# Patient Record
Sex: Female | Born: 1945 | Race: White | Hispanic: No | Marital: Single | Smoking: Former smoker
Health system: Southern US, Community
[De-identification: ages and names within clinical notes are randomized; demographics above are authoritative.]

## PROBLEM LIST (undated history)

## (undated) DIAGNOSIS — M51369 Other intervertebral disc degeneration, lumbar region without mention of lumbar back pain or lower extremity pain: Secondary | ICD-10-CM

## (undated) DIAGNOSIS — Z8719 Personal history of other diseases of the digestive system: Secondary | ICD-10-CM

## (undated) DIAGNOSIS — J309 Allergic rhinitis, unspecified: Secondary | ICD-10-CM

## (undated) DIAGNOSIS — M5136 Other intervertebral disc degeneration, lumbar region: Secondary | ICD-10-CM

## (undated) DIAGNOSIS — H35039 Hypertensive retinopathy, unspecified eye: Secondary | ICD-10-CM

## (undated) DIAGNOSIS — I499 Cardiac arrhythmia, unspecified: Secondary | ICD-10-CM

## (undated) DIAGNOSIS — E7800 Pure hypercholesterolemia, unspecified: Secondary | ICD-10-CM

## (undated) DIAGNOSIS — I4891 Unspecified atrial fibrillation: Secondary | ICD-10-CM

## (undated) DIAGNOSIS — M0520 Rheumatoid vasculitis with rheumatoid arthritis of unspecified site: Secondary | ICD-10-CM

## (undated) DIAGNOSIS — F419 Anxiety disorder, unspecified: Secondary | ICD-10-CM

## (undated) DIAGNOSIS — Z8489 Family history of other specified conditions: Secondary | ICD-10-CM

## (undated) DIAGNOSIS — R011 Cardiac murmur, unspecified: Secondary | ICD-10-CM

## (undated) DIAGNOSIS — I00 Rheumatic fever without heart involvement: Secondary | ICD-10-CM

## (undated) DIAGNOSIS — M549 Dorsalgia, unspecified: Secondary | ICD-10-CM

## (undated) DIAGNOSIS — H269 Unspecified cataract: Secondary | ICD-10-CM

## (undated) DIAGNOSIS — Z8711 Personal history of peptic ulcer disease: Secondary | ICD-10-CM

## (undated) DIAGNOSIS — I779 Disorder of arteries and arterioles, unspecified: Secondary | ICD-10-CM

## (undated) DIAGNOSIS — D649 Anemia, unspecified: Secondary | ICD-10-CM

## (undated) DIAGNOSIS — G4730 Sleep apnea, unspecified: Secondary | ICD-10-CM

## (undated) DIAGNOSIS — I739 Peripheral vascular disease, unspecified: Secondary | ICD-10-CM

## (undated) DIAGNOSIS — I2510 Atherosclerotic heart disease of native coronary artery without angina pectoris: Secondary | ICD-10-CM

## (undated) DIAGNOSIS — I10 Essential (primary) hypertension: Secondary | ICD-10-CM

## (undated) HISTORY — DX: Other intervertebral disc degeneration, lumbar region: M51.36

## (undated) HISTORY — DX: Hypertensive retinopathy, unspecified eye: H35.039

## (undated) HISTORY — DX: Anemia, unspecified: D64.9

## (undated) HISTORY — DX: Rheumatic fever without heart involvement: I00

## (undated) HISTORY — DX: Personal history of other diseases of the digestive system: Z87.19

## (undated) HISTORY — DX: Atherosclerotic heart disease of native coronary artery without angina pectoris: I25.10

## (undated) HISTORY — DX: Personal history of peptic ulcer disease: Z87.11

## (undated) HISTORY — DX: Rheumatoid vasculitis with rheumatoid arthritis of unspecified site: M05.20

## (undated) HISTORY — DX: Allergic rhinitis, unspecified: J30.9

## (undated) HISTORY — DX: Essential (primary) hypertension: I10

## (undated) HISTORY — DX: Dorsalgia, unspecified: M54.9

## (undated) HISTORY — DX: Unspecified cataract: H26.9

## (undated) HISTORY — DX: Other intervertebral disc degeneration, lumbar region without mention of lumbar back pain or lower extremity pain: M51.369

## (undated) HISTORY — DX: Pure hypercholesterolemia, unspecified: E78.00

## (undated) HISTORY — PX: TONSILLECTOMY: SUR1361

---
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
## 2004-10-05 HISTORY — PX: CHOLECYSTECTOMY: SHX55

## 2005-10-05 HISTORY — PX: HERNIA REPAIR: SHX51

## 2013-10-05 HISTORY — PX: HERNIA REPAIR: SHX51

## 2014-10-05 HISTORY — PX: REPLACEMENT TOTAL KNEE: SUR1224

## 2015-12-02 HISTORY — PX: REPLACEMENT TOTAL KNEE: SUR1224

## 2016-11-12 DIAGNOSIS — Z87891 Personal history of nicotine dependence: Secondary | ICD-10-CM | POA: Diagnosis not present

## 2016-11-12 DIAGNOSIS — M79601 Pain in right arm: Secondary | ICD-10-CM | POA: Diagnosis not present

## 2016-11-12 DIAGNOSIS — I10 Essential (primary) hypertension: Secondary | ICD-10-CM | POA: Diagnosis not present

## 2016-11-12 DIAGNOSIS — M79621 Pain in right upper arm: Secondary | ICD-10-CM | POA: Diagnosis not present

## 2016-11-16 DIAGNOSIS — M7981 Nontraumatic hematoma of soft tissue: Secondary | ICD-10-CM | POA: Diagnosis not present

## 2016-11-16 DIAGNOSIS — M79601 Pain in right arm: Secondary | ICD-10-CM | POA: Diagnosis not present

## 2016-11-18 DIAGNOSIS — M75121 Complete rotator cuff tear or rupture of right shoulder, not specified as traumatic: Secondary | ICD-10-CM | POA: Diagnosis not present

## 2016-11-18 DIAGNOSIS — M25511 Pain in right shoulder: Secondary | ICD-10-CM | POA: Diagnosis not present

## 2016-11-23 DIAGNOSIS — M25511 Pain in right shoulder: Secondary | ICD-10-CM | POA: Diagnosis not present

## 2016-11-23 DIAGNOSIS — M67411 Ganglion, right shoulder: Secondary | ICD-10-CM | POA: Diagnosis not present

## 2016-11-23 DIAGNOSIS — M75121 Complete rotator cuff tear or rupture of right shoulder, not specified as traumatic: Secondary | ICD-10-CM | POA: Diagnosis not present

## 2016-11-23 DIAGNOSIS — M19011 Primary osteoarthritis, right shoulder: Secondary | ICD-10-CM | POA: Diagnosis not present

## 2016-11-23 DIAGNOSIS — S46811A Strain of other muscles, fascia and tendons at shoulder and upper arm level, right arm, initial encounter: Secondary | ICD-10-CM | POA: Diagnosis not present

## 2016-11-23 DIAGNOSIS — M25411 Effusion, right shoulder: Secondary | ICD-10-CM | POA: Diagnosis not present

## 2016-11-24 DIAGNOSIS — S46011D Strain of muscle(s) and tendon(s) of the rotator cuff of right shoulder, subsequent encounter: Secondary | ICD-10-CM | POA: Diagnosis not present

## 2016-11-24 DIAGNOSIS — S46111D Strain of muscle, fascia and tendon of long head of biceps, right arm, subsequent encounter: Secondary | ICD-10-CM | POA: Diagnosis not present

## 2016-11-24 DIAGNOSIS — M25511 Pain in right shoulder: Secondary | ICD-10-CM | POA: Diagnosis not present

## 2016-12-03 DIAGNOSIS — S46011D Strain of muscle(s) and tendon(s) of the rotator cuff of right shoulder, subsequent encounter: Secondary | ICD-10-CM | POA: Diagnosis not present

## 2016-12-03 DIAGNOSIS — M25511 Pain in right shoulder: Secondary | ICD-10-CM | POA: Diagnosis not present

## 2016-12-03 DIAGNOSIS — S46111D Strain of muscle, fascia and tendon of long head of biceps, right arm, subsequent encounter: Secondary | ICD-10-CM | POA: Diagnosis not present

## 2016-12-09 DIAGNOSIS — M25511 Pain in right shoulder: Secondary | ICD-10-CM | POA: Diagnosis not present

## 2016-12-09 DIAGNOSIS — S46111D Strain of muscle, fascia and tendon of long head of biceps, right arm, subsequent encounter: Secondary | ICD-10-CM | POA: Diagnosis not present

## 2016-12-09 DIAGNOSIS — S46011D Strain of muscle(s) and tendon(s) of the rotator cuff of right shoulder, subsequent encounter: Secondary | ICD-10-CM | POA: Diagnosis not present

## 2016-12-10 DIAGNOSIS — S46111D Strain of muscle, fascia and tendon of long head of biceps, right arm, subsequent encounter: Secondary | ICD-10-CM | POA: Diagnosis not present

## 2016-12-10 DIAGNOSIS — M25511 Pain in right shoulder: Secondary | ICD-10-CM | POA: Diagnosis not present

## 2016-12-10 DIAGNOSIS — S46011D Strain of muscle(s) and tendon(s) of the rotator cuff of right shoulder, subsequent encounter: Secondary | ICD-10-CM | POA: Diagnosis not present

## 2016-12-15 DIAGNOSIS — M25511 Pain in right shoulder: Secondary | ICD-10-CM | POA: Diagnosis not present

## 2016-12-15 DIAGNOSIS — S46011D Strain of muscle(s) and tendon(s) of the rotator cuff of right shoulder, subsequent encounter: Secondary | ICD-10-CM | POA: Diagnosis not present

## 2016-12-15 DIAGNOSIS — S46111D Strain of muscle, fascia and tendon of long head of biceps, right arm, subsequent encounter: Secondary | ICD-10-CM | POA: Diagnosis not present

## 2016-12-16 DIAGNOSIS — M75121 Complete rotator cuff tear or rupture of right shoulder, not specified as traumatic: Secondary | ICD-10-CM | POA: Diagnosis not present

## 2016-12-16 DIAGNOSIS — M25511 Pain in right shoulder: Secondary | ICD-10-CM | POA: Diagnosis not present

## 2016-12-17 DIAGNOSIS — M25511 Pain in right shoulder: Secondary | ICD-10-CM | POA: Diagnosis not present

## 2016-12-17 DIAGNOSIS — S46111D Strain of muscle, fascia and tendon of long head of biceps, right arm, subsequent encounter: Secondary | ICD-10-CM | POA: Diagnosis not present

## 2016-12-17 DIAGNOSIS — S46011D Strain of muscle(s) and tendon(s) of the rotator cuff of right shoulder, subsequent encounter: Secondary | ICD-10-CM | POA: Diagnosis not present

## 2016-12-25 DIAGNOSIS — M25511 Pain in right shoulder: Secondary | ICD-10-CM | POA: Diagnosis not present

## 2016-12-25 DIAGNOSIS — S46011D Strain of muscle(s) and tendon(s) of the rotator cuff of right shoulder, subsequent encounter: Secondary | ICD-10-CM | POA: Diagnosis not present

## 2016-12-25 DIAGNOSIS — S46111D Strain of muscle, fascia and tendon of long head of biceps, right arm, subsequent encounter: Secondary | ICD-10-CM | POA: Diagnosis not present

## 2016-12-29 DIAGNOSIS — M25511 Pain in right shoulder: Secondary | ICD-10-CM | POA: Diagnosis not present

## 2016-12-29 DIAGNOSIS — S46011D Strain of muscle(s) and tendon(s) of the rotator cuff of right shoulder, subsequent encounter: Secondary | ICD-10-CM | POA: Diagnosis not present

## 2016-12-29 DIAGNOSIS — S46111D Strain of muscle, fascia and tendon of long head of biceps, right arm, subsequent encounter: Secondary | ICD-10-CM | POA: Diagnosis not present

## 2016-12-31 DIAGNOSIS — M25511 Pain in right shoulder: Secondary | ICD-10-CM | POA: Diagnosis not present

## 2016-12-31 DIAGNOSIS — S46011D Strain of muscle(s) and tendon(s) of the rotator cuff of right shoulder, subsequent encounter: Secondary | ICD-10-CM | POA: Diagnosis not present

## 2016-12-31 DIAGNOSIS — S46111D Strain of muscle, fascia and tendon of long head of biceps, right arm, subsequent encounter: Secondary | ICD-10-CM | POA: Diagnosis not present

## 2017-01-06 DIAGNOSIS — M25511 Pain in right shoulder: Secondary | ICD-10-CM | POA: Diagnosis not present

## 2017-01-06 DIAGNOSIS — S46111D Strain of muscle, fascia and tendon of long head of biceps, right arm, subsequent encounter: Secondary | ICD-10-CM | POA: Diagnosis not present

## 2017-01-06 DIAGNOSIS — S46011D Strain of muscle(s) and tendon(s) of the rotator cuff of right shoulder, subsequent encounter: Secondary | ICD-10-CM | POA: Diagnosis not present

## 2017-01-08 DIAGNOSIS — S46011D Strain of muscle(s) and tendon(s) of the rotator cuff of right shoulder, subsequent encounter: Secondary | ICD-10-CM | POA: Diagnosis not present

## 2017-01-08 DIAGNOSIS — M25511 Pain in right shoulder: Secondary | ICD-10-CM | POA: Diagnosis not present

## 2017-01-08 DIAGNOSIS — S46111D Strain of muscle, fascia and tendon of long head of biceps, right arm, subsequent encounter: Secondary | ICD-10-CM | POA: Diagnosis not present

## 2017-01-12 DIAGNOSIS — S46111D Strain of muscle, fascia and tendon of long head of biceps, right arm, subsequent encounter: Secondary | ICD-10-CM | POA: Diagnosis not present

## 2017-01-12 DIAGNOSIS — S46011D Strain of muscle(s) and tendon(s) of the rotator cuff of right shoulder, subsequent encounter: Secondary | ICD-10-CM | POA: Diagnosis not present

## 2017-01-12 DIAGNOSIS — M25511 Pain in right shoulder: Secondary | ICD-10-CM | POA: Diagnosis not present

## 2017-01-14 DIAGNOSIS — M25511 Pain in right shoulder: Secondary | ICD-10-CM | POA: Diagnosis not present

## 2017-01-14 DIAGNOSIS — S46011D Strain of muscle(s) and tendon(s) of the rotator cuff of right shoulder, subsequent encounter: Secondary | ICD-10-CM | POA: Diagnosis not present

## 2017-01-14 DIAGNOSIS — S46111D Strain of muscle, fascia and tendon of long head of biceps, right arm, subsequent encounter: Secondary | ICD-10-CM | POA: Diagnosis not present

## 2017-01-19 DIAGNOSIS — S46011D Strain of muscle(s) and tendon(s) of the rotator cuff of right shoulder, subsequent encounter: Secondary | ICD-10-CM | POA: Diagnosis not present

## 2017-01-19 DIAGNOSIS — M25511 Pain in right shoulder: Secondary | ICD-10-CM | POA: Diagnosis not present

## 2017-01-19 DIAGNOSIS — S46111D Strain of muscle, fascia and tendon of long head of biceps, right arm, subsequent encounter: Secondary | ICD-10-CM | POA: Diagnosis not present

## 2017-01-21 DIAGNOSIS — S46111D Strain of muscle, fascia and tendon of long head of biceps, right arm, subsequent encounter: Secondary | ICD-10-CM | POA: Diagnosis not present

## 2017-01-21 DIAGNOSIS — S46011D Strain of muscle(s) and tendon(s) of the rotator cuff of right shoulder, subsequent encounter: Secondary | ICD-10-CM | POA: Diagnosis not present

## 2017-01-21 DIAGNOSIS — M25511 Pain in right shoulder: Secondary | ICD-10-CM | POA: Diagnosis not present

## 2017-01-26 DIAGNOSIS — S46011D Strain of muscle(s) and tendon(s) of the rotator cuff of right shoulder, subsequent encounter: Secondary | ICD-10-CM | POA: Diagnosis not present

## 2017-01-26 DIAGNOSIS — S46111D Strain of muscle, fascia and tendon of long head of biceps, right arm, subsequent encounter: Secondary | ICD-10-CM | POA: Diagnosis not present

## 2017-01-26 DIAGNOSIS — M25511 Pain in right shoulder: Secondary | ICD-10-CM | POA: Diagnosis not present

## 2017-02-08 DIAGNOSIS — M25511 Pain in right shoulder: Secondary | ICD-10-CM | POA: Diagnosis not present

## 2017-02-08 DIAGNOSIS — S46111D Strain of muscle, fascia and tendon of long head of biceps, right arm, subsequent encounter: Secondary | ICD-10-CM | POA: Diagnosis not present

## 2017-02-08 DIAGNOSIS — S46011D Strain of muscle(s) and tendon(s) of the rotator cuff of right shoulder, subsequent encounter: Secondary | ICD-10-CM | POA: Diagnosis not present

## 2017-02-15 DIAGNOSIS — S46111D Strain of muscle, fascia and tendon of long head of biceps, right arm, subsequent encounter: Secondary | ICD-10-CM | POA: Diagnosis not present

## 2017-02-15 DIAGNOSIS — S46011D Strain of muscle(s) and tendon(s) of the rotator cuff of right shoulder, subsequent encounter: Secondary | ICD-10-CM | POA: Diagnosis not present

## 2017-02-15 DIAGNOSIS — M25511 Pain in right shoulder: Secondary | ICD-10-CM | POA: Diagnosis not present

## 2017-02-19 DIAGNOSIS — J019 Acute sinusitis, unspecified: Secondary | ICD-10-CM | POA: Diagnosis not present

## 2017-02-24 DIAGNOSIS — I10 Essential (primary) hypertension: Secondary | ICD-10-CM | POA: Diagnosis not present

## 2017-02-24 DIAGNOSIS — M75101 Unspecified rotator cuff tear or rupture of right shoulder, not specified as traumatic: Secondary | ICD-10-CM | POA: Diagnosis not present

## 2017-02-24 DIAGNOSIS — R635 Abnormal weight gain: Secondary | ICD-10-CM | POA: Diagnosis not present

## 2017-02-24 DIAGNOSIS — E785 Hyperlipidemia, unspecified: Secondary | ICD-10-CM | POA: Diagnosis not present

## 2017-02-24 DIAGNOSIS — R011 Cardiac murmur, unspecified: Secondary | ICD-10-CM | POA: Diagnosis not present

## 2017-02-24 DIAGNOSIS — Z1231 Encounter for screening mammogram for malignant neoplasm of breast: Secondary | ICD-10-CM | POA: Diagnosis not present

## 2017-03-03 DIAGNOSIS — I10 Essential (primary) hypertension: Secondary | ICD-10-CM | POA: Diagnosis not present

## 2017-03-03 DIAGNOSIS — R635 Abnormal weight gain: Secondary | ICD-10-CM | POA: Diagnosis not present

## 2017-03-03 DIAGNOSIS — E785 Hyperlipidemia, unspecified: Secondary | ICD-10-CM | POA: Diagnosis not present

## 2017-03-04 DIAGNOSIS — R062 Wheezing: Secondary | ICD-10-CM | POA: Diagnosis not present

## 2017-03-04 DIAGNOSIS — J019 Acute sinusitis, unspecified: Secondary | ICD-10-CM | POA: Diagnosis not present

## 2017-03-04 DIAGNOSIS — J209 Acute bronchitis, unspecified: Secondary | ICD-10-CM | POA: Diagnosis not present

## 2017-03-17 DIAGNOSIS — S01451A Open bite of right cheek and temporomandibular area, initial encounter: Secondary | ICD-10-CM | POA: Diagnosis not present

## 2017-03-17 DIAGNOSIS — W540XXA Bitten by dog, initial encounter: Secondary | ICD-10-CM | POA: Diagnosis not present

## 2017-03-17 DIAGNOSIS — S0083XA Contusion of other part of head, initial encounter: Secondary | ICD-10-CM | POA: Diagnosis not present

## 2017-03-20 DIAGNOSIS — S01451D Open bite of right cheek and temporomandibular area, subsequent encounter: Secondary | ICD-10-CM | POA: Diagnosis not present

## 2017-03-20 DIAGNOSIS — S0083XD Contusion of other part of head, subsequent encounter: Secondary | ICD-10-CM | POA: Diagnosis not present

## 2017-03-20 DIAGNOSIS — W540XXD Bitten by dog, subsequent encounter: Secondary | ICD-10-CM | POA: Diagnosis not present

## 2017-05-19 DIAGNOSIS — J028 Acute pharyngitis due to other specified organisms: Secondary | ICD-10-CM | POA: Diagnosis not present

## 2017-05-19 DIAGNOSIS — M545 Low back pain: Secondary | ICD-10-CM | POA: Diagnosis not present

## 2017-05-19 DIAGNOSIS — J069 Acute upper respiratory infection, unspecified: Secondary | ICD-10-CM | POA: Diagnosis not present

## 2017-06-16 DIAGNOSIS — Z23 Encounter for immunization: Secondary | ICD-10-CM | POA: Diagnosis not present

## 2017-09-20 DIAGNOSIS — R030 Elevated blood-pressure reading, without diagnosis of hypertension: Secondary | ICD-10-CM | POA: Diagnosis not present

## 2017-09-20 DIAGNOSIS — D508 Other iron deficiency anemias: Secondary | ICD-10-CM | POA: Diagnosis not present

## 2017-09-20 DIAGNOSIS — I10 Essential (primary) hypertension: Secondary | ICD-10-CM | POA: Diagnosis not present

## 2017-09-20 DIAGNOSIS — E7800 Pure hypercholesterolemia, unspecified: Secondary | ICD-10-CM | POA: Diagnosis not present

## 2017-09-20 DIAGNOSIS — E669 Obesity, unspecified: Secondary | ICD-10-CM | POA: Diagnosis not present

## 2017-09-20 DIAGNOSIS — M158 Other polyosteoarthritis: Secondary | ICD-10-CM | POA: Diagnosis not present

## 2017-09-20 DIAGNOSIS — D649 Anemia, unspecified: Secondary | ICD-10-CM | POA: Diagnosis not present

## 2017-10-06 ENCOUNTER — Other Ambulatory Visit: Payer: Self-pay | Admitting: Family Medicine

## 2017-10-06 DIAGNOSIS — Z1231 Encounter for screening mammogram for malignant neoplasm of breast: Secondary | ICD-10-CM

## 2017-10-26 ENCOUNTER — Encounter (INDEPENDENT_AMBULATORY_CARE_PROVIDER_SITE_OTHER): Payer: Self-pay

## 2017-10-26 ENCOUNTER — Ambulatory Visit
Admission: RE | Admit: 2017-10-26 | Discharge: 2017-10-26 | Disposition: A | Discharge status: Home / Self Care | Payer: Medicare Other | Source: Ambulatory Visit | Attending: Family Medicine | Admitting: Family Medicine

## 2017-10-26 DIAGNOSIS — Z1231 Encounter for screening mammogram for malignant neoplasm of breast: Secondary | ICD-10-CM

## 2017-11-24 DIAGNOSIS — E7800 Pure hypercholesterolemia, unspecified: Secondary | ICD-10-CM | POA: Diagnosis not present

## 2017-11-24 DIAGNOSIS — I10 Essential (primary) hypertension: Secondary | ICD-10-CM | POA: Diagnosis not present

## 2017-11-24 DIAGNOSIS — D563 Thalassemia minor: Secondary | ICD-10-CM | POA: Diagnosis not present

## 2017-11-24 DIAGNOSIS — M158 Other polyosteoarthritis: Secondary | ICD-10-CM | POA: Diagnosis not present

## 2018-01-31 ENCOUNTER — Ambulatory Visit
Admission: RE | Admit: 2018-01-31 | Discharge: 2018-01-31 | Disposition: A | Discharge status: Home / Self Care | Payer: Medicare Other | Source: Ambulatory Visit | Attending: Otolaryngology | Admitting: Otolaryngology

## 2018-01-31 ENCOUNTER — Other Ambulatory Visit: Payer: Self-pay | Admitting: Otolaryngology

## 2018-01-31 DIAGNOSIS — J320 Chronic maxillary sinusitis: Secondary | ICD-10-CM

## 2018-03-30 ENCOUNTER — Encounter: Payer: Self-pay | Admitting: Allergy and Immunology

## 2018-03-30 ENCOUNTER — Ambulatory Visit: Payer: Medicare Other | Admitting: Allergy and Immunology

## 2018-03-30 VITALS — BP 150/80 | HR 64 | Temp 97.9°F | Resp 16 | Ht 62.0 in | Wt 225.8 lb

## 2018-03-30 DIAGNOSIS — J3089 Other allergic rhinitis: Secondary | ICD-10-CM

## 2018-03-30 DIAGNOSIS — B999 Unspecified infectious disease: Secondary | ICD-10-CM | POA: Diagnosis not present

## 2018-03-30 NOTE — Patient Instructions (Addendum)
  1.  Allergen avoidance measures  2.  Treat and prevent inflammation:   A.  OTC Nasacort - 1 spray each nostril 1 time per day.  B.  Montelukast 10 mg - 1tablet 1 time per day  3.  If needed:   A.  Nasal saline  B.  OTC antihistamine -Allerest  D. OTC Mucinex DM - 1-2 tablets 2 times per day  4.  Blood - CBC w/diff, IgA/G/M  5. Further evaluation? Yes, if we recurrent problems  6. Return to clinic in 4 weeks or earlier if problem  7. Obtain fall flu vaccine

## 2018-03-30 NOTE — Progress Notes (Signed)
Dear Dr. Ernesto Rutherford,  Thank you for referring Kristina [name] to the [facility] on 03/30/2018.   Below is a summation of this patient's evaluation and recommendations.  Thank you for your referral. I will keep you informed about this patient's response to treatment.   If you have any questions please do not hesitate to contact me.   Sincerely,  Jiles Prows, MD Allergy / Immunology [facility]   ______________________________________________________________________    NEW PATIENT NOTE  Referring Provider: Thornell Sartorius, MD Primary Provider: Kathyrn Lass, MD Date of office visit: 03/30/2018    Subjective:   Chief Complaint:  Kristina [name] (DOB: 1946-04-27) is a 72 y.o. female who presents to the clinic on 03/30/2018 with a chief complaint of Sinus Problem (constant nasal congestion with recurrent sinus inf.) .     HPI: [name] presents to this clinic in evaluation of recurrent respiratory tract infections.  She informs me that she has had problems with "sinus" defined as extreme stuffiness and nasal congestion and ugly nasal discharge and a decreased ability to smell and coughing and chest tightness that can last several weeks requiring the administration of an antibiotic.  She has about 4 of these episodes per year.  In between these episodes she is chronically stuffy and has sneezing.  She does not appear to have a chronic cough or a significant amount of postnasal drip or throat clearing or raspy voice or chronic anosmia.  There is no obvious provoking factor giving rise to her respiratory tract symptoms.  She did smoke from the age of 41 to the age of 23 at 101 pack/day.  Chronic therapy has included the administration of over-the-counter Allerest and in the past she has use Claritin and Zyrtec.  She did have a history of heartburn in the past but that has resolved and she no  longer requires any therapy for this issue.  Past Medical History:  Diagnosis Date  . High blood pressure   . High cholesterol     Past Surgical History:  Procedure Laterality Date  . APPENDECTOMY  1955  . CHOLECYSTECTOMY  1963  . HERNIA REPAIR  2007  . HERNIA REPAIR  2015  . REPLACEMENT TOTAL KNEE  2016  . TONSILLECTOMY      Allergies as of 03/30/2018      Reactions   Penicillins Anaphylaxis, Itching      Medication List      ALLEREST MAXIMUM STRENGTH PO Take 1 tablet by mouth daily as needed.   hydrochlorothiazide 25 MG tablet Commonly known as:  HYDRODIURIL Take 25 mg by mouth daily.   losartan 100 MG tablet Commonly known as:  COZAAR Take 100 mg by mouth daily.   pravastatin 10 MG tablet Commonly known as:  PRAVACHOL Take 10 mg by mouth daily.       Review of systems negative except as noted in HPI / PMHx or noted below:  Review of Systems  Constitutional: Negative.   HENT: Negative.   Eyes: Negative.   Respiratory: Negative.   Cardiovascular: Negative.   Gastrointestinal: Negative.   Genitourinary: Negative.   Musculoskeletal: Negative.   Skin: Negative.   Neurological: Negative.   Endo/Heme/Allergies: Negative.   Psychiatric/Behavioral: Negative.     Family History  Problem Relation Age of Onset  . Kidney disease Mother   . Emphysema Father   . Asthma Father   . COPD Brother  Social History   Socioeconomic History  . Marital status: Unknown    Spouse name: Not on file  . Number of children: Not on file  . Years of education: Not on file  . Highest education level: Not on file  Occupational History  . Not on file  Social Needs  . Financial resource strain: Not on file  . Food insecurity:    Worry: Not on file    Inability: Not on file  . Transportation needs:    Medical: Not on file    Non-medical: Not on file  Tobacco Use  . Smoking status: Former Smoker    Packs/day: 1.50    Years: 30.00    Pack years: 45.00     Types: Cigarettes    Last attempt to quit: 2009    Years since quitting: 10.4  . Smokeless tobacco: Never Used  Substance and Sexual Activity  . Alcohol use: Yes    Comment: social  . Drug use: Never  . Sexual activity: Not on file  Lifestyle  . Physical activity:    Days per week: Not on file    Minutes per session: Not on file  . Stress: Not on file  Relationships  . Social connections:    Talks on phone: Not on file    Gets together: Not on file    Attends religious service: Not on file    Active member of club or organization: Not on file    Attends meetings of clubs or organizations: Not on file    Relationship status: Not on file  . Intimate partner violence:    Fear of current or ex partner: Not on file    Emotionally abused: Not on file    Physically abused: Not on file    Forced sexual activity: Not on file  Other Topics Concern  . Not on file  Social History Narrative  . Not on file    Environmental and Social history  Lives in a apartment with a dry environment, no animals located inside the household, dog exposure at her daughter's house and friends house, carpet in the bedroom, plastic on the bed, plastic on the pillow, and no smoking ongoing with inside the household.  She is a retired Engineer, materials.  Objective:   Vitals:   03/30/18 0931  BP: (!) 150/80  Pulse: 64  Resp: 16  Temp: 97.9 F (36.6 C)   Height: 5\' 2"  (157.5 cm) Weight: 225 lb 12.8 oz (102.4 kg)  Physical Exam  HENT:  Head: Normocephalic.  Right Ear: Tympanic membrane, external ear and ear canal normal.  Left Ear: Tympanic membrane, external ear and ear canal normal.  Nose: Mucosal edema present. No rhinorrhea.  Mouth/Throat: Uvula is midline, oropharynx is clear and moist and mucous membranes are normal. No oropharyngeal exudate.  Eyes: Conjunctivae are normal.  Neck: Trachea normal. No tracheal tenderness present. No tracheal deviation present. No thyromegaly present.   Cardiovascular: Normal rate, regular rhythm, S1 normal and S2 normal.  Murmur (Systolic murmur) heard. Pulmonary/Chest: Breath sounds normal. No stridor. No respiratory distress. She has no wheezes. She has no rales.  Musculoskeletal: She exhibits no edema.  Lymphadenopathy:       Head (right side): No tonsillar adenopathy present.       Head (left side): No tonsillar adenopathy present.    She has no cervical adenopathy.  Neurological: She is alert.  Skin: No rash noted. She is not diaphoretic. No erythema. Nails show no  clubbing.    Diagnostics: Allergy skin tests were performed.  She demonstrated hypersensitivity to house dust mite and cockroach  Spirometry was performed and demonstrated an FEV1 of 2.07 @ 103 % of predicted. FEV1/FVC = 0.76    Results of a sinus x-ray obtained 31 January 2018 identified the following:  There is increased density in the left maxillary sinus consistent with mucoperiosteal thickening. The additional visualized paranasal sinuses appear clear. No definite air-fluid levels or bone destruction. Multilevel cervical spondylosis noted.  Assessment and Plan:    1. Other allergic rhinitis   2. Recurrent infections     1.  Allergen avoidance measures  2.  Treat and prevent inflammation:   A.  OTC Nasacort - 1 spray each nostril 1 time per day.  B.  Montelukast 10 mg - 1tablet 1 time per day  3.  If needed:   A.  Nasal saline  B.  OTC antihistamine -Allerest  D. OTC Mucinex DM - 1-2 tablets 2 times per day  4.  Blood - CBC w/diff, IgA/G/M  5. Further evaluation? Yes, if we recurrent problems  6. Return to clinic in 4 weeks or earlier if problem  7. Obtain fall flu vaccine  I will have [name] perform allergen avoidance measures against dust mite and have her use anti-inflammatory agents for her airway in a preventative manner and screen her blood to make sure that she can produce adequate classes of immunoglobulin and I will see her back in this  clinic in 4 weeks to assess her response to this approach.  She will contact me during the interval should there be a problem.  Jiles Prows, MD Allergy / Immunology [facility]

## 2018-03-31 ENCOUNTER — Encounter: Payer: Self-pay | Admitting: Allergy and Immunology

## 2018-03-31 MED ORDER — MONTELUKAST SODIUM 10 MG PO TABS: 10.0000 mg | ORAL_TABLET | Freq: Every day | ORAL | 5 refills | Status: DC

## 2018-04-01 NOTE — Addendum Note (Signed)
Addended by: Isabel Caprice on: 04/01/2018 09:27 AM   Modules accepted: Orders

## 2018-04-02 LAB — CBC WITH DIFFERENTIAL/PLATELET
BASOS ABS: 0 10*3/uL (ref 0.0–0.2)
Basos: 0 %
EOS (ABSOLUTE): 0.3 10*3/uL (ref 0.0–0.4)
EOS: 5 %
HEMOGLOBIN: 11.5 g/dL (ref 11.1–15.9)
Hematocrit: 37.5 % (ref 34.0–46.6)
Immature Grans (Abs): 0 10*3/uL (ref 0.0–0.1)
Immature Granulocytes: 0 %
LYMPHS ABS: 2.2 10*3/uL (ref 0.7–3.1)
Lymphs: 36 %
MCH: 20.1 pg — ABNORMAL LOW (ref 26.6–33.0)
MCHC: 30.7 g/dL — AB (ref 31.5–35.7)
MCV: 66 fL — ABNORMAL LOW (ref 79–97)
MONOCYTES: 12 %
MONOS ABS: 0.8 10*3/uL (ref 0.1–0.9)
Neutrophils Absolute: 2.9 10*3/uL (ref 1.4–7.0)
Neutrophils: 47 %
Platelets: 292 10*3/uL (ref 150–450)
RBC: 5.71 x10E6/uL — ABNORMAL HIGH (ref 3.77–5.28)
RDW: 18.6 % — AB (ref 12.3–15.4)
WBC: 6.2 10*3/uL (ref 3.4–10.8)

## 2018-04-02 LAB — IGG, IGA, IGM
IgA/Immunoglobulin A, Serum: 380 mg/dL (ref 64–422)
IgG (Immunoglobin G), Serum: 906 mg/dL (ref 700–1600)
IgM (Immunoglobulin M), Srm: 46 mg/dL (ref 26–217)

## 2018-05-03 ENCOUNTER — Ambulatory Visit: Payer: Medicare Other | Admitting: Allergy and Immunology

## 2018-05-03 ENCOUNTER — Encounter: Payer: Self-pay | Admitting: Allergy and Immunology

## 2018-05-03 VITALS — BP 148/72 | HR 60 | Resp 16

## 2018-05-03 DIAGNOSIS — J3089 Other allergic rhinitis: Secondary | ICD-10-CM | POA: Diagnosis not present

## 2018-05-03 DIAGNOSIS — B999 Unspecified infectious disease: Secondary | ICD-10-CM | POA: Diagnosis not present

## 2018-05-03 NOTE — Patient Instructions (Addendum)
  1.  Continue to perform Allergen avoidance measures  2.  Continue to Treat and prevent inflammation:   A.  OTC Nasacort - 1 spray each nostril 3-7 times per week  B.  Montelukast 10 mg - 1 tablet 1 time per day  3.  If needed:   A.  Nasal saline  B.  OTC antihistamine -Allerest  D. OTC Mucinex DM - 1-2 tablets 2 times per day  4. Return to clinic in 1 year or earlier if problem  5. Obtain fall flu vaccine

## 2018-05-03 NOTE — Progress Notes (Signed)
Follow-up Note  Referring Provider: Kathyrn Lass, MD Primary Provider: Kathyrn Lass, MD Date of Office Visit: 05/03/2018  Subjective:   Kristina [name] (DOB: 30-Jul-1946) is a 72 y.o. female who returns to the [facility] on 05/03/2018 in re-evaluation of the following:  HPI: Kristina [name] returns to this clinic in reevaluation of her allergic rhinitis and recurrent infections addressed during her initial evaluation of 30 March 2018.  She has performed house dust avoidance measures and has been consistently using Nasacort and montelukast and feels great.  All of her upper airway symptoms have completely abated.  Allergies as of 05/03/2018      Reactions   Penicillins Anaphylaxis, Itching      Medication List      ALLEREST MAXIMUM STRENGTH PO Take 1 tablet by mouth daily as needed.   hydrochlorothiazide 25 MG tablet Commonly known as:  HYDRODIURIL Take 25 mg by mouth daily.   losartan 100 MG tablet Commonly known as:  COZAAR Take 100 mg by mouth daily.   montelukast 10 MG tablet Commonly known as:  SINGULAIR Take 1 tablet (10 mg total) by mouth at bedtime.   NASACORT ALLERGY 24HR 55 MCG/ACT Aero nasal inhaler Generic drug:  triamcinolone Place 1 spray into the nose daily.   pravastatin 10 MG tablet Commonly known as:  PRAVACHOL Take 10 mg by mouth daily.       Past Medical History:  Diagnosis Date  . Allergic rhinitis   . High blood pressure   . High cholesterol     Past Surgical History:  Procedure Laterality Date  . APPENDECTOMY  1955  . CHOLECYSTECTOMY  1963  . HERNIA REPAIR  2007  . HERNIA REPAIR  2015  . REPLACEMENT TOTAL KNEE  2016  . TONSILLECTOMY      Review of systems negative except as noted in HPI / PMHx or noted below:  Review of Systems  Constitutional: Negative.   HENT: Negative.   Eyes: Negative.   Respiratory: Negative.   Cardiovascular: Negative.   Gastrointestinal: Negative.   Genitourinary: Negative.     Musculoskeletal: Negative.   Skin: Negative.   Neurological: Negative.   Endo/Heme/Allergies: Negative.   Psychiatric/Behavioral: Negative.      Objective:   Vitals:   05/03/18 1028  BP: (!) 148/72  Pulse: 60  Resp: 16          Physical Exam  HENT:  Head: Normocephalic.  Right Ear: Tympanic membrane, external ear and ear canal normal.  Left Ear: Tympanic membrane, external ear and ear canal normal.  Nose: Nose normal. No mucosal edema or rhinorrhea.  Mouth/Throat: Uvula is midline, oropharynx is clear and moist and mucous membranes are normal. No oropharyngeal exudate.  Eyes: Conjunctivae are normal.  Neck: Trachea normal. No tracheal tenderness present. No tracheal deviation present. No thyromegaly present.  Cardiovascular: Normal rate, regular rhythm, S1 normal, S2 normal and normal heart sounds.  No murmur heard. Pulmonary/Chest: Breath sounds normal. No stridor. No respiratory distress. She has no wheezes. She has no rales.  Musculoskeletal: She exhibits no edema.  Lymphadenopathy:       Head (right side): No tonsillar adenopathy present.       Head (left side): No tonsillar adenopathy present.    She has no cervical adenopathy.  Neurological: She is alert.  Skin: No rash noted. She is not diaphoretic. No erythema. Nails show no clubbing.    Diagnostics:   Results of blood tests obtained 01 April 2018 identify IgG 906  mg/DL, IgM 46 mg/DL, IgA 380 mg/DL, WBC 6.2, absolute eosinophil 300, absolute lymphocyte 2200, hemoglobin 11.5, platelet 292.  Assessment and Plan:   1. Other allergic rhinitis   2. Recurrent infections      1.  Continue to perform Allergen avoidance measures  2.  Continue to Treat and prevent inflammation:   A.  OTC Nasacort - 1 spray each nostril 3-7 times per week  B.  Montelukast 10 mg - 1 tablet 1 time per day  3.  If needed:   A.  Nasal saline  B.  OTC antihistamine -Allerest  D. OTC Mucinex DM - 1-2 tablets 2 times per day  4.  Return to clinic in 1 year or earlier if problem  5. Obtain fall flu vaccine  Overall Kristina [name] is doing very well with a combination of allergen avoidance measures and the use of anti-inflammatory agents for her respiratory tract.  I will now see her back in this clinic in a year or earlier if there is a problem.  [name], MD Allergy / Immunology [place]

## 2018-05-04 ENCOUNTER — Encounter: Payer: Self-pay | Admitting: Allergy and Immunology

## 2018-10-12 ENCOUNTER — Other Ambulatory Visit: Payer: Self-pay | Admitting: Allergy and Immunology

## 2018-10-12 MED ORDER — MONTELUKAST SODIUM 10 MG PO TABS: 10.0000 mg | ORAL_TABLET | Freq: Every day | ORAL | 6 refills | Status: DC

## 2018-10-12 NOTE — Telephone Encounter (Signed)
Patient called pharmacy for a refill for Montelukast, but it was refused. She was seen 05-03-2018. She stated Dr. Neldon Mc said she only had to come once a year, so she doesn't know why it was denied. She is requesting a refill. [place] [address]

## 2018-10-12 NOTE — Telephone Encounter (Signed)
Refilled Montelukast x6 to [facility].

## 2018-10-13 ENCOUNTER — Other Ambulatory Visit: Payer: Self-pay | Admitting: Family Medicine

## 2018-10-13 DIAGNOSIS — Z1231 Encounter for screening mammogram for malignant neoplasm of breast: Secondary | ICD-10-CM

## 2018-11-09 ENCOUNTER — Ambulatory Visit
Admission: RE | Admit: 2018-11-09 | Discharge: 2018-11-09 | Disposition: A | Discharge status: Home / Self Care | Payer: Medicare Other | Source: Ambulatory Visit | Attending: Family Medicine | Admitting: Family Medicine

## 2018-11-09 DIAGNOSIS — Z1231 Encounter for screening mammogram for malignant neoplasm of breast: Secondary | ICD-10-CM

## 2018-11-21 DIAGNOSIS — M549 Dorsalgia, unspecified: Secondary | ICD-10-CM

## 2018-11-21 DIAGNOSIS — G8929 Other chronic pain: Secondary | ICD-10-CM | POA: Insufficient documentation

## 2018-12-01 ENCOUNTER — Encounter (INDEPENDENT_AMBULATORY_CARE_PROVIDER_SITE_OTHER): Payer: Medicare Other

## 2018-12-14 ENCOUNTER — Encounter (INDEPENDENT_AMBULATORY_CARE_PROVIDER_SITE_OTHER): Payer: Self-pay | Admitting: Bariatrics

## 2018-12-15 ENCOUNTER — Encounter (INDEPENDENT_AMBULATORY_CARE_PROVIDER_SITE_OTHER): Payer: Self-pay | Admitting: Bariatrics

## 2018-12-15 ENCOUNTER — Ambulatory Visit (INDEPENDENT_AMBULATORY_CARE_PROVIDER_SITE_OTHER): Payer: Medicare Other | Admitting: Bariatrics

## 2018-12-15 ENCOUNTER — Other Ambulatory Visit: Payer: Self-pay

## 2018-12-15 VITALS — BP 152/80 | HR 67 | Temp 97.9°F | Ht 62.0 in | Wt 222.0 lb

## 2018-12-15 DIAGNOSIS — R0602 Shortness of breath: Secondary | ICD-10-CM | POA: Diagnosis not present

## 2018-12-15 DIAGNOSIS — Z6841 Body Mass Index (BMI) 40.0 and over, adult: Secondary | ICD-10-CM

## 2018-12-15 DIAGNOSIS — Z9189 Other specified personal risk factors, not elsewhere classified: Secondary | ICD-10-CM

## 2018-12-15 DIAGNOSIS — M549 Dorsalgia, unspecified: Secondary | ICD-10-CM

## 2018-12-15 DIAGNOSIS — R5383 Other fatigue: Secondary | ICD-10-CM | POA: Diagnosis not present

## 2018-12-15 DIAGNOSIS — E559 Vitamin D deficiency, unspecified: Secondary | ICD-10-CM

## 2018-12-15 DIAGNOSIS — E7849 Other hyperlipidemia: Secondary | ICD-10-CM

## 2018-12-15 DIAGNOSIS — R7309 Other abnormal glucose: Secondary | ICD-10-CM

## 2018-12-15 DIAGNOSIS — Z1331 Encounter for screening for depression: Secondary | ICD-10-CM | POA: Diagnosis not present

## 2018-12-15 DIAGNOSIS — I10 Essential (primary) hypertension: Secondary | ICD-10-CM | POA: Diagnosis not present

## 2018-12-15 DIAGNOSIS — G8929 Other chronic pain: Secondary | ICD-10-CM

## 2018-12-15 DIAGNOSIS — Z0289 Encounter for other administrative examinations: Secondary | ICD-10-CM

## 2018-12-15 NOTE — Progress Notes (Signed)
.  Office: [phone]  /  Fax: [phone]   HPI:   Chief Complaint: OBESITY  Kristina [name] (MR# [id number]) is a 73 y.o. female who presents on 12/15/2018 for obesity evaluation and treatment. Current BMI is Body mass index is 40.6 kg/m.[name] has struggled with obesity for years and has been unsuccessful in either losing weight or maintaining long term weight loss. Armandina attended our information session and states she is currently in the action stage of change and ready to dedicate time achieving and maintaining a healthier weight.  [name] states her family eats meals together her desired weight loss is 62 lbs. she has been heavy most of her life she started gaining weight in 2017 her heaviest weight ever was 341 lbs. she does like to cook she craves sweets she does not like "[place]" she describes her worst habit as over eating  she snacks frequently in the evenings she is frequently drinking liquids with calories she sometimes makes poor food choices she sometimes eats larger portions than normal  she struggles with emotional eating    [name] feels her energy is lower than it should be. This has worsened with weight gain and has not worsened recently. [name] admits to daytime somnolence and she admits to waking up still tired. Patient is at risk for obstructive sleep apnea. Patent has a history of symptoms of daytime fatigue, morning fatigue and hypertension. Patient generally gets 5 or 6 hours of sleep per night, and states they generally have restless sleep. Snoring is present. Apneic episodes are not present. Epworth Sleepiness Score is 7  Dyspnea on exertion [name] notes increasing shortness of breath with certain activities (climbing stairs) and seems to be worsening over time with weight gain. She notes getting out of breath sooner with activity than she used to. This has not gotten worse recently. [name] denies orthopnea.   Hypertension [name] is a 73 y.o. female with hypertension. She is currently taking Losartan and HCTZ. She did not take her blood pressure medications. [name] denies chest pain. She is working weight loss to help control her blood pressure with the goal of decreasing her risk of heart attack and stroke. [name] blood pressure is not well controlled.  Hyperlipidemia [name] has hyperlipidemia and she is currently taking pravastatin and pravachol. She is attempting to improve her cholesterol levels with intensive lifestyle modification including a low saturated fat diet, exercise and weight loss. She denies myalgias.  At risk for cardiovascular disease [name] is at a higher than average risk for cardiovascular disease due to obesity, hypertension and hyperlipidemia. She currently denies any chest pain.  Chronic Back Pain [name] has chronic back pain. Her chronic back pain is "not severe per patient". She plans to get epidural injection next week.  Vitamin D deficiency [name] has a diagnosis of vitamin D deficiency. She is currently taking multi-vitamin, B12 supplement, vitamin D supplement and vitamin C supplement. She denies nausea, vomiting or muscle weakness.  Elevated Glucose  [name] has a history of some elevated blood glucose readings without a diagnosis of diabetes. She admits to polyphagia.  Depression Screen [name]'s Food and Mood (modified PHQ-9) score was  Depression screen PHQ 2/9 12/15/2018  Decreased Interest 2  Down, Depressed, Hopeless 1  PHQ - 2 Score 3  Tired, decreased energy 2  Change in appetite 1  Feeling bad or failure about yourself  1  Trouble concentrating 1  Moving slowly or fidgety/restless 0  Suicidal thoughts 0  Difficult doing  work/chores Not difficult at all    ASSESSMENT AND PLAN:  Other fatigue  SOB (shortness of breath) on exertion  Essential hypertension - Plan: Comprehensive metabolic panel  Other  hyperlipidemia - Plan: Hemoglobin A1c, Insulin, random, Lipid Panel With LDL/HDL Ratio  Chronic back pain, unspecified back location, unspecified back pain laterality  Vitamin D deficiency - Plan: VITAMIN D 25 Hydroxy (Vit-D Deficiency, Fractures)  Elevated glucose - Plan: Hemoglobin A1c, Insulin, random  Depression screening  At risk for heart disease  Class 3 severe obesity with serious comorbidity and body mass index (BMI) of 40.0 to 44.9 in adult, unspecified obesity type (HCC)  PLAN:  Fatigue [name] was informed that her fatigue may be related to obesity, depression or many other causes. Labs will be ordered, and in the meanwhile [name] has agreed to work on diet, exercise and weight loss to help with fatigue. Proper sleep hygiene was discussed including the need for 7-8 hours of quality sleep each night. A sleep study was not ordered based on symptoms and Epworth score.  Dyspnea on exertion [name]'s shortness of breath appears to be obesity related and exercise induced. She has agreed to work on weight loss and gradually increase exercise to treat her exercise induced shortness of breath. [name] will do no pounding exercises. If [name] follows our instructions and loses weight without improvement of her shortness of breath, we will plan to refer to pulmonology. We will monitor this condition regularly. [name] agrees to this plan.  Hypertension We discussed sodium restriction, working on healthy weight loss, and a regular exercise program as the means to achieve improved blood pressure control. [name] agreed with this plan and agreed to follow up as directed. We will continue to monitor her blood pressure as well as her progress with the above lifestyle modifications. She will continue her medications as prescribed and will watch for signs of hypotension as she continues her lifestyle modifications.  Hyperlipidemia Dannia was informed of the American Heart  Association Guidelines emphasizing intensive lifestyle modifications as the first line treatment for hyperlipidemia. We discussed many lifestyle modifications today in depth, and [name] will start to work on decreasing saturated fats such as fatty red meat, butter and many fried foods. She will also increase vegetables and lean protein in her diet and start to work on exercise and weight loss efforts. Vivica will continue her medications and follow up with our clinic at the agreed upon time.  Cardiovascular risk counseling Annaliah was given extended (15 minutes) coronary artery disease prevention counseling today. She is 73 y.o. female and has risk factors for heart disease including obesity, hypertension and hyperlipidemia. We discussed intensive lifestyle modifications today with an emphasis on specific weight loss instructions and strategies. Pt was also informed of the importance of increasing exercise and decreasing saturated fats to help prevent heart disease.  Chronic Back Pain Kyannah will gradually increase exercise to help with her chronic back pain. She agrees to follow up with our clinic in 2 weeks.  Vitamin D Deficiency Kihanna was informed that low vitamin D levels contributes to fatigue and are associated with obesity, breast, and colon cancer. She will continue her multi-vitamin and vitamin B12, vit D and vitamin C supplements and will follow up for routine testing of vitamin D, at least 2-3 times per year. She was informed of the risk of over-replacement of vitamin D and agrees to not increase her dose unless she discusses this with [name] first. We will check vitamin D level today.  Elevated Glucose Fasting labs will be obtained (Hgb A1c, insulin level) and results with be discussed with [name] in 2 weeks at her follow up visit. In the meanwhile Kadeshia was started on a lower simple carbohydrate diet and will work on weight loss efforts.  Depression Screen Paticia had  a mildly positive depression screening. Depression is commonly associated with obesity and often results in emotional eating behaviors. We will monitor this closely and work on CBT to help improve the non-hunger eating patterns. Referral to Psychology may be required if no improvement is seen as she continues in our clinic.  Obesity Emmilyn is currently in the action stage of change and her goal is to continue with weight loss efforts She has agreed to follow the Category 3 plan Lilyona will go to the gym at her apartment complex to exercise for weight loss and overall health benefits. We discussed the following Behavioral Modification Strategies today: increase H2O intake, no skipping meals, keeping healthy foods in the home, increasing lean protein intake, decreasing simple carbohydrates, increasing vegetables, decrease eating out and work on meal planning and easy cooking plans  Matteson has agreed to follow up with our clinic in 2 weeks. She was informed of the importance of frequent follow up visits to maximize her success with intensive lifestyle modifications for her multiple health conditions. She was informed we would discuss her lab results at her next visit unless there is a critical issue that needs to be addressed sooner. Manjot agreed to keep her next visit at the agreed upon time to discuss these results.  ALLERGIES: Allergies  Allergen Reactions  . Penicillins Anaphylaxis and Itching    MEDICATIONS: Current Outpatient Medications on File Prior to Visit  Medication Sig Dispense Refill  . cyclobenzaprine (FLEXERIL) 10 MG tablet Take 10 mg by mouth 3 (three) times daily as needed for muscle spasms.    . hydrochlorothiazide (HYDRODIURIL) 25 MG tablet Take 25 mg by mouth daily.    [name] losartan (COZAAR) 100 MG tablet Take 100 mg by mouth daily.    . montelukast (SINGULAIR) 10 MG tablet Take 1 tablet (10 mg total) by mouth at bedtime. 30 tablet 6  . Multiple Vitamin  (MULTIVITAMIN) capsule Take 1 capsule by mouth daily.    . Multiple Vitamins-Minerals (AIRBORNE GUMMIES PO) Take by mouth.    . pravastatin (PRAVACHOL) 10 MG tablet Take 10 mg by mouth daily.    . Chlorpheniramine-Pseudoeph (ALLEREST MAXIMUM STRENGTH PO) Take 1 tablet by mouth daily as needed.    . triamcinolone (NASACORT ALLERGY 24HR) 55 MCG/ACT AERO nasal inhaler Place 1 spray into the nose daily.     No current facility-administered medications on file prior to visit.     PAST MEDICAL HISTORY: Past Medical History:  Diagnosis Date  . Allergic rhinitis   . Anemia glaucoma  . Back pain   . DDD (degenerative disc disease), lumbar   . High blood pressure   . High cholesterol   . History of stomach ulcers   . Rheumatic fever   . Rheumatoid arteritis ([place])     PAST SURGICAL HISTORY: Past Surgical History:  Procedure Laterality Date  . APPENDECTOMY  1955  . CHOLECYSTECTOMY  1963  . HERNIA REPAIR  2007  . HERNIA REPAIR  2015  . REPLACEMENT TOTAL KNEE  2016  . TONSILLECTOMY      SOCIAL HISTORY: Social History   Tobacco Use  . Smoking status: Former Smoker    Packs/day: 1.50    Years: 30.00  Pack years: 45.00    Types: Cigarettes    Last attempt to quit: 2009    Years since quitting: 11.2  . Smokeless tobacco: Never Used  Substance Use Topics  . Alcohol use: Yes    Comment: social  . Drug use: Never    FAMILY HISTORY: Family History  Problem Relation Age of Onset  . Kidney disease Mother   . Hypertension Mother   . Emphysema Father   . Asthma Father   . Hypertension Father   . Heart disease Father   . COPD Brother     ROS: Review of Systems  Constitutional: Positive for malaise/fatigue.  HENT: Positive for congestion (nasal stuffiness).        + Dentures  Eyes:       + Wear Glasses or Contacts + Floaters  Respiratory: Positive for shortness of breath (on exertion).   Cardiovascular: Negative for chest pain and orthopnea.       + Leg Cramping   Gastrointestinal: Positive for heartburn. Negative for nausea and vomiting.  Genitourinary: Positive for frequency.  Musculoskeletal: Positive for back pain.       + Neck Stiffness + Muscle or Joint Pain  Endo/Heme/Allergies: Bruises/bleeds easily (bruising).       + Excessive Hunger  Psychiatric/Behavioral: The patient has insomnia.     PHYSICAL EXAM: Blood pressure (!) 152/80, pulse 67, temperature 97.9 F (36.6 C), temperature source Oral, height 5\' 2"  (1.575 m), weight 222 lb (100.7 kg), SpO2 98 %. Body mass index is 40.6 kg/m. Physical Exam Vitals signs reviewed.  Constitutional:      Appearance: Normal appearance. She is well-developed. She is obese.  HENT:     Head: Normocephalic and atraumatic.     Nose: Nose normal.     Mouth/Throat:     Comments: Mallampati = 3 Eyes:     General: No scleral icterus.    Extraocular Movements: Extraocular movements intact.  Neck:     Musculoskeletal: Normal range of motion and neck supple.     Thyroid: No thyromegaly.  Cardiovascular:     Rate and Rhythm: Normal rate and regular rhythm.  Pulmonary:     Effort: Pulmonary effort is normal. No respiratory distress.  Abdominal:     Palpations: Abdomen is soft.     Tenderness: There is no abdominal tenderness.  Musculoskeletal: Normal range of motion.     Comments: Range of Motion normal in all 4 extremities  Skin:    General: Skin is warm and dry.  Neurological:     Mental Status: She is alert and oriented to person, place, and time.     Coordination: Coordination normal.  Psychiatric:        Mood and Affect: Mood normal.        Behavior: Behavior normal.     RECENT LABS AND TESTS: BMET No results found for: NA, K, CL, CO2, GLUCOSE, BUN, CREATININE, CALCIUM, GFRNONAA, GFRAA No results found for: HGBA1C No results found for: INSULIN CBC    Component Value Date/Time   WBC 6.2 04/01/2018 0857   RBC 5.71 (H) 04/01/2018 0857   HGB 11.5 04/01/2018 0857   HCT 37.5 04/01/2018  0857   PLT 292 04/01/2018 0857   MCV 66 (L) 04/01/2018 0857   MCH 20.1 (L) 04/01/2018 0857   MCHC 30.7 (L) 04/01/2018 0857   RDW 18.6 (H) 04/01/2018 0857   LYMPHSABS 2.2 04/01/2018 0857   EOSABS 0.3 04/01/2018 0857   BASOSABS 0.0 04/01/2018 0857   Iron/TIBC/Ferritin/ %  Sat No results found for: IRON, TIBC, FERRITIN, IRONPCTSAT Lipid Panel  No results found for: CHOL, TRIG, HDL, CHOLHDL, VLDL, LDLCALC, LDLDIRECT Hepatic Function Panel  No results found for: PROT, ALBUMIN, AST, ALT, ALKPHOS, BILITOT, BILIDIR, IBILI No results found for: TSH Vitamin D There are no recent lab results  INDIRECT CALORIMETER done today shows a VO2 of 292 and a REE of 2036. Her calculated basal metabolic rate is 3716 thus her basal metabolic rate is better than expected.   OBESITY BEHAVIORAL INTERVENTION VISIT  Today's visit was # 1   Starting weight: 222 lbs Starting date: 12/15/2018 Today's weight : 222 lbs Today's date: 12/15/2018 Total lbs lost to date: 0 At least 15 minutes were spent on discussing the following behavioral intervention visit.    12/15/2018  Height 5\' 2"  (1.575 m)  Weight 222 lb (100.7 kg)  BMI (Calculated) 40.59  BLOOD PRESSURE - SYSTOLIC 967  BLOOD PRESSURE - DIASTOLIC 80  Waist Measurement  43 inches   Body Fat % 52.5 %  Total Body Water (lbs) 80.4 lbs  RMR 2036    ASK: We discussed the diagnosis of obesity with [name] today and Annistyn agreed to give [name] permission to discuss obesity behavioral modification therapy today.  ASSESS: Klee has the diagnosis of obesity and her BMI today is 40.59 Nabeeha is in the action stage of change   ADVISE: Kenadi was educated on the multiple health risks of obesity as well as the benefit of weight loss to improve her health. She was advised of the need for long term treatment and the importance of lifestyle modifications to improve her current health and to decrease her risk of future health problems.   AGREE: Multiple dietary modification options and treatment options were discussed and  Evaluna agreed to follow the recommendations documented in the above note.  ARRANGE: Jeny was educated on the importance of frequent visits to treat obesity as outlined per CMS and USPSTF guidelines and agreed to schedule her next follow up appointment today.   Corey Skains, am acting as Location manager for [employer]. Owens Shark, DO  I have reviewed the above documentation for accuracy and completeness, and I agree with the above. -Jearld Lesch, DO

## 2018-12-16 LAB — COMPREHENSIVE METABOLIC PANEL
ALT: 24 IU/L (ref 0–32)
AST: 24 IU/L (ref 0–40)
Albumin/Globulin Ratio: 1.7 (ref 1.2–2.2)
Albumin: 4.3 g/dL (ref 3.7–4.7)
Alkaline Phosphatase: 70 IU/L (ref 39–117)
BUN/Creatinine Ratio: 11 — ABNORMAL LOW (ref 12–28)
BUN: 7 mg/dL — AB (ref 8–27)
Bilirubin Total: 0.5 mg/dL (ref 0.0–1.2)
CO2: 25 mmol/L (ref 20–29)
Calcium: 9.5 mg/dL (ref 8.7–10.3)
Chloride: 99 mmol/L (ref 96–106)
Creatinine, Ser: 0.63 mg/dL (ref 0.57–1.00)
GFR calc Af Amer: 104 mL/min/{1.73_m2} (ref >59)
GFR calc non Af Amer: 90 mL/min/{1.73_m2} (ref >59)
GLUCOSE: 94 mg/dL (ref 65–99)
Globulin, Total: 2.6 g/dL (ref 1.5–4.5)
Potassium: 4.6 mmol/L (ref 3.5–5.2)
Sodium: 140 mmol/L (ref 134–144)
Total Protein: 6.9 g/dL (ref 6.0–8.5)

## 2018-12-16 LAB — LIPID PANEL WITH LDL/HDL RATIO
CHOLESTEROL TOTAL: 223 mg/dL — AB (ref 100–199)
HDL: 60 mg/dL (ref >39)
LDL Calculated: 129 mg/dL — ABNORMAL HIGH (ref 0–99)
LDl/HDL Ratio: 2.2 ratio (ref 0.0–3.2)
Triglycerides: 170 mg/dL — ABNORMAL HIGH (ref 0–149)
VLDL Cholesterol Cal: 34 mg/dL (ref 5–40)

## 2018-12-16 LAB — HEMOGLOBIN A1C
Est. average glucose Bld gHb Est-mCnc: 111 mg/dL
HEMOGLOBIN A1C: 5.5 % (ref 4.8–5.6)

## 2018-12-16 LAB — INSULIN, RANDOM: INSULIN: 7.1 u[IU]/mL (ref 2.6–24.9)

## 2018-12-16 LAB — VITAMIN D 25 HYDROXY (VIT D DEFICIENCY, FRACTURES): Vit D, 25-Hydroxy: 31.8 ng/mL (ref 30.0–100.0)

## 2018-12-29 ENCOUNTER — Ambulatory Visit (INDEPENDENT_AMBULATORY_CARE_PROVIDER_SITE_OTHER): Payer: Medicare Other | Admitting: Bariatrics

## 2018-12-29 ENCOUNTER — Encounter (INDEPENDENT_AMBULATORY_CARE_PROVIDER_SITE_OTHER): Payer: Self-pay | Admitting: Bariatrics

## 2018-12-29 ENCOUNTER — Other Ambulatory Visit: Payer: Self-pay

## 2018-12-29 DIAGNOSIS — I10 Essential (primary) hypertension: Secondary | ICD-10-CM | POA: Diagnosis not present

## 2018-12-29 DIAGNOSIS — E7849 Other hyperlipidemia: Secondary | ICD-10-CM | POA: Diagnosis not present

## 2018-12-29 DIAGNOSIS — Z6841 Body Mass Index (BMI) 40.0 and over, adult: Secondary | ICD-10-CM

## 2018-12-29 DIAGNOSIS — E559 Vitamin D deficiency, unspecified: Secondary | ICD-10-CM | POA: Diagnosis not present

## 2018-12-29 MED ORDER — VITAMIN D (ERGOCALCIFEROL) 1.25 MG (50000 UNIT) PO CAPS: 50000.0000 [IU] | ORAL_CAPSULE | ORAL | 0 refills | Status: DC

## 2019-01-02 NOTE — Progress Notes (Addendum)
Office: [phone]  /  Fax: [phone] TeleHealth Visit:  Kristina [name] has consented to this TeleHealth visit today via telephone call. The patient is located at home, the provider is located at the [facility] office. The participants in this visit include the listed provider and patient and any and all parties involved.   HPI:   Chief Complaint: OBESITY Kristina [name] is here to discuss her progress with her obesity treatment plan. She is on the Category 3 plan and is following her eating plan approximately 100 % of the time. She states she is walking and biking for 30 minutes 4 times per week. Kristina [name] is doing well. She has a digital scale and according to her digital scale, she has lost 4 pounds. Kristina [name] states the plan works well. Kristina [name] has had a hard time getting bread. She was not hungry with the plan. She denies cravings, except for occasional sweets. We were unable to weight the patient today for this TeleHealth visit.She feels as if she has lost weight since her last visit. She has lost 4 lbs since starting treatment with [name].  Hypertension Kristina [name] is a 73 y.o. female with hypertension. She is currently taking HCTZ and Losartan. Her last blood pressure was at 152/80 Kristina [name] denies chest pain or shortness of breath on exertion. She is working weight loss to help control her blood pressure with the goal of decreasing her risk of heart attack and stroke. [name] blood pressure is not currently controlled.  Hyperlipidemia Kristina [name] has hyperlipidemia and she has been on medication for years. She is currently taking Pravastatin. Her total cholesterol is at 223, triglycerides are at 170 and LDL is at 129 (12/15/18). She has been trying to improve her cholesterol levels with intensive lifestyle modification including a low saturated fat diet, exercise and weight loss. She denies myalgias.  Vitamin D deficiency Kristina [name] has a diagnosis of  vitamin D deficiency. Her last vitamin D level was at 31.8 Kristina [name] is currently taking 1,000 IU vit D and she denies nausea, vomiting or muscle weakness.  ASSESSMENT AND PLAN:  Essential hypertension  Other hyperlipidemia  Vitamin D deficiency - Plan: Vitamin D, Ergocalciferol, (DRISDOL) 1.25 MG (50000 UT) CAPS capsule  Class 3 severe obesity with serious comorbidity and body mass index (BMI) of 40.0 to 44.9 in adult, unspecified obesity type ([place])  PLAN:  Hypertension We discussed sodium restriction, working on healthy weight loss, and a regular exercise program as the means to achieve improved blood pressure control. Kristina [name] agreed with this plan and agreed to follow up as directed. We will continue to monitor her blood pressure as well as her progress with the above lifestyle modifications. She will continue her medications as prescribed and will watch for signs of hypotension as she continues her lifestyle modifications.  Hyperlipidemia Kristina [name] was informed of the American Heart Association Guidelines emphasizing intensive lifestyle modifications as the first line treatment for hyperlipidemia. We discussed many lifestyle modifications today in depth, and Kristina [name] will continue to work on decreasing saturated fats such as fatty red meat, butter and many fried foods. She will also increase vegetables and lean protein in her diet and continue to work on exercise and weight loss efforts.  Vitamin D Deficiency Kristina [name] was informed that low vitamin D levels contributes to fatigue and are associated with obesity, breast, and colon cancer. She agrees to start prescription Vit D @50 ,000 IU every week #4 with no refills and will follow up for routine testing of vitamin D,  at least 2-3 times per year. She was informed of the risk of over-replacement of vitamin D and agrees to not increase her dose unless she discusses this with [name] first. Kristina [name] agrees to follow up as directed. We will  recheck vitamin D level in 3 months.  Obesity Kristina [name] is currently in the action stage of change. As such, her goal is to continue with weight loss efforts She has agreed to follow the Category 3 plan Kristina [name] will continue to walk for weight loss and overall health benefits. We discussed the following Behavioral Modification Strategies today: increase H2O intake, no skipping meals, keeping healthy foods in the home, increasing lean protein intake, decreasing simple carbohydrates, increasing vegetables, decrease eating out and work on meal planning and easy cooking plans Additional meal planning, options and help with alternate food options with grocery store shortages were discussed with our registered dietician via telephone call today. Additional lunch options were e-mailed to patient today.  Kristina [name] has agreed to follow up with our clinic in 2 weeks. She was informed of the importance of frequent follow up visits to maximize her success with intensive lifestyle modifications for her multiple health conditions.  ALLERGIES: Allergies  Allergen Reactions  . Penicillins Anaphylaxis and Itching    MEDICATIONS: Current Outpatient Medications on File Prior to Visit  Medication Sig Dispense Refill  . Chlorpheniramine-Pseudoeph (ALLEREST MAXIMUM STRENGTH PO) Take 1 tablet by mouth daily as needed.    . cyclobenzaprine (FLEXERIL) 10 MG tablet Take 10 mg by mouth 3 (three) times daily as needed for muscle spasms.    . hydrochlorothiazide (HYDRODIURIL) 25 MG tablet Take 25 mg by mouth daily.    [name] losartan (COZAAR) 100 MG tablet Take 100 mg by mouth daily.    . montelukast (SINGULAIR) 10 MG tablet Take 1 tablet (10 mg total) by mouth at bedtime. 30 tablet 6  . Multiple Vitamin (MULTIVITAMIN) capsule Take 1 capsule by mouth daily.    . Multiple Vitamins-Minerals (AIRBORNE GUMMIES PO) Take by mouth.    . pravastatin (PRAVACHOL) 10 MG tablet Take 10 mg by mouth daily.    [name] triamcinolone  (NASACORT ALLERGY 24HR) 55 MCG/ACT AERO nasal inhaler Place 1 spray into the nose daily.     No current facility-administered medications on file prior to visit.     PAST MEDICAL HISTORY: Past Medical History:  Diagnosis Date  . Allergic rhinitis   . Anemia glaucoma  . Back pain   . DDD (degenerative disc disease), lumbar   . High blood pressure   . High cholesterol   . History of stomach ulcers   . Rheumatic fever   . Rheumatoid arteritis ([place])     PAST SURGICAL HISTORY: Past Surgical History:  Procedure Laterality Date  . APPENDECTOMY  1955  . CHOLECYSTECTOMY  1963  . HERNIA REPAIR  2007  . HERNIA REPAIR  2015  . REPLACEMENT TOTAL KNEE  2016  . TONSILLECTOMY      SOCIAL HISTORY: Social History   Tobacco Use  . Smoking status: Former Smoker    Packs/day: 1.50    Years: 30.00    Pack years: 45.00    Types: Cigarettes    Last attempt to quit: 2009    Years since quitting: 11.2  . Smokeless tobacco: Never Used  Substance Use Topics  . Alcohol use: Yes    Comment: social  . Drug use: Never    FAMILY HISTORY: Family History  Problem Relation Age of Onset  . Kidney disease Mother   .  Hypertension Mother   . Emphysema Father   . Asthma Father   . Hypertension Father   . Heart disease Father   . COPD Brother     ROS: Review of Systems  Constitutional: Positive for weight loss.  Respiratory: Negative for shortness of breath (on exertion).   Cardiovascular: Negative for chest pain.  Gastrointestinal: Negative for nausea and vomiting.  Musculoskeletal: Negative for myalgias.       Negative for muscle weakness    PHYSICAL EXAM: Pt in no acute distress  RECENT LABS AND TESTS: BMET    Component Value Date/Time   NA 140 12/15/2018 1210   K 4.6 12/15/2018 1210   CL 99 12/15/2018 1210   CO2 25 12/15/2018 1210   GLUCOSE 94 12/15/2018 1210   BUN 7 (L) 12/15/2018 1210   CREATININE 0.63 12/15/2018 1210   CALCIUM 9.5 12/15/2018 1210   GFRNONAA 90  12/15/2018 1210   GFRAA 104 12/15/2018 1210   Lab Results  Component Value Date   HGBA1C 5.5 12/15/2018   Lab Results  Component Value Date   INSULIN 7.1 12/15/2018   CBC    Component Value Date/Time   WBC 6.2 04/01/2018 0857   RBC 5.71 (H) 04/01/2018 0857   HGB 11.5 04/01/2018 0857   HCT 37.5 04/01/2018 0857   PLT 292 04/01/2018 0857   MCV 66 (L) 04/01/2018 0857   MCH 20.1 (L) 04/01/2018 0857   MCHC 30.7 (L) 04/01/2018 0857   RDW 18.6 (H) 04/01/2018 0857   LYMPHSABS 2.2 04/01/2018 0857   EOSABS 0.3 04/01/2018 0857   BASOSABS 0.0 04/01/2018 0857   Iron/TIBC/Ferritin/ %Sat No results found for: IRON, TIBC, FERRITIN, IRONPCTSAT Lipid Panel     Component Value Date/Time   CHOL 223 (H) 12/15/2018 1210   TRIG 170 (H) 12/15/2018 1210   HDL 60 12/15/2018 1210   LDLCALC 129 (H) 12/15/2018 1210   Hepatic Function Panel     Component Value Date/Time   PROT 6.9 12/15/2018 1210   ALBUMIN 4.3 12/15/2018 1210   AST 24 12/15/2018 1210   ALT 24 12/15/2018 1210   ALKPHOS 70 12/15/2018 1210   BILITOT 0.5 12/15/2018 1210   No results found for: TSH   Ref. Range 12/15/2018 12:10  Vitamin D, 25-Hydroxy Latest Ref Range: 30.0 - 100.0 ng/mL 31.8     I, Doreene Nest, am acting as Location manager for [employer]. Owens Shark, DO  I have reviewed the above documentation for accuracy and completeness, and I agree with the above. -Jearld Lesch, DO

## 2019-01-12 ENCOUNTER — Encounter (INDEPENDENT_AMBULATORY_CARE_PROVIDER_SITE_OTHER): Payer: Self-pay | Admitting: Bariatrics

## 2019-01-12 ENCOUNTER — Other Ambulatory Visit: Payer: Self-pay

## 2019-01-12 ENCOUNTER — Ambulatory Visit (INDEPENDENT_AMBULATORY_CARE_PROVIDER_SITE_OTHER): Payer: Medicare Other | Admitting: Bariatrics

## 2019-01-12 DIAGNOSIS — I10 Essential (primary) hypertension: Secondary | ICD-10-CM

## 2019-01-12 DIAGNOSIS — E559 Vitamin D deficiency, unspecified: Secondary | ICD-10-CM

## 2019-01-12 DIAGNOSIS — Z6841 Body Mass Index (BMI) 40.0 and over, adult: Secondary | ICD-10-CM

## 2019-01-12 DIAGNOSIS — E66813 Obesity, class 3: Secondary | ICD-10-CM

## 2019-01-12 DIAGNOSIS — K5909 Other constipation: Secondary | ICD-10-CM | POA: Diagnosis not present

## 2019-01-16 NOTE — Progress Notes (Signed)
Office: [phone]  /  Fax: [phone] TeleHealth Visit:  Kristina [name] has verbally consented to this TeleHealth visit today. The patient is located at home, the provider is located at the [facility] office. The participants in this visit include the listed provider and patient and any and all parties involved. The visit was conducted today via WebEx.  HPI:   Chief Complaint: OBESITY Kristina [name] is here to discuss her progress with her obesity treatment plan. She is on the Category 3 plan and is following her eating plan approximately 100 % of the time. She states she is walking for 30 minutes 7 times per week and riding the stationary bike for 30 minutes 4 times per week. Kristina [name] states that she has lost 11 pounds since her last visit. Her anxiety has been high. Kristina [name] is hungry in the morning, but her appetite is controlled the rest of the day. We were unable to weigh the patient today for this TeleHealth visit. She feels as if she has lost weight since her last visit. She has lost 14 lbs since starting treatment with [name].  Hypertension Kristina [name] is a 73 y.o. female with hypertension. She is taking HCTZ. Her blood pressures are 124/67, 149/77 and 120/58. Megann Easterwood denies lightheadedness. She is working weight loss to help control her blood pressure with the goal of decreasing her risk of heart attack and stroke. [name] blood pressure is currently controlled.  Vitamin D deficiency Kristina [name] has a diagnosis of vitamin D deficiency. She is taking high dose vit D. Elayne denies nausea, vomiting or muscle weakness.  Constipation Kristina [name] notes constipation for the last few weeks, worse since attempting weight loss. She states BM are less frequent and are not hard and painful. She denies abdominal pain, nausea or vomiting. She is getting adequate water.  ASSESSMENT AND PLAN:  Essential hypertension  Vitamin D deficiency  Other constipation   Class 3 severe obesity with serious comorbidity and body mass index (BMI) of 40.0 to 44.9 in adult, unspecified obesity type ([place])  PLAN:  Hypertension We discussed sodium restriction, working on healthy weight loss, and a regular exercise program as the means to achieve improved blood pressure control. [name] agreed with this plan and she agreed to follow up in 2 weeks. We will continue to monitor her blood pressure as well as her progress with the above lifestyle modifications. She will continue her medications as prescribed and she will check her blood pressures at home. Her PCP decreased her medication in half to 12.5 mg.  Vitamin D Deficiency [name] was informed that low vitamin D levels contributes to fatigue and are associated with obesity, breast, and colon cancer. She agrees to continue to take prescription Vit D @50 ,000 IU every week and will follow up for routine testing of vitamin D, at least 2-3 times per year. She was informed of the risk of over-replacement of vitamin D and agrees to not increase her dose unless she discusses this with [name] first.  Constipation [name] was informed decrease bowel movement frequency is normal while losing weight, but stools should not be hard or painful. She was advised to increase her H20 intake (64+ ounces) and consider wheat bran, or flax seed. She agrees to begin OTC Miralax daily and follow up as directed.  Obesity Kristina [name] is currently in the action stage of change. As such, her goal is to continue with weight loss efforts She has agreed to follow the Category 3 plan Kristina [name] has been instructed to  work up to a goal of 150 minutes of combined cardio and strengthening exercise per week for weight loss and overall health benefits. We discussed the following Behavioral Modification Strategies today: increase H2O intake, no skipping meals, keeping healthy foods in the home, increasing lean protein intake, decreasing simple carbohydrates,  increasing vegetables, decrease eating out and work on meal planning and easy cooking plans Kristina [name] will weigh herself at home. We discussed alternate sources of protein.  Kristina [name] has agreed to follow up with our clinic in 2 weeks. She was informed of the importance of frequent follow up visits to maximize her success with intensive lifestyle modifications for her multiple health conditions.  ALLERGIES: Allergies  Allergen Reactions  . Penicillins Anaphylaxis and Itching    MEDICATIONS: Current Outpatient Medications on File Prior to Visit  Medication Sig Dispense Refill  . Chlorpheniramine-Pseudoeph (ALLEREST MAXIMUM STRENGTH PO) Take 1 tablet by mouth daily as needed.    . cyclobenzaprine (FLEXERIL) 10 MG tablet Take 10 mg by mouth 3 (three) times daily as needed for muscle spasms.    . hydrochlorothiazide (HYDRODIURIL) 25 MG tablet Take 25 mg by mouth daily.    [name] losartan (COZAAR) 100 MG tablet Take 100 mg by mouth daily.    . montelukast (SINGULAIR) 10 MG tablet Take 1 tablet (10 mg total) by mouth at bedtime. 30 tablet 6  . Multiple Vitamin (MULTIVITAMIN) capsule Take 1 capsule by mouth daily.    . Multiple Vitamins-Minerals (AIRBORNE GUMMIES PO) Take by mouth.    . pravastatin (PRAVACHOL) 10 MG tablet Take 10 mg by mouth daily.    [name] triamcinolone (NASACORT ALLERGY 24HR) 55 MCG/ACT AERO nasal inhaler Place 1 spray into the nose daily.    . Vitamin D, Ergocalciferol, (DRISDOL) 1.25 MG (50000 UT) CAPS capsule Take 1 capsule (50,000 Units total) by mouth every 7 (seven) days. 4 capsule 0   No current facility-administered medications on file prior to visit.     PAST MEDICAL HISTORY: Past Medical History:  Diagnosis Date  . Allergic rhinitis   . Anemia glaucoma  . Back pain   . DDD (degenerative disc disease), lumbar   . High blood pressure   . High cholesterol   . History of stomach ulcers   . Rheumatic fever   . Rheumatoid arteritis ([place])     PAST SURGICAL HISTORY:  Past Surgical History:  Procedure Laterality Date  . APPENDECTOMY  1955  . CHOLECYSTECTOMY  1963  . HERNIA REPAIR  2007  . HERNIA REPAIR  2015  . REPLACEMENT TOTAL KNEE  2016  . TONSILLECTOMY      SOCIAL HISTORY: Social History   Tobacco Use  . Smoking status: Former Smoker    Packs/day: 1.50    Years: 30.00    Pack years: 45.00    Types: Cigarettes    Last attempt to quit: 2009    Years since quitting: 11.2  . Smokeless tobacco: Never Used  Substance Use Topics  . Alcohol use: Yes    Comment: social  . Drug use: Never    FAMILY HISTORY: Family History  Problem Relation Age of Onset  . Kidney disease Mother   . Hypertension Mother   . Emphysema Father   . Asthma Father   . Hypertension Father   . Heart disease Father   . COPD Brother     ROS: Review of Systems  Constitutional: Positive for weight loss.  Gastrointestinal: Negative for abdominal pain, nausea and vomiting.  Musculoskeletal:  Negative for muscle weakness  Neurological:       Negative for lightheadedness    PHYSICAL EXAM: Pt in no acute distress  RECENT LABS AND TESTS: BMET    Component Value Date/Time   NA 140 12/15/2018 1210   K 4.6 12/15/2018 1210   CL 99 12/15/2018 1210   CO2 25 12/15/2018 1210   GLUCOSE 94 12/15/2018 1210   BUN 7 (L) 12/15/2018 1210   CREATININE 0.63 12/15/2018 1210   CALCIUM 9.5 12/15/2018 1210   GFRNONAA 90 12/15/2018 1210   GFRAA 104 12/15/2018 1210   Lab Results  Component Value Date   HGBA1C 5.5 12/15/2018   Lab Results  Component Value Date   INSULIN 7.1 12/15/2018   CBC    Component Value Date/Time   WBC 6.2 04/01/2018 0857   RBC 5.71 (H) 04/01/2018 0857   HGB 11.5 04/01/2018 0857   HCT 37.5 04/01/2018 0857   PLT 292 04/01/2018 0857   MCV 66 (L) 04/01/2018 0857   MCH 20.1 (L) 04/01/2018 0857   MCHC 30.7 (L) 04/01/2018 0857   RDW 18.6 (H) 04/01/2018 0857   LYMPHSABS 2.2 04/01/2018 0857   EOSABS 0.3 04/01/2018 0857   BASOSABS 0.0  04/01/2018 0857   Iron/TIBC/Ferritin/ %Sat No results found for: IRON, TIBC, FERRITIN, IRONPCTSAT Lipid Panel     Component Value Date/Time   CHOL 223 (H) 12/15/2018 1210   TRIG 170 (H) 12/15/2018 1210   HDL 60 12/15/2018 1210   LDLCALC 129 (H) 12/15/2018 1210   Hepatic Function Panel     Component Value Date/Time   PROT 6.9 12/15/2018 1210   ALBUMIN 4.3 12/15/2018 1210   AST 24 12/15/2018 1210   ALT 24 12/15/2018 1210   ALKPHOS 70 12/15/2018 1210   BILITOT 0.5 12/15/2018 1210   No results found for: TSH    Ref. Range 12/15/2018 12:10  Vitamin D, 25-Hydroxy Latest Ref Range: 30.0 - 100.0 ng/mL 31.8   I, Doreene Nest, am acting as Location manager for [employer]. Owens Shark, DO  I have reviewed the above documentation for accuracy and completeness, and I agree with the above. -Jearld Lesch, DO

## 2019-01-26 ENCOUNTER — Ambulatory Visit (INDEPENDENT_AMBULATORY_CARE_PROVIDER_SITE_OTHER): Payer: Medicare Other | Admitting: Bariatrics

## 2019-01-26 ENCOUNTER — Other Ambulatory Visit: Payer: Self-pay

## 2019-01-26 ENCOUNTER — Encounter (INDEPENDENT_AMBULATORY_CARE_PROVIDER_SITE_OTHER): Payer: Self-pay | Admitting: Bariatrics

## 2019-01-26 DIAGNOSIS — I10 Essential (primary) hypertension: Secondary | ICD-10-CM | POA: Diagnosis not present

## 2019-01-26 DIAGNOSIS — Z6841 Body Mass Index (BMI) 40.0 and over, adult: Secondary | ICD-10-CM

## 2019-01-26 DIAGNOSIS — E66813 Obesity, class 3: Secondary | ICD-10-CM

## 2019-01-26 DIAGNOSIS — E559 Vitamin D deficiency, unspecified: Secondary | ICD-10-CM

## 2019-01-26 MED ORDER — VITAMIN D (ERGOCALCIFEROL) 1.25 MG (50000 UNIT) PO CAPS: 50000.0000 [IU] | ORAL_CAPSULE | ORAL | 0 refills | Status: DC

## 2019-01-30 NOTE — Progress Notes (Signed)
Office: [phone]  /  Fax: [phone] TeleHealth Visit:  Kristina [name] has verbally consented to this TeleHealth visit today. The patient is located at home, the provider is located at the [employer] [facility] office. The participants in this visit include the listed provider and patient and any and all parties involved. The visit was conducted today via WebEx.  HPI:   Chief Complaint: OBESITY Kristina [name] is here to discuss her progress with her obesity treatment plan. She is on the Category 3 plan and is following her eating plan approximately 90 % of the time. She states she is walking (2 miles) for 55 minutes 7 times per week. Kristina [name] states that she has lost 7 pounds (209 lbs). She has had some stress and she was put on a mild anti-depressant which has helped. Kristina [name] is resting better. We were unable to weigh the patient today for this TeleHealth visit. She feels as if she has lost weight since her last visit. She has lost 13 lbs since starting treatment with Kristina [name].  Vitamin D deficiency [name] has a diagnosis of vitamin D deficiency. She is currently taking vit D and denies nausea, vomiting or muscle weakness.  Hypertension [name] is a 73 y.o. female with hypertension. Her PCP had decreased her blood pressure medications at the last visit. She is taking Cozaar and HCTZ. Her blood pressure is at [place] denies chest pain or shortness of breath on exertion. She is working weight loss to help control her blood pressure with the goal of decreasing her risk of heart attack and stroke. [name] blood pressure is well controlled.  ASSESSMENT AND PLAN:  Vitamin D deficiency - Plan: Vitamin D, Ergocalciferol, (DRISDOL) 1.25 MG (50000 UT) CAPS capsule  Essential hypertension  Class 3 severe obesity with serious comorbidity and body mass index (BMI) of 40.0 to 44.9 in adult, unspecified obesity type ([place])  PLAN:  Vitamin D Deficiency [name]  was informed that low vitamin D levels contributes to fatigue and are associated with obesity, breast, and colon cancer. She agrees to continue to take prescription Vit D @50 ,000 IU every week #4 with no refills and will follow up for routine testing of vitamin D, at least 2-3 times per year. She was informed of the risk of over-replacement of vitamin D and agrees to not increase her dose unless she discusses this with Kristina [name] first. [name] agrees to follow up as directed.  Hypertension We discussed sodium restriction, working on healthy weight loss, and a regular exercise program as the means to achieve improved blood pressure control. Kristina [name] agreed with this plan and agreed to follow up as directed. We will continue to monitor her blood pressure as well as her progress with the above lifestyle modifications. She will continue her medications as prescribed and will watch for signs of hypotension as she continues her lifestyle modifications.  Obesity Kristina [name] is currently in the action stage of change. As such, her goal is to continue with weight loss efforts She has agreed to follow the Category 3 plan Kristina [name] will continue walking 2 miles daily (goal 3 miles daily) for weight loss and overall health benefits. We discussed the following Behavioral Modification Strategies today: increase H2O intake, no skipping meals, keeping healthy foods in the home, increasing lean protein intake, decreasing simple carbohydrates, increasing vegetables, decrease eating out and work on meal planning and easy cooking plans Kristina [name] will weigh herself at home before each visit. She will spread out the protein as needed.  Kristina [name]  has agreed to follow up with our clinic in 2 weeks. She was informed of the importance of frequent follow up visits to maximize her success with intensive lifestyle modifications for her multiple health conditions.  ALLERGIES: Allergies  Allergen Reactions  . Penicillins Anaphylaxis  and Itching    MEDICATIONS: Current Outpatient Medications on File Prior to Visit  Medication Sig Dispense Refill  . Chlorpheniramine-Pseudoeph (ALLEREST MAXIMUM STRENGTH PO) Take 1 tablet by mouth daily as needed.    . cyclobenzaprine (FLEXERIL) 10 MG tablet Take 10 mg by mouth 3 (three) times daily as needed for muscle spasms.    . hydrochlorothiazide (HYDRODIURIL) 25 MG tablet Take 25 mg by mouth daily.    Kristina [name] losartan (COZAAR) 100 MG tablet Take 100 mg by mouth daily.    . montelukast (SINGULAIR) 10 MG tablet Take 1 tablet (10 mg total) by mouth at bedtime. 30 tablet 6  . Multiple Vitamin (MULTIVITAMIN) capsule Take 1 capsule by mouth daily.    . Multiple Vitamins-Minerals (AIRBORNE GUMMIES PO) Take by mouth.    . pravastatin (PRAVACHOL) 10 MG tablet Take 10 mg by mouth daily.    Kristina [name] triamcinolone (NASACORT ALLERGY 24HR) 55 MCG/ACT AERO nasal inhaler Place 1 spray into the nose daily.     No current facility-administered medications on file prior to visit.     PAST MEDICAL HISTORY: Past Medical History:  Diagnosis Date  . Allergic rhinitis   . Anemia glaucoma  . Back pain   . DDD (degenerative disc disease), lumbar   . High blood pressure   . High cholesterol   . History of stomach ulcers   . Rheumatic fever   . Rheumatoid arteritis ([place])     PAST SURGICAL HISTORY: Past Surgical History:  Procedure Laterality Date  . APPENDECTOMY  1955  . CHOLECYSTECTOMY  1963  . HERNIA REPAIR  2007  . HERNIA REPAIR  2015  . REPLACEMENT TOTAL KNEE  2016  . TONSILLECTOMY      SOCIAL HISTORY: Social History   Tobacco Use  . Smoking status: Former Smoker    Packs/day: 1.50    Years: 30.00    Pack years: 45.00    Types: Cigarettes    Last attempt to quit: 2009    Years since quitting: 11.3  . Smokeless tobacco: Never Used  Substance Use Topics  . Alcohol use: Yes    Comment: social  . Drug use: Never    FAMILY HISTORY: Family History  Problem Relation Age of Onset  .  Kidney disease Mother   . Hypertension Mother   . Emphysema Father   . Asthma Father   . Hypertension Father   . Heart disease Father   . COPD Brother     ROS: Review of Systems  Constitutional: Positive for weight loss.  Respiratory: Negative for shortness of breath (on exertion).   Cardiovascular: Negative for chest pain.  Gastrointestinal: Negative for nausea and vomiting.  Musculoskeletal:       Negative for muscle weakness    PHYSICAL EXAM: Pt in no acute distress  RECENT LABS AND TESTS: BMET    Component Value Date/Time   NA 140 12/15/2018 1210   K 4.6 12/15/2018 1210   CL 99 12/15/2018 1210   CO2 25 12/15/2018 1210   GLUCOSE 94 12/15/2018 1210   BUN 7 (L) 12/15/2018 1210   CREATININE 0.63 12/15/2018 1210   CALCIUM 9.5 12/15/2018 1210   GFRNONAA 90 12/15/2018 1210   GFRAA 104 12/15/2018 1210  Lab Results  Component Value Date   HGBA1C 5.5 12/15/2018   Lab Results  Component Value Date   INSULIN 7.1 12/15/2018   CBC    Component Value Date/Time   WBC 6.2 04/01/2018 0857   RBC 5.71 (H) 04/01/2018 0857   HGB 11.5 04/01/2018 0857   HCT 37.5 04/01/2018 0857   PLT 292 04/01/2018 0857   MCV 66 (L) 04/01/2018 0857   MCH 20.1 (L) 04/01/2018 0857   MCHC 30.7 (L) 04/01/2018 0857   RDW 18.6 (H) 04/01/2018 0857   LYMPHSABS 2.2 04/01/2018 0857   EOSABS 0.3 04/01/2018 0857   BASOSABS 0.0 04/01/2018 0857   Iron/TIBC/Ferritin/ %Sat No results found for: IRON, TIBC, FERRITIN, IRONPCTSAT Lipid Panel     Component Value Date/Time   CHOL 223 (H) 12/15/2018 1210   TRIG 170 (H) 12/15/2018 1210   HDL 60 12/15/2018 1210   LDLCALC 129 (H) 12/15/2018 1210   Hepatic Function Panel     Component Value Date/Time   PROT 6.9 12/15/2018 1210   ALBUMIN 4.3 12/15/2018 1210   AST 24 12/15/2018 1210   ALT 24 12/15/2018 1210   ALKPHOS 70 12/15/2018 1210   BILITOT 0.5 12/15/2018 1210   No results found for: TSH    Ref. Range 12/15/2018 12:10  Vitamin D, 25-Hydroxy  Latest Ref Range: 30.0 - 100.0 ng/mL 31.8   I, Doreene Nest, am acting as Location manager for [employer]. Owens Shark, DO  I have reviewed the above documentation for accuracy and completeness, and I agree with the above. -Jearld Lesch, DO

## 2019-02-09 ENCOUNTER — Encounter (INDEPENDENT_AMBULATORY_CARE_PROVIDER_SITE_OTHER): Payer: Self-pay | Admitting: Bariatrics

## 2019-02-09 ENCOUNTER — Other Ambulatory Visit: Payer: Self-pay

## 2019-02-09 ENCOUNTER — Ambulatory Visit (INDEPENDENT_AMBULATORY_CARE_PROVIDER_SITE_OTHER): Payer: Medicare Other | Admitting: Bariatrics

## 2019-02-09 DIAGNOSIS — E66813 Obesity, class 3: Secondary | ICD-10-CM

## 2019-02-09 DIAGNOSIS — I10 Essential (primary) hypertension: Secondary | ICD-10-CM | POA: Diagnosis not present

## 2019-02-09 DIAGNOSIS — E559 Vitamin D deficiency, unspecified: Secondary | ICD-10-CM

## 2019-02-09 DIAGNOSIS — Z6841 Body Mass Index (BMI) 40.0 and over, adult: Secondary | ICD-10-CM

## 2019-02-13 NOTE — Progress Notes (Signed)
Office: [phone]  /  Fax: [phone] TeleHealth Visit:  Kristina [name] has verbally consented to this TeleHealth visit today. The patient is located at home, the provider is located at the [employer] [facility] office. The participants in this visit include the listed provider and patient and any and all parties involved. The visit was conducted today via WebEx.  HPI:   Chief Complaint: OBESITY Kristina [name] is here to discuss her progress with her obesity treatment plan. She is on the Category 3 plan and is following her eating plan approximately 85 to 90 % of the time. She states she is walking 2 1/2 miles 7 times per week. Kristina [name] states that she has lost 2 pounds. We were unable to weigh the patient today for this TeleHealth visit. She feels as if she has lost weight since her last visit. She has lost 15 lbs since starting treatment with [name].  Hypertension Kristina [name] is a 73 y.o. female with hypertension. She is taking HCTZ and Cozaar. Her blood pressure readings are 119/46 and 137/68.  Kristina [name] denies lightheadedness. She is working weight loss to help control her blood pressure with the goal of decreasing her risk of heart attack and stroke.   Vitamin D deficiency Kristina [name] has a diagnosis of vitamin D deficiency. Her last vitamin D level was at 31.8 She is currently taking vit D and denies nausea, vomiting or muscle weakness.  ASSESSMENT AND PLAN:  Vitamin D deficiency  Essential hypertension  Class 3 severe obesity with serious comorbidity and body mass index (BMI) of 40.0 to 44.9 in adult, unspecified obesity type ([place])  PLAN:  Hypertension We discussed sodium restriction, working on healthy weight loss, and a regular exercise program as the means to achieve improved blood pressure control. Kristina [name] agreed with this plan and agreed to follow up as directed. We will continue to monitor her blood pressure as well as her progress with the above  lifestyle modifications. She will watch her blood pressure and will lower the dose. Her PCP has decreased the dose of HCTZ to 1/2 dose. Kristina [name] will watch for signs of hypotension as she continues her lifestyle modifications.  Vitamin D Deficiency Kristina [name] was informed that low vitamin D levels contributes to fatigue and are associated with obesity, breast, and colon cancer. She will continue to take prescription Vit D @50 ,000 IU every week and will follow up for routine testing of vitamin D, at least 2-3 times per year. She was informed of the risk of over-replacement of vitamin D and agrees to not increase her dose unless she discusses this with [name] first.  Obesity Kristina [name] is currently in the action stage of change. As such, her goal is to continue with weight loss efforts She has agreed to follow the Category 3 plan Kristina [name] will continue her exercise regimen (see above) for weight loss and overall health benefits. We discussed the following Behavioral Modification Strategies today: increase H2O intake, no skipping meals, keeping healthy foods in the home, increasing lean protein intake, decreasing simple carbohydrates, increasing vegetables, decrease eating out and work on meal planning and easy cooking plans Kristina [name] will weigh herself at home and record before each visit.  Kristina [name] has agreed to follow up with our clinic in 2 weeks. She was informed of the importance of frequent follow up visits to maximize her success with intensive lifestyle modifications for her multiple health conditions.  ALLERGIES: Allergies  Allergen Reactions  . Penicillins Anaphylaxis and Itching    MEDICATIONS: Current Outpatient  Medications on File Prior to Visit  Medication Sig Dispense Refill  . Chlorpheniramine-Pseudoeph (ALLEREST MAXIMUM STRENGTH PO) Take 1 tablet by mouth daily as needed.    . cyclobenzaprine (FLEXERIL) 10 MG tablet Take 10 mg by mouth 3 (three) times daily as needed for muscle  spasms.    . hydrochlorothiazide (HYDRODIURIL) 25 MG tablet Take 25 mg by mouth daily. Take 1/2 tab qd    . losartan (COZAAR) 100 MG tablet Take 100 mg by mouth daily.    . montelukast (SINGULAIR) 10 MG tablet Take 1 tablet (10 mg total) by mouth at bedtime. 30 tablet 6  . Multiple Vitamin (MULTIVITAMIN) capsule Take 1 capsule by mouth daily.    . Multiple Vitamins-Minerals (AIRBORNE GUMMIES PO) Take by mouth.    . pravastatin (PRAVACHOL) 10 MG tablet Take 10 mg by mouth daily.    [name] triamcinolone (NASACORT ALLERGY 24HR) 55 MCG/ACT AERO nasal inhaler Place 1 spray into the nose daily.    . Vitamin D, Ergocalciferol, (DRISDOL) 1.25 MG (50000 UT) CAPS capsule Take 1 capsule (50,000 Units total) by mouth every 7 (seven) days. 4 capsule 0   No current facility-administered medications on file prior to visit.     PAST MEDICAL HISTORY: Past Medical History:  Diagnosis Date  . Allergic rhinitis   . Anemia glaucoma  . Back pain   . DDD (degenerative disc disease), lumbar   . High blood pressure   . High cholesterol   . History of stomach ulcers   . Rheumatic fever   . Rheumatoid arteritis ([place])     PAST SURGICAL HISTORY: Past Surgical History:  Procedure Laterality Date  . APPENDECTOMY  1955  . CHOLECYSTECTOMY  1963  . HERNIA REPAIR  2007  . HERNIA REPAIR  2015  . REPLACEMENT TOTAL KNEE  2016  . TONSILLECTOMY      SOCIAL HISTORY: Social History   Tobacco Use  . Smoking status: Former Smoker    Packs/day: 1.50    Years: 30.00    Pack years: 45.00    Types: Cigarettes    Last attempt to quit: 2009    Years since quitting: 11.3  . Smokeless tobacco: Never Used  Substance Use Topics  . Alcohol use: Yes    Comment: social  . Drug use: Never    FAMILY HISTORY: Family History  Problem Relation Age of Onset  . Kidney disease Mother   . Hypertension Mother   . Emphysema Father   . Asthma Father   . Hypertension Father   . Heart disease Father   . COPD Brother      ROS: Review of Systems  Constitutional: Positive for weight loss.  Gastrointestinal: Negative for nausea and vomiting.  Musculoskeletal:       Negative for muscle weakness  Neurological:       Negative for lightheadedness    PHYSICAL EXAM: Pt in no acute distress  RECENT LABS AND TESTS: BMET    Component Value Date/Time   NA 140 12/15/2018 1210   K 4.6 12/15/2018 1210   CL 99 12/15/2018 1210   CO2 25 12/15/2018 1210   GLUCOSE 94 12/15/2018 1210   BUN 7 (L) 12/15/2018 1210   CREATININE 0.63 12/15/2018 1210   CALCIUM 9.5 12/15/2018 1210   GFRNONAA 90 12/15/2018 1210   GFRAA 104 12/15/2018 1210   Lab Results  Component Value Date   HGBA1C 5.5 12/15/2018   Lab Results  Component Value Date   INSULIN 7.1 12/15/2018   CBC  Component Value Date/Time   WBC 6.2 04/01/2018 0857   RBC 5.71 (H) 04/01/2018 0857   HGB 11.5 04/01/2018 0857   HCT 37.5 04/01/2018 0857   PLT 292 04/01/2018 0857   MCV 66 (L) 04/01/2018 0857   MCH 20.1 (L) 04/01/2018 0857   MCHC 30.7 (L) 04/01/2018 0857   RDW 18.6 (H) 04/01/2018 0857   LYMPHSABS 2.2 04/01/2018 0857   EOSABS 0.3 04/01/2018 0857   BASOSABS 0.0 04/01/2018 0857   Iron/TIBC/Ferritin/ %Sat No results found for: IRON, TIBC, FERRITIN, IRONPCTSAT Lipid Panel     Component Value Date/Time   CHOL 223 (H) 12/15/2018 1210   TRIG 170 (H) 12/15/2018 1210   HDL 60 12/15/2018 1210   LDLCALC 129 (H) 12/15/2018 1210   Hepatic Function Panel     Component Value Date/Time   PROT 6.9 12/15/2018 1210   ALBUMIN 4.3 12/15/2018 1210   AST 24 12/15/2018 1210   ALT 24 12/15/2018 1210   ALKPHOS 70 12/15/2018 1210   BILITOT 0.5 12/15/2018 1210   No results found for: TSH    Ref. Range 12/15/2018 12:10  Vitamin D, 25-Hydroxy Latest Ref Range: 30.0 - 100.0 ng/mL 31.8    I, Doreene Nest, am acting as Location manager for [employer]. Owens Shark, DO  I have reviewed the above documentation for accuracy and completeness, and I agree with the  above. -Jearld Lesch, DO

## 2019-02-23 ENCOUNTER — Ambulatory Visit (INDEPENDENT_AMBULATORY_CARE_PROVIDER_SITE_OTHER): Payer: Medicare Other | Admitting: Bariatrics

## 2019-02-23 ENCOUNTER — Encounter (INDEPENDENT_AMBULATORY_CARE_PROVIDER_SITE_OTHER): Payer: Self-pay | Admitting: Bariatrics

## 2019-02-23 ENCOUNTER — Other Ambulatory Visit: Payer: Self-pay

## 2019-02-23 DIAGNOSIS — E559 Vitamin D deficiency, unspecified: Secondary | ICD-10-CM | POA: Diagnosis not present

## 2019-02-23 DIAGNOSIS — I10 Essential (primary) hypertension: Secondary | ICD-10-CM

## 2019-02-23 DIAGNOSIS — Z6841 Body Mass Index (BMI) 40.0 and over, adult: Secondary | ICD-10-CM

## 2019-02-23 DIAGNOSIS — E66813 Obesity, class 3: Secondary | ICD-10-CM

## 2019-02-23 MED ORDER — VITAMIN D (ERGOCALCIFEROL) 1.25 MG (50000 UNIT) PO CAPS: 50000.0000 [IU] | ORAL_CAPSULE | ORAL | 0 refills | Status: DC

## 2019-02-28 NOTE — Progress Notes (Signed)
Office: [phone]  /  Fax: [phone] TeleHealth Visit:  Kristina [name] has verbally consented to this TeleHealth visit today. The patient is located at home, the provider is located at the [employer] [facility] office. The participants in this visit include the listed provider and patient and any and all parties involved. The visit was conducted today via WebEx.  HPI:   Chief Complaint: OBESITY Kristina [name] is here to discuss her progress with her obesity treatment plan. She is on the Category 3 plan and is following her eating plan approximately 90 % of the time. She states she is walking for 60 minutes, 2 1/2 miles on nice days and she walked for 30 minutes with it raining 1 time this week. Kristina [name] states that she has lost 1 pound (weight 205 lbs). She knows that her clothes are getting more loose. We were unable to weigh the patient today for this TeleHealth visit. She feels as if she has lost weight since her last visit. She has lost 17 lbs since starting treatment with [name].  Hypertension Kristina [name] is a 73 y.o. female with hypertension. She is taking HCTZ and Cozaar. Kristina [name] denies chest pain or shortness of breath on exertion. She is working weight loss to help control her blood pressure with the goal of decreasing her risk of heart attack and stroke. Kristina [name] blood pressure is controlled at 130/59.  Vitamin D deficiency Kristina [name] has a diagnosis of vitamin D deficiency. Her last vitamin D level was at 31.8 She is currently taking vit D and denies nausea, vomiting or muscle weakness.  ASSESSMENT AND PLAN:  Essential hypertension  Vitamin D deficiency - Plan: Vitamin D, Ergocalciferol, (DRISDOL) 1.25 MG (50000 UT) CAPS capsule  Class 3 severe obesity with serious comorbidity and body mass index (BMI) of 40.0 to 44.9 in adult, unspecified obesity type ([place])  PLAN:  Hypertension We discussed sodium restriction, working on healthy weight loss, and a  regular exercise program as the means to achieve improved blood pressure control. Kristina [name] agreed with this plan and agreed to follow up as directed. We will continue to monitor her blood pressure as well as her progress with the above lifestyle modifications. She will continue her medications as prescribed and will watch for signs of hypotension as she continues her lifestyle modifications.  Vitamin D Deficiency Kristina [name] was informed that low vitamin D levels contributes to fatigue and are associated with obesity, breast, and colon cancer. She agrees to continue to take prescription Vit D @50 ,000 IU every week #4 with no refills and will follow up for routine testing of vitamin D, at least 2-3 times per year. She was informed of the risk of over-replacement of vitamin D and agrees to not increase her dose unless she discusses this with [name] first. Kristina [name] agrees to follow up as directed.  Obesity Kristina [name] is currently in the action stage of change. As such, her goal is to continue with weight loss efforts She has agreed to follow the Category 3 plan [name] will continue exercise and she will increase for weight loss and overall health benefits. We discussed the following Behavioral Modification Strategies today: increase H2O intake, no skipping meals, keeping healthy foods in the home, increasing lean protein intake, decreasing simple carbohydrates, increasing vegetables, decrease eating out and work on meal planning and easy cooking plans Kristina [name] will continue to weigh herself at home until she comes back into the office.  Kristina [name] has agreed to follow up with our clinic in 2 weeks.  She was informed of the importance of frequent follow up visits to maximize her success with intensive lifestyle modifications for her multiple health conditions.  ALLERGIES: Allergies  Allergen Reactions  . Penicillins Anaphylaxis and Itching    MEDICATIONS: Current Outpatient Medications on File Prior  to Visit  Medication Sig Dispense Refill  . Chlorpheniramine-Pseudoeph (ALLEREST MAXIMUM STRENGTH PO) Take 1 tablet by mouth daily as needed.    . cyclobenzaprine (FLEXERIL) 10 MG tablet Take 10 mg by mouth 3 (three) times daily as needed for muscle spasms.    . hydrochlorothiazide (HYDRODIURIL) 25 MG tablet Take 25 mg by mouth daily. Take 1/2 tab qd    . losartan (COZAAR) 100 MG tablet Take 100 mg by mouth daily.    . montelukast (SINGULAIR) 10 MG tablet Take 1 tablet (10 mg total) by mouth at bedtime. 30 tablet 6  . Multiple Vitamin (MULTIVITAMIN) capsule Take 1 capsule by mouth daily.    . Multiple Vitamins-Minerals (AIRBORNE GUMMIES PO) Take by mouth.    . pravastatin (PRAVACHOL) 10 MG tablet Take 10 mg by mouth daily.    [name] triamcinolone (NASACORT ALLERGY 24HR) 55 MCG/ACT AERO nasal inhaler Place 1 spray into the nose daily.     No current facility-administered medications on file prior to visit.     PAST MEDICAL HISTORY: Past Medical History:  Diagnosis Date  . Allergic rhinitis   . Anemia glaucoma  . Back pain   . DDD (degenerative disc disease), lumbar   . High blood pressure   . High cholesterol   . History of stomach ulcers   . Rheumatic fever   . Rheumatoid arteritis ([place])     PAST SURGICAL HISTORY: Past Surgical History:  Procedure Laterality Date  . APPENDECTOMY  1955  . CHOLECYSTECTOMY  1963  . HERNIA REPAIR  2007  . HERNIA REPAIR  2015  . REPLACEMENT TOTAL KNEE  2016  . TONSILLECTOMY      SOCIAL HISTORY: Social History   Tobacco Use  . Smoking status: Former Smoker    Packs/day: 1.50    Years: 30.00    Pack years: 45.00    Types: Cigarettes    Last attempt to quit: 2009    Years since quitting: 11.4  . Smokeless tobacco: Never Used  Substance Use Topics  . Alcohol use: Yes    Comment: social  . Drug use: Never    FAMILY HISTORY: Family History  Problem Relation Age of Onset  . Kidney disease Mother   . Hypertension Mother   . Emphysema  Father   . Asthma Father   . Hypertension Father   . Heart disease Father   . COPD Brother     ROS: Review of Systems  Constitutional: Positive for weight loss.  Respiratory: Negative for shortness of breath (on exertion).   Cardiovascular: Negative for chest pain.  Gastrointestinal: Negative for nausea and vomiting.  Musculoskeletal:       Negative for muscle weakness    PHYSICAL EXAM: Pt in no acute distress  RECENT LABS AND TESTS: BMET    Component Value Date/Time   NA 140 12/15/2018 1210   K 4.6 12/15/2018 1210   CL 99 12/15/2018 1210   CO2 25 12/15/2018 1210   GLUCOSE 94 12/15/2018 1210   BUN 7 (L) 12/15/2018 1210   CREATININE 0.63 12/15/2018 1210   CALCIUM 9.5 12/15/2018 1210   GFRNONAA 90 12/15/2018 1210   GFRAA 104 12/15/2018 1210   Lab Results  Component Value Date  HGBA1C 5.5 12/15/2018   Lab Results  Component Value Date   INSULIN 7.1 12/15/2018   CBC    Component Value Date/Time   WBC 6.2 04/01/2018 0857   RBC 5.71 (H) 04/01/2018 0857   HGB 11.5 04/01/2018 0857   HCT 37.5 04/01/2018 0857   PLT 292 04/01/2018 0857   MCV 66 (L) 04/01/2018 0857   MCH 20.1 (L) 04/01/2018 0857   MCHC 30.7 (L) 04/01/2018 0857   RDW 18.6 (H) 04/01/2018 0857   LYMPHSABS 2.2 04/01/2018 0857   EOSABS 0.3 04/01/2018 0857   BASOSABS 0.0 04/01/2018 0857   Iron/TIBC/Ferritin/ %Sat No results found for: IRON, TIBC, FERRITIN, IRONPCTSAT Lipid Panel     Component Value Date/Time   CHOL 223 (H) 12/15/2018 1210   TRIG 170 (H) 12/15/2018 1210   HDL 60 12/15/2018 1210   LDLCALC 129 (H) 12/15/2018 1210   Hepatic Function Panel     Component Value Date/Time   PROT 6.9 12/15/2018 1210   ALBUMIN 4.3 12/15/2018 1210   AST 24 12/15/2018 1210   ALT 24 12/15/2018 1210   ALKPHOS 70 12/15/2018 1210   BILITOT 0.5 12/15/2018 1210   No results found for: TSH    Ref. Range 12/15/2018 12:10  Vitamin D, 25-Hydroxy Latest Ref Range: 30.0 - 100.0 ng/mL 31.8    I, Doreene Nest, am acting as Location manager for [employer]. Owens Shark, DO  I have reviewed the above documentation for accuracy and completeness, and I agree with the above. -Jearld Lesch, DO

## 2019-03-09 ENCOUNTER — Encounter (INDEPENDENT_AMBULATORY_CARE_PROVIDER_SITE_OTHER): Payer: Self-pay | Admitting: Bariatrics

## 2019-03-09 ENCOUNTER — Ambulatory Visit (INDEPENDENT_AMBULATORY_CARE_PROVIDER_SITE_OTHER): Payer: Medicare Other | Admitting: Bariatrics

## 2019-03-09 ENCOUNTER — Other Ambulatory Visit: Payer: Self-pay

## 2019-03-09 DIAGNOSIS — I10 Essential (primary) hypertension: Secondary | ICD-10-CM | POA: Diagnosis not present

## 2019-03-09 DIAGNOSIS — E559 Vitamin D deficiency, unspecified: Secondary | ICD-10-CM

## 2019-03-09 DIAGNOSIS — Z6841 Body Mass Index (BMI) 40.0 and over, adult: Secondary | ICD-10-CM

## 2019-03-09 MED ORDER — VITAMIN D (ERGOCALCIFEROL) 1.25 MG (50000 UNIT) PO CAPS: 50000.0000 [IU] | ORAL_CAPSULE | ORAL | 0 refills | Status: DC

## 2019-03-13 NOTE — Progress Notes (Signed)
Office: [phone]  /  Fax: [phone] TeleHealth Visit:  Kristina [name] has verbally consented to this TeleHealth visit today. The patient is located at home, the provider is located at the [facility] office. The participants in this visit include the listed provider and patient and any and all parties involved. The visit was conducted today via WebEx.  HPI:   Chief Complaint: OBESITY Kristina [name] is here to discuss her progress with her obesity treatment plan. She is on the Category 3 plan and is following her eating plan approximately 80 to 85 % of the time. She states she is walking 2 1/2 miles 7 times per week. Kristina [name] states that she is down 4 pounds (weight 202 lbs). She is doing well overall. Kristina [name] is doing well with water and protein. We were unable to weigh the patient today for this TeleHealth visit. She feels as if she has lost weight since her last visit. She has lost 20 lbs since starting treatment with [name].  Vitamin D deficiency Kristina [name] has a diagnosis of vitamin D deficiency. She is taking high dose vit D and denies nausea, vomiting or muscle weakness.  Hypertension Kristina [name] is a 73 y.o. female with hypertension. She is taking HCTZ and Cozaar. Kristina [name] denies lightheadedness. She is working weight loss to help control her blood pressure with the goal of decreasing her risk of heart attack and stroke. Kristina [name] blood pressure is currently controlled.  ASSESSMENT AND PLAN:  Vitamin D deficiency - Plan: Vitamin D, Ergocalciferol, (DRISDOL) 1.25 MG (50000 UT) CAPS capsule  Essential hypertension  Class 3 severe obesity with serious comorbidity and body mass index (BMI) of 40.0 to 44.9 in adult, unspecified obesity type ([place])  PLAN:  Vitamin D Deficiency Kristina [name] was informed that low vitamin D levels contributes to fatigue and are associated with obesity, breast, and colon cancer. She agrees to continue to take prescription  Vit D @50 ,000 IU every week #4 with no refills and will follow up for routine testing of vitamin D, at least 2-3 times per year. She was informed of the risk of over-replacement of vitamin D and agrees to not increase her dose unless she discusses this with [name] first. Kristina [name] agrees to follow up as directed.  Hypertension We discussed sodium restriction, working on healthy weight loss, and a regular exercise program as the means to achieve improved blood pressure control. Kristina [name] agreed with this plan and agreed to follow up as directed. We will continue to monitor her blood pressure as well as her progress with the above lifestyle modifications. She will continue her medications as prescribed and will watch for signs of hypotension as she continues her lifestyle modifications.  Obesity Kristina [name] is currently in the action stage of change. As such, her goal is to continue with weight loss efforts She has agreed to keep a food journal with 450 to 600 calories and 40 grams of protein at supper daily and follow the Category 3 plan Kristina [name] will continue her exercise regimen for weight loss and overall health benefits. We discussed the following Behavioral Modification Strategies today: planning for success, increase H2O intake, no skipping meals, keeping healthy foods in the home, better snacking choices, increasing lean protein intake, decreasing simple carbohydrates, increasing vegetables, decrease eating out and work on meal planning and easy cooking plans Kristina [name] will weigh herself at home and record. She will use "[employer]".  Kristina [name] has agreed to follow up with our clinic in 2 weeks. She was informed of  the importance of frequent follow up visits to maximize her success with intensive lifestyle modifications for her multiple health conditions.  ALLERGIES: Allergies  Allergen Reactions  . Penicillins Anaphylaxis and Itching    MEDICATIONS: Current Outpatient Medications on File  Prior to Visit  Medication Sig Dispense Refill  . Chlorpheniramine-Pseudoeph (ALLEREST MAXIMUM STRENGTH PO) Take 1 tablet by mouth daily as needed.    . cyclobenzaprine (FLEXERIL) 10 MG tablet Take 10 mg by mouth 3 (three) times daily as needed for muscle spasms.    . hydrochlorothiazide (HYDRODIURIL) 25 MG tablet Take 25 mg by mouth daily. Take 1/2 tab qd    . losartan (COZAAR) 100 MG tablet Take 100 mg by mouth daily.    . montelukast (SINGULAIR) 10 MG tablet Take 1 tablet (10 mg total) by mouth at bedtime. 30 tablet 6  . Multiple Vitamin (MULTIVITAMIN) capsule Take 1 capsule by mouth daily.    . Multiple Vitamins-Minerals (AIRBORNE GUMMIES PO) Take by mouth.    . pravastatin (PRAVACHOL) 10 MG tablet Take 10 mg by mouth daily.    [name] triamcinolone (NASACORT ALLERGY 24HR) 55 MCG/ACT AERO nasal inhaler Place 1 spray into the nose daily.     No current facility-administered medications on file prior to visit.     PAST MEDICAL HISTORY: Past Medical History:  Diagnosis Date  . Allergic rhinitis   . Anemia glaucoma  . Back pain   . DDD (degenerative disc disease), lumbar   . High blood pressure   . High cholesterol   . History of stomach ulcers   . Rheumatic fever   . Rheumatoid arteritis ([place])     PAST SURGICAL HISTORY: Past Surgical History:  Procedure Laterality Date  . APPENDECTOMY  1955  . CHOLECYSTECTOMY  1963  . HERNIA REPAIR  2007  . HERNIA REPAIR  2015  . REPLACEMENT TOTAL KNEE  2016  . TONSILLECTOMY      SOCIAL HISTORY: Social History   Tobacco Use  . Smoking status: Former Smoker    Packs/day: 1.50    Years: 30.00    Pack years: 45.00    Types: Cigarettes    Last attempt to quit: 2009    Years since quitting: 11.4  . Smokeless tobacco: Never Used  Substance Use Topics  . Alcohol use: Yes    Comment: social  . Drug use: Never    FAMILY HISTORY: Family History  Problem Relation Age of Onset  . Kidney disease Mother   . Hypertension Mother   .  Emphysema Father   . Asthma Father   . Hypertension Father   . Heart disease Father   . COPD Brother     ROS: Review of Systems  Constitutional: Positive for weight loss.  Gastrointestinal: Negative for nausea and vomiting.  Musculoskeletal:       Negative for muscle weakness  Neurological:       Negative for lightheadedness    PHYSICAL EXAM: Pt in no acute distress  RECENT LABS AND TESTS: BMET    Component Value Date/Time   NA 140 12/15/2018 1210   K 4.6 12/15/2018 1210   CL 99 12/15/2018 1210   CO2 25 12/15/2018 1210   GLUCOSE 94 12/15/2018 1210   BUN 7 (L) 12/15/2018 1210   CREATININE 0.63 12/15/2018 1210   CALCIUM 9.5 12/15/2018 1210   GFRNONAA 90 12/15/2018 1210   GFRAA 104 12/15/2018 1210   Lab Results  Component Value Date   HGBA1C 5.5 12/15/2018   Lab Results  Component Value Date   INSULIN 7.1 12/15/2018   CBC    Component Value Date/Time   WBC 6.2 04/01/2018 0857   RBC 5.71 (H) 04/01/2018 0857   HGB 11.5 04/01/2018 0857   HCT 37.5 04/01/2018 0857   PLT 292 04/01/2018 0857   MCV 66 (L) 04/01/2018 0857   MCH 20.1 (L) 04/01/2018 0857   MCHC 30.7 (L) 04/01/2018 0857   RDW 18.6 (H) 04/01/2018 0857   LYMPHSABS 2.2 04/01/2018 0857   EOSABS 0.3 04/01/2018 0857   BASOSABS 0.0 04/01/2018 0857   Iron/TIBC/Ferritin/ %Sat No results found for: IRON, TIBC, FERRITIN, IRONPCTSAT Lipid Panel     Component Value Date/Time   CHOL 223 (H) 12/15/2018 1210   TRIG 170 (H) 12/15/2018 1210   HDL 60 12/15/2018 1210   LDLCALC 129 (H) 12/15/2018 1210   Hepatic Function Panel     Component Value Date/Time   PROT 6.9 12/15/2018 1210   ALBUMIN 4.3 12/15/2018 1210   AST 24 12/15/2018 1210   ALT 24 12/15/2018 1210   ALKPHOS 70 12/15/2018 1210   BILITOT 0.5 12/15/2018 1210   No results found for: TSH    Ref. Range 12/15/2018 12:10  Vitamin D, 25-Hydroxy Latest Ref Range: 30.0 - 100.0 ng/mL 31.8    I, Doreene Nest, am acting as Location manager for [employer]. Owens Shark, DO  I have reviewed the above documentation for accuracy and completeness, and I agree with the above. -Jearld Lesch, DO

## 2019-03-21 ENCOUNTER — Encounter (INDEPENDENT_AMBULATORY_CARE_PROVIDER_SITE_OTHER): Payer: Self-pay | Admitting: Bariatrics

## 2019-03-21 ENCOUNTER — Ambulatory Visit (INDEPENDENT_AMBULATORY_CARE_PROVIDER_SITE_OTHER): Payer: Medicare Other | Admitting: Bariatrics

## 2019-03-21 ENCOUNTER — Other Ambulatory Visit: Payer: Self-pay

## 2019-03-21 DIAGNOSIS — Z6841 Body Mass Index (BMI) 40.0 and over, adult: Secondary | ICD-10-CM | POA: Diagnosis not present

## 2019-03-21 DIAGNOSIS — I10 Essential (primary) hypertension: Secondary | ICD-10-CM

## 2019-03-21 DIAGNOSIS — E559 Vitamin D deficiency, unspecified: Secondary | ICD-10-CM | POA: Diagnosis not present

## 2019-03-22 NOTE — Progress Notes (Signed)
Office: [phone]  /  Fax: [phone] TeleHealth Visit:  Kristina [name] has verbally consented to this TeleHealth visit today. The patient is located at home, the provider is located at the [facility] office. The participants in this visit include the listed provider and patient. The visit was conducted today via Webex.  HPI:   Chief Complaint: OBESITY Kristina [name] is here to discuss her progress with her obesity treatment plan. She is on the Category 3 plan and is following her eating plan approximately 85-90% of the time. She states she is walking 45 minutes 7 times per week. Kristina [name] states that she has lost 2 lbs (weight 200 lbs) and can tell that she has lost weight. She is not struggling on the plan.  We were unable to weigh the patient today for this TeleHealth visit. She feels as if she has lost 2 lbs since her last visit. She has lost 0 lbs since starting treatment with [name].  Vitamin D deficiency Kristina [name] has a diagnosis of Vitamin D deficiency. Her last Vitamin D level was reported to be 31.8 on 12/15/2018. She is currently taking high dose Vit D and denies nausea, vomiting or muscle weakness.  Hypertension Kristina [name] is a 73 y.o. female with hypertension and is taking HCTZ.  Kristina [name] denies chest pain or shortness of breath on exertion. She is working weight loss to help control her blood pressure with the goal of decreasing her risk of heart attack and stroke. [name]'s blood pressure is currently well controlled.  ASSESSMENT AND PLAN:  Vitamin D deficiency  Essential hypertension  Class 3 severe obesity with serious comorbidity and body mass index (BMI) of 40.0 to 44.9 in adult, unspecified obesity type ([place])  PLAN:  Vitamin D Deficiency Kristina [name] was informed that low Vitamin D levels contributes to fatigue and are associated with obesity, breast, and colon cancer. She agrees to continue to take Vit D and will follow-up for routine  testing of Vitamin D, at least 2-3 times per year. She was informed of the risk of over-replacement of Vitamin D and agrees to not increase her dose unless she discusses this with [name] first. Angalina agrees to follow-up with our clinic in 2 weeks.  Hypertension We discussed sodium restriction, working on healthy weight loss, and a regular exercise program as the means to achieve improved blood pressure control. Kristina [name] agreed with this plan and agreed to follow up as directed. We will continue to monitor her blood pressure as well as her progress with the above lifestyle modifications. She will continue her medications as prescribed and will watch for signs of hypotension as she continues her lifestyle modifications.  Obesity Kristina [name] is currently in the action stage of change. As such, her goal is to continue with weight loss efforts. She has agreed to follow the Category 3 plan. Kristina [name] will weigh herself at home and record. She will continue working on meal planning. Kristina [name] has been instructed to continue her current exercise regimen for weight loss and overall health benefits. We discussed the following Behavioral Modification Strategies today: increasing lean protein intake, decreasing simple carbohydrates, increasing vegetables, increase H20 intake, decrease eating out, no skipping meals, work on meal planning and easy cooking plans, and keeping healthy foods in the home.  Kristina [name] has agreed to follow-up with our clinic in 2 weeks. She was informed of the importance of frequent follow-up visits to maximize her success with intensive lifestyle modifications for her multiple health conditions.  ALLERGIES: Allergies  Allergen  Reactions  . Penicillins Anaphylaxis and Itching    MEDICATIONS: Current Outpatient Medications on File Prior to Visit  Medication Sig Dispense Refill  . Chlorpheniramine-Pseudoeph (ALLEREST MAXIMUM STRENGTH PO) Take 1 tablet by mouth daily as needed.    .  cyclobenzaprine (FLEXERIL) 10 MG tablet Take 10 mg by mouth 3 (three) times daily as needed for muscle spasms.    . hydrochlorothiazide (HYDRODIURIL) 25 MG tablet Take 25 mg by mouth daily. Take 1/2 tab qd    . losartan (COZAAR) 100 MG tablet Take 100 mg by mouth daily.    . montelukast (SINGULAIR) 10 MG tablet Take 1 tablet (10 mg total) by mouth at bedtime. 30 tablet 6  . Multiple Vitamin (MULTIVITAMIN) capsule Take 1 capsule by mouth daily.    . Multiple Vitamins-Minerals (AIRBORNE GUMMIES PO) Take by mouth.    . pravastatin (PRAVACHOL) 10 MG tablet Take 10 mg by mouth daily.    [name] triamcinolone (NASACORT ALLERGY 24HR) 55 MCG/ACT AERO nasal inhaler Place 1 spray into the nose daily.    . Vitamin D, Ergocalciferol, (DRISDOL) 1.25 MG (50000 UT) CAPS capsule Take 1 capsule (50,000 Units total) by mouth every 7 (seven) days. 4 capsule 0   No current facility-administered medications on file prior to visit.     PAST MEDICAL HISTORY: Past Medical History:  Diagnosis Date  . Allergic rhinitis   . Anemia glaucoma  . Back pain   . DDD (degenerative disc disease), lumbar   . High blood pressure   . High cholesterol   . History of stomach ulcers   . Rheumatic fever   . Rheumatoid arteritis ([place])     PAST SURGICAL HISTORY: Past Surgical History:  Procedure Laterality Date  . APPENDECTOMY  1955  . CHOLECYSTECTOMY  1963  . HERNIA REPAIR  2007  . HERNIA REPAIR  2015  . REPLACEMENT TOTAL KNEE  2016  . TONSILLECTOMY      SOCIAL HISTORY: Social History   Tobacco Use  . Smoking status: Former Smoker    Packs/day: 1.50    Years: 30.00    Pack years: 45.00    Types: Cigarettes    Quit date: 2009    Years since quitting: 11.4  . Smokeless tobacco: Never Used  Substance Use Topics  . Alcohol use: Yes    Comment: social  . Drug use: Never    FAMILY HISTORY: Family History  Problem Relation Age of Onset  . Kidney disease Mother   . Hypertension Mother   . Emphysema Father   .  Asthma Father   . Hypertension Father   . Heart disease Father   . COPD Brother    ROS: Review of Systems  Respiratory: Negative for shortness of breath.   Cardiovascular: Negative for chest pain.  Gastrointestinal: Negative for nausea and vomiting.  Musculoskeletal:       Negative for muscle weakness.   PHYSICAL EXAM: Pt in no acute distress  RECENT LABS AND TESTS: BMET    Component Value Date/Time   NA 140 12/15/2018 1210   K 4.6 12/15/2018 1210   CL 99 12/15/2018 1210   CO2 25 12/15/2018 1210   GLUCOSE 94 12/15/2018 1210   BUN 7 (L) 12/15/2018 1210   CREATININE 0.63 12/15/2018 1210   CALCIUM 9.5 12/15/2018 1210   GFRNONAA 90 12/15/2018 1210   GFRAA 104 12/15/2018 1210   Lab Results  Component Value Date   HGBA1C 5.5 12/15/2018   Lab Results  Component Value Date  INSULIN 7.1 12/15/2018   CBC    Component Value Date/Time   WBC 6.2 04/01/2018 0857   RBC 5.71 (H) 04/01/2018 0857   HGB 11.5 04/01/2018 0857   HCT 37.5 04/01/2018 0857   PLT 292 04/01/2018 0857   MCV 66 (L) 04/01/2018 0857   MCH 20.1 (L) 04/01/2018 0857   MCHC 30.7 (L) 04/01/2018 0857   RDW 18.6 (H) 04/01/2018 0857   LYMPHSABS 2.2 04/01/2018 0857   EOSABS 0.3 04/01/2018 0857   BASOSABS 0.0 04/01/2018 0857   Iron/TIBC/Ferritin/ %Sat No results found for: IRON, TIBC, FERRITIN, IRONPCTSAT Lipid Panel     Component Value Date/Time   CHOL 223 (H) 12/15/2018 1210   TRIG 170 (H) 12/15/2018 1210   HDL 60 12/15/2018 1210   LDLCALC 129 (H) 12/15/2018 1210   Hepatic Function Panel     Component Value Date/Time   PROT 6.9 12/15/2018 1210   ALBUMIN 4.3 12/15/2018 1210   AST 24 12/15/2018 1210   ALT 24 12/15/2018 1210   ALKPHOS 70 12/15/2018 1210   BILITOT 0.5 12/15/2018 1210   No results found for: TSH  Results for Kristina, [name] (MRN [id number]) as of 03/22/2019 08:35  Ref. Range 12/15/2018 12:10  Vitamin D, 25-Hydroxy Latest Ref Range: 30.0 - 100.0 ng/mL 31.8   I, Michaelene Song, am  acting as Location manager for [employer], DO  I have reviewed the above documentation for accuracy and completeness, and I agree with the above. -Jearld Lesch, DO

## 2019-04-04 ENCOUNTER — Other Ambulatory Visit: Payer: Self-pay

## 2019-04-04 ENCOUNTER — Encounter (INDEPENDENT_AMBULATORY_CARE_PROVIDER_SITE_OTHER): Payer: Self-pay | Admitting: Bariatrics

## 2019-04-04 ENCOUNTER — Telehealth (INDEPENDENT_AMBULATORY_CARE_PROVIDER_SITE_OTHER): Payer: Medicare Other | Admitting: Bariatrics

## 2019-04-04 DIAGNOSIS — Z6841 Body Mass Index (BMI) 40.0 and over, adult: Secondary | ICD-10-CM

## 2019-04-04 DIAGNOSIS — E559 Vitamin D deficiency, unspecified: Secondary | ICD-10-CM | POA: Diagnosis not present

## 2019-04-04 DIAGNOSIS — E7849 Other hyperlipidemia: Secondary | ICD-10-CM | POA: Diagnosis not present

## 2019-04-04 MED ORDER — VITAMIN D (ERGOCALCIFEROL) 1.25 MG (50000 UNIT) PO CAPS: 50000.0000 [IU] | ORAL_CAPSULE | ORAL | 0 refills | Status: DC

## 2019-04-05 NOTE — Progress Notes (Signed)
Office: [phone]  /  Fax: [phone] TeleHealth Visit:  Kristina [name] has verbally consented to this TeleHealth visit today. The patient is located at home, the provider is located at the [facility] office. The participants in this visit include the listed provider and patient and any and all parties involved. The visit was conducted today via WebEx.  HPI:   Chief Complaint: OBESITY Kristina [name] is here to discuss her progress with her obesity treatment plan. She is on the Category 3 plan and is following her eating plan approximately 80 to 85 % of the time. She states she is walking 45 to 60 minutes 4 days per week. Kristina [name] states that she has maintained her weight (weight 200 lbs). She states that she may not be getting enough water. We were unable to weigh the patient today for this TeleHealth visit. She feels as if she has maintained weight since her last visit. She has lost 22 lbs since starting treatment with [name].  Vitamin D deficiency Kristina [name] has a diagnosis of vitamin D deficiency. She is currently taking vit D and denies nausea, vomiting or muscle weakness.  Hyperlipidemia Kristina [name] has hyperlipidemia. She is taking statin. Kristina [name] has been trying to improve her cholesterol levels with intensive lifestyle modification including a low saturated fat diet, exercise and weight loss. She denies myalgias.  ASSESSMENT AND PLAN:  Vitamin D deficiency - Plan: Vitamin D, Ergocalciferol, (DRISDOL) 1.25 MG (50000 UT) CAPS capsule  Other hyperlipidemia  Class 3 severe obesity with serious comorbidity and body mass index (BMI) of 40.0 to 44.9 in adult, unspecified obesity type ([place])  PLAN:  Vitamin D Deficiency Kristina [name] was informed that low vitamin D levels contributes to fatigue and are associated with obesity, breast, and colon cancer. She agrees to continue to take prescription Vit D @50 ,000 IU every week #4 with no refills and will follow up for routine  testing of vitamin D, at least 2-3 times per year. She was informed of the risk of over-replacement of vitamin D and agrees to not increase her dose unless she discusses this with [name] first. Kristina [name] agrees to follow up as directed.  Hyperlipidemia Kristina [name] was informed of the American Heart Association Guidelines emphasizing intensive lifestyle modifications as the first line treatment for hyperlipidemia. We discussed many lifestyle modifications today in depth, and Kristina [name] will continue to work on decreasing saturated fats such as fatty red meat, butter and many fried foods. She will also increase vegetables and lean protein in her diet and continue to work on exercise and weight loss efforts. Kristina [name] will continue her medications and follow up as directed.  Obesity Kristina [name] is currently in the action stage of change. As such, her goal is to continue with weight loss efforts She has agreed to follow the Category 3 plan Keri will continue walking 45 to 60 minutes 4 times per week for weight loss and overall health benefits. We discussed the following Behavioral Modification Strategies today: increase H2O intake, no skipping meals, keeping healthy foods in the home, increasing lean protein intake, decreasing simple carbohydrates, increasing vegetables, decrease eating out and work on meal planning and intentional eating Kristina [name] will continue to weigh herself at home.  Kristina [name] has agreed to follow up with our clinic in 2 weeks. She was informed of the importance of frequent follow up visits to maximize her success with intensive lifestyle modifications for her multiple health conditions.  ALLERGIES: Allergies  Allergen Reactions   Penicillins Anaphylaxis and Itching    MEDICATIONS:  Current Outpatient Medications on File Prior to Visit  Medication Sig Dispense Refill   Chlorpheniramine-Pseudoeph (ALLEREST MAXIMUM STRENGTH PO) Take 1 tablet by mouth daily as needed.      cyclobenzaprine (FLEXERIL) 10 MG tablet Take 10 mg by mouth 3 (three) times daily as needed for muscle spasms.     hydrochlorothiazide (HYDRODIURIL) 25 MG tablet Take 25 mg by mouth daily. Take 1/2 tab qd     losartan (COZAAR) 100 MG tablet Take 100 mg by mouth daily.     montelukast (SINGULAIR) 10 MG tablet Take 1 tablet (10 mg total) by mouth at bedtime. 30 tablet 6   Multiple Vitamin (MULTIVITAMIN) capsule Take 1 capsule by mouth daily.     Multiple Vitamins-Minerals (AIRBORNE GUMMIES PO) Take by mouth.     pravastatin (PRAVACHOL) 10 MG tablet Take 10 mg by mouth daily.     triamcinolone (NASACORT ALLERGY 24HR) 55 MCG/ACT AERO nasal inhaler Place 1 spray into the nose daily.     No current facility-administered medications on file prior to visit.     PAST MEDICAL HISTORY: Past Medical History:  Diagnosis Date   Allergic rhinitis    Anemia glaucoma   Back pain    DDD (degenerative disc disease), lumbar    High blood pressure    High cholesterol    History of stomach ulcers    Rheumatic fever    Rheumatoid arteritis ([place])     PAST SURGICAL HISTORY: Past Surgical History:  Procedure Laterality Date   [place]   HERNIA REPAIR  2007   HERNIA REPAIR  2015   REPLACEMENT TOTAL KNEE  2016   TONSILLECTOMY      SOCIAL HISTORY: Social History   Tobacco Use   Smoking status: Former Smoker    Packs/day: 1.50    Years: 30.00    Pack years: 45.00    Types: Cigarettes    Quit date: 2009    Years since quitting: 11.5   Smokeless tobacco: Never Used  Substance Use Topics   Alcohol use: Yes    Comment: social   Drug use: Never    FAMILY HISTORY: Family History  Problem Relation Age of Onset   Kidney disease Mother    Hypertension Mother    Emphysema Father    Asthma Father    Hypertension Father    Heart disease Father    COPD Brother     ROS: Review of Systems  Constitutional: Negative for weight  loss.  Gastrointestinal: Negative for nausea and vomiting.  Musculoskeletal: Negative for myalgias.       Negative for muscle weakness    PHYSICAL EXAM: Pt in no acute distress  RECENT LABS AND TESTS: BMET    Component Value Date/Time   NA 140 12/15/2018 1210   K 4.6 12/15/2018 1210   CL 99 12/15/2018 1210   CO2 25 12/15/2018 1210   GLUCOSE 94 12/15/2018 1210   BUN 7 (L) 12/15/2018 1210   CREATININE 0.63 12/15/2018 1210   CALCIUM 9.5 12/15/2018 1210   GFRNONAA 90 12/15/2018 1210   GFRAA 104 12/15/2018 1210   Lab Results  Component Value Date   HGBA1C 5.5 12/15/2018   Lab Results  Component Value Date   INSULIN 7.1 12/15/2018   CBC    Component Value Date/Time   WBC 6.2 04/01/2018 0857   RBC 5.71 (H) 04/01/2018 0857   HGB 11.5 04/01/2018 0857   HCT 37.5 04/01/2018 0857   PLT 292  04/01/2018 0857   MCV 66 (L) 04/01/2018 0857   MCH 20.1 (L) 04/01/2018 0857   MCHC 30.7 (L) 04/01/2018 0857   RDW 18.6 (H) 04/01/2018 0857   LYMPHSABS 2.2 04/01/2018 0857   EOSABS 0.3 04/01/2018 0857   BASOSABS 0.0 04/01/2018 0857   Iron/TIBC/Ferritin/ %Sat No results found for: IRON, TIBC, FERRITIN, IRONPCTSAT Lipid Panel     Component Value Date/Time   CHOL 223 (H) 12/15/2018 1210   TRIG 170 (H) 12/15/2018 1210   HDL 60 12/15/2018 1210   LDLCALC 129 (H) 12/15/2018 1210   Hepatic Function Panel     Component Value Date/Time   PROT 6.9 12/15/2018 1210   ALBUMIN 4.3 12/15/2018 1210   AST 24 12/15/2018 1210   ALT 24 12/15/2018 1210   ALKPHOS 70 12/15/2018 1210   BILITOT 0.5 12/15/2018 1210   No results found for: TSH   Ref. Range 12/15/2018 12:10  Vitamin D, 25-Hydroxy Latest Ref Range: 30.0 - 100.0 ng/mL 31.8    I, Doreene Nest, am acting as Location manager for [employer]. Owens Shark, DO\  I have reviewed the above documentation for accuracy and completeness, and I agree with the above. -Jearld Lesch, DO

## 2019-04-11 ENCOUNTER — Other Ambulatory Visit: Payer: Self-pay | Admitting: Internal Medicine

## 2019-04-11 DIAGNOSIS — Z20822 Contact with and (suspected) exposure to covid-19: Secondary | ICD-10-CM

## 2019-04-17 LAB — NOVEL CORONAVIRUS, NAA: SARS-CoV-2, NAA: NOT DETECTED

## 2019-04-18 ENCOUNTER — Telehealth (INDEPENDENT_AMBULATORY_CARE_PROVIDER_SITE_OTHER): Payer: Medicare Other | Admitting: Bariatrics

## 2019-04-27 ENCOUNTER — Other Ambulatory Visit: Payer: Self-pay | Admitting: Allergy and Immunology

## 2019-05-02 ENCOUNTER — Telehealth (INDEPENDENT_AMBULATORY_CARE_PROVIDER_SITE_OTHER): Payer: Medicare Other | Admitting: Bariatrics

## 2019-05-02 ENCOUNTER — Encounter (INDEPENDENT_AMBULATORY_CARE_PROVIDER_SITE_OTHER): Payer: Self-pay | Admitting: Bariatrics

## 2019-05-02 ENCOUNTER — Other Ambulatory Visit: Payer: Self-pay

## 2019-05-02 DIAGNOSIS — Z6841 Body Mass Index (BMI) 40.0 and over, adult: Secondary | ICD-10-CM

## 2019-05-02 DIAGNOSIS — E559 Vitamin D deficiency, unspecified: Secondary | ICD-10-CM

## 2019-05-02 DIAGNOSIS — I10 Essential (primary) hypertension: Secondary | ICD-10-CM | POA: Diagnosis not present

## 2019-05-02 MED ORDER — VITAMIN D (ERGOCALCIFEROL) 1.25 MG (50000 UNIT) PO CAPS: 50000.0000 [IU] | ORAL_CAPSULE | ORAL | 0 refills | Status: DC

## 2019-05-02 NOTE — Progress Notes (Signed)
Office: [phone]  /  Fax: [phone] TeleHealth Visit:  Kristina [name] has verbally consented to this TeleHealth visit today. The patient is located at home, the provider is located at the [facility] office. The participants in this visit include the listed provider and patient. The visit was conducted today via webex.  HPI:   Chief Complaint: OBESITY Kristina [name] is here to discuss her progress with her obesity treatment plan. She is on the Category 3 plan and is following her eating plan approximately 85-90 % of the time. She states she is walking 1-1 1/2 miles 7 times per week. Kristina [name] states that she has lost 2 lbs (weight is of 198 lbs) and she is doing well overall. She is doing well with her water.  We were unable to weigh the patient today for this TeleHealth visit. She feels as if she has lost 2 lbs since her last visit. She has lost 22 lbs since starting treatment with [name].  Vitamin D Deficiency Kristina [name] has a diagnosis of vitamin D deficiency. She is currently taking prescription Vit D and denies nausea, vomiting or muscle weakness.  Hypertension Kristina [name] is a 73 y.o. female with hypertension. Lauryl's blood pressure is well controlled at 126/68. She denies chest pain. She is working on weight loss to help control her blood pressure with the goal of decreasing her risk of heart attack and stroke.   ASSESSMENT AND PLAN:  Essential hypertension  Vitamin D deficiency - Plan: Vitamin D, Ergocalciferol, (DRISDOL) 1.25 MG (50000 UT) CAPS capsule  Class 3 severe obesity with serious comorbidity and body mass index (BMI) of 40.0 to 44.9 in adult, unspecified obesity type ([place])  PLAN:  Vitamin D Deficiency Kristina [name] was informed that low vitamin D levels contributes to fatigue and are associated with obesity, breast, and colon cancer. Harman agrees to continue taking prescription Vit D 50,000 IU every week #4 and we will refill for 1 month. She  will follow up for routine testing of vitamin D, at least 2-3 times per year. She was informed of the risk of over-replacement of vitamin D and agrees to not increase her dose unless she discusses this with [name] first. Kristina [name] agrees to follow up with our clinic in 2 weeks.  Hypertension We discussed sodium restriction, working on healthy weight loss, and a regular exercise program as the means to achieve improved blood pressure control. Kristina [name] agreed with this plan and agreed to follow up as directed. We will continue to monitor her blood pressure as well as her progress with the above lifestyle modifications. Kristina [name] agrees to continue her medications and will watch for signs of hypotension as she continues her lifestyle modifications. Kristina [name] agrees to follow up with our clinic in 2 weeks.  Obesity Kristina [name] is currently in the action stage of change. As such, her goal is to continue with weight loss efforts She has agreed to follow the Category 3 plan Kristina [name] has been instructed to work up to a goal of 150 minutes of combined cardio and strengthening exercise per week or continue walking and doing light weights for arms for weight loss and overall health benefits. We discussed the following Behavioral Modification Strategies today: increasing lean protein intake, decreasing simple carbohydrates, increasing vegetables, increase H20 intake, decrease eating out, no skipping meals, work on meal planning and easy cooking plans, and keeping healthy foods in the home   Kristina [name] has agreed to follow up with our clinic in 2 weeks. She was informed of the  importance of frequent follow up visits to maximize her success with intensive lifestyle modifications for her multiple health conditions.  ALLERGIES: Allergies  Allergen Reactions   Penicillins Anaphylaxis and Itching    MEDICATIONS: Current Outpatient Medications on File Prior to Visit  Medication Sig Dispense Refill    Chlorpheniramine-Pseudoeph (ALLEREST MAXIMUM STRENGTH PO) Take 1 tablet by mouth daily as needed.     cyclobenzaprine (FLEXERIL) 10 MG tablet Take 10 mg by mouth 3 (three) times daily as needed for muscle spasms.     hydrochlorothiazide (HYDRODIURIL) 25 MG tablet Take 25 mg by mouth daily. Take 1/2 tab qd     losartan (COZAAR) 100 MG tablet Take 100 mg by mouth daily.     montelukast (SINGULAIR) 10 MG tablet TAKE 1 TABLET(10 MG) BY MOUTH AT BEDTIME 90 tablet 0   Multiple Vitamin (MULTIVITAMIN) capsule Take 1 capsule by mouth daily.     Multiple Vitamins-Minerals (AIRBORNE GUMMIES PO) Take by mouth.     pravastatin (PRAVACHOL) 10 MG tablet Take 10 mg by mouth daily.     triamcinolone (NASACORT ALLERGY 24HR) 55 MCG/ACT AERO nasal inhaler Place 1 spray into the nose daily.     No current facility-administered medications on file prior to visit.     PAST MEDICAL HISTORY: Past Medical History:  Diagnosis Date   Allergic rhinitis    Anemia glaucoma   Back pain    DDD (degenerative disc disease), lumbar    High blood pressure    High cholesterol    History of stomach ulcers    Rheumatic fever    Rheumatoid arteritis ([place])     PAST SURGICAL HISTORY: Past Surgical History:  Procedure Laterality Date   [place]   HERNIA REPAIR  2007   HERNIA REPAIR  2015   REPLACEMENT TOTAL KNEE  2016   TONSILLECTOMY      SOCIAL HISTORY: Social History   Tobacco Use   Smoking status: Former Smoker    Packs/day: 1.50    Years: 30.00    Pack years: 45.00    Types: Cigarettes    Quit date: 2009    Years since quitting: 11.5   Smokeless tobacco: Never Used  Substance Use Topics   Alcohol use: Yes    Comment: social   Drug use: Never    FAMILY HISTORY: Family History  Problem Relation Age of Onset   Kidney disease Mother    Hypertension Mother    Emphysema Father    Asthma Father    Hypertension Father    Heart disease  Father    COPD Brother     ROS: Review of Systems  Constitutional: Positive for weight loss.  Cardiovascular: Negative for chest pain.  Gastrointestinal: Negative for nausea and vomiting.  Musculoskeletal:       Negative muscle weakness    PHYSICAL EXAM: Pt in no acute distress  RECENT LABS AND TESTS: BMET    Component Value Date/Time   NA 140 12/15/2018 1210   K 4.6 12/15/2018 1210   CL 99 12/15/2018 1210   CO2 25 12/15/2018 1210   GLUCOSE 94 12/15/2018 1210   BUN 7 (L) 12/15/2018 1210   CREATININE 0.63 12/15/2018 1210   CALCIUM 9.5 12/15/2018 1210   GFRNONAA 90 12/15/2018 1210   GFRAA 104 12/15/2018 1210   Lab Results  Component Value Date   HGBA1C 5.5 12/15/2018   Lab Results  Component Value Date   INSULIN 7.1 12/15/2018   CBC  Component Value Date/Time   WBC 6.2 04/01/2018 0857   RBC 5.71 (H) 04/01/2018 0857   HGB 11.5 04/01/2018 0857   HCT 37.5 04/01/2018 0857   PLT 292 04/01/2018 0857   MCV 66 (L) 04/01/2018 0857   MCH 20.1 (L) 04/01/2018 0857   MCHC 30.7 (L) 04/01/2018 0857   RDW 18.6 (H) 04/01/2018 0857   LYMPHSABS 2.2 04/01/2018 0857   EOSABS 0.3 04/01/2018 0857   BASOSABS 0.0 04/01/2018 0857   Iron/TIBC/Ferritin/ %Sat No results found for: IRON, TIBC, FERRITIN, IRONPCTSAT Lipid Panel     Component Value Date/Time   CHOL 223 (H) 12/15/2018 1210   TRIG 170 (H) 12/15/2018 1210   HDL 60 12/15/2018 1210   LDLCALC 129 (H) 12/15/2018 1210   Hepatic Function Panel     Component Value Date/Time   PROT 6.9 12/15/2018 1210   ALBUMIN 4.3 12/15/2018 1210   AST 24 12/15/2018 1210   ALT 24 12/15/2018 1210   ALKPHOS 70 12/15/2018 1210   BILITOT 0.5 12/15/2018 1210   No results found for: TSH    I, Trixie Dredge, am acting as transcriptionist for [employer], DO  I have reviewed the above documentation for accuracy and completeness, and I agree with the above. Jearld Lesch, DO

## 2019-05-16 ENCOUNTER — Other Ambulatory Visit: Payer: Self-pay

## 2019-05-16 ENCOUNTER — Encounter (INDEPENDENT_AMBULATORY_CARE_PROVIDER_SITE_OTHER): Payer: Self-pay | Admitting: Bariatrics

## 2019-05-16 ENCOUNTER — Telehealth (INDEPENDENT_AMBULATORY_CARE_PROVIDER_SITE_OTHER): Payer: Medicare Other | Admitting: Bariatrics

## 2019-05-16 DIAGNOSIS — E7849 Other hyperlipidemia: Secondary | ICD-10-CM | POA: Diagnosis not present

## 2019-05-16 DIAGNOSIS — E559 Vitamin D deficiency, unspecified: Secondary | ICD-10-CM | POA: Diagnosis not present

## 2019-05-16 DIAGNOSIS — Z6835 Body mass index (BMI) 35.0-35.9, adult: Secondary | ICD-10-CM | POA: Diagnosis not present

## 2019-05-18 NOTE — Progress Notes (Signed)
Office: [phone]  /  Fax: [phone] TeleHealth Visit:  Kristina [name] has verbally consented to this TeleHealth visit today. The patient is located at home, the provider is located at the [facility] office. The participants in this visit include the listed provider and patient and any and all parties involved. The visit was conducted today via WebEx.  HPI:   Chief Complaint: OBESITY [name] is here to discuss her progress with her obesity treatment plan. She is on the Category 3 plan and is following her eating plan approximately 75 % of the time. She states she is exercising 0 minutes 0 times per week. [name] states that she is down three pounds (weight 195 lbs 05/16/19). The last two weeks have been hectic. [name] denies any struggles. She has been able to get adequate water and protein. We were unable to weigh the patient today for this TeleHealth visit. She feels as if she has lost weight since her last visit. She has lost 27 lbs since starting treatment with [name].  Vitamin D deficiency [name] has a diagnosis of vitamin D deficiency. Her last vitamin D level was at 31.8 She is currently taking vit D and denies nausea, vomiting or muscle weakness.  Hyperlipidemia [name] has hyperlipidemia and she is taking Pravastatin. She has been trying to improve her cholesterol levels with intensive lifestyle modification including a low saturated fat diet, exercise and weight loss. She denies any chest pain or myalgias.  ASSESSMENT AND PLAN:  Other hyperlipidemia  Vitamin D deficiency  Class 2 severe obesity with serious comorbidity and body mass index (BMI) of 35.0 to 35.9 in adult, unspecified obesity type ([place])  PLAN:  Vitamin D Deficiency [name] was informed that low vitamin D levels contributes to fatigue and are associated with obesity, breast, and colon cancer. [name] will continue to take prescription Vit D @50 ,000 IU every week and she will  follow up for routine testing of vitamin D, at least 2-3 times per year. She was informed of the risk of over-replacement of vitamin D and agrees to not increase her dose unless she discusses this with [name] first.  Hyperlipidemia [name] was informed of the American Heart Association Guidelines emphasizing intensive lifestyle modifications as the first line treatment for hyperlipidemia. We discussed many lifestyle modifications today in depth, and [name] will continue to work on decreasing saturated fats such as fatty red meat, butter and many fried foods. She will also increase PUFA's, MUFA's, vegetables and lean protein in her diet and continue to work on exercise and weight loss efforts. [name] will continue her medications and she will follow up as directed.  Obesity [name] is currently in the action stage of change. As such, her goal is to continue with weight loss efforts She has agreed to follow the Category 3 plan [name] will resume exercise for weight loss and overall health benefits. We discussed the following Behavioral Modification Strategies today: planning for success, increase H2O intake, no skipping meals, keeping healthy foods in the home, increasing lean protein intake, decreasing simple carbohydrates, increasing vegetables, decrease eating out and work on meal planning and intentional eating [name] will do more cooking.  [name] has agreed to follow up with our clinic in 2 weeks. She was informed of the importance of frequent follow up visits to maximize her success with intensive lifestyle modifications for her multiple health conditions.  ALLERGIES: Allergies  Allergen Reactions  . Penicillins Anaphylaxis and Itching    MEDICATIONS: Current Outpatient Medications on File Prior  to Visit  Medication Sig Dispense Refill  . Chlorpheniramine-Pseudoeph (ALLEREST MAXIMUM STRENGTH PO) Take 1 tablet by mouth daily as needed.    . cyclobenzaprine (FLEXERIL) 10 MG  tablet Take 10 mg by mouth 3 (three) times daily as needed for muscle spasms.    . hydrochlorothiazide (HYDRODIURIL) 25 MG tablet Take 25 mg by mouth daily. Take 1/2 tab qd    . losartan (COZAAR) 100 MG tablet Take 100 mg by mouth daily.    . montelukast (SINGULAIR) 10 MG tablet TAKE 1 TABLET(10 MG) BY MOUTH AT BEDTIME 90 tablet 0  . Multiple Vitamin (MULTIVITAMIN) capsule Take 1 capsule by mouth daily.    . Multiple Vitamins-Minerals (AIRBORNE GUMMIES PO) Take by mouth.    . pravastatin (PRAVACHOL) 10 MG tablet Take 10 mg by mouth daily.    [name] triamcinolone (NASACORT ALLERGY 24HR) 55 MCG/ACT AERO nasal inhaler Place 1 spray into the nose daily.    . Vitamin D, Ergocalciferol, (DRISDOL) 1.25 MG (50000 UT) CAPS capsule Take 1 capsule (50,000 Units total) by mouth every 7 (seven) days. 4 capsule 0   No current facility-administered medications on file prior to visit.     PAST MEDICAL HISTORY: Past Medical History:  Diagnosis Date  . Allergic rhinitis   . Anemia glaucoma  . Back pain   . DDD (degenerative disc disease), lumbar   . High blood pressure   . High cholesterol   . History of stomach ulcers   . Rheumatic fever   . Rheumatoid arteritis ([place])     PAST SURGICAL HISTORY: Past Surgical History:  Procedure Laterality Date  . APPENDECTOMY  1955  . CHOLECYSTECTOMY  1963  . HERNIA REPAIR  2007  . HERNIA REPAIR  2015  . REPLACEMENT TOTAL KNEE  2016  . TONSILLECTOMY      SOCIAL HISTORY: Social History   Tobacco Use  . Smoking status: Former Smoker    Packs/day: 1.50    Years: 30.00    Pack years: 45.00    Types: Cigarettes    Quit date: 2009    Years since quitting: 11.6  . Smokeless tobacco: Never Used  Substance Use Topics  . Alcohol use: Yes    Comment: social  . Drug use: Never    FAMILY HISTORY: Family History  Problem Relation Age of Onset  . Kidney disease Mother   . Hypertension Mother   . Emphysema Father   . Asthma Father   . Hypertension Father    . Heart disease Father   . COPD Brother     ROS: Review of Systems  Constitutional: Positive for weight loss.  Cardiovascular: Negative for chest pain.  Gastrointestinal: Negative for nausea and vomiting.  Musculoskeletal: Negative for myalgias.       Negative for muscle weakness    PHYSICAL EXAM: Pt in no acute distress  RECENT LABS AND TESTS: BMET    Component Value Date/Time   NA 140 12/15/2018 1210   K 4.6 12/15/2018 1210   CL 99 12/15/2018 1210   CO2 25 12/15/2018 1210   GLUCOSE 94 12/15/2018 1210   BUN 7 (L) 12/15/2018 1210   CREATININE 0.63 12/15/2018 1210   CALCIUM 9.5 12/15/2018 1210   GFRNONAA 90 12/15/2018 1210   GFRAA 104 12/15/2018 1210   Lab Results  Component Value Date   HGBA1C 5.5 12/15/2018   Lab Results  Component Value Date   INSULIN 7.1 12/15/2018   CBC    Component Value Date/Time   WBC 6.2  04/01/2018 0857   RBC 5.71 (H) 04/01/2018 0857   HGB 11.5 04/01/2018 0857   HCT 37.5 04/01/2018 0857   PLT 292 04/01/2018 0857   MCV 66 (L) 04/01/2018 0857   MCH 20.1 (L) 04/01/2018 0857   MCHC 30.7 (L) 04/01/2018 0857   RDW 18.6 (H) 04/01/2018 0857   LYMPHSABS 2.2 04/01/2018 0857   EOSABS 0.3 04/01/2018 0857   BASOSABS 0.0 04/01/2018 0857   Iron/TIBC/Ferritin/ %Sat No results found for: IRON, TIBC, FERRITIN, IRONPCTSAT Lipid Panel     Component Value Date/Time   CHOL 223 (H) 12/15/2018 1210   TRIG 170 (H) 12/15/2018 1210   HDL 60 12/15/2018 1210   LDLCALC 129 (H) 12/15/2018 1210   Hepatic Function Panel     Component Value Date/Time   PROT 6.9 12/15/2018 1210   ALBUMIN 4.3 12/15/2018 1210   AST 24 12/15/2018 1210   ALT 24 12/15/2018 1210   ALKPHOS 70 12/15/2018 1210   BILITOT 0.5 12/15/2018 1210   No results found for: TSH    Ref. Range 12/15/2018 12:10  Vitamin D, 25-Hydroxy Latest Ref Range: 30.0 - 100.0 ng/mL 31.8    I, Doreene Nest, am acting as Location manager for [employer]. Owens Shark, DO  I have reviewed the above  documentation for accuracy and completeness, and I agree with the above. -Jearld Lesch, DO

## 2019-05-30 ENCOUNTER — Telehealth (INDEPENDENT_AMBULATORY_CARE_PROVIDER_SITE_OTHER): Payer: Medicare Other | Admitting: Bariatrics

## 2019-05-30 ENCOUNTER — Encounter (INDEPENDENT_AMBULATORY_CARE_PROVIDER_SITE_OTHER): Payer: Self-pay | Admitting: Bariatrics

## 2019-05-30 ENCOUNTER — Other Ambulatory Visit: Payer: Self-pay

## 2019-05-30 DIAGNOSIS — Z6841 Body Mass Index (BMI) 40.0 and over, adult: Secondary | ICD-10-CM

## 2019-05-30 DIAGNOSIS — I10 Essential (primary) hypertension: Secondary | ICD-10-CM | POA: Diagnosis not present

## 2019-05-30 DIAGNOSIS — E559 Vitamin D deficiency, unspecified: Secondary | ICD-10-CM | POA: Diagnosis not present

## 2019-05-30 MED ORDER — VITAMIN D (ERGOCALCIFEROL) 1.25 MG (50000 UNIT) PO CAPS: 50000.0000 [IU] | ORAL_CAPSULE | ORAL | 0 refills | Status: DC

## 2019-06-01 NOTE — Progress Notes (Signed)
Office: [phone]  /  Fax: [phone] TeleHealth Visit:  Kristina [name] has verbally consented to this TeleHealth visit today. The patient is located at home, the provider is located at the [facility] office. The participants in this visit include the listed provider and patient and any and all parties involved. The visit was conducted today via WebEx.  HPI:   Chief Complaint: OBESITY Kristina [name] is here to discuss her progress with her obesity treatment plan. She is on the Category 3 plan and is following her eating plan approximately 80 to 90 % of the time. She states she is walking 30 minutes 5 to 6 times per week. Kristina [name] states that she is down 4 pounds (weight 191 lbs-date not reported). Kristina [name] got a new puppy, and she is excited about that. She is doing well with meal planning. She is getting in adequate water and protein. We were unable to weigh the patient today for this TeleHealth visit. She feels as if she has lost weight since her last visit. She has lost 27 lbs since starting treatment with Kristina [name].  Vitamin D deficiency Ralene has a diagnosis of vitamin D deficiency. Michaline is currently taking vit D and she denies nausea, vomiting or muscle weakness.  Hypertension Kamyah Milliard is a 73 y.o. female with hypertension. She is taking HCTZ and Cozaar. Coye Kassam denies chest pain or shortness of breath on exertion. She is working weight loss to help control her blood pressure with the goal of decreasing her risk of heart attack and stroke. [name] blood pressure is well controlled.  ASSESSMENT AND PLAN:  Vitamin D deficiency - Plan: Vitamin D, Ergocalciferol, (DRISDOL) 1.25 MG (50000 UT) CAPS capsule  Essential hypertension  Class 3 severe obesity with serious comorbidity and body mass index (BMI) of 40.0 to 44.9 in adult, unspecified obesity type ([place])  PLAN:  Vitamin D Deficiency Kristina [name] was informed that low vitamin D levels  contributes to fatigue and are associated with obesity, breast, and colon cancer. She agrees to continue to take prescription Vit D @50 ,000 IU every week #4 with no refills and will follow up for routine testing of vitamin D, at least 2-3 times per year. She was informed of the risk of over-replacement of vitamin D and agrees to not increase her dose unless she discusses this with Kristina [name] first. Kristina [name] agrees to follow up with our clinic in 3 to 4 weeks.  Hypertension We discussed sodium restriction, working on healthy weight loss, and a regular exercise program as the means to achieve improved blood pressure control. [name] agreed with this plan and agreed to follow up as directed. We will continue to monitor her blood pressure as well as her progress with the above lifestyle modifications. She will continue her medications as prescribed and will watch for signs of hypotension as she continues her lifestyle modifications.  Obesity Kristina [name] is currently in the action stage of change. As such, her goal is to continue with weight loss efforts She has agreed to follow the Category 3 plan Kristina [name] will continue with exercise and she will increase her walking pace for weight loss and overall health benefits. We discussed the following Behavioral Modification Strategies today: planning for success, increase H2O intake, no skipping meals, keeping healthy foods in the home, increasing lean protein intake, decreasing simple carbohydrates, increasing vegetables, decrease eating out and work on meal planning and intentional eating Kristina [name] will continue following closely with the plan.  Kristina [name] has agreed to follow up with our  clinic in 3 to 4 weeks. She was informed of the importance of frequent follow up visits to maximize her success with intensive lifestyle modifications for her multiple health conditions.  ALLERGIES: Allergies  Allergen Reactions  . Penicillins Anaphylaxis and Itching     MEDICATIONS: Current Outpatient Medications on File Prior to Visit  Medication Sig Dispense Refill  . Chlorpheniramine-Pseudoeph (ALLEREST MAXIMUM STRENGTH PO) Take 1 tablet by mouth daily as needed.    . cyclobenzaprine (FLEXERIL) 10 MG tablet Take 10 mg by mouth 3 (three) times daily as needed for muscle spasms.    . hydrochlorothiazide (HYDRODIURIL) 25 MG tablet Take 25 mg by mouth daily. Take 1/2 tab qd    . losartan (COZAAR) 100 MG tablet Take 100 mg by mouth daily.    . montelukast (SINGULAIR) 10 MG tablet TAKE 1 TABLET(10 MG) BY MOUTH AT BEDTIME 90 tablet 0  . Multiple Vitamin (MULTIVITAMIN) capsule Take 1 capsule by mouth daily.    . Multiple Vitamins-Minerals (AIRBORNE GUMMIES PO) Take by mouth.    . pravastatin (PRAVACHOL) 10 MG tablet Take 10 mg by mouth daily.    [name] triamcinolone (NASACORT ALLERGY 24HR) 55 MCG/ACT AERO nasal inhaler Place 1 spray into the nose daily.     No current facility-administered medications on file prior to visit.     PAST MEDICAL HISTORY: Past Medical History:  Diagnosis Date  . Allergic rhinitis   . Anemia glaucoma  . Back pain   . DDD (degenerative disc disease), lumbar   . High blood pressure   . High cholesterol   . History of stomach ulcers   . Rheumatic fever   . Rheumatoid arteritis ([place])     PAST SURGICAL HISTORY: Past Surgical History:  Procedure Laterality Date  . APPENDECTOMY  1955  . CHOLECYSTECTOMY  1963  . HERNIA REPAIR  2007  . HERNIA REPAIR  2015  . REPLACEMENT TOTAL KNEE  2016  . TONSILLECTOMY      SOCIAL HISTORY: Social History   Tobacco Use  . Smoking status: Former Smoker    Packs/day: 1.50    Years: 30.00    Pack years: 45.00    Types: Cigarettes    Quit date: 2009    Years since quitting: 11.6  . Smokeless tobacco: Never Used  Substance Use Topics  . Alcohol use: Yes    Comment: social  . Drug use: Never    FAMILY HISTORY: Family History  Problem Relation Age of Onset  . Kidney disease Mother    . Hypertension Mother   . Emphysema Father   . Asthma Father   . Hypertension Father   . Heart disease Father   . COPD Brother     ROS: Review of Systems  Constitutional: Positive for weight loss.  Respiratory: Negative for shortness of breath (on exertion).   Cardiovascular: Negative for chest pain.  Gastrointestinal: Negative for nausea and vomiting.  Musculoskeletal:       Negative for muscle weakness    PHYSICAL EXAM: Pt in no acute distress  RECENT LABS AND TESTS: BMET    Component Value Date/Time   NA 140 12/15/2018 1210   K 4.6 12/15/2018 1210   CL 99 12/15/2018 1210   CO2 25 12/15/2018 1210   GLUCOSE 94 12/15/2018 1210   BUN 7 (L) 12/15/2018 1210   CREATININE 0.63 12/15/2018 1210   CALCIUM 9.5 12/15/2018 1210   GFRNONAA 90 12/15/2018 1210   GFRAA 104 12/15/2018 1210   Lab Results  Component Value  Date   HGBA1C 5.5 12/15/2018   Lab Results  Component Value Date   INSULIN 7.1 12/15/2018   CBC    Component Value Date/Time   WBC 6.2 04/01/2018 0857   RBC 5.71 (H) 04/01/2018 0857   HGB 11.5 04/01/2018 0857   HCT 37.5 04/01/2018 0857   PLT 292 04/01/2018 0857   MCV 66 (L) 04/01/2018 0857   MCH 20.1 (L) 04/01/2018 0857   MCHC 30.7 (L) 04/01/2018 0857   RDW 18.6 (H) 04/01/2018 0857   LYMPHSABS 2.2 04/01/2018 0857   EOSABS 0.3 04/01/2018 0857   BASOSABS 0.0 04/01/2018 0857   Iron/TIBC/Ferritin/ %Sat No results found for: IRON, TIBC, FERRITIN, IRONPCTSAT Lipid Panel     Component Value Date/Time   CHOL 223 (H) 12/15/2018 1210   TRIG 170 (H) 12/15/2018 1210   HDL 60 12/15/2018 1210   LDLCALC 129 (H) 12/15/2018 1210   Hepatic Function Panel     Component Value Date/Time   PROT 6.9 12/15/2018 1210   ALBUMIN 4.3 12/15/2018 1210   AST 24 12/15/2018 1210   ALT 24 12/15/2018 1210   ALKPHOS 70 12/15/2018 1210   BILITOT 0.5 12/15/2018 1210   No results found for: TSH    Ref. Range 12/15/2018 12:10  Vitamin D, 25-Hydroxy Latest Ref Range: 30.0 -  100.0 ng/mL 31.8    I, Doreene Nest, am acting as Location manager for [employer]. Owens Shark, DO  I have reviewed the above documentation for accuracy and completeness, and I agree with the above. -Jearld Lesch, DO

## 2019-06-20 ENCOUNTER — Other Ambulatory Visit: Payer: Self-pay

## 2019-06-20 ENCOUNTER — Telehealth (INDEPENDENT_AMBULATORY_CARE_PROVIDER_SITE_OTHER): Payer: Medicare Other | Admitting: Bariatrics

## 2019-06-20 ENCOUNTER — Encounter (INDEPENDENT_AMBULATORY_CARE_PROVIDER_SITE_OTHER): Payer: Self-pay | Admitting: Bariatrics

## 2019-06-20 DIAGNOSIS — Z6841 Body Mass Index (BMI) 40.0 and over, adult: Secondary | ICD-10-CM

## 2019-06-20 DIAGNOSIS — I10 Essential (primary) hypertension: Secondary | ICD-10-CM

## 2019-06-20 DIAGNOSIS — E559 Vitamin D deficiency, unspecified: Secondary | ICD-10-CM

## 2019-06-20 MED ORDER — VITAMIN D (ERGOCALCIFEROL) 1.25 MG (50000 UNIT) PO CAPS: 50000.0000 [IU] | ORAL_CAPSULE | ORAL | 0 refills | Status: DC

## 2019-06-22 NOTE — Progress Notes (Addendum)
Office: [phone]  /  Fax: [phone] TeleHealth Visit:  Kristina [name] has verbally consented to this TeleHealth visit today. The patient is located at home, the provider is located at the [facility] office. The participants in this visit include the listed provider and patient. Kristina [name] was unable to use realtime audiovisual technology today and the telehealth visit was conducted via telephone ( 25 minutes ).    HPI:   Chief Complaint: OBESITY Kristina [name] is here to discuss her progress with her obesity treatment plan. She is on the Category 3 plan and is following her eating plan approximately 80-85 % of the time. She states she is exercising 0 minutes 0 times per week. Kristina [name] is down 1 lb (190 lbs). She is in [place] (limitted activity).  We were unable to weigh the patient today for this TeleHealth visit. She feels as if she has lost 1 lb since her last visit. She has lost 28 lbs since starting treatment with [name].  Vitamin D Deficiency Kristina [name] has a diagnosis of vitamin D deficiency. She is currently taking prescription Vit D and denies nausea, vomiting or muscle weakness.  Hypertension Kristina [name] is a 73 y.o. female with hypertension. Avannah's blood pressure is well controlled. She denies lightheadedness. She is working on weight loss to help control her blood pressure with the goal of decreasing her risk of heart attack and stroke.  ASSESSMENT AND PLAN:  Vitamin D deficiency - Plan: Vitamin D, Ergocalciferol, (DRISDOL) 1.25 MG (50000 UT) CAPS capsule  Essential hypertension  Class 3 severe obesity with serious comorbidity and body mass index (BMI) of 40.0 to 44.9 in adult, unspecified obesity type ([place])  PLAN:  Vitamin D Deficiency Kristina [name] was informed that low vitamin D levels contributes to fatigue and are associated with obesity, breast, and colon cancer. Kristina [name] agrees to continue taking prescription Vit D 50,000 IU every week  #4 and we will refill for 1 month. She will follow up for routine testing of vitamin D, at least 2-3 times per year. She was informed of the risk of over-replacement of vitamin D and agrees to not increase her dose unless she discusses this with [name] first. Kristina [name] agrees to follow up with our clinic in 2 weeks.  Hypertension We discussed sodium restriction, working on healthy weight loss, and a regular exercise program as the means to achieve improved blood pressure control. Kristina [name] agreed with this plan and agreed to follow up as directed. We will continue to monitor her blood pressure as well as her progress with the above lifestyle modifications. Kristina [name] agrees to continue her blood pressure medications and will watch for signs of hypotension as she continues her lifestyle modifications. Kristina [name] agrees to follow up with our clinic in 2 weeks.  Obesity Kristina [name] is currently in the action stage of change. As such, her goal is to continue with weight loss efforts She has agreed to follow the Category 3 plan  Kristina [name] will stick to her diet plan. Kristina [name] has been instructed to work up to a goal of 150 minutes of combined cardio and strengthening exercise per week for weight loss and overall health benefits. She is to walk as she is able to increase NEAT (non-exercise activity thermogenesis). We discussed the following Behavioral Modification Strategies today: increasing lean protein intake, decreasing simple carbohydrates, increasing vegetables, decrease eating out, increase H20 intake, no skipping meals, work on meal planning and easy cooking plans, keeping healthy foods in the home, and planning for success  Kristina [name] has agreed to follow up with our clinic in 2 weeks. She was informed of the importance of frequent follow up visits to maximize her success with intensive lifestyle modifications for her multiple health conditions.  ALLERGIES: Allergies  Allergen Reactions  . Penicillins  Anaphylaxis and Itching    MEDICATIONS: Current Outpatient Medications on File Prior to Visit  Medication Sig Dispense Refill  . Chlorpheniramine-Pseudoeph (ALLEREST MAXIMUM STRENGTH PO) Take 1 tablet by mouth daily as needed.    . cyclobenzaprine (FLEXERIL) 10 MG tablet Take 10 mg by mouth 3 (three) times daily as needed for muscle spasms.    . hydrochlorothiazide (HYDRODIURIL) 25 MG tablet Take 25 mg by mouth daily. Take 1/2 tab qd    . losartan (COZAAR) 100 MG tablet Take 100 mg by mouth daily.    . montelukast (SINGULAIR) 10 MG tablet TAKE 1 TABLET(10 MG) BY MOUTH AT BEDTIME 90 tablet 0  . Multiple Vitamin (MULTIVITAMIN) capsule Take 1 capsule by mouth daily.    . Multiple Vitamins-Minerals (AIRBORNE GUMMIES PO) Take by mouth.    . pravastatin (PRAVACHOL) 10 MG tablet Take 10 mg by mouth daily.    [name] triamcinolone (NASACORT ALLERGY 24HR) 55 MCG/ACT AERO nasal inhaler Place 1 spray into the nose daily.     No current facility-administered medications on file prior to visit.     PAST MEDICAL HISTORY: Past Medical History:  Diagnosis Date  . Allergic rhinitis   . Anemia glaucoma  . Back pain   . DDD (degenerative disc disease), lumbar   . High blood pressure   . High cholesterol   . History of stomach ulcers   . Rheumatic fever   . Rheumatoid arteritis ([place])     PAST SURGICAL HISTORY: Past Surgical History:  Procedure Laterality Date  . APPENDECTOMY  1955  . CHOLECYSTECTOMY  1963  . HERNIA REPAIR  2007  . HERNIA REPAIR  2015  . REPLACEMENT TOTAL KNEE  2016  . TONSILLECTOMY      SOCIAL HISTORY: Social History   Tobacco Use  . Smoking status: Former Smoker    Packs/day: 1.50    Years: 30.00    Pack years: 45.00    Types: Cigarettes    Quit date: 2009    Years since quitting: 11.7  . Smokeless tobacco: Never Used  Substance Use Topics  . Alcohol use: Yes    Comment: social  . Drug use: Never    FAMILY HISTORY: Family History  Problem Relation Age of Onset   . Kidney disease Mother   . Hypertension Mother   . Emphysema Father   . Asthma Father   . Hypertension Father   . Heart disease Father   . COPD Brother     ROS: Review of Systems  Constitutional: Positive for weight loss.  Gastrointestinal: Negative for nausea and vomiting.  Musculoskeletal:       Negative muscle weakness  Neurological:       Negative lightheadedness    PHYSICAL EXAM: Pt in no acute distress  RECENT LABS AND TESTS: BMET    Component Value Date/Time   NA 140 12/15/2018 1210   K 4.6 12/15/2018 1210   CL 99 12/15/2018 1210   CO2 25 12/15/2018 1210   GLUCOSE 94 12/15/2018 1210   BUN 7 (L) 12/15/2018 1210   CREATININE 0.63 12/15/2018 1210   CALCIUM 9.5 12/15/2018 1210   GFRNONAA 90 12/15/2018 1210   GFRAA 104 12/15/2018 1210   Lab Results  Component Value Date  HGBA1C 5.5 12/15/2018   Lab Results  Component Value Date   INSULIN 7.1 12/15/2018   CBC    Component Value Date/Time   WBC 6.2 04/01/2018 0857   RBC 5.71 (H) 04/01/2018 0857   HGB 11.5 04/01/2018 0857   HCT 37.5 04/01/2018 0857   PLT 292 04/01/2018 0857   MCV 66 (L) 04/01/2018 0857   MCH 20.1 (L) 04/01/2018 0857   MCHC 30.7 (L) 04/01/2018 0857   RDW 18.6 (H) 04/01/2018 0857   LYMPHSABS 2.2 04/01/2018 0857   EOSABS 0.3 04/01/2018 0857   BASOSABS 0.0 04/01/2018 0857   Iron/TIBC/Ferritin/ %Sat No results found for: IRON, TIBC, FERRITIN, IRONPCTSAT Lipid Panel     Component Value Date/Time   CHOL 223 (H) 12/15/2018 1210   TRIG 170 (H) 12/15/2018 1210   HDL 60 12/15/2018 1210   LDLCALC 129 (H) 12/15/2018 1210   Hepatic Function Panel     Component Value Date/Time   PROT 6.9 12/15/2018 1210   ALBUMIN 4.3 12/15/2018 1210   AST 24 12/15/2018 1210   ALT 24 12/15/2018 1210   ALKPHOS 70 12/15/2018 1210   BILITOT 0.5 12/15/2018 1210   No results found for: TSH    I, Trixie Dredge, am acting as transcriptionist for [employer], DO  I have reviewed the above documentation  for accuracy and completeness, and I agree with the above. Jearld Lesch, DO

## 2019-07-04 ENCOUNTER — Telehealth (INDEPENDENT_AMBULATORY_CARE_PROVIDER_SITE_OTHER): Payer: Medicare Other | Admitting: Bariatrics

## 2019-07-04 ENCOUNTER — Other Ambulatory Visit: Payer: Self-pay

## 2019-07-04 ENCOUNTER — Encounter (INDEPENDENT_AMBULATORY_CARE_PROVIDER_SITE_OTHER): Payer: Self-pay | Admitting: Bariatrics

## 2019-07-04 DIAGNOSIS — I10 Essential (primary) hypertension: Secondary | ICD-10-CM

## 2019-07-04 DIAGNOSIS — Z6841 Body Mass Index (BMI) 40.0 and over, adult: Secondary | ICD-10-CM | POA: Diagnosis not present

## 2019-07-04 DIAGNOSIS — E7849 Other hyperlipidemia: Secondary | ICD-10-CM | POA: Diagnosis not present

## 2019-07-05 NOTE — Progress Notes (Signed)
Office: [phone]  /  Fax: [phone] TeleHealth Visit:  Kristina [name] has verbally consented to this TeleHealth visit today. The patient is located at home, the provider is located at the [facility] office. The participants in this visit include the listed provider and patient. The visit was conducted today via Webex.  HPI:   Chief Complaint: OBESITY Kristina [name] is here to discuss her progress with her obesity treatment plan. She is on the Category 3 plan and is following her eating plan approximately 75% of the time. She states she is exercising 0 minutes 0 times per week. Kristina [name] states that her weight remains the same (weight 190). She states that she is just now getting back into her routine as she has been out-of-town. We were unable to weigh the patient today for this TeleHealth visit. She feels as if she has maintained her weight since her last visit. She has lost 0 lbs since starting treatment with [name].  Hyperlipidemia Kristina [name] has hyperlipidemia and is taking Pravastatin. She has been trying to improve her cholesterol levels with intensive lifestyle modification including a low saturated fat diet, exercise and weight loss. She denies any chest pain, claudication or myalgias.  Hypertension Kristina [name] is a 73 y.o. female with hypertension and is taking HCTZ and Cozaar and states is well controlled. Kristina [name] denies chest pain or shortness of breath on exertion. She is working weight loss to help control her blood pressure with the goal of decreasing her risk of heart attack and stroke.  ASSESSMENT AND PLAN:  Other hyperlipidemia  Essential hypertension  Class 3 severe obesity with serious comorbidity and body mass index (BMI) of 40.0 to 44.9 in adult, unspecified obesity type ([place])  PLAN:  Hyperlipidemia Kristina [name] was informed of the American Heart Association Guidelines emphasizing intensive lifestyle modifications as the first line  treatment for hyperlipidemia. We discussed many lifestyle modifications today in depth, and Kristina [name] will continue to work on decreasing saturated fats such as fatty red meat, butter and many fried foods. She will continue medications, increase vegetables and lean protein in her diet, and continue to work on exercise and weight loss efforts.  Hypertension We discussed sodium restriction, working on healthy weight loss, and a regular exercise program as the means to achieve improved blood pressure control. Kristina [name] agreed with this plan and agreed to follow up as directed. We will continue to monitor her blood pressure as well as her progress with the above lifestyle modifications. She will continue her medications as prescribed and will watch for signs of hypotension as she continues her lifestyle modifications.  Obesity Kristina [name] is currently in the action stage of change. As such, her goal is to continue with weight loss efforts. She has agreed to follow the Category 3 plan. Kristina [name] will work on meal planning, intentional eating, and will resume exercise. Kristina [name] has been instructed to resume exercise for weight loss and overall health benefits. We discussed the following Behavioral Modification Strategies today: increasing lean protein intake, decreasing simple carbohydrates, increasing vegetables, increase H20 intake, decrease eating out, no skipping meals, work on meal planning and easy cooking plans, and keeping healthy foods in the home.  Kristina [name] has agreed to follow-up with our clinic in 2 weeks. She was informed of the importance of frequent follow-up visits to maximize her success with intensive lifestyle modifications for her multiple health conditions.  ALLERGIES: Allergies  Allergen Reactions  . Penicillins Anaphylaxis and Itching    MEDICATIONS: Current Outpatient Medications on File Prior  to Visit  Medication Sig Dispense Refill  . Chlorpheniramine-Pseudoeph (ALLEREST  MAXIMUM STRENGTH PO) Take 1 tablet by mouth daily as needed.    . cyclobenzaprine (FLEXERIL) 10 MG tablet Take 10 mg by mouth 3 (three) times daily as needed for muscle spasms.    . hydrochlorothiazide (HYDRODIURIL) 25 MG tablet Take 25 mg by mouth daily. Take 1/2 tab qd    . losartan (COZAAR) 100 MG tablet Take 100 mg by mouth daily.    . montelukast (SINGULAIR) 10 MG tablet TAKE 1 TABLET(10 MG) BY MOUTH AT BEDTIME 90 tablet 0  . Multiple Vitamin (MULTIVITAMIN) capsule Take 1 capsule by mouth daily.    . Multiple Vitamins-Minerals (AIRBORNE GUMMIES PO) Take by mouth.    . pravastatin (PRAVACHOL) 10 MG tablet Take 10 mg by mouth daily.    [name] triamcinolone (NASACORT ALLERGY 24HR) 55 MCG/ACT AERO nasal inhaler Place 1 spray into the nose daily.    . Vitamin D, Ergocalciferol, (DRISDOL) 1.25 MG (50000 UT) CAPS capsule Take 1 capsule (50,000 Units total) by mouth every 7 (seven) days. 4 capsule 0   No current facility-administered medications on file prior to visit.     PAST MEDICAL HISTORY: Past Medical History:  Diagnosis Date  . Allergic rhinitis   . Anemia glaucoma  . Back pain   . DDD (degenerative disc disease), lumbar   . High blood pressure   . High cholesterol   . History of stomach ulcers   . Rheumatic fever   . Rheumatoid arteritis ([place])     PAST SURGICAL HISTORY: Past Surgical History:  Procedure Laterality Date  . APPENDECTOMY  1955  . CHOLECYSTECTOMY  1963  . HERNIA REPAIR  2007  . HERNIA REPAIR  2015  . REPLACEMENT TOTAL KNEE  2016  . TONSILLECTOMY      SOCIAL HISTORY: Social History   Tobacco Use  . Smoking status: Former Smoker    Packs/day: 1.50    Years: 30.00    Pack years: 45.00    Types: Cigarettes    Quit date: 2009    Years since quitting: 11.7  . Smokeless tobacco: Never Used  Substance Use Topics  . Alcohol use: Yes    Comment: social  . Drug use: Never    FAMILY HISTORY: Family History  Problem Relation Age of Onset  . Kidney disease  Mother   . Hypertension Mother   . Emphysema Father   . Asthma Father   . Hypertension Father   . Heart disease Father   . COPD Brother    ROS: Review of Systems  Respiratory: Negative for shortness of breath.   Cardiovascular: Negative for chest pain and claudication.  Musculoskeletal: Negative for myalgias.   PHYSICAL EXAM: Pt in no acute distress  RECENT LABS AND TESTS: BMET    Component Value Date/Time   NA 140 12/15/2018 1210   K 4.6 12/15/2018 1210   CL 99 12/15/2018 1210   CO2 25 12/15/2018 1210   GLUCOSE 94 12/15/2018 1210   BUN 7 (L) 12/15/2018 1210   CREATININE 0.63 12/15/2018 1210   CALCIUM 9.5 12/15/2018 1210   GFRNONAA 90 12/15/2018 1210   GFRAA 104 12/15/2018 1210   Lab Results  Component Value Date   HGBA1C 5.5 12/15/2018   Lab Results  Component Value Date   INSULIN 7.1 12/15/2018   CBC    Component Value Date/Time   WBC 6.2 04/01/2018 0857   RBC 5.71 (H) 04/01/2018 0857   HGB 11.5 04/01/2018 0857  HCT 37.5 04/01/2018 0857   PLT 292 04/01/2018 0857   MCV 66 (L) 04/01/2018 0857   MCH 20.1 (L) 04/01/2018 0857   MCHC 30.7 (L) 04/01/2018 0857   RDW 18.6 (H) 04/01/2018 0857   LYMPHSABS 2.2 04/01/2018 0857   EOSABS 0.3 04/01/2018 0857   BASOSABS 0.0 04/01/2018 0857   Iron/TIBC/Ferritin/ %Sat No results found for: IRON, TIBC, FERRITIN, IRONPCTSAT Lipid Panel     Component Value Date/Time   CHOL 223 (H) 12/15/2018 1210   TRIG 170 (H) 12/15/2018 1210   HDL 60 12/15/2018 1210   LDLCALC 129 (H) 12/15/2018 1210   Hepatic Function Panel     Component Value Date/Time   PROT 6.9 12/15/2018 1210   ALBUMIN 4.3 12/15/2018 1210   AST 24 12/15/2018 1210   ALT 24 12/15/2018 1210   ALKPHOS 70 12/15/2018 1210   BILITOT 0.5 12/15/2018 1210   No results found for: TSH  Results for MELLODY, FLORENTINE (MRN [id number]) as of 07/05/2019 07:15  Ref. Range 12/15/2018 12:10  Vitamin D, 25-Hydroxy Latest Ref Range: 30.0 - 100.0 ng/mL 31.8   I, Michaelene Song, am acting as Location manager for [employer], DO  I have reviewed the above documentation for accuracy and completeness, and I agree with the above. -Jearld Lesch, DO

## 2019-07-17 ENCOUNTER — Telehealth: Payer: Self-pay | Admitting: Allergy and Immunology

## 2019-07-17 ENCOUNTER — Other Ambulatory Visit: Payer: Self-pay | Admitting: *Deleted

## 2019-07-17 MED ORDER — MONTELUKAST SODIUM 10 MG PO TABS: ORAL_TABLET | ORAL | 0 refills | Status: DC

## 2019-07-17 NOTE — Telephone Encounter (Signed)
A courtesy refill has been sent to the requested pharmacy.

## 2019-07-17 NOTE — Telephone Encounter (Signed)
[facility] called [name] to refill Montelukast for this patient. New pharmacy: [facility] on [address].

## 2019-07-18 ENCOUNTER — Telehealth (INDEPENDENT_AMBULATORY_CARE_PROVIDER_SITE_OTHER): Payer: Medicare Other | Admitting: Bariatrics

## 2019-07-18 ENCOUNTER — Other Ambulatory Visit: Payer: Self-pay

## 2019-07-18 ENCOUNTER — Encounter (INDEPENDENT_AMBULATORY_CARE_PROVIDER_SITE_OTHER): Payer: Self-pay | Admitting: Bariatrics

## 2019-07-18 DIAGNOSIS — E7849 Other hyperlipidemia: Secondary | ICD-10-CM

## 2019-07-18 DIAGNOSIS — Z6841 Body Mass Index (BMI) 40.0 and over, adult: Secondary | ICD-10-CM | POA: Diagnosis not present

## 2019-07-18 DIAGNOSIS — I10 Essential (primary) hypertension: Secondary | ICD-10-CM | POA: Diagnosis not present

## 2019-07-19 NOTE — Progress Notes (Addendum)
Office: [phone]  /  Fax: [phone] TeleHealth Visit:  Kristina [name] has verbally consented to this TeleHealth visit today. The patient is located at home, the provider is located at the [facility] office. The participants in this visit include the listed provider and patient. Kristina [name] was unable to use realtime audiovisual technology today and the telehealth visit was conducted via telephone ( 15 minutes ).    HPI:   Chief Complaint: OBESITY Kristina [name] is here to discuss her progress with her obesity treatment plan. She is on the Category 3 plan and is following her eating plan approximately 75 % of the time. She states she is exercising 0 minutes 0 times per week. Kristina [name] states that she has gained 1 lb (weight of 199 lbs today, which is up 9 lbs from her last reported weight of 190 lbs on 07/04/2019).  We were unable to weigh the patient today for this TeleHealth visit. She feels as if she has gained 9 lbs since her last visit. She has lost 0 lbs since starting treatment with [name].  Hypertension Kristina [name] is a 73 y.o. female with hypertension. Kristina [name] is taking Cozaar and hydrochlorothiazide. She denies chest pain. She is working on weight loss to help control her blood pressure with the goal of decreasing her risk of heart attack and stroke.   Hyperlipidemia Kristina [name] has hyperlipidemia and has been trying to improve her cholesterol levels with intensive lifestyle modification including a low saturated fat diet, exercise and weight loss. She is taking Pravachol and denies any chest pain, claudication or myalgias.  ASSESSMENT AND PLAN:  Essential hypertension  Other hyperlipidemia  Class 3 severe obesity with serious comorbidity and body mass index (BMI) of 40.0 to 44.9 in adult, unspecified obesity type ([place])  PLAN:  Hypertension We discussed sodium restriction, working on healthy weight loss, and a regular exercise program as the means to  achieve improved blood pressure control. Kristina [name] agreed with this plan and agreed to follow up as directed. We will continue to monitor her blood pressure as well as her progress with the above lifestyle modifications. Kristina [name] agrees to continue her medications and will watch for signs of hypotension as she continues her lifestyle modifications. Kristina [name] agrees to follow up with our clinic in 2 weeks.  Hyperlipidemia Kristina [name] was informed of the American Heart Association Guidelines emphasizing intensive lifestyle modifications as the first line treatment for hyperlipidemia. We discussed many lifestyle modifications today in depth, and Kristina [name] will continue to work on decreasing saturated fats such as fatty red meat, butter and many fried foods. She will also increase vegetables and lean protein in her diet and continue to work on exercise and weight loss efforts. Kristina [name] agrees to continue taking Pravachol and she agrees to follow up with our clinic in 2 weeks.  Obesity Kristina [name] is currently in the action stage of change. As such, her goal is to continue with weight loss efforts She has agreed to follow the Category 3 plan Kristina [name] has been instructed to work up to a goal of 150 minutes of combined cardio and strengthening exercise per week or get back with exercise (stationary bike and weights) for weight loss and overall health benefits. We discussed the following Behavioral Modification Strategies today: increasing lean protein intake, decreasing simple carbohydrates, increasing vegetables, decrease eating out, increase H20 intake, no skipping meals, work on meal planning and easy cooking plans, and keeping healthy foods in the home Kristina [name] is to be diligent when working for others.  Kristina [name] has agreed to follow up with our clinic in 2 weeks. She was informed of the importance of frequent follow up visits to maximize her success with intensive lifestyle modifications for her multiple  health conditions.  ALLERGIES: Allergies  Allergen Reactions  . Penicillins Anaphylaxis and Itching    MEDICATIONS: Current Outpatient Medications on File Prior to Visit  Medication Sig Dispense Refill  . Chlorpheniramine-Pseudoeph (ALLEREST MAXIMUM STRENGTH PO) Take 1 tablet by mouth daily as needed.    . cyclobenzaprine (FLEXERIL) 10 MG tablet Take 10 mg by mouth 3 (three) times daily as needed for muscle spasms.    . hydrochlorothiazide (HYDRODIURIL) 25 MG tablet Take 25 mg by mouth daily. Take 1/2 tab qd    . losartan (COZAAR) 100 MG tablet Take 100 mg by mouth daily.    . montelukast (SINGULAIR) 10 MG tablet TAKE 1 TABLET(10 MG) BY MOUTH AT BEDTIME 30 tablet 0  . Multiple Vitamin (MULTIVITAMIN) capsule Take 1 capsule by mouth daily.    . Multiple Vitamins-Minerals (AIRBORNE GUMMIES PO) Take by mouth.    . pravastatin (PRAVACHOL) 10 MG tablet Take 10 mg by mouth daily.    [name] triamcinolone (NASACORT ALLERGY 24HR) 55 MCG/ACT AERO nasal inhaler Place 1 spray into the nose daily.    . Vitamin D, Ergocalciferol, (DRISDOL) 1.25 MG (50000 UT) CAPS capsule Take 1 capsule (50,000 Units total) by mouth every 7 (seven) days. 4 capsule 0   No current facility-administered medications on file prior to visit.     PAST MEDICAL HISTORY: Past Medical History:  Diagnosis Date  . Allergic rhinitis   . Anemia glaucoma  . Back pain   . DDD (degenerative disc disease), lumbar   . High blood pressure   . High cholesterol   . History of stomach ulcers   . Rheumatic fever   . Rheumatoid arteritis ([place])     PAST SURGICAL HISTORY: Past Surgical History:  Procedure Laterality Date  . APPENDECTOMY  1955  . CHOLECYSTECTOMY  1963  . HERNIA REPAIR  2007  . HERNIA REPAIR  2015  . REPLACEMENT TOTAL KNEE  2016  . TONSILLECTOMY      SOCIAL HISTORY: Social History   Tobacco Use  . Smoking status: Former Smoker    Packs/day: 1.50    Years: 30.00    Pack years: 45.00    Types: Cigarettes     Quit date: 2009    Years since quitting: 11.7  . Smokeless tobacco: Never Used  Substance Use Topics  . Alcohol use: Yes    Comment: social  . Drug use: Never    FAMILY HISTORY: Family History  Problem Relation Age of Onset  . Kidney disease Mother   . Hypertension Mother   . Emphysema Father   . Asthma Father   . Hypertension Father   . Heart disease Father   . COPD Brother     ROS: Review of Systems  Constitutional: Negative for weight loss.  Cardiovascular: Negative for chest pain and claudication.  Musculoskeletal: Negative for myalgias.    PHYSICAL EXAM: Pt in no acute distress  RECENT LABS AND TESTS: BMET    Component Value Date/Time   NA 140 12/15/2018 1210   K 4.6 12/15/2018 1210   CL 99 12/15/2018 1210   CO2 25 12/15/2018 1210   GLUCOSE 94 12/15/2018 1210   BUN 7 (L) 12/15/2018 1210   CREATININE 0.63 12/15/2018 1210   CALCIUM 9.5 12/15/2018 1210   GFRNONAA 90 12/15/2018 1210  GFRAA 104 12/15/2018 1210   Lab Results  Component Value Date   HGBA1C 5.5 12/15/2018   Lab Results  Component Value Date   INSULIN 7.1 12/15/2018   CBC    Component Value Date/Time   WBC 6.2 04/01/2018 0857   RBC 5.71 (H) 04/01/2018 0857   HGB 11.5 04/01/2018 0857   HCT 37.5 04/01/2018 0857   PLT 292 04/01/2018 0857   MCV 66 (L) 04/01/2018 0857   MCH 20.1 (L) 04/01/2018 0857   MCHC 30.7 (L) 04/01/2018 0857   RDW 18.6 (H) 04/01/2018 0857   LYMPHSABS 2.2 04/01/2018 0857   EOSABS 0.3 04/01/2018 0857   BASOSABS 0.0 04/01/2018 0857   Iron/TIBC/Ferritin/ %Sat No results found for: IRON, TIBC, FERRITIN, IRONPCTSAT Lipid Panel     Component Value Date/Time   CHOL 223 (H) 12/15/2018 1210   TRIG 170 (H) 12/15/2018 1210   HDL 60 12/15/2018 1210   LDLCALC 129 (H) 12/15/2018 1210   Hepatic Function Panel     Component Value Date/Time   PROT 6.9 12/15/2018 1210   ALBUMIN 4.3 12/15/2018 1210   AST 24 12/15/2018 1210   ALT 24 12/15/2018 1210   ALKPHOS 70  12/15/2018 1210   BILITOT 0.5 12/15/2018 1210   No results found for: TSH    I, Trixie Dredge, am acting as transcriptionist for [employer], DO  I have reviewed the above documentation for accuracy and completeness, and I agree with the above. Jearld Lesch, DO

## 2019-08-03 ENCOUNTER — Encounter (INDEPENDENT_AMBULATORY_CARE_PROVIDER_SITE_OTHER): Payer: Self-pay | Admitting: Bariatrics

## 2019-08-03 ENCOUNTER — Other Ambulatory Visit: Payer: Self-pay

## 2019-08-03 ENCOUNTER — Ambulatory Visit (INDEPENDENT_AMBULATORY_CARE_PROVIDER_SITE_OTHER): Payer: Medicare Other | Admitting: Bariatrics

## 2019-08-03 DIAGNOSIS — Z6841 Body Mass Index (BMI) 40.0 and over, adult: Secondary | ICD-10-CM

## 2019-08-03 DIAGNOSIS — I10 Essential (primary) hypertension: Secondary | ICD-10-CM

## 2019-08-03 DIAGNOSIS — E559 Vitamin D deficiency, unspecified: Secondary | ICD-10-CM | POA: Diagnosis not present

## 2019-08-03 MED ORDER — VITAMIN D (ERGOCALCIFEROL) 1.25 MG (50000 UNIT) PO CAPS: 50000.0000 [IU] | ORAL_CAPSULE | ORAL | 0 refills | Status: DC

## 2019-08-03 NOTE — Progress Notes (Signed)
Office: [phone]  /  Fax: [phone] TeleHealth Visit:  Kristina [name] has verbally consented to this TeleHealth visit today. The patient is located at home, the provider is located at the [facility] office. The participants in this visit include the listed provider and patient and any and all parties involved. The visit was conducted today via WebEx.  HPI:   Chief Complaint: OBESITY Kristina [name] is here to discuss her progress with her obesity treatment plan. She is on the Category 3 plan and is following her eating plan approximately 80 % of the time. She states she is walking 30 to 40 minutes 2 to 3 times per week. Kristina [name] is down two pounds. She has been busy at home and working at home. We were unable to weigh the patient today for this TeleHealth visit. She feels as if she has lost weight since her last visit. She has lost 0 lbs since starting treatment with [name].  Vitamin D deficiency Kristina [name] has a diagnosis of vitamin D deficiency. Kristina [name] is currently taking vit D and she denies nausea, vomiting or muscle weakness.  Hypertension Kristina [name] is a 73 y.o. female with hypertension. Kristina [name] denies lightheadedness. She is working weight loss to help control her blood pressure with the goal of decreasing her risk of heart attack and stroke. Kristina [name] blood pressure is well controlled.  ASSESSMENT AND PLAN:  Vitamin D deficiency - Plan: Vitamin D, Ergocalciferol, (DRISDOL) 1.25 MG (50000 UT) CAPS capsule  Essential hypertension  Class 3 severe obesity with serious comorbidity and body mass index (BMI) of 40.0 to 44.9 in adult, unspecified obesity type ([place])  PLAN:  Vitamin D Deficiency Kristina [name] was informed that low vitamin D levels contributes to fatigue and are associated with obesity, breast, and colon cancer. Daizy agrees to continue to take prescription Vit D @50 ,000 IU every week #4 with no refills and she will follow up for  routine testing of vitamin D, at least 2-3 times per year. She was informed of the risk of over-replacement of vitamin D and agrees to not increase her dose unless she discusses this with [name] first. Kristina [name] agrees to follow up with our clinic in 2 to 3 weeks.    Hypertension We discussed sodium restriction, working on healthy weight loss, and a regular exercise program as the means to achieve improved blood pressure control. Kristina [name] agreed with this plan and agreed to follow up as directed. We will continue to monitor her blood pressure as well as her progress with the above lifestyle modifications. She will continue her medications as prescribed and will watch for signs of hypotension as she continues her lifestyle modifications.  Obesity Kristina [name] is currently in the action stage of change. As such, her goal is to continue with weight loss efforts She has agreed to follow the Category 3 plan Kristina [name] will continue walking 30 to 40 minutes, 2 to 3 times per week for weight loss and overall health benefits. We discussed the following Behavioral Modification Strategies today: planning for success, increase H2O intake, no skipping meals, keeping healthy foods in the home, increasing lean protein intake, decreasing simple carbohydrates, decrease eating out and work on meal planning and intentional eating  Kristina [name] has agreed to follow up with our clinic in 2 to 3 weeks. She was informed of the importance of frequent follow up visits to maximize her success with intensive lifestyle modifications for her multiple health conditions.  ALLERGIES: Allergies  Allergen Reactions   Penicillins Anaphylaxis and Itching  MEDICATIONS: Current Outpatient Medications on File Prior to Visit  Medication Sig Dispense Refill   Chlorpheniramine-Pseudoeph (ALLEREST MAXIMUM STRENGTH PO) Take 1 tablet by mouth daily as needed.     cyclobenzaprine (FLEXERIL) 10 MG tablet Take 10 mg by mouth 3 (three) times  daily as needed for muscle spasms.     hydrochlorothiazide (HYDRODIURIL) 25 MG tablet Take 25 mg by mouth daily. Take 1/2 tab qd     losartan (COZAAR) 100 MG tablet Take 100 mg by mouth daily.     montelukast (SINGULAIR) 10 MG tablet TAKE 1 TABLET(10 MG) BY MOUTH AT BEDTIME 30 tablet 0   Multiple Vitamin (MULTIVITAMIN) capsule Take 1 capsule by mouth daily.     Multiple Vitamins-Minerals (AIRBORNE GUMMIES PO) Take by mouth.     pravastatin (PRAVACHOL) 10 MG tablet Take 10 mg by mouth daily.     triamcinolone (NASACORT ALLERGY 24HR) 55 MCG/ACT AERO nasal inhaler Place 1 spray into the nose daily.     No current facility-administered medications on file prior to visit.     PAST MEDICAL HISTORY: Past Medical History:  Diagnosis Date   Allergic rhinitis    Anemia glaucoma   Back pain    DDD (degenerative disc disease), lumbar    High blood pressure    High cholesterol    History of stomach ulcers    Rheumatic fever    Rheumatoid arteritis ([place])     PAST SURGICAL HISTORY: Past Surgical History:  Procedure Laterality Date   [place]   HERNIA REPAIR  2007   HERNIA REPAIR  2015   REPLACEMENT TOTAL KNEE  2016   TONSILLECTOMY      SOCIAL HISTORY: Social History   Tobacco Use   Smoking status: Former Smoker    Packs/day: 1.50    Years: 30.00    Pack years: 45.00    Types: Cigarettes    Quit date: 2009    Years since quitting: 11.8   Smokeless tobacco: Never Used  Substance Use Topics   Alcohol use: Yes    Comment: social   Drug use: Never    FAMILY HISTORY: Family History  Problem Relation Age of Onset   Kidney disease Mother    Hypertension Mother    Emphysema Father    Asthma Father    Hypertension Father    Heart disease Father    COPD Brother     ROS: Review of Systems  Constitutional: Positive for weight loss.  Gastrointestinal: Negative for nausea and vomiting.  Musculoskeletal:        Negative for muscle weakness  Neurological:       Negative for lightheadedness    PHYSICAL EXAM: Pt in no acute distress  RECENT LABS AND TESTS: BMET    Component Value Date/Time   NA 140 12/15/2018 1210   K 4.6 12/15/2018 1210   CL 99 12/15/2018 1210   CO2 25 12/15/2018 1210   GLUCOSE 94 12/15/2018 1210   BUN 7 (L) 12/15/2018 1210   CREATININE 0.63 12/15/2018 1210   CALCIUM 9.5 12/15/2018 1210   GFRNONAA 90 12/15/2018 1210   GFRAA 104 12/15/2018 1210   Lab Results  Component Value Date   HGBA1C 5.5 12/15/2018   Lab Results  Component Value Date   INSULIN 7.1 12/15/2018   CBC    Component Value Date/Time   WBC 6.2 04/01/2018 0857   RBC 5.71 (H) 04/01/2018 0857   HGB 11.5 04/01/2018 0857   HCT  37.5 04/01/2018 0857   PLT 292 04/01/2018 0857   MCV 66 (L) 04/01/2018 0857   MCH 20.1 (L) 04/01/2018 0857   MCHC 30.7 (L) 04/01/2018 0857   RDW 18.6 (H) 04/01/2018 0857   LYMPHSABS 2.2 04/01/2018 0857   EOSABS 0.3 04/01/2018 0857   BASOSABS 0.0 04/01/2018 0857   Iron/TIBC/Ferritin/ %Sat No results found for: IRON, TIBC, FERRITIN, IRONPCTSAT Lipid Panel     Component Value Date/Time   CHOL 223 (H) 12/15/2018 1210   TRIG 170 (H) 12/15/2018 1210   HDL 60 12/15/2018 1210   LDLCALC 129 (H) 12/15/2018 1210   Hepatic Function Panel     Component Value Date/Time   PROT 6.9 12/15/2018 1210   ALBUMIN 4.3 12/15/2018 1210   AST 24 12/15/2018 1210   ALT 24 12/15/2018 1210   ALKPHOS 70 12/15/2018 1210   BILITOT 0.5 12/15/2018 1210   No results found for: TSH    Ref. Range 12/15/2018 12:10  Vitamin D, 25-Hydroxy Latest Ref Range: 30.0 - 100.0 ng/mL 31.8    I, Doreene Nest, am acting as Location manager for [employer]. Owens Shark, DO  I have reviewed the above documentation for accuracy and completeness, and I agree with the above. -Jearld Lesch, DO

## 2019-08-24 ENCOUNTER — Telehealth (INDEPENDENT_AMBULATORY_CARE_PROVIDER_SITE_OTHER): Payer: Medicare Other | Admitting: Bariatrics

## 2019-08-24 ENCOUNTER — Other Ambulatory Visit: Payer: Self-pay

## 2019-08-24 DIAGNOSIS — M549 Dorsalgia, unspecified: Secondary | ICD-10-CM | POA: Diagnosis not present

## 2019-08-24 DIAGNOSIS — G8929 Other chronic pain: Secondary | ICD-10-CM

## 2019-08-24 DIAGNOSIS — E559 Vitamin D deficiency, unspecified: Secondary | ICD-10-CM

## 2019-08-24 DIAGNOSIS — Z6835 Body mass index (BMI) 35.0-35.9, adult: Secondary | ICD-10-CM

## 2019-08-24 MED ORDER — VITAMIN D (ERGOCALCIFEROL) 1.25 MG (50000 UNIT) PO CAPS: 50000.0000 [IU] | ORAL_CAPSULE | ORAL | 0 refills | Status: DC

## 2019-08-28 NOTE — Progress Notes (Signed)
Office: [phone]  /  Fax: [phone] TeleHealth Visit:  Kristina [name] has verbally consented to this TeleHealth visit today. The patient is located at home, the provider is located at the [employer] [facility] office. The participants in this visit include the listed provider and patient. The visit was conducted today via Webex.  HPI:   Chief Complaint: OBESITY Kristina [name] is here to discuss her progress with her obesity treatment plan. She is on the Category 3 plan and is following her eating plan approximately 80-85% of the time. She states she is walking 30-40 minutes 5-7 times per week. Kristina [name] states that she has lost 2 lbs (weight 187). We were unable to weigh the patient today for this TeleHealth visit. She feels as if she has lost 2 lbs since her last visit. She has lost 0 lbs since starting treatment with [name].  Vitamin D deficiency Kristina [name] has a diagnosis of Vitamin D deficiency. She is currently taking prescription Vit D with no side effects and denies nausea, vomiting or muscle weakness.  Chronic Back Pain Kristina [name] has chronic back pain and is taking Flexeril. She reports it is much improved.  ASSESSMENT AND PLAN:  Vitamin D deficiency - Plan: Vitamin D, Ergocalciferol, (DRISDOL) 1.25 MG (50000 UT) CAPS capsule  Chronic back pain, unspecified back location, unspecified back pain laterality  Class 2 severe obesity with serious comorbidity and body mass index (BMI) of 35.0 to 35.9 in adult, unspecified obesity type ([place])  PLAN:  Vitamin D Deficiency Kristina [name] was informed that low Vitamin D levels contributes to fatigue and are associated with obesity, breast, and colon cancer. She agrees to continue to take prescription Vit D @ 50,000 IU every week #4 with 0 refills and will follow-up for routine testing of Vitamin D, at least 2-3 times per year. She was informed of the risk of over-replacement of Vitamin D and agrees to not increase her dose unless she  discusses this with [name] first. Martavia agrees to follow-up with our clinic in 2-3 weeks.  Chronic Back Pain Kristina [name] will continue to walk and will not do any pounding exercises.  Obesity Kristina [name] is currently in the action stage of change. As such, her goal is to continue with weight loss efforts. She has agreed to follow the Category 3 plan. Kristina [name] will work on meal planning and will stay consistent and adherent to the plan. She was given Thanksgiving handout. Kristina [name] has been instructed to continue walking (1 short walk and then a longer walk doing bands and weights) for weight loss and overall health benefits. We discussed the following Behavioral Modification Strategies today: increasing lean protein intake, decreasing simple carbohydrates, increasing vegetables, increase H20 intake, decrease eating out, no skipping meals, work on meal planning and easy cooking plans, keeping healthy foods in the home, and planning for success.  Kristina [name] has agreed to follow-up with our clinic in 2-3 weeks. She was informed of the importance of frequent follow-up visits to maximize her success with intensive lifestyle modifications for her multiple health conditions.  ALLERGIES: Allergies  Allergen Reactions  . Penicillins Anaphylaxis and Itching    MEDICATIONS: Current Outpatient Medications on File Prior to Visit  Medication Sig Dispense Refill  . Chlorpheniramine-Pseudoeph (ALLEREST MAXIMUM STRENGTH PO) Take 1 tablet by mouth daily as needed.    . cyclobenzaprine (FLEXERIL) 10 MG tablet Take 10 mg by mouth 3 (three) times daily as needed for muscle spasms.    . hydrochlorothiazide (HYDRODIURIL) 25 MG tablet Take 25 mg by mouth  daily. Take 1/2 tab qd    . losartan (COZAAR) 100 MG tablet Take 100 mg by mouth daily.    . montelukast (SINGULAIR) 10 MG tablet TAKE 1 TABLET(10 MG) BY MOUTH AT BEDTIME 30 tablet 0  . Multiple Vitamin (MULTIVITAMIN) capsule Take 1 capsule by mouth daily.    .  Multiple Vitamins-Minerals (AIRBORNE GUMMIES PO) Take by mouth.    . pravastatin (PRAVACHOL) 10 MG tablet Take 10 mg by mouth daily.    [name] triamcinolone (NASACORT ALLERGY 24HR) 55 MCG/ACT AERO nasal inhaler Place 1 spray into the nose daily.     No current facility-administered medications on file prior to visit.     PAST MEDICAL HISTORY: Past Medical History:  Diagnosis Date  . Allergic rhinitis   . Anemia glaucoma  . Back pain   . DDD (degenerative disc disease), lumbar   . High blood pressure   . High cholesterol   . History of stomach ulcers   . Rheumatic fever   . Rheumatoid arteritis ([place])     PAST SURGICAL HISTORY: Past Surgical History:  Procedure Laterality Date  . APPENDECTOMY  1955  . CHOLECYSTECTOMY  1963  . HERNIA REPAIR  2007  . HERNIA REPAIR  2015  . REPLACEMENT TOTAL KNEE  2016  . TONSILLECTOMY      SOCIAL HISTORY: Social History   Tobacco Use  . Smoking status: Former Smoker    Packs/day: 1.50    Years: 30.00    Pack years: 45.00    Types: Cigarettes    Quit date: 2009    Years since quitting: 11.9  . Smokeless tobacco: Never Used  Substance Use Topics  . Alcohol use: Yes    Comment: social  . Drug use: Never    FAMILY HISTORY: Family History  Problem Relation Age of Onset  . Kidney disease Mother   . Hypertension Mother   . Emphysema Father   . Asthma Father   . Hypertension Father   . Heart disease Father   . COPD Brother    ROS: Review of Systems  Gastrointestinal: Negative for nausea and vomiting.  Musculoskeletal: Positive for back pain (chronic).       Negative for muscle weakness.   PHYSICAL EXAM: Pt in no acute distress  RECENT LABS AND TESTS: BMET    Component Value Date/Time   NA 140 12/15/2018 1210   K 4.6 12/15/2018 1210   CL 99 12/15/2018 1210   CO2 25 12/15/2018 1210   GLUCOSE 94 12/15/2018 1210   BUN 7 (L) 12/15/2018 1210   CREATININE 0.63 12/15/2018 1210   CALCIUM 9.5 12/15/2018 1210   GFRNONAA 90  12/15/2018 1210   GFRAA 104 12/15/2018 1210   Lab Results  Component Value Date   HGBA1C 5.5 12/15/2018   Lab Results  Component Value Date   INSULIN 7.1 12/15/2018   CBC    Component Value Date/Time   WBC 6.2 04/01/2018 0857   RBC 5.71 (H) 04/01/2018 0857   HGB 11.5 04/01/2018 0857   HCT 37.5 04/01/2018 0857   PLT 292 04/01/2018 0857   MCV 66 (L) 04/01/2018 0857   MCH 20.1 (L) 04/01/2018 0857   MCHC 30.7 (L) 04/01/2018 0857   RDW 18.6 (H) 04/01/2018 0857   LYMPHSABS 2.2 04/01/2018 0857   EOSABS 0.3 04/01/2018 0857   BASOSABS 0.0 04/01/2018 0857   Iron/TIBC/Ferritin/ %Sat No results found for: IRON, TIBC, FERRITIN, IRONPCTSAT Lipid Panel     Component Value Date/Time   CHOL 223 (  H) 12/15/2018 1210   TRIG 170 (H) 12/15/2018 1210   HDL 60 12/15/2018 1210   LDLCALC 129 (H) 12/15/2018 1210   Hepatic Function Panel     Component Value Date/Time   PROT 6.9 12/15/2018 1210   ALBUMIN 4.3 12/15/2018 1210   AST 24 12/15/2018 1210   ALT 24 12/15/2018 1210   ALKPHOS 70 12/15/2018 1210   BILITOT 0.5 12/15/2018 1210   No results found for: TSH  Results for SAMEKA, MEENACH (MRN [id number]) as of 08/28/2019 13:56  Ref. Range 12/15/2018 12:10  Vitamin D, 25-Hydroxy Latest Ref Range: 30.0 - 100.0 ng/mL 31.8   I, Michaelene Song, am acting as Location manager for [employer], DO  I have reviewed the above documentation for accuracy and completeness, and I agree with the above. -Jearld Lesch, DO

## 2019-09-04 NOTE — Progress Notes (Addendum)
[facility] Note  09/05/2019     CHIEF COMPLAINT Patient presents for Retina Evaluation   HISTORY OF PRESENT ILLNESS: Kristina [name] is a 73 y.o. female who presents to the clinic today for:   HPI    Retina Evaluation    In left eye.  This started 4 months ago.  Duration of 4 months.  Associated Symptoms Flashes, Blind Spot and Floaters.  Negative for Photophobia, Scalp Tenderness, Fever, Pain, Glare, Jaw Claudication, Weight Loss, Distortion, Trauma, Shoulder/Hip pain, Fatigue and Redness.  Context:  distance vision, mid-range vision and near vision.  Treatments tried include no treatments.  I, the attending physician,  performed the HPI with the patient and updated documentation appropriately.          Comments    Patient c/o transient gray curtain over vision OS, starting 3-4 months ago. For the past 4-6 weeks, symptom has occurred more frequently. Last week, on 11.25.20, lost most of vision OS for about 5 minutes and then vision returned. Starts in inferior vision and moves upward. Saw flashes OS on the same day. Has floaters OU, but floaters have been present for years and no increase recently. On same day of vision loss OS, patient had headache above OS. Patient has never had heart attack or stroke. Denies jaw pain, scalp tenderness.        Last edited by Bernarda Caffey, MD on 09/05/2019  1:33 PM. (History)    pt states for 3-4 months she has been experiencing transient vision loss, she states it looks like a gray curtain that comes from the bottom of her vision, she states it covers the entire bottom half of her vision, but she can see above it, she states right before thanksgiving it seemed to be happening more often, at least once a day, she states the Wednesday before Thanksgiving she was talking to her daughter and the curtain came up and covered almost her daughter's entire face, she states it happened again that evening and she also saw flashes  of light "like lightening" she has had these episodes periodically, but not every day since then, her last episode was yesterday, she states they usually last about a minute and go away if she rests her eyes, she states the episode last Wednesday was accompanied by a headache, she takes medication for high blood pressure and cholesterol, pt states she has occasional numbness and tingling in her right hand, she states her toes feel numb, but she doesn't have any trouble walking  Referring physician: [place], [place], [place],  [place] [address]  HISTORICAL INFORMATION:   Selected notes from the MEDICAL RECORD NUMBER Referred by Dr. Parke Simmers for eval of transient loss of vision OS.   CURRENT MEDICATIONS: No current outpatient medications on file. (Ophthalmic Drugs)   No current facility-administered medications for this visit.  (Ophthalmic Drugs)   Current Outpatient Medications (Other)  Medication Sig  . Chlorpheniramine-Pseudoeph (ALLEREST MAXIMUM STRENGTH PO) Take 1 tablet by mouth daily as needed.  . cholecalciferol (VITAMIN D3) 25 MCG (1000 UT) tablet Take 1,000 Units by mouth once a week.  . cyclobenzaprine (FLEXERIL) 10 MG tablet Take 10 mg by mouth 3 (three) times daily as needed for muscle spasms.  [name] GLUCOSAMINE-CHONDROITIN PO Take 1 tablet by mouth daily.  . hydrochlorothiazide (HYDRODIURIL) 25 MG tablet Take 25 mg by mouth daily. Take 1/2 tab qd  . losartan (COZAAR) 100 MG tablet Take 100 mg by mouth daily.  [name]  montelukast (SINGULAIR) 10 MG tablet TAKE 1 TABLET(10 MG) BY MOUTH AT BEDTIME  . Multiple Vitamin (MULTIVITAMIN) capsule Take 1 capsule by mouth daily.  . Multiple Vitamins-Minerals (AIRBORNE GUMMIES PO) Take by mouth.  . nabumetone (RELAFEN) 500 MG tablet Take 500 mg by mouth as needed.  . pravastatin (PRAVACHOL) 10 MG tablet Take 10 mg by mouth daily.  [name] triamcinolone (NASACORT ALLERGY 24HR) 55 MCG/ACT AERO nasal inhaler Place 1 spray into the nose daily.   . Vitamin D, Ergocalciferol, (DRISDOL) 1.25 MG (50000 UT) CAPS capsule Take 1 capsule (50,000 Units total) by mouth every 7 (seven) days.   No current facility-administered medications for this visit.  (Other)      REVIEW OF SYSTEMS: ROS    Positive for: Eyes   Negative for: Constitutional, Gastrointestinal, Neurological, Skin, Genitourinary, Musculoskeletal, HENT, Endocrine, Cardiovascular, Respiratory, Psychiatric, Allergic/Imm, Heme/Lymph   Last edited by Roselee Nova D, COT on 09/05/2019  1:11 PM. (History)       ALLERGIES Allergies  Allergen Reactions  . Penicillins Anaphylaxis and Itching    PAST MEDICAL HISTORY Past Medical History:  Diagnosis Date  . Allergic rhinitis   . Anemia glaucoma  . Back pain   . DDD (degenerative disc disease), lumbar   . High blood pressure   . High cholesterol   . History of stomach ulcers   . Rheumatic fever   . Rheumatoid arteritis ([place])    Past Surgical History:  Procedure Laterality Date  . APPENDECTOMY  1955  . CHOLECYSTECTOMY  1963  . HERNIA REPAIR  2007  . HERNIA REPAIR  2015  . REPLACEMENT TOTAL KNEE  2016  . TONSILLECTOMY      FAMILY HISTORY Family History  Problem Relation Age of Onset  . Kidney disease Mother   . Hypertension Mother   . Glaucoma Mother   . Emphysema Father   . Asthma Father   . Hypertension Father   . Heart disease Father   . COPD Brother     SOCIAL HISTORY Social History   Tobacco Use  . Smoking status: Former Smoker    Packs/day: 1.50    Years: 30.00    Pack years: 45.00    Types: Cigarettes    Quit date: 2009    Years since quitting: 11.9  . Smokeless tobacco: Never Used  Substance Use Topics  . Alcohol use: Yes    Comment: social  . Drug use: Never         OPHTHALMIC EXAM:  Base Eye Exam    Visual Acuity (Snellen - Linear)      Right Left   Dist cc 20/25 -2 20/30 +2   Dist ph cc 20/25 +2 20/20 -1   Correction: Glasses       Tonometry (Tonopen, 1:19 PM)       Right Left   Pressure 20 19       Pupils      Dark Light Shape React APD   Right 4 2 Round Brisk None   Left 4 2 Round Brisk None       Visual Fields (Counting fingers)      Left Right    Full Full       Extraocular Movement      Right Left    Full, Ortho Full, Ortho       Neuro/Psych    Oriented x3: Yes   Mood/Affect: Normal       Dilation    Both eyes: 1.0% Mydriacyl, 2.5% Phenylephrine @  1:24 PM        Slit Lamp and Fundus Exam    Slit Lamp Exam      Right Left   Lids/Lashes Dermatochalasis - upper lid, Meibomian gland dysfunction, mild Telangiectasia Dermatochalasis - upper lid, Meibomian gland dysfunction, mild Telangiectasia   Conjunctiva/Sclera White and quiet Mild conj cysts at 0400, otherwise white and quiet   Cornea mild EBMD, trace Punctate epithelial erosions mild EBMD, trace Punctate epithelial erosions   Anterior Chamber Deep and quiet Deep and quiet   Iris Round and dilated Round and dilated   Lens 2-3+ Nuclear sclerosis with mild brunescence, 2+ Cortical cataract 2-3+ Nuclear sclerosis with mild brunescence, 2+ Cortical cataract   Vitreous Mild Vitreous syneresis Mild Vitreous syneresis       Fundus Exam      Right Left   Disc Pink and Sharp, narrow inferior rim Pink and Sharp, +cupping, mild temporal PPP   C/D Ratio 0.65 0.65   Macula Flat, Blunted foveal reflex, mild Retinal pigment epithelial mottling, No heme or edema Flat, Blunted foveal reflex, mild Retinal pigment epithelial mottling, No heme or edema   Vessels Mild Vascular attenuation, AV crossing changes Vascular attenuation, AV crossing changes   Periphery Attached    Attached           Refraction    Wearing Rx      Sphere Cylinder Axis   Right -0.50 +1.00 013   Left [place] +1.25 157       Manifest Refraction      Sphere Cylinder Axis Dist VA   Right -0.75 +0.50 020 20/25-1   Left +0.25 +1.25 157 20/25-1          IMAGING AND PROCEDURES  Imaging and Procedures for  _0 @  OCT, Retina - OU - Both Eyes       Right Eye Quality was good. Central Foveal Thickness: 269. Progression has no prior data. Findings include normal foveal contour, no IRF, no SRF.   Left Eye Quality was good. Central Foveal Thickness: 261. Progression has no prior data. Findings include normal foveal contour, no IRF, no SRF.   Notes *Images captured and stored on drive  Diagnosis / Impression:  NFP, no IRF/SRF OU  Clinical management:  See below  Abbreviations: NFP - Normal foveal profile. CME - cystoid macular edema. PED - pigment epithelial detachment. IRF - intraretinal fluid. SRF - subretinal fluid. EZ - ellipsoid zone. ERM - epiretinal membrane. ORA - outer retinal atrophy. ORT - outer retinal tubulation. SRHM - subretinal hyper-reflective material        Fluorescein Angiography Optos (Transit OS)       Right Eye   Progression has no prior data. Early phase findings include normal observations. Mid/Late phase findings include normal observations.   Left Eye   Progression has no prior data. Early phase findings include delayed filling. Mid/Late phase findings include normal observations.   Notes **Images stored on drive**  Impression: OD: normal study OS: slightly delayed filling time                ASSESSMENT/PLAN:    ICD-10-CM   1. Transient vision disturbance of left eye  H53.9   2. Retinal edema  H35.81 OCT, Retina - OU - Both Eyes  3. Essential hypertension  I10   4. Hypertensive retinopathy of both eyes  H35.033 Fluorescein Angiography Optos (Transit OS)    CBC    Sedimentation rate    C-reactive protein  5. Glaucoma suspect of  both eyes  H40.003   6. Combined forms of age-related cataract of both eyes  H25.813     1. Transient Vision Loss OS  - pt reports episodes of "graying out" of vision described as curtain coming from below -- initial onset 3-4 mos ago; variable duration and intensity/degree of vision loss; duration on  the order of minutes; frequency is multiple per week, with increased frequency of episodes over the last week.  - dilated eye exam fairly benign -- mild hypertensive changes in vasculature; no hemorrhage  - FA (12.01.20) showed mildly delayed filling time OS -- otherwise normal study OU without leakage or vascular perfusion defects  - concern for vascular etiology / transient retinal ischemia  - recommend limited lab work up to rule out GCA -- CBC/ESR/CRP -- labs ordered  - will discuss case with PCP -- recommend carotid ultrasound and echocardiogram; consider MRI/MRA vs CTA head and neck  - f/u in 6 wks for recheck, sooner prn  2. No retinal edema on exam or OCT  3,4. Hypertensive retinopathy OU  - discussed importance of tight BP control  - monitor  5. Glaucoma Suspect OU  - +cupping OU  - IOP 20, 19  - under the expert management of Dr. Parke Simmers  6. 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  - discussed diagnosis and progression  - monitor  Ophthalmic Meds Ordered this visit:  No orders of the defined types were placed in this encounter.      Return in about 6 weeks (around 10/17/2019).  There are no Patient Instructions on file for this visit.   Explained the diagnoses, plan, and follow up with the patient and they expressed understanding.  Patient expressed understanding of the importance of proper follow up care.   This document serves as a record of services personally performed by Gardiner Sleeper, MD, PhD. It was created on their behalf by Estill Bakes, COT an ophthalmic technician. The creation of this record is the provider's dictation and/or activities during the visit.    Electronically signed by: Estill Bakes, COT 09/04/19 @ 5:12 PM   This document serves as a record of services personally performed by Gardiner Sleeper, MD, PhD. It was created on their behalf by Ernest Mallick, OA, an ophthalmic assistant. The creation of this record is the provider's dictation and/or activities during the visit.    Electronically signed by: Ernest Mallick, OA 12.01.2020 5:12 PM  Gardiner Sleeper, M.D., Ph.D. Diseases & Surgery of the Retina and [place] 09/05/2019   I have reviewed the above documentation for accuracy and completeness, and I agree with the above. Gardiner Sleeper, M.D., Ph.D. 09/05/19 5:12 PM     Abbreviations: M myopia (nearsighted); A astigmatism; H hyperopia (farsighted); P presbyopia; Mrx spectacle prescription;  CTL contact lenses; OD right eye; OS left eye; OU both eyes  XT exotropia; ET esotropia; PEK punctate epithelial keratitis; PEE punctate epithelial erosions; DES dry eye syndrome; MGD meibomian gland dysfunction; ATs artificial tears; PFAT's preservative free artificial tears; [place] nuclear sclerotic cataract; PSC posterior subcapsular cataract; ERM epi-retinal membrane; PVD posterior vitreous detachment; RD retinal detachment; DM diabetes mellitus; DR diabetic retinopathy; NPDR non-proliferative diabetic retinopathy; PDR proliferative diabetic retinopathy; CSME clinically significant macular edema; DME diabetic macular edema; dbh dot blot hemorrhages; CWS cotton wool spot; POAG primary open angle glaucoma; C/D cup-to-disc ratio; HVF humphrey  visual field; GVF goldmann visual field; OCT optical coherence tomography; IOP intraocular pressure; BRVO Branch retinal vein occlusion; CRVO central retinal vein occlusion; CRAO central retinal artery occlusion; BRAO branch retinal artery occlusion; RT retinal tear; SB scleral buckle; PPV pars plana vitrectomy; VH Vitreous hemorrhage; PRP panretinal laser photocoagulation; IVK intravitreal kenalog; VMT vitreomacular traction; MH Macular hole;  NVD neovascularization of the disc; NVE neovascularization elsewhere; AREDS age related eye disease study; ARMD age related macular degeneration;  POAG primary open angle glaucoma; EBMD epithelial/anterior basement membrane dystrophy; ACIOL anterior chamber intraocular lens; IOL intraocular lens; PCIOL posterior chamber intraocular lens; Phaco/IOL phacoemulsification with intraocular lens placement; [place] photorefractive keratectomy; LASIK laser assisted in situ keratomileusis; HTN hypertension; DM diabetes mellitus; COPD chronic obstructive pulmonary disease

## 2019-09-05 ENCOUNTER — Other Ambulatory Visit: Payer: Self-pay

## 2019-09-05 ENCOUNTER — Encounter (INDEPENDENT_AMBULATORY_CARE_PROVIDER_SITE_OTHER): Payer: Self-pay | Admitting: Ophthalmology

## 2019-09-05 ENCOUNTER — Ambulatory Visit (INDEPENDENT_AMBULATORY_CARE_PROVIDER_SITE_OTHER): Payer: Medicare Other | Admitting: Ophthalmology

## 2019-09-05 VITALS — BP 126/70

## 2019-09-05 DIAGNOSIS — I10 Essential (primary) hypertension: Secondary | ICD-10-CM

## 2019-09-05 DIAGNOSIS — H40003 Preglaucoma, unspecified, bilateral: Secondary | ICD-10-CM

## 2019-09-05 DIAGNOSIS — H3581 Retinal edema: Secondary | ICD-10-CM

## 2019-09-05 DIAGNOSIS — H25813 Combined forms of age-related cataract, bilateral: Secondary | ICD-10-CM

## 2019-09-05 DIAGNOSIS — H539 Unspecified visual disturbance: Secondary | ICD-10-CM | POA: Diagnosis not present

## 2019-09-05 DIAGNOSIS — H35033 Hypertensive retinopathy, bilateral: Secondary | ICD-10-CM | POA: Diagnosis not present

## 2019-09-07 LAB — SEDIMENTATION RATE: Sed Rate: 63 mm/hr — ABNORMAL HIGH (ref 0–40)

## 2019-09-07 LAB — CBC
Hematocrit: 38.2 % (ref 34.0–46.6)
Hemoglobin: 11.6 g/dL (ref 11.1–15.9)
MCH: 20.2 pg — ABNORMAL LOW (ref 26.6–33.0)
MCHC: 30.4 g/dL — ABNORMAL LOW (ref 31.5–35.7)
MCV: 67 fL — ABNORMAL LOW (ref 79–97)
Platelets: 282 10*3/uL (ref 150–450)
RBC: 5.74 x10E6/uL — ABNORMAL HIGH (ref 3.77–5.28)
RDW: 17.2 % — ABNORMAL HIGH (ref 11.7–15.4)
WBC: 5.6 10*3/uL (ref 3.4–10.8)

## 2019-09-07 LAB — C-REACTIVE PROTEIN: CRP: 1 mg/L (ref 0–10)

## 2019-09-11 ENCOUNTER — Other Ambulatory Visit (HOSPITAL_COMMUNITY): Payer: Self-pay | Admitting: Family Medicine

## 2019-09-11 DIAGNOSIS — H547 Unspecified visual loss: Secondary | ICD-10-CM

## 2019-09-12 ENCOUNTER — Other Ambulatory Visit: Payer: Self-pay | Admitting: Family Medicine

## 2019-09-12 DIAGNOSIS — H547 Unspecified visual loss: Secondary | ICD-10-CM

## 2019-09-14 ENCOUNTER — Other Ambulatory Visit: Payer: Self-pay

## 2019-09-14 ENCOUNTER — Encounter (INDEPENDENT_AMBULATORY_CARE_PROVIDER_SITE_OTHER): Payer: Self-pay | Admitting: Bariatrics

## 2019-09-14 ENCOUNTER — Telehealth (INDEPENDENT_AMBULATORY_CARE_PROVIDER_SITE_OTHER): Payer: Medicare Other | Admitting: Bariatrics

## 2019-09-14 DIAGNOSIS — E7849 Other hyperlipidemia: Secondary | ICD-10-CM

## 2019-09-14 DIAGNOSIS — I10 Essential (primary) hypertension: Secondary | ICD-10-CM | POA: Diagnosis not present

## 2019-09-14 DIAGNOSIS — E559 Vitamin D deficiency, unspecified: Secondary | ICD-10-CM | POA: Diagnosis not present

## 2019-09-14 DIAGNOSIS — Z6841 Body Mass Index (BMI) 40.0 and over, adult: Secondary | ICD-10-CM

## 2019-09-14 MED ORDER — VITAMIN D (ERGOCALCIFEROL) 1.25 MG (50000 UNIT) PO CAPS: 50000.0000 [IU] | ORAL_CAPSULE | ORAL | 0 refills | Status: DC

## 2019-09-14 NOTE — Progress Notes (Signed)
Office: [phone]  /  Fax: [phone] TeleHealth Visit:  [name] has verbally consented to this TeleHealth visit today. The patient is located at home, the provider is located at the [facility] office. The participants in this visit include the listed provider and patient. The visit was conducted today via Webex.  HPI:  Chief Complaint: OBESITY Kristina [name] is here to discuss her progress with her obesity treatment plan. She is on the Category 3 plan and states she is following her eating plan approximately 75-80% of the time. She states she is walking the dog 20-30 minutes 7 times per week.  Kristina [name] has maintained her weight since her last visit. She has had a vision disturbance in the left eye and has seen the retinal specialist (testing scheduled).  Today's visit was #18 Starting weight: 222 lbs  Starting date: 12/15/2018  Hypertension Kristina [name] has a diagnosis of hypertension and is taking HCTZ and Cozaar. She reports her blood pressure to be 126/78 and states readings have been normal.  Hyperlipidemia Kristina [name] has a diagnosis of hyperlipidemia and is taking Pravachol. She denies myalgias.  Vitamin D deficiency Kristina [name] has a diagnosis of Vitamin D deficiency. A prescription for Vitamin D was sent on 08/24/2019, but she states the prescription was not received.  ASSESSMENT AND PLAN:  Vitamin D deficiency - Plan: Vitamin D, Ergocalciferol, (DRISDOL) 1.25 MG (50000 UT) CAPS capsule  Essential hypertension  Other hyperlipidemia  Class 3 severe obesity with serious comorbidity and body mass index (BMI) of 40.0 to 44.9 in adult, unspecified obesity type ([place])  PLAN:  Hypertension Kristina [name] is working on healthy weight loss and exercise to improve blood pressure control. She will continue her medications as prescribed. She is aware of the importance of good blood pressure control with vision issues noted above. We will watch for signs of  hypotension as she continues her lifestyle modifications.  Hyperlipidemia Intensive lifestyle modifications as the first line treatment for hyperlipidemia. We discussed many lifestyle modifications today and Byrd will continue her medications, work on diet, exercise and weight loss efforts.  Vitamin D Deficiency Kristina [name] was informed that low Vitamin D levels contributes to fatigue and are associated with obesity, breast, and colon cancer. She was given a prescription for Vit D @ 50,000 IU every week #4 with 0 refills and will follow-up for routine testing of Vitamin D, at least 2-3 times per year. She was informed of the risk of over-replacement of Vitamin D and agrees to not increase her dose unless she discusses this with [name] first. Lasheka agrees to follow-up with our clinic in 2-4 weeks.  Obesity Kristina [name] is currently in the action stage of change. As such, her goal is to continue with weight loss efforts. She has agreed to follow the Category 3 plan. Kristina [name] will work on meal planning and intentional eating. Kristina [name] has been instructed to continue walking the dog 20-30 minutes daily for weight loss and overall health benefits. We discussed the following Behavioral Modification Strategies today: increasing lean protein intake, decreasing simple carbohydrates, increasing vegetables, increase H20 intake, decrease eating out, no skipping meals, work on meal planning and easy cooking plans, and keeping healthy foods in the home.  Kristina [name] has agreed to follow-up with our clinic in 2-4 weeks. She was informed of the importance of frequent follow-up visits to maximize her success with intensive lifestyle modifications for her multiple health conditions.  ALLERGIES: Allergies  Allergen Reactions  . Penicillins Anaphylaxis and Itching    MEDICATIONS: Current Outpatient  Medications on File Prior to Visit  Medication Sig Dispense Refill  . Chlorpheniramine-Pseudoeph (ALLEREST  MAXIMUM STRENGTH PO) Take 1 tablet by mouth daily as needed.    . cholecalciferol (VITAMIN D3) 25 MCG (1000 UT) tablet Take 1,000 Units by mouth once a week.    . cyclobenzaprine (FLEXERIL) 10 MG tablet Take 10 mg by mouth 3 (three) times daily as needed for muscle spasms.    [name] GLUCOSAMINE-CHONDROITIN PO Take 1 tablet by mouth daily.    . hydrochlorothiazide (HYDRODIURIL) 25 MG tablet Take 25 mg by mouth daily. Take 1/2 tab qd    . losartan (COZAAR) 100 MG tablet Take 100 mg by mouth daily.    . montelukast (SINGULAIR) 10 MG tablet TAKE 1 TABLET(10 MG) BY MOUTH AT BEDTIME 30 tablet 0  . Multiple Vitamin (MULTIVITAMIN) capsule Take 1 capsule by mouth daily.    . Multiple Vitamins-Minerals (AIRBORNE GUMMIES PO) Take by mouth.    . nabumetone (RELAFEN) 500 MG tablet Take 500 mg by mouth as needed.    . pravastatin (PRAVACHOL) 10 MG tablet Take 10 mg by mouth daily.    [name] triamcinolone (NASACORT ALLERGY 24HR) 55 MCG/ACT AERO nasal inhaler Place 1 spray into the nose daily.     No current facility-administered medications on file prior to visit.    PAST MEDICAL HISTORY: Past Medical History:  Diagnosis Date  . Allergic rhinitis   . Anemia glaucoma  . Back pain   . DDD (degenerative disc disease), lumbar   . High blood pressure   . High cholesterol   . History of stomach ulcers   . Rheumatic fever   . Rheumatoid arteritis ([place])     PAST SURGICAL HISTORY: Past Surgical History:  Procedure Laterality Date  . APPENDECTOMY  1955  . CHOLECYSTECTOMY  1963  . HERNIA REPAIR  2007  . HERNIA REPAIR  2015  . REPLACEMENT TOTAL KNEE  2016  . TONSILLECTOMY      SOCIAL HISTORY: Social History   Tobacco Use  . Smoking status: Former Smoker    Packs/day: 1.50    Years: 30.00    Pack years: 45.00    Types: Cigarettes    Quit date: 2009    Years since quitting: 11.9  . Smokeless tobacco: Never Used  Substance Use Topics  . Alcohol use: Yes    Comment: social  . Drug use: Never     FAMILY HISTORY: Family History  Problem Relation Age of Onset  . Kidney disease Mother   . Hypertension Mother   . Glaucoma Mother   . Emphysema Father   . Asthma Father   . Hypertension Father   . Heart disease Father   . COPD Brother    ROS: Review of Systems  Musculoskeletal: Negative for myalgias.   PHYSICAL EXAM: There were no vitals taken for this visit. There is no height or weight on file to calculate BMI. Physical Exam Vitals reviewed.  Constitutional:      Appearance: Normal appearance. She is obese.  Cardiovascular:     Rate and Rhythm: Normal rate.     Pulses: Normal pulses.  Pulmonary:     Effort: Pulmonary effort is normal.     Breath sounds: Normal breath sounds.  Musculoskeletal:        General: Normal range of motion.  Skin:    General: Skin is warm and dry.  Neurological:     Mental Status: She is alert and oriented to person, place, and time.  Psychiatric:  Behavior: Behavior normal.   RECENT LABS AND TESTS: BMET    Component Value Date/Time   NA 140 12/15/2018 1210   K 4.6 12/15/2018 1210   CL 99 12/15/2018 1210   CO2 25 12/15/2018 1210   GLUCOSE 94 12/15/2018 1210   BUN 7 (L) 12/15/2018 1210   CREATININE 0.63 12/15/2018 1210   CALCIUM 9.5 12/15/2018 1210   GFRNONAA 90 12/15/2018 1210   GFRAA 104 12/15/2018 1210   Lab Results  Component Value Date   HGBA1C 5.5 12/15/2018   Lab Results  Component Value Date   INSULIN 7.1 12/15/2018   CBC    Component Value Date/Time   WBC 5.6 09/06/2019 1040   RBC 5.74 (H) 09/06/2019 1040   HGB 11.6 09/06/2019 1040   HCT 38.2 09/06/2019 1040   PLT 282 09/06/2019 1040   MCV 67 (L) 09/06/2019 1040   MCH 20.2 (L) 09/06/2019 1040   MCHC 30.4 (L) 09/06/2019 1040   RDW 17.2 (H) 09/06/2019 1040   LYMPHSABS 2.2 04/01/2018 0857   EOSABS 0.3 04/01/2018 0857   BASOSABS 0.0 04/01/2018 0857   Iron/TIBC/Ferritin/ %Sat No results found for: IRON, TIBC, FERRITIN, IRONPCTSAT Lipid Panel      Component Value Date/Time   CHOL 223 (H) 12/15/2018 1210   TRIG 170 (H) 12/15/2018 1210   HDL 60 12/15/2018 1210   LDLCALC 129 (H) 12/15/2018 1210   Hepatic Function Panel     Component Value Date/Time   PROT 6.9 12/15/2018 1210   ALBUMIN 4.3 12/15/2018 1210   AST 24 12/15/2018 1210   ALT 24 12/15/2018 1210   ALKPHOS 70 12/15/2018 1210   BILITOT 0.5 12/15/2018 1210   No results found for: TSH   OBESITY BEHAVIORAL INTERVENTION VISIT DOCUMENTATION FOR INSURANCE (~15 minutes)  I, Michaelene Song, am acting as Location manager for [employer], DO  I have reviewed the above documentation for accuracy and completeness, and I agree with the above. Jearld Lesch, DO

## 2019-09-21 ENCOUNTER — Other Ambulatory Visit (HOSPITAL_COMMUNITY): Payer: Self-pay | Admitting: Family Medicine

## 2019-09-21 DIAGNOSIS — H547 Unspecified visual loss: Secondary | ICD-10-CM

## 2019-09-22 ENCOUNTER — Ambulatory Visit (HOSPITAL_COMMUNITY)
Admission: RE | Admit: 2019-09-22 | Discharge: 2019-09-22 | Disposition: A | Discharge status: Home / Self Care | Payer: Medicare Other | Source: Ambulatory Visit | Attending: Cardiovascular Disease | Admitting: Cardiovascular Disease

## 2019-09-22 ENCOUNTER — Ambulatory Visit: Payer: Medicare Other | Admitting: Cardiovascular Disease

## 2019-09-22 ENCOUNTER — Encounter: Payer: Self-pay | Admitting: Cardiovascular Disease

## 2019-09-22 ENCOUNTER — Ambulatory Visit (HOSPITAL_BASED_OUTPATIENT_CLINIC_OR_DEPARTMENT_OTHER): Payer: Medicare Other

## 2019-09-22 ENCOUNTER — Other Ambulatory Visit: Payer: Self-pay

## 2019-09-22 DIAGNOSIS — H547 Unspecified visual loss: Secondary | ICD-10-CM

## 2019-09-22 DIAGNOSIS — E785 Hyperlipidemia, unspecified: Secondary | ICD-10-CM | POA: Diagnosis not present

## 2019-09-22 DIAGNOSIS — I6523 Occlusion and stenosis of bilateral carotid arteries: Secondary | ICD-10-CM

## 2019-09-22 DIAGNOSIS — E782 Mixed hyperlipidemia: Secondary | ICD-10-CM | POA: Diagnosis not present

## 2019-09-22 DIAGNOSIS — I083 Combined rheumatic disorders of mitral, aortic and tricuspid valves: Secondary | ICD-10-CM

## 2019-09-22 DIAGNOSIS — I10 Essential (primary) hypertension: Secondary | ICD-10-CM | POA: Diagnosis not present

## 2019-09-22 DIAGNOSIS — Z87891 Personal history of nicotine dependence: Secondary | ICD-10-CM | POA: Diagnosis not present

## 2019-09-22 DIAGNOSIS — I119 Hypertensive heart disease without heart failure: Secondary | ICD-10-CM | POA: Insufficient documentation

## 2019-09-22 DIAGNOSIS — I779 Disorder of arteries and arterioles, unspecified: Secondary | ICD-10-CM | POA: Insufficient documentation

## 2019-09-22 MED ORDER — PRAVASTATIN SODIUM 40 MG PO TABS: 40.0000 mg | ORAL_TABLET | Freq: Every evening | ORAL | 3 refills | Status: DC

## 2019-09-22 NOTE — Assessment & Plan Note (Signed)
Carotid Dopplers performed today showed moderate right and high-grade left ICA stenosis.  She has been experiencing amaurosis fugax.  I have started started her on baby aspirin and will have her see a vascular surgeon early next week.  She will need carotid revascularization.

## 2019-09-22 NOTE — Patient Instructions (Addendum)
Medication Instructions:  INCREASE PRAVASTATIN TO 40 MG DAILY START ASPIRIN 81 MG DAILY  *If you need a refill on your cardiac medications before your next appointment, please call your pharmacy*  Lab Work: Your physician recommends that you return for lab work in: 2 [place] MIDNIGHT (WATER [place] TO [place]) LIVER FUNCTION TEST  If you have labs (blood work) drawn today and your tests are completely normal, you will receive your results only by: [name] MyChart Message (if you have MyChart) OR . A paper copy in the mail If you have any lab test that is abnormal or we need to change your treatment, we will call you to review the results.  Testing/Procedures: NONE ordered at this time of appointment   Follow-Up: At [facility], you and your health needs are our priority.  As part of our continuing mission to provide you with exceptional heart care, we have created designated Provider Care Teams.  These Care Teams include your primary Cardiologist (physician) and Advanced Practice Providers (APPs -  Physician Assistants and Nurse Practitioners) who all work together to provide you with the care you need, when you need it.  Your next appointment:   3 month(s)  The format for your next appointment:   In Person  Provider:   Quay Burow, MD  Other Instructions  You have been referred to Vein and Vascular

## 2019-09-22 NOTE — Assessment & Plan Note (Signed)
History of essential hypertension with blood pressure measured today at 116/72.  She is on hydrochlorothiazide and losartan.

## 2019-09-22 NOTE — Assessment & Plan Note (Signed)
History of hyperlipidemia on statin therapy with recent lipid profile performed 09/18/2019 revealing total cholesterol 180, LDL 101 and HDL 60.  Based on this, I am going to increase her pravastatin from 10 mg to 40 mg a day and we will recheck.

## 2019-09-22 NOTE — Progress Notes (Signed)
09/22/2019 Kristina [name]   1945/10/29  [id number]  Primary Physician Kathyrn Lass, MD Primary Cardiologist: Lorretta Harp MD [name], [place]  HPI:  Kristina [name] is a 73 y.o. moderately overweight single Caucasian female mother of 1 child, grandmother of 1 grandchild referred to me by Dr. Kathyrn Lass for symptomatic carotid artery disease.  She is a retired Engineer, materials in [employer]  She moved from [place] to [place] 2 years ago to be closer to family.  She worked at [employer] for 50 years.  Risk factors include remote tobacco abuse having quit 20 years ago, treated hypertension hyperlipidemia.  There is no family history for heart disease.  She is never had a heart attack or stroke.  She denies chest pain or shortness of breath.  She did lose 166 pounds 12 years ago and has been struggling with weight but is on a diet.  She began having amaurosis within the last several months and had the worst episode on Thanksgiving.  Her last episode was 2 days ago.  She has seen a retinal specialist who did not find any retinal abnormalities apparently.  She had a 2D echocardiogram performed today that showed normal LV systolic function without valvular abnormalities with grade 1 diastolic dysfunction and mild aortic stenosis.  Carotid Dopplers revealed moderate right and high-grade left ICA stenosis.   Current Meds  Medication Sig  . Chlorpheniramine-Pseudoeph (ALLEREST MAXIMUM STRENGTH PO) Take 1 tablet by mouth daily as needed.  . cholecalciferol (VITAMIN D3) 25 MCG (1000 UT) tablet Take 1,000 Units by mouth once a week.  . cyclobenzaprine (FLEXERIL) 10 MG tablet Take 10 mg by mouth 3 (three) times daily as needed for muscle spasms.  [name] GLUCOSAMINE-CHONDROITIN PO Take 1 tablet by mouth daily.  . hydrochlorothiazide (HYDRODIURIL) 25 MG tablet Take 25 mg by mouth daily. Take 1/2 tab qd  . losartan (COZAAR) 100 MG tablet Take 100 mg by mouth daily.  . montelukast  (SINGULAIR) 10 MG tablet TAKE 1 TABLET(10 MG) BY MOUTH AT BEDTIME  . Multiple Vitamin (MULTIVITAMIN) capsule Take 1 capsule by mouth daily.  . Multiple Vitamins-Minerals (AIRBORNE GUMMIES PO) Take by mouth.  . nabumetone (RELAFEN) 500 MG tablet Take 500 mg by mouth as needed.  . pravastatin (PRAVACHOL) 10 MG tablet Take 40 mg by mouth daily.  [name] triamcinolone (NASACORT ALLERGY 24HR) 55 MCG/ACT AERO nasal inhaler Place 1 spray into the nose daily.  . Vitamin D, Ergocalciferol, (DRISDOL) 1.25 MG (50000 UT) CAPS capsule Take 1 capsule (50,000 Units total) by mouth every 7 (seven) days.     Allergies  Allergen Reactions  . Penicillins Anaphylaxis and Itching    Social History   Socioeconomic History  . Marital status: Single    Spouse name: Not on file  . Number of children: Not on file  . Years of education: Not on file  . Highest education level: Not on file  Occupational History  . 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 Smoking status: Former Smoker    Packs/day: 1.50    Years: 30.00    Pack years: 45.00    Types: Cigarettes    Quit date: 2009    Years since quitting: 11.9  . Smokeless tobacco: Never Used  Substance and Sexual Activity  . Alcohol use: Yes    Comment: social  . Drug use: Never  . Sexual activity: Not on file  Other Topics Concern  .  Not on file  Social History Narrative  . Not on file   Social Determinants of Health   Financial Resource Strain:   . Difficulty of Paying Living Expenses: Not on file  Food Insecurity:   . Worried About Charity fundraiser in the Last Year: Not on file  . Ran Out of Food in the Last Year: Not on file  Transportation Needs:   . Lack of Transportation (Medical): Not on file  . Lack of Transportation (Non-Medical): Not on file  Physical Activity:   . Days of Exercise per Week: Not on file  . Minutes of Exercise per Session: Not on file  Stress:   . Feeling of Stress : Not on file    Social Connections:   . Frequency of Communication with Friends and Family: Not on file  . Frequency of Social Gatherings with Friends and Family: Not on file  . Attends Religious Services: Not on file  . Active Member of Clubs or Organizations: Not on file  . Attends Archivist Meetings: Not on file  . Marital Status: Not on file  Intimate Partner Violence:   . Fear of Current or Ex-Partner: Not on file  . Emotionally Abused: Not on file  . Physically Abused: Not on file  . Sexually Abused: Not on file     Review of Systems: General: negative for chills, fever, night sweats or weight changes.  Cardiovascular: negative for chest pain, dyspnea on exertion, edema, orthopnea, palpitations, paroxysmal nocturnal dyspnea or shortness of breath Dermatological: negative for rash Respiratory: negative for cough or wheezing Urologic: negative for hematuria Abdominal: negative for nausea, vomiting, diarrhea, bright red blood per rectum, melena, or hematemesis Neurologic: negative for visual changes, syncope, or dizziness All other systems reviewed and are otherwise negative except as noted above.    Blood pressure 116/72, pulse 64, temperature (!) 97.2 F (36.2 C), height 5\' 3"  (1.6 m), weight 189 lb (85.7 kg), SpO2 98 %.  General appearance: alert and no distress Neck: no adenopathy, no JVD, supple, symmetrical, trachea midline, thyroid not enlarged, symmetric, no tenderness/mass/nodules and Bilateral carotid bruits left louder than right Lungs: clear to auscultation bilaterally Heart: Soft outflow tract murmur consistent with aortic stenosis Extremities: extremities normal, atraumatic, no cyanosis or edema Pulses: 2+ and symmetric Skin: Skin color, texture, turgor normal. No rashes or lesions Neurologic: Alert and oriented X 3, normal strength and tone. Normal symmetric reflexes. Normal coordination and gait  EKG sinus rhythm at 64 with occasional PVC.  I personally reviewed  this EKG.  ASSESSMENT AND PLAN:   Hyperlipidemia History of hyperlipidemia on statin therapy with recent lipid profile performed 09/18/2019 revealing total cholesterol 180, LDL 101 and HDL 60.  Based on this, I am going to increase her pravastatin from 10 mg to 40 mg a day and we will recheck.  Essential hypertension History of essential hypertension with blood pressure measured today at 116/72.  She is on hydrochlorothiazide and losartan.  Carotid artery disease ([place]) Carotid Dopplers performed today showed moderate right and high-grade left ICA stenosis.  She has been experiencing amaurosis fugax.  I have started started her on baby aspirin and will have her see a vascular surgeon early next week.  She will need carotid revascularization.      Lorretta Harp MD FACP,FACC,FAHA, [facility] 09/22/2019 2:06 PM

## 2019-09-25 ENCOUNTER — Other Ambulatory Visit (HOSPITAL_COMMUNITY)
Admission: RE | Admit: 2019-09-25 | Discharge: 2019-09-25 | Disposition: A | Discharge status: Home / Self Care | Payer: Medicare Other | Source: Ambulatory Visit | Attending: Surgery | Admitting: Surgery

## 2019-09-25 ENCOUNTER — Ambulatory Visit (INDEPENDENT_AMBULATORY_CARE_PROVIDER_SITE_OTHER): Payer: Medicare Other | Admitting: Surgery

## 2019-09-25 ENCOUNTER — Other Ambulatory Visit: Payer: Self-pay

## 2019-09-25 ENCOUNTER — Encounter (HOSPITAL_COMMUNITY): Payer: Self-pay | Admitting: Surgery

## 2019-09-25 ENCOUNTER — Encounter: Payer: Self-pay | Admitting: Surgery

## 2019-09-25 ENCOUNTER — Encounter: Payer: Self-pay | Admitting: *Deleted

## 2019-09-25 ENCOUNTER — Other Ambulatory Visit: Payer: Self-pay | Admitting: *Deleted

## 2019-09-25 VITALS — BP 121/55 | HR 64 | Temp 97.7°F | Resp 20 | Ht 63.0 in | Wt 191.9 lb

## 2019-09-25 DIAGNOSIS — I6523 Occlusion and stenosis of bilateral carotid arteries: Secondary | ICD-10-CM

## 2019-09-25 LAB — SARS CORONAVIRUS 2 (TAT 6-24 HRS): SARS Coronavirus 2: NEGATIVE

## 2019-09-25 NOTE — Progress Notes (Signed)
[facility] [place]  Patient name: Kristina [name] MRN: [id number] DOB: April 23, 1946 Sex: female   REQUESTING PROVIDER:    Dr. Gwenlyn Found   REASON FOR CONSULT:    Carotid stensosi  HISTORY OF PRESENT ILLNESS:   Kristina [name] is a 73 y.o. female, who is referred for evaluation of symptomatic left carotid stenosis.  The patient states that beginning in the mid summer she began having episodes where she had a curtain covering up her eye from the bottom to the top.  Episodes would not last very long and then would spontaneously resolve.  She did see her ophthalmologist who did identify any abnormalities.  Her symptoms progressed.  Around Thanksgiving she had symptoms that were resulting in near total vision loss for 3 to 4 minutes.  She continues to have multiple episodes.  She had a recent carotid duplex which showed greater than 80% left carotid stenosis.  The patient recently had echocardiogram that showed normal LV function without valvular abnormalities.  She takes a statin for hypercholesterolemia, which was recently increased.  She has a remote history of tobacco abuse having quit 20 years ago.  She is medically managed for hypertension.  She has never had a heart attack.  PAST MEDICAL HISTORY    Past Medical History:  Diagnosis Date  . Allergic rhinitis   . Anemia glaucoma  . Back pain   . DDD (degenerative disc disease), lumbar   . High blood pressure   . High cholesterol   . History of stomach ulcers   . Rheumatic fever   . Rheumatoid arteritis ([place])      FAMILY HISTORY   Family History  Problem Relation Age of Onset  . Kidney disease Mother   . Hypertension Mother   . Glaucoma Mother   . Emphysema Father   . Asthma Father   . Hypertension Father   . Heart disease Father   . COPD Brother     SOCIAL HISTORY:   Social History   Socioeconomic History  . Marital status: Single    Spouse name: Not on  file  . Number of children: Not on file  . Years of education: Not on file  . Highest education level: Not on file  Occupational History  . 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 Smoking status: Former Smoker    Packs/day: 1.50    Years: 30.00    Pack years: 45.00    Types: Cigarettes    Quit date: 2009    Years since quitting: 11.9  . Smokeless tobacco: Never Used  Substance and Sexual Activity  . Alcohol use: Yes    Comment: social  . Drug use: Never  . Sexual activity: Not on file  Other Topics Concern  . Not on file  Social History Narrative  . Not on file   Social Determinants of Health   Financial Resource Strain:   . Difficulty of Paying Living Expenses: Not on file  Food Insecurity:   . Worried About Charity fundraiser in the Last Year: Not on file  . Ran Out of Food in the Last Year: Not on file  Transportation Needs:   . Lack of Transportation (Medical): Not on file  . Lack of Transportation (Non-Medical): Not on file  Physical Activity:   . Days of Exercise per Week: Not on file  . Minutes of Exercise per Session: Not on file  Stress:   . Feeling of  Stress : Not on file  Social Connections:   . Frequency of Communication with Friends and Family: Not on file  . Frequency of Social Gatherings with Friends and Family: Not on file  . Attends Religious Services: Not on file  . Active Member of Clubs or Organizations: Not on file  . Attends Archivist Meetings: Not on file  . Marital Status: Not on file  Intimate Partner Violence:   . Fear of Current or Ex-Partner: Not on file  . Emotionally Abused: Not on file  . Physically Abused: Not on file  . Sexually Abused: Not on file    ALLERGIES:    Allergies  Allergen Reactions  . Penicillins Anaphylaxis and Itching    CURRENT MEDICATIONS:    Current Outpatient Medications  Medication Sig Dispense Refill  . Chlorpheniramine-Pseudoeph (ALLEREST MAXIMUM  STRENGTH PO) Take 1 tablet by mouth daily as needed.    . cholecalciferol (VITAMIN D3) 25 MCG (1000 UT) tablet Take 1,000 Units by mouth once a week.    . cyclobenzaprine (FLEXERIL) 10 MG tablet Take 10 mg by mouth 3 (three) times daily as needed for muscle spasms.    [name] GLUCOSAMINE-CHONDROITIN PO Take 1 tablet by mouth daily.    . hydrochlorothiazide (HYDRODIURIL) 25 MG tablet Take 25 mg by mouth daily. Take 1/2 tab qd    . losartan (COZAAR) 100 MG tablet Take 100 mg by mouth daily.    . montelukast (SINGULAIR) 10 MG tablet TAKE 1 TABLET(10 MG) BY MOUTH AT BEDTIME 30 tablet 0  . Multiple Vitamin (MULTIVITAMIN) capsule Take 1 capsule by mouth daily.    . Multiple Vitamins-Minerals (AIRBORNE GUMMIES PO) Take by mouth.    . nabumetone (RELAFEN) 500 MG tablet Take 500 mg by mouth as needed.    . pravastatin (PRAVACHOL) 40 MG tablet Take 1 tablet (40 mg total) by mouth every evening. 90 tablet 3  . triamcinolone (NASACORT ALLERGY 24HR) 55 MCG/ACT AERO nasal inhaler Place 1 spray into the nose daily.    . Vitamin D, Ergocalciferol, (DRISDOL) 1.25 MG (50000 UT) CAPS capsule Take 1 capsule (50,000 Units total) by mouth every 7 (seven) days. 4 capsule 0   No current facility-administered medications for this visit.    REVIEW OF SYSTEMS:   [X]  denotes positive finding, [ ]  denotes negative finding Cardiac  Comments:  Chest pain or chest pressure:    Shortness of breath upon exertion:    Short of breath when lying flat:    Irregular heart rhythm:        Vascular    Pain in calf, thigh, or hip brought on by ambulation:    Pain in feet at night that wakes you up from your sleep:     Blood clot in your veins:    Leg swelling:         Pulmonary    Oxygen at home:    Productive cough:     Wheezing:         Neurologic    Sudden weakness in arms or legs:     Sudden numbness in arms or legs:     Sudden onset of difficulty speaking or slurred speech:    Temporary loss of vision in one eye:  x     Problems with dizziness:         Gastrointestinal    Blood in stool:      Vomited blood:         Genitourinary    Burning  when urinating:     Blood in urine:        Psychiatric    Major depression:         Hematologic    Bleeding problems:    Problems with blood clotting too easily:        Skin    Rashes or ulcers:        Constitutional    Fever or chills:     PHYSICAL EXAM:   Vitals:   09/25/19 0936 09/25/19 0939  BP: 128/60 (!) 121/55  Pulse: 64   Resp: 20   Temp: 97.7 F (36.5 C)   SpO2: 97%   Weight: 191 lb 14.4 oz (87 kg)   Height: 5\' 3"  (1.6 m)     GENERAL: The patient is a well-nourished female, in no acute distress. The vital signs are documented above. CARDIAC: There is a regular rate and rhythm.  VASCULAR: Ultrasound was used at the bedside to evaluate the carotid bifurcation which is at the mid neck at the level of the hyoid.  I was able to easily visualize normal artery above the plaque. PULMONARY: Nonlabored respirations ABDOMEN: Soft and non-tender with normal pitched bowel sounds.  MUSCULOSKELETAL: There are no major deformities or cyanosis. NEUROLOGIC: No focal weakness or paresthesias are detected. SKIN: There are no ulcers or rashes noted. PSYCHIATRIC: The patient has a normal affect.  STUDIES:   I have reviewed the following carotid duplex Right Carotid: Velocities in the right ICA are consistent with a 40-59%                stenosis. Non-hemodynamically significant plaque <50% noted in                the CCA. Stenosis is based on peak systolic velocities and plaque                formation.  Left Carotid: Velocities in the left ICA are consistent with a 80-99% stenosis.               Non-hemodynamically significant plaque <50% noted in the CCA.  Vertebrals:  Bilateral vertebral arteries demonstrate antegrade flow. Subclavians: Right subclavian artery was stenotic. Normal flow hemodynamics were              seen in the left  subclavian artery.  ASSESSMENT and PLAN   Symptomatic left carotid stenosis: We discussed treatment options including stenting versus endarterectomy.  I feel that she is best suited for endarterectomy given the significant calcification.  I discussed the risks and benefits including but not limited to the risk of stroke, nerve injury, coronary issues, bleeding, and infection.  All of her questions were answered.  She is being scheduled for Wednesday, December 23.  She will continue her aspirin and statin.   Leia Alf, MD, FACS [facility] [phone] Pager [phone]

## 2019-09-25 NOTE — H&P (View-Only) (Signed)
[facility] of [place]  Patient name: Kristina [name] MRN: [id number] DOB: [birth year] Sex: female   REQUESTING PROVIDER:    Dr. Gwenlyn Found   REASON FOR CONSULT:    Carotid stensosi  HISTORY OF PRESENT ILLNESS:   Kristina [name] is a 73 y.o. female, who is referred for evaluation of symptomatic left carotid stenosis.  The patient states that beginning in the mid summer she began having episodes where she had a curtain covering up her eye from the bottom to the top.  Episodes would not last very long and then would spontaneously resolve.  She did see her ophthalmologist who did identify any abnormalities.  Her symptoms progressed.  Around Thanksgiving she had symptoms that were resulting in near total vision loss for 3 to 4 minutes.  She continues to have multiple episodes.  She had a recent carotid duplex which showed greater than 80% left carotid stenosis.  The patient recently had echocardiogram that showed normal LV function without valvular abnormalities.  She takes a statin for hypercholesterolemia, which was recently increased.  She has a remote history of tobacco abuse having quit 20 years ago.  She is medically managed for hypertension.  She has never had a heart attack.  PAST MEDICAL HISTORY    Past Medical History:  Diagnosis Date  . Allergic rhinitis   . Anemia glaucoma  . Back pain   . DDD (degenerative disc disease), lumbar   . High blood pressure   . High cholesterol   . History of stomach ulcers   . Rheumatic fever   . Rheumatoid arteritis ([place])      FAMILY HISTORY   Family History  Problem Relation Age of Onset  . Kidney disease Mother   . Hypertension Mother   . Glaucoma Mother   . Emphysema Father   . Asthma Father   . Hypertension Father   . Heart disease Father   . COPD Brother     SOCIAL HISTORY:   Social History   Socioeconomic History  . Marital status: Single    Spouse name: Not on  file  . Number of children: Not on file  . Years of education: Not on file  . Highest education level: Not on file  Occupational History  . 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 Smoking status: Former Smoker    Packs/day: 1.50    Years: 30.00    Pack years: 45.00    Types: Cigarettes    Quit date: 2009    Years since quitting: 11.9  . Smokeless tobacco: Never Used  Substance and Sexual Activity  . Alcohol use: Yes    Comment: social  . Drug use: Never  . Sexual activity: Not on file  Other Topics Concern  . Not on file  Social History Narrative  . Not on file   Social Determinants of Health   Financial Resource Strain:   . Difficulty of Paying Living Expenses: Not on file  Food Insecurity:   . Worried About Charity fundraiser in the Last Year: Not on file  . Ran Out of Food in the Last Year: Not on file  Transportation Needs:   . Lack of Transportation (Medical): Not on file  . Lack of Transportation (Non-Medical): Not on file  Physical Activity:   . Days of Exercise per Week: Not on file  . Minutes of Exercise per Session: Not on file  Stress:   . Feeling of  Stress : Not on file  Social Connections:   . Frequency of Communication with Friends and Family: Not on file  . Frequency of Social Gatherings with Friends and Family: Not on file  . Attends Religious Services: Not on file  . Active Member of Clubs or Organizations: Not on file  . Attends Archivist Meetings: Not on file  . Marital Status: Not on file  Intimate Partner Violence:   . Fear of Current or Ex-Partner: Not on file  . Emotionally Abused: Not on file  . Physically Abused: Not on file  . Sexually Abused: Not on file    ALLERGIES:    Allergies  Allergen Reactions  . Penicillins Anaphylaxis and Itching    CURRENT MEDICATIONS:    Current Outpatient Medications  Medication Sig Dispense Refill  . Chlorpheniramine-Pseudoeph (ALLEREST MAXIMUM  STRENGTH PO) Take 1 tablet by mouth daily as needed.    . cholecalciferol (VITAMIN D3) 25 MCG (1000 UT) tablet Take 1,000 Units by mouth once a week.    . cyclobenzaprine (FLEXERIL) 10 MG tablet Take 10 mg by mouth 3 (three) times daily as needed for muscle spasms.    [name] GLUCOSAMINE-CHONDROITIN PO Take 1 tablet by mouth daily.    . hydrochlorothiazide (HYDRODIURIL) 25 MG tablet Take 25 mg by mouth daily. Take 1/2 tab qd    . losartan (COZAAR) 100 MG tablet Take 100 mg by mouth daily.    . montelukast (SINGULAIR) 10 MG tablet TAKE 1 TABLET(10 MG) BY MOUTH AT BEDTIME 30 tablet 0  . Multiple Vitamin (MULTIVITAMIN) capsule Take 1 capsule by mouth daily.    . Multiple Vitamins-Minerals (AIRBORNE GUMMIES PO) Take by mouth.    . nabumetone (RELAFEN) 500 MG tablet Take 500 mg by mouth as needed.    . pravastatin (PRAVACHOL) 40 MG tablet Take 1 tablet (40 mg total) by mouth every evening. 90 tablet 3  . triamcinolone (NASACORT ALLERGY 24HR) 55 MCG/ACT AERO nasal inhaler Place 1 spray into the nose daily.    . Vitamin D, Ergocalciferol, (DRISDOL) 1.25 MG (50000 UT) CAPS capsule Take 1 capsule (50,000 Units total) by mouth every 7 (seven) days. 4 capsule 0   No current facility-administered medications for this visit.    REVIEW OF SYSTEMS:   [X]  denotes positive finding, [ ]  denotes negative finding Cardiac  Comments:  Chest pain or chest pressure:    Shortness of breath upon exertion:    Short of breath when lying flat:    Irregular heart rhythm:        Vascular    Pain in calf, thigh, or hip brought on by ambulation:    Pain in feet at night that wakes you up from your sleep:     Blood clot in your veins:    Leg swelling:         Pulmonary    Oxygen at home:    Productive cough:     Wheezing:         Neurologic    Sudden weakness in arms or legs:     Sudden numbness in arms or legs:     Sudden onset of difficulty speaking or slurred speech:    Temporary loss of vision in one eye:  x    Problems with dizziness:         Gastrointestinal    Blood in stool:      Vomited blood:         Genitourinary    Burning when  urinating:     Blood in urine:        Psychiatric    Major depression:         Hematologic    Bleeding problems:    Problems with blood clotting too easily:        Skin    Rashes or ulcers:        Constitutional    Fever or chills:     PHYSICAL EXAM:   Vitals:   09/25/19 0936 09/25/19 0939  BP: 128/60 (!) 121/55  Pulse: 64   Resp: 20   Temp: 97.7 F (36.5 C)   SpO2: 97%   Weight: 191 lb 14.4 oz (87 kg)   Height: 5\' 3"  (1.6 m)     GENERAL: The patient is a well-nourished female, in no acute distress. The vital signs are documented above. CARDIAC: There is a regular rate and rhythm.  VASCULAR: Ultrasound was used at the bedside to evaluate the carotid bifurcation which is at the mid neck at the level of the hyoid.  I was able to easily visualize normal artery above the plaque. PULMONARY: Nonlabored respirations ABDOMEN: Soft and non-tender with normal pitched bowel sounds.  MUSCULOSKELETAL: There are no major deformities or cyanosis. NEUROLOGIC: No focal weakness or paresthesias are detected. SKIN: There are no ulcers or rashes noted. PSYCHIATRIC: The patient has a normal affect.  STUDIES:   I have reviewed the following carotid duplex Right Carotid: Velocities in the right ICA are consistent with a 40-59%                stenosis. Non-hemodynamically significant plaque <50% noted in                the CCA. Stenosis is based on peak systolic velocities and plaque                formation.  Left Carotid: Velocities in the left ICA are consistent with a 80-99% stenosis.               Non-hemodynamically significant plaque <50% noted in the CCA.  Vertebrals:  Bilateral vertebral arteries demonstrate antegrade flow. Subclavians: Right subclavian artery was stenotic. Normal flow hemodynamics were              seen in the left subclavian  artery.  ASSESSMENT and PLAN   Symptomatic left carotid stenosis: We discussed treatment options including stenting versus endarterectomy.  I feel that she is best suited for endarterectomy given the significant calcification.  I discussed the risks and benefits including but not limited to the risk of stroke, nerve injury, coronary issues, bleeding, and infection.  All of her questions were answered.  She is being scheduled for Wednesday, December 23.  She will continue her aspirin and statin.   Leia Alf, MD, FACS [facility] [phone] Pager [phone]

## 2019-09-26 ENCOUNTER — Telehealth (INDEPENDENT_AMBULATORY_CARE_PROVIDER_SITE_OTHER): Payer: Medicare Other | Admitting: Bariatrics

## 2019-09-26 NOTE — Anesthesia Preprocedure Evaluation (Addendum)
Anesthesia Evaluation  Patient identified by MRN, date of birth, ID band Patient awake    Reviewed: Allergy & Precautions, NPO status , Patient's Chart, lab work & pertinent test results  Airway Mallampati: II  TM Distance: >3 FB     Dental  (+) Dental Advisory Given, Upper Dentures, Partial Lower   Pulmonary former smoker,    breath sounds clear to auscultation       Cardiovascular hypertension, Pt. on medications  Rhythm:Regular Rate:Normal  Normal EF. Mild AS   Neuro/Psych negative neurological ROS     GI/Hepatic negative GI ROS, Neg liver ROS,   Endo/Other  negative endocrine ROS  Renal/GU negative Renal ROS     Musculoskeletal  (+) Arthritis ,   Abdominal   Peds  Hematology negative hematology ROS (+)   Anesthesia Other Findings   Reproductive/Obstetrics                             Lab Results  Component Value Date   WBC 5.6 09/06/2019   HGB 11.6 09/06/2019   HCT 38.2 09/06/2019   MCV 67 (L) 09/06/2019   PLT 282 09/06/2019   Lab Results  Component Value Date   CREATININE 0.63 12/15/2018   BUN 7 (L) 12/15/2018   NA 140 12/15/2018   K 4.6 12/15/2018   CL 99 12/15/2018   CO2 25 12/15/2018    Anesthesia Physical Anesthesia Plan  ASA: III  Anesthesia Plan: General   Post-op Pain Management:    Induction: Intravenous  PONV Risk Score and Plan: 3 and Dexamethasone, Ondansetron and Treatment may vary due to age or medical condition  Airway Management Planned: Oral ETT  Additional Equipment: Arterial line  Intra-op Plan:   Post-operative Plan: Extubation in OR  Informed Consent: I have reviewed the patients History and Physical, chart, labs and discussed the procedure including the risks, benefits and alternatives for the proposed anesthesia with the patient or authorized representative who has indicated his/her understanding and acceptance.     Dental advisory  given  Plan Discussed with: CRNA  Anesthesia Plan Comments: ( )       Anesthesia Quick Evaluation

## 2019-09-26 NOTE — Progress Notes (Signed)
Anesthesia Chart Review: Kristina [name]    Case: [id number] Date/Time: 09/27/19 1005   Procedure: ENDARTERECTOMY CAROTID LEFT (Left )   Anesthesia type: General   Pre-op diagnosis: LEFT CAROTID ARTHERY STENOSIS   Location: [facility] OR ROOM 16 / [place] OR   Surgeons: Serafina Mitchell, MD      DISCUSSION: Patient is a 73 year old female scheduled for the above procedure. She was referred by cardiologist Dr. Gwenlyn Found for symptomatic LICA stenosis.  History in [place] lists: former smoker (quit 2009), HTN, hypercholesterolemia, RA, rheumatic fever, anemia, glaucoma. 09/22/19 echo also showed finding of mild aortic stenosis. BMI is consistent with obesity.   09/25/19 COVID-19 test negative. She is a same day work-up, so she will get labs and anesthesia team evaluation on the day of surgery.    VS: As of 09/25/19: BP: 128/60  Pulse: 64  Resp: 20  Temp: 97.7 F (36.5 C)  SpO2: 97%  Weight: 191 lb 14.4 oz (87 kg)  Height: 5\' 3"  (1.6 m)     PROVIDERS: Kathyrn Lass, MD is PCP  Quay Burow, MD is cardiologist. First evaluated on 10/02/19 for further evaluation of amaurosis fugax and found to have moderate right and high grade left ICA stenosis. She was referred to vascular surgery for carotid revascularization.   LABS: She is for labs on arrival. As of 12/15/18, Cr 0.63 and H/H 11.6/38.2 on 09/06/19.   Spirometry 03/30/18: FVC 2.72 (105%), FEV1 2.07 (103#), FEV1R 0.76 (100%), FEF25-75 1.15 (66%.).    EKG: 09/22/19: Sinus rhythm with occasional PVCs.   CV: Carotid US 09/12/19: Summary: - Right Carotid: Velocities in the right ICA are consistent with a 40-59%                stenosis. Non-hemodynamically significant plaque <50% noted in                the CCA. Stenosis is based on peak systolic velocities and plaque                formation. - Left Carotid: Velocities in the left ICA are consistent with a 80-99% stenosis.               Non-hemodynamically significant plaque <50% noted in  the CCA. - Vertebrals:  Bilateral vertebral arteries demonstrate antegrade flow. - Subclavians: Right subclavian artery was stenotic. Normal flow hemodynamics were              seen in the left subclavian artery.   Echo 09/22/19: IMPRESSIONS  1. Left ventricular ejection fraction, by visual estimation, is 60 to 65%. The left ventricle has normal function. There is no left ventricular hypertrophy.  2. Elevated left atrial pressure.  3. Left ventricular diastolic parameters are consistent with Grade I diastolic dysfunction (impaired relaxation).  4. The left ventricle has no regional wall motion abnormalities.  5. Global right ventricle has normal systolic function.The right ventricular size is normal.  6. Left atrial size was moderately dilated.  7. Right atrial size was normal.  8. Mild mitral annular calcification.  9. The mitral valve is normal in structure. Mild mitral valve regurgitation. No evidence of mitral stenosis. 10. The tricuspid valve is normal in structure. Tricuspid valve regurgitation is mild. 11. The aortic valve is tricuspid. Aortic valve regurgitation is not visualized. Mild aortic valve stenosis. 12. The pulmonic valve was normal in structure. Pulmonic valve regurgitation is not visualized. 13. The inferior vena cava is normal in size with greater than 50% respiratory variability, suggesting  right atrial pressure of 3 mmHg. 14. Normal LV systolic function; grade 1 diastolic dysfunction; calcified aortic valve with mild AS (mean gradient 12 mmHg); mild MR; moderate LAE. 15. The interatrial septum is aneurysmal.   Past Medical History:  Diagnosis Date  . Allergic rhinitis   . Anemia glaucoma  . Anxiety   . Back pain   . DDD (degenerative disc disease), lumbar   . High blood pressure   . High cholesterol   . History of stomach ulcers   . Rheumatic fever   . Rheumatoid arteritis ([place])     Past Surgical History:  Procedure Laterality Date  . APPENDECTOMY   1955  . CHOLECYSTECTOMY  1963  . HERNIA REPAIR  2007  . HERNIA REPAIR  2015  . REPLACEMENT TOTAL KNEE  2016  . TONSILLECTOMY      MEDICATIONS: No current facility-administered medications for this encounter.   [name] aspirin EC 81 MG tablet  . ELDERBERRY PO  . escitalopram (LEXAPRO) 10 MG tablet  . GLUCOSAMINE-CHONDROITIN PO  . hydrochlorothiazide (HYDRODIURIL) 25 MG tablet  . ibuprofen (ADVIL) 200 MG tablet  . losartan (COZAAR) 100 MG tablet  . montelukast (SINGULAIR) 10 MG tablet  . Multiple Vitamin (MULTIVITAMIN) capsule  . Multiple Vitamins-Minerals (AIRBORNE GUMMIES PO)  . pravastatin (PRAVACHOL) 40 MG tablet  . Vitamin D, Ergocalciferol, (DRISDOL) 1.25 MG (50000 UT) CAPS capsule     Myra Gianotti, PA-C Surgical Short Stay/Anesthesiology [facility] Phone [phone] [facility] Phone [phone] 09/26/2019 9:41 AM

## 2019-09-27 ENCOUNTER — Inpatient Hospital Stay (HOSPITAL_COMMUNITY)
Admission: RE | Admit: 2019-09-27 | Discharge: 2019-09-28 | DRG: 039 | Disposition: A | Discharge status: Home / Self Care | Payer: Medicare Other | Attending: Surgery | Admitting: Surgery

## 2019-09-27 ENCOUNTER — Encounter (HOSPITAL_COMMUNITY)
Admission: RE | Disposition: A | Discharge status: Home / Self Care | Payer: Self-pay | Source: Home / Self Care | Attending: Surgery

## 2019-09-27 ENCOUNTER — Inpatient Hospital Stay (HOSPITAL_COMMUNITY): Payer: Medicare Other | Admitting: Vascular Surgery

## 2019-09-27 ENCOUNTER — Other Ambulatory Visit: Payer: Self-pay

## 2019-09-27 ENCOUNTER — Encounter (HOSPITAL_COMMUNITY): Payer: Self-pay | Admitting: Surgery

## 2019-09-27 DIAGNOSIS — Z79899 Other long term (current) drug therapy: Secondary | ICD-10-CM

## 2019-09-27 DIAGNOSIS — Z20828 Contact with and (suspected) exposure to other viral communicable diseases: Secondary | ICD-10-CM | POA: Diagnosis present

## 2019-09-27 DIAGNOSIS — Z9049 Acquired absence of other specified parts of digestive tract: Secondary | ICD-10-CM

## 2019-09-27 DIAGNOSIS — I10 Essential (primary) hypertension: Secondary | ICD-10-CM | POA: Diagnosis present

## 2019-09-27 DIAGNOSIS — Z8249 Family history of ischemic heart disease and other diseases of the circulatory system: Secondary | ICD-10-CM | POA: Diagnosis not present

## 2019-09-27 DIAGNOSIS — Z83511 Family history of glaucoma: Secondary | ICD-10-CM | POA: Diagnosis not present

## 2019-09-27 DIAGNOSIS — M5136 Other intervertebral disc degeneration, lumbar region: Secondary | ICD-10-CM | POA: Diagnosis present

## 2019-09-27 DIAGNOSIS — Z8711 Personal history of peptic ulcer disease: Secondary | ICD-10-CM | POA: Diagnosis not present

## 2019-09-27 DIAGNOSIS — E7800 Pure hypercholesterolemia, unspecified: Secondary | ICD-10-CM | POA: Diagnosis present

## 2019-09-27 DIAGNOSIS — I6522 Occlusion and stenosis of left carotid artery: Principal | ICD-10-CM | POA: Diagnosis present

## 2019-09-27 DIAGNOSIS — Z841 Family history of disorders of kidney and ureter: Secondary | ICD-10-CM | POA: Diagnosis not present

## 2019-09-27 DIAGNOSIS — Z825 Family history of asthma and other chronic lower respiratory diseases: Secondary | ICD-10-CM | POA: Diagnosis not present

## 2019-09-27 DIAGNOSIS — Z87891 Personal history of nicotine dependence: Secondary | ICD-10-CM | POA: Diagnosis not present

## 2019-09-27 HISTORY — PX: ENDARTERECTOMY: SHX5162

## 2019-09-27 HISTORY — DX: Anxiety disorder, unspecified: F41.9

## 2019-09-27 HISTORY — PX: PATCH ANGIOPLASTY: SHX6230

## 2019-09-27 LAB — CBC
HCT: 39.1 % (ref 36.0–46.0)
Hemoglobin: 12 g/dL (ref 12.0–15.0)
MCH: 20.5 pg — ABNORMAL LOW (ref 26.0–34.0)
MCHC: 30.7 g/dL (ref 30.0–36.0)
MCV: 66.8 fL — ABNORMAL LOW (ref 80.0–100.0)
Platelets: 274 10*3/uL (ref 150–400)
RBC: 5.85 MIL/uL — ABNORMAL HIGH (ref 3.87–5.11)
RDW: 16.6 % — ABNORMAL HIGH (ref 11.5–15.5)
WBC: 6.5 10*3/uL (ref 4.0–10.5)
nRBC: 0 % (ref 0.0–0.2)

## 2019-09-27 LAB — COMPREHENSIVE METABOLIC PANEL WITH GFR
ALT: 23 U/L (ref 0–44)
AST: 23 U/L (ref 15–41)
Albumin: 3.9 g/dL (ref 3.5–5.0)
Alkaline Phosphatase: 52 U/L (ref 38–126)
Anion gap: 10 (ref 5–15)
BUN: 14 mg/dL (ref 8–23)
CO2: 27 mmol/L (ref 22–32)
Calcium: 9.3 mg/dL (ref 8.9–10.3)
Chloride: 104 mmol/L (ref 98–111)
Creatinine, Ser: 0.83 mg/dL (ref 0.44–1.00)
GFR calc Af Amer: >60 mL/min
GFR calc non Af Amer: >60 mL/min
Glucose, Bld: 97 mg/dL (ref 70–99)
Potassium: 4 mmol/L (ref 3.5–5.1)
Sodium: 141 mmol/L (ref 135–145)
Total Bilirubin: 1 mg/dL (ref 0.3–1.2)
Total Protein: 6.8 g/dL (ref 6.5–8.1)

## 2019-09-27 LAB — ABO/RH: ABO/RH(D): O POS

## 2019-09-27 LAB — URINALYSIS, ROUTINE W REFLEX MICROSCOPIC
Bilirubin Urine: NEGATIVE
Glucose, UA: NEGATIVE mg/dL
Hgb urine dipstick: NEGATIVE
Ketones, ur: NEGATIVE mg/dL
Leukocytes,Ua: NEGATIVE
Nitrite: NEGATIVE
Protein, ur: NEGATIVE mg/dL
Specific Gravity, Urine: 1.017 (ref 1.005–1.030)
pH: 6 (ref 5.0–8.0)

## 2019-09-27 LAB — PROTIME-INR
INR: 1 (ref 0.8–1.2)
Prothrombin Time: 13.2 seconds (ref 11.4–15.2)

## 2019-09-27 LAB — APTT: aPTT: 29 seconds (ref 24–36)

## 2019-09-27 LAB — TYPE AND SCREEN
ABO/RH(D): O POS
Antibody Screen: NEGATIVE

## 2019-09-27 LAB — POCT ACTIVATED CLOTTING TIME: Activated Clotting Time: 263 seconds

## 2019-09-27 SURGERY — ENDARTERECTOMY, CAROTID
Anesthesia: General | Site: Neck | Laterality: Left

## 2019-09-27 MED ORDER — DEXAMETHASONE SODIUM PHOSPHATE 10 MG/ML IJ SOLN
INTRAMUSCULAR | Status: DC | PRN
  Administered 2019-09-27: 10 mg via INTRAVENOUS

## 2019-09-27 MED ORDER — HYDRALAZINE HCL 20 MG/ML IJ SOLN: 5.0000 mg | INTRAMUSCULAR | Status: DC | PRN

## 2019-09-27 MED ORDER — SODIUM CHLORIDE 0.9 % IV SOLN
0.0125 ug/kg/min | INTRAVENOUS | Status: DC
  Filled 2019-09-27: qty 2000

## 2019-09-27 MED ORDER — ASPIRIN EC 81 MG PO TBEC
81.0000 mg | DELAYED_RELEASE_TABLET | Freq: Every day | ORAL | Status: DC
  Administered 2019-09-28: 06:00:00 81 mg via ORAL
  Filled 2019-09-27: qty 1

## 2019-09-27 MED ORDER — SODIUM CHLORIDE 0.9 % IV SOLN
INTRAVENOUS | Status: DC | PRN
  Administered 2019-09-27: 500 mL

## 2019-09-27 MED ORDER — LABETALOL HCL 5 MG/ML IV SOLN: 10.0000 mg | INTRAVENOUS | Status: DC | PRN

## 2019-09-27 MED ORDER — HYPROMELLOSE (GONIOSCOPIC) 2.5 % OP SOLN
2.0000 [drp] | Freq: Four times a day (QID) | OPHTHALMIC | Status: DC | PRN
  Administered 2019-09-27: 2 [drp] via OPHTHALMIC
  Filled 2019-09-27: qty 15

## 2019-09-27 MED ORDER — CHLORHEXIDINE GLUCONATE 4 % EX LIQD: 60.0000 mL | Freq: Once | CUTANEOUS | Status: DC

## 2019-09-27 MED ORDER — METOPROLOL TARTRATE 5 MG/5ML IV SOLN: 2.0000 mg | INTRAVENOUS | Status: DC | PRN

## 2019-09-27 MED ORDER — SODIUM CHLORIDE 0.9 % IV SOLN
INTRAVENOUS | Status: AC
  Filled 2019-09-27: qty 1.2

## 2019-09-27 MED ORDER — PHENOL 1.4 % MT LIQD: 1.0000 | OROMUCOSAL | Status: DC | PRN

## 2019-09-27 MED ORDER — SODIUM CHLORIDE 0.9 % IV SOLN: INTRAVENOUS | Status: DC

## 2019-09-27 MED ORDER — HEMOSTATIC AGENTS (NO CHARGE) OPTIME
TOPICAL | Status: DC | PRN
  Administered 2019-09-27: 1 via TOPICAL

## 2019-09-27 MED ORDER — SODIUM CHLORIDE 0.9 % IV SOLN
INTRAVENOUS | Status: DC | PRN
  Administered 2019-09-27 (×2): .05 ug/kg/min via INTRAVENOUS

## 2019-09-27 MED ORDER — PHENYLEPHRINE HCL (PRESSORS) 10 MG/ML IV SOLN
INTRAVENOUS | Status: DC | PRN
  Administered 2019-09-27 (×2): 100 ug via INTRAVENOUS

## 2019-09-27 MED ORDER — LIDOCAINE HCL (PF) 1 % IJ SOLN
INTRAMUSCULAR | Status: AC
  Filled 2019-09-27: qty 30

## 2019-09-27 MED ORDER — HEPARIN SODIUM (PORCINE) 1000 UNIT/ML IJ SOLN
INTRAMUSCULAR | Status: DC | PRN
  Administered 2019-09-27: 9000 [IU] via INTRAVENOUS

## 2019-09-27 MED ORDER — PROPOFOL 10 MG/ML IV BOLUS
INTRAVENOUS | Status: AC
  Filled 2019-09-27: qty 20

## 2019-09-27 MED ORDER — PHENYLEPHRINE HCL-NACL 10-0.9 MG/250ML-% IV SOLN
INTRAVENOUS | Status: DC | PRN
  Administered 2019-09-27: 50 ug/min via INTRAVENOUS

## 2019-09-27 MED ORDER — PROPOFOL 10 MG/ML IV BOLUS
INTRAVENOUS | Status: DC | PRN
  Administered 2019-09-27: 150 mg via INTRAVENOUS
  Administered 2019-09-27: 50 mg via INTRAVENOUS

## 2019-09-27 MED ORDER — ONDANSETRON HCL 4 MG/2ML IJ SOLN
INTRAMUSCULAR | Status: DC | PRN
  Administered 2019-09-27: 4 mg via INTRAVENOUS

## 2019-09-27 MED ORDER — PRAVASTATIN SODIUM 40 MG PO TABS
40.0000 mg | ORAL_TABLET | Freq: Every day | ORAL | Status: DC
  Administered 2019-09-28: 08:00:00 40 mg via ORAL
  Filled 2019-09-27: qty 1

## 2019-09-27 MED ORDER — VANCOMYCIN HCL IN DEXTROSE 1-5 GM/200ML-% IV SOLN
1000.0000 mg | INTRAVENOUS | Status: AC
  Administered 2019-09-27: 1000 mg via INTRAVENOUS
  Filled 2019-09-27: qty 200

## 2019-09-27 MED ORDER — PROTAMINE SULFATE 10 MG/ML IV SOLN
INTRAVENOUS | Status: DC | PRN
  Administered 2019-09-27: 15 mg via INTRAVENOUS
  Administered 2019-09-27: 10 mg via INTRAVENOUS
  Administered 2019-09-27: 25 mg via INTRAVENOUS

## 2019-09-27 MED ORDER — MIDAZOLAM HCL 2 MG/2ML IJ SOLN
INTRAMUSCULAR | Status: AC
  Filled 2019-09-27: qty 2

## 2019-09-27 MED ORDER — ESCITALOPRAM OXALATE 10 MG PO TABS
10.0000 mg | ORAL_TABLET | Freq: Every day | ORAL | Status: DC
  Administered 2019-09-27 – 2019-09-28 (×2): 10 mg via ORAL
  Filled 2019-09-27 (×2): qty 1

## 2019-09-27 MED ORDER — FENTANYL CITRATE (PF) 100 MCG/2ML IJ SOLN
INTRAMUSCULAR | Status: AC
  Filled 2019-09-27: qty 2

## 2019-09-27 MED ORDER — MORPHINE SULFATE (PF) 2 MG/ML IV SOLN: 2.0000 mg | INTRAVENOUS | Status: DC | PRN

## 2019-09-27 MED ORDER — HYPROMELLOSE (GONIOSCOPIC) 2.5 % OP SOLN: 2.0000 [drp] | Freq: Four times a day (QID) | OPHTHALMIC | Status: DC | PRN

## 2019-09-27 MED ORDER — ONDANSETRON HCL 4 MG/2ML IJ SOLN: 4.0000 mg | Freq: Once | INTRAMUSCULAR | Status: DC | PRN

## 2019-09-27 MED ORDER — MONTELUKAST SODIUM 10 MG PO TABS
10.0000 mg | ORAL_TABLET | Freq: Every day | ORAL | Status: DC
  Administered 2019-09-27: 10 mg via ORAL
  Filled 2019-09-27: qty 1

## 2019-09-27 MED ORDER — ROCURONIUM BROMIDE 100 MG/10ML IV SOLN
INTRAVENOUS | Status: DC | PRN
  Administered 2019-09-27 (×2): 10 mg via INTRAVENOUS
  Administered 2019-09-27: 50 mg via INTRAVENOUS
  Administered 2019-09-27: 10 mg via INTRAVENOUS

## 2019-09-27 MED ORDER — ACETAMINOPHEN 325 MG PO TABS
325.0000 mg | ORAL_TABLET | ORAL | Status: DC | PRN
  Administered 2019-09-27: 650 mg via ORAL
  Filled 2019-09-27: qty 2

## 2019-09-27 MED ORDER — LOSARTAN POTASSIUM 50 MG PO TABS
100.0000 mg | ORAL_TABLET | Freq: Every day | ORAL | Status: DC
  Administered 2019-09-28: 100 mg via ORAL
  Filled 2019-09-27 (×2): qty 2

## 2019-09-27 MED ORDER — FENTANYL CITRATE (PF) 100 MCG/2ML IJ SOLN
25.0000 ug | INTRAMUSCULAR | Status: DC | PRN
  Administered 2019-09-27: 50 ug via INTRAVENOUS

## 2019-09-27 MED ORDER — ASPIRIN EC 81 MG PO TBEC: 81.0000 mg | DELAYED_RELEASE_TABLET | Freq: Every day | ORAL | Status: DC

## 2019-09-27 MED ORDER — SUGAMMADEX SODIUM 200 MG/2ML IV SOLN
INTRAVENOUS | Status: DC | PRN
  Administered 2019-09-27: 200 mg via INTRAVENOUS

## 2019-09-27 MED ORDER — POTASSIUM CHLORIDE CRYS ER 20 MEQ PO TBCR: 20.0000 meq | EXTENDED_RELEASE_TABLET | Freq: Every day | ORAL | Status: DC | PRN

## 2019-09-27 MED ORDER — LACTATED RINGERS IV SOLN: INTRAVENOUS | Status: DC | PRN

## 2019-09-27 MED ORDER — FENTANYL CITRATE (PF) 250 MCG/5ML IJ SOLN
INTRAMUSCULAR | Status: AC
  Filled 2019-09-27: qty 5

## 2019-09-27 MED ORDER — ONDANSETRON HCL 4 MG/2ML IJ SOLN: 4.0000 mg | Freq: Four times a day (QID) | INTRAMUSCULAR | Status: DC | PRN

## 2019-09-27 MED ORDER — VANCOMYCIN HCL IN DEXTROSE 1-5 GM/200ML-% IV SOLN
1000.0000 mg | INTRAVENOUS | Status: DC
  Filled 2019-09-27: qty 200

## 2019-09-27 MED ORDER — OXYCODONE-ACETAMINOPHEN 5-325 MG PO TABS: 1.0000 | ORAL_TABLET | ORAL | Status: DC | PRN

## 2019-09-27 MED ORDER — GUAIFENESIN-DM 100-10 MG/5ML PO SYRP: 15.0000 mL | ORAL_SOLUTION | ORAL | Status: DC | PRN

## 2019-09-27 MED ORDER — ACETAMINOPHEN 325 MG RE SUPP: 325.0000 mg | RECTAL | Status: DC | PRN

## 2019-09-27 MED ORDER — DOCUSATE SODIUM 100 MG PO CAPS
100.0000 mg | ORAL_CAPSULE | Freq: Every day | ORAL | Status: DC
  Administered 2019-09-28: 100 mg via ORAL
  Filled 2019-09-27: qty 1

## 2019-09-27 MED ORDER — SODIUM CHLORIDE 0.9 % IV SOLN: 500.0000 mL | Freq: Once | INTRAVENOUS | Status: DC | PRN

## 2019-09-27 MED ORDER — ALUM & MAG HYDROXIDE-SIMETH 200-200-20 MG/5ML PO SUSP: 15.0000 mL | ORAL | Status: DC | PRN

## 2019-09-27 MED ORDER — PANTOPRAZOLE SODIUM 40 MG PO TBEC
40.0000 mg | DELAYED_RELEASE_TABLET | Freq: Every day | ORAL | Status: DC
  Administered 2019-09-28: 40 mg via ORAL
  Filled 2019-09-27: qty 1

## 2019-09-27 MED ORDER — FENTANYL CITRATE (PF) 100 MCG/2ML IJ SOLN
INTRAMUSCULAR | Status: DC | PRN
  Administered 2019-09-27: 100 ug via INTRAVENOUS

## 2019-09-27 MED ORDER — LIDOCAINE 2% (20 MG/ML) 5 ML SYRINGE
INTRAMUSCULAR | Status: DC | PRN
  Administered 2019-09-27: 30 mg via INTRAVENOUS

## 2019-09-27 MED ORDER — MAGNESIUM SULFATE 2 GM/50ML IV SOLN: 2.0000 g | Freq: Every day | INTRAVENOUS | Status: DC | PRN

## 2019-09-27 MED ORDER — MIDAZOLAM HCL 5 MG/5ML IJ SOLN
INTRAMUSCULAR | Status: DC | PRN
  Administered 2019-09-27: 1 mg via INTRAVENOUS

## 2019-09-27 MED ORDER — 0.9 % SODIUM CHLORIDE (POUR BTL) OPTIME
TOPICAL | Status: DC | PRN
  Administered 2019-09-27: 2000 mL

## 2019-09-27 MED ORDER — HYDROCHLOROTHIAZIDE 25 MG PO TABS
25.0000 mg | ORAL_TABLET | Freq: Every day | ORAL | Status: DC
  Administered 2019-09-28: 25 mg via ORAL
  Filled 2019-09-27: qty 1

## 2019-09-27 MED ORDER — SODIUM CHLORIDE 0.9 % IV SOLN: INTRAVENOUS | Status: DC | PRN

## 2019-09-27 MED ORDER — EPHEDRINE SULFATE 50 MG/ML IJ SOLN
INTRAMUSCULAR | Status: DC | PRN
  Administered 2019-09-27: 10 mg via INTRAVENOUS

## 2019-09-27 SURGICAL SUPPLY — 49 items
CANISTER SUCT 3000ML PPV (MISCELLANEOUS) ×2 IMPLANT
CATH ROBINSON RED A/P 18FR (CATHETERS) ×2 IMPLANT
CATH SUCT 10FR WHISTLE TIP (CATHETERS) ×2 IMPLANT
CLIP VESOCCLUDE MED 6/CT (CLIP) ×2 IMPLANT
CLIP VESOCCLUDE SM WIDE 6/CT (CLIP) ×2 IMPLANT
COVER PROBE W GEL 5X96 (DRAPES) ×2 IMPLANT
DERMABOND ADVANCED (GAUZE/BANDAGES/DRESSINGS) ×1
DERMABOND ADVANCED .7 DNX12 (GAUZE/BANDAGES/DRESSINGS) ×1 IMPLANT
DRAIN CHANNEL 15F RND FF W/TCR (WOUND CARE) IMPLANT
ELECT REM PT RETURN 9FT ADLT (ELECTROSURGICAL) ×2
ELECTRODE REM PT RTRN 9FT ADLT (ELECTROSURGICAL) ×1 IMPLANT
EVACUATOR SILICONE 100CC (DRAIN) IMPLANT
GLOVE BIO SURGEON STRL SZ 6.5 (GLOVE) ×4 IMPLANT
GLOVE BIOGEL PI IND STRL 6.5 (GLOVE) ×2 IMPLANT
GLOVE BIOGEL PI IND STRL 7.5 (GLOVE) ×1 IMPLANT
GLOVE BIOGEL PI INDICATOR 6.5 (GLOVE) ×2
GLOVE BIOGEL PI INDICATOR 7.5 (GLOVE) ×1
GLOVE ECLIPSE 6.5 STRL STRAW (GLOVE) ×2 IMPLANT
GLOVE SURG SS PI 7.5 STRL IVOR (GLOVE) ×2 IMPLANT
GOWN STRL REUS W/ TWL LRG LVL3 (GOWN DISPOSABLE) ×2 IMPLANT
GOWN STRL REUS W/ TWL XL LVL3 (GOWN DISPOSABLE) ×1 IMPLANT
GOWN STRL REUS W/TWL LRG LVL3 (GOWN DISPOSABLE) ×2
GOWN STRL REUS W/TWL XL LVL3 (GOWN DISPOSABLE) ×1
HEMOSTAT SNOW SURGICEL 2X4 (HEMOSTASIS) ×2 IMPLANT
INSERT FOGARTY SM (MISCELLANEOUS) IMPLANT
KIT BASIN OR (CUSTOM PROCEDURE TRAY) ×2 IMPLANT
KIT SHUNT ARGYLE CAROTID ART 6 (VASCULAR PRODUCTS) IMPLANT
KIT TURNOVER KIT B (KITS) ×2 IMPLANT
NEEDLE HYPO 25GX1X1/2 BEV (NEEDLE) IMPLANT
NS IRRIG 1000ML POUR BTL (IV SOLUTION) ×6 IMPLANT
PACK CAROTID (CUSTOM PROCEDURE TRAY) ×2 IMPLANT
PAD ARMBOARD 7.5X6 YLW CONV (MISCELLANEOUS) ×4 IMPLANT
PATCH VASC XENOSURE 1CMX6CM (Vascular Products) ×1 IMPLANT
PATCH VASC XENOSURE 1X6 (Vascular Products) ×1 IMPLANT
POSITIONER HEAD DONUT 9IN (MISCELLANEOUS) ×2 IMPLANT
SET WALTER ACTIVATION W/DRAPE (SET/KITS/TRAYS/PACK) IMPLANT
SHUNT CAROTID BYPASS 10 (VASCULAR PRODUCTS) IMPLANT
SHUNT CAROTID BYPASS 12FRX15.5 (VASCULAR PRODUCTS) IMPLANT
SUT ETHILON 3 0 PS 1 (SUTURE) IMPLANT
SUT PROLENE 6 0 BV (SUTURE) ×12 IMPLANT
SUT SILK 3 0 (SUTURE) ×1
SUT SILK 3-0 18XBRD TIE 12 (SUTURE) ×1 IMPLANT
SUT VIC AB 3-0 SH 27 (SUTURE) ×2
SUT VIC AB 3-0 SH 27X BRD (SUTURE) ×2 IMPLANT
SUT VIC AB 3-0 X1 27 (SUTURE) ×2 IMPLANT
SUT VICRYL 4-0 PS2 18IN ABS (SUTURE) ×2 IMPLANT
SYR CONTROL 10ML LL (SYRINGE) IMPLANT
TOWEL GREEN STERILE (TOWEL DISPOSABLE) ×2 IMPLANT
WATER STERILE IRR 1000ML POUR (IV SOLUTION) ×2 IMPLANT

## 2019-09-27 NOTE — Anesthesia Procedure Notes (Signed)
Arterial Line Insertion Start/End12/23/2020 9:20 AM, 09/27/2019 9:25 AM Performed by: Colin Benton, CRNA, CRNA  Patient location: Pre-op. Preanesthetic checklist: patient identified, IV checked, site marked, risks and benefits discussed, surgical consent, monitors and equipment checked, pre-op evaluation, timeout performed and anesthesia consent Lidocaine 1% used for infiltration Left, radial was placed Catheter size: 20 G Hand hygiene performed , maximum sterile barriers used  and Seldinger technique used Allen's test indicative of satisfactory collateral circulation Attempts: 1 Procedure performed without using ultrasound guided technique. Following insertion, dressing applied and Biopatch. Post procedure assessment: normal and unchanged  Patient tolerated the procedure well with no immediate complications.

## 2019-09-27 NOTE — Transfer of Care (Signed)
Immediate Anesthesia Transfer of Care Note  Patient: Altheia Shadwell  Procedure(s) Performed: ENDARTERECTOMY CAROTID LEFT (Left Neck) Patch Angioplasty Using Xenosure (Left Neck)  Patient Location: PACU  Anesthesia Type:General  Level of Consciousness: awake, alert , oriented and patient cooperative  Airway & Oxygen Therapy: Patient Spontanous Breathing and Patient connected to nasal cannula oxygen  Post-op Assessment: Report given to RN, Post -op Vital signs reviewed and stable, Patient moving all extremities X 4 and Patient able to stick tongue midline  Post vital signs: Reviewed and stable  Last Vitals:  Vitals Value Taken Time  BP 128/44 09/27/19 1411  Temp    Pulse 72 09/27/19 1413  Resp 27 09/27/19 1413  SpO2 100 % 09/27/19 1413  Vitals shown include unvalidated device data.  Last Pain:  Vitals:   09/27/19 0847  TempSrc:   PainSc: 0-No pain         Complications: No apparent anesthesia complications

## 2019-09-27 NOTE — Anesthesia Procedure Notes (Signed)

## 2019-09-27 NOTE — Progress Notes (Addendum)
  Day of Surgery Note    Subjective:  S/p left CEA.  She is complaining of right eye irritation. Denies vision disturbance or headache.   Vitals:   09/27/19 1456 09/27/19 1540  BP: (!) 114/41 (!) 119/47  Pulse: 66 (!) 59  Resp: 15 13  Temp:  98.9 F (37.2 C)  SpO2: 100% 94%    Incisions:   No hematoma Extremities:  Moves all extremities well. 5/5 bil grip strength Cardiac:  RRR Lungs: non labored Abdomen:   Neuro: A and O x 4; tongue midline; no facial asymmetry   Assessment/Plan:  This is a 73 y.o. female who is s/p Left ECA    -Saline eye drops PRN   Risa Grill, PA-C 09/27/2019 4:35 PM [phone]

## 2019-09-27 NOTE — Anesthesia Postprocedure Evaluation (Signed)
Anesthesia Post Note  Patient: Kristina [name]  Procedure(s) Performed: ENDARTERECTOMY CAROTID LEFT (Left Neck) Patch Angioplasty Using Xenosure (Left Neck)     Patient location during evaluation: PACU Anesthesia Type: General Level of consciousness: awake and alert Pain management: pain level controlled Vital Signs Assessment: post-procedure vital signs reviewed and stable Respiratory status: spontaneous breathing, nonlabored ventilation, respiratory function stable and patient connected to nasal cannula oxygen Cardiovascular status: blood pressure returned to baseline and stable Postop Assessment: no apparent nausea or vomiting Anesthetic complications: no    Last Vitals:  Vitals:   09/27/19 1700 09/27/19 1800  BP: 101/71 98/85  Pulse: 68 62  Resp: 14 15  Temp:    SpO2: 100% 99%    Last Pain:  Vitals:   09/27/19 1540  TempSrc: Oral  PainSc: 3                  Tiajuana Amass

## 2019-09-27 NOTE — Progress Notes (Signed)
Pt alert, appropriate, oriented x's 4.  Complains of irritation "hard to describe" in right eye.  Eye drops given per order and cold rag providing some relief.  VS stable.  Will con't plan of care

## 2019-09-27 NOTE — Discharge Instructions (Signed)
vvs  [facility]  Discharge Instructions   Carotid Endarterectomy (CEA)  Please refer to the following instructions for your post-procedure care. Your surgeon or physician assistant will discuss any changes with you.  Activity  You are encouraged to walk as much as you can. You can slowly return to normal activities but must avoid strenuous activity and heavy lifting until your doctor tell you it's OK. Avoid activities such as vacuuming or swinging a golf club. You can drive after one week if you are comfortable and you are no longer taking prescription pain medications. It is normal to feel tired for serval weeks after your surgery. It is also normal to have difficulty with sleep habits, eating, and bowel movements after surgery. These will go away with time.  Bathing/Showering  You may shower after you come home. Do not soak in a bathtub, hot tub, or swim until the incision heals completely.  Incision Care  Shower every day. Clean your incision with mild soap and water. Pat the area dry with a clean towel. You do not need a bandage unless otherwise instructed. Do not apply any ointments or creams to your incision. You may have skin glue on your incision. Do not peel it off. It will come off on its own in about one week. Your incision may feel thickened and raised for several weeks after your surgery. This is normal and the skin will soften over time. For Men Only: It's OK to shave around the incision but do not shave the incision itself for 2 weeks. It is common to have numbness under your chin that could last for several months.  Diet  Resume your normal diet. There are no special food restrictions following this procedure. A low fat/low cholesterol diet is recommended for all patients with vascular disease. In order to heal from your surgery, it is CRITICAL to get adequate nutrition. Your body requires vitamins, minerals, and protein. Vegetables are the  best source of vitamins and minerals. Vegetables also provide the perfect balance of protein. Processed food has little nutritional value, so try to avoid this.        Medications  Resume taking all of your medications unless your doctor or physician assistant tells you not to. If your incision is causing pain, you may take over-the- counter pain relievers such as acetaminophen (Tylenol). If you were prescribed a stronger pain medication, please be aware these medications can cause nausea and constipation. Prevent nausea by taking the medication with a snack or meal. Avoid constipation by drinking plenty of fluids and eating foods with a high amount of fiber, such as fruits, vegetables, and grains. Do not take Tylenol if you are taking prescription pain medications.  Follow Up  Our office will schedule a follow up appointment 2-3 weeks following discharge.  Please call us immediately for any of the following conditions  Increased pain, redness, drainage (pus) from your incision site. Fever of 101 degrees or higher. If you should develop stroke (slurred speech, difficulty swallowing, weakness on one side of your body, loss of vision) you should call 911 and go to the nearest emergency room.  Reduce your risk of vascular disease:  Stop smoking. If you would like help call [facility] at 1-800-QUIT-NOW 4450836721) or [place] at (478)630-8972. Manage your cholesterol Maintain a desired weight Control your diabetes Keep your blood pressure down  If you have any questions, please call the office at [phone].

## 2019-09-27 NOTE — Interval H&P Note (Signed)
History and Physical Interval Note:  09/27/2019 10:13 AM  Kristina [name]  has presented today for surgery, with the diagnosis of LEFT CAROTID ARTHERY STENOSIS.  The various methods of treatment have been discussed with the patient and family. After consideration of risks, benefits and other options for treatment, the patient has consented to  Procedure(s): ENDARTERECTOMY CAROTID LEFT (Left) as a surgical intervention.  [name] history has been reviewed, patient examined, no change in status, stable for surgery.  I have reviewed the patient's chart and labs.  Questions were answered to the patient's satisfaction.     Annamarie Major

## 2019-09-27 NOTE — Op Note (Signed)
Patient name: Kristina [name] MRN: [id number] DOB: [birth year] Sex: female  09/27/2019 Pre-operative Diagnosis: Symptomatic   left carotid stenosis Post-operative diagnosis:  Same Surgeon:  Annamarie Major Assistants: Risa Grill Procedure:    left carotid Endarterectomy with bovine pericardial patch angioplasty Anesthesia:  General Blood Loss:  150 Specimens: None  Findings: 90%stenosis; Thrombus: None  Indications: I saw the patient 2 days ago.  She had been having left eye amaurosis fugax for several months.  Carotid duplex revealed a high-grade left carotid stenosis.  Other than her amaurosis, she has not had any neurologic symptoms.  She comes in today for carotid endarterectomy.  Procedure:  The patient was identified in the holding area and taken to [place] 16  The patient was then placed supine on the table.   General endotrachial anesthesia was administered.  The patient was prepped and draped in the usual sterile fashion.  A time out was called and antibiotics were administered.  The incision was made along the anterior border of the left sternocleidomastoid muscle.  Cautery was used to dissect through the subcutaneous tissue.  The platysma muscle was divided with cautery.  The internal jugular vein was exposed along its anterior medial border.  The common facial vein was exposed and then divided between 2-0 silk ties and metal clips.  The common carotid artery was then circumferentially exposed and encircled with an umbilical tape.  The vagus nerve was identified and protected.  Next sharp dissection was used to expose the external carotid artery and the superior thyroid artery.  The were encircled with a blue vessel loop and a 2-0 silk tie respectively.  Finally, the internal carotid was carefully dissected free.  An umbilical tape was placed around the internal carotid artery distal to the diseased segment.  The hypoglossal nerve was visualized throughout and protected.  The  patient was given systemic heparinization.  A bovine carotid patch was selected and prepared on the back table.  A 10 french shunt was also prepared.  After blood pressure readings were appropriate and the heparin had been given time to circulate, the internal carotid artery was occluded with a baby Gregory clamp.  The external and common carotid arteries were then occluded with vascular clamps and the 2-0 tie tightened on the superior thyroid artery.  A #11 blade was used to make an arteriotomy in the common carotid artery.  This was extended with Potts scissors along the anterior and lateral border of the common and internal carotid artery.  Approximately 90% stenosis was identified.  There was no thrombus identified.  The 10 french shunt was placed, as there was pulsatile backbleeding.  A kleiner kuntz elevator was used to perform endarterectomy.  An eversion endarterectomy was performed in the external carotid artery.  A good distal endpoint was obtained in the internal carotid artery.  The specimen was removed and sent to pathology.  Heparinized saline was used to irrigate the endarterectomized field.  All potential embolic debris was removed.  Bovine pericardial patch angioplasty was then performed using a running 6-0 Prolene.  The common internal and external carotid arteries were all appropriately flushed. The artery was again irrigated with heparin saline.  The anastomosis was then secured. The clamp was first released on the external carotid artery followed by the common carotid artery approximately 30 seconds later, bloodflow was reestablish through the internal carotid artery.  Next, a hand-held  Doppler was used to evaluate the signals in the common, external, and internal  carotid  arteries, all of which had appropriate signals. I then administered  50 mg protamine. The wound was then irrigated.  After hemostasis was achieved, the carotid sheath was reapproximated with 3-0 Vicryl. The  platysma  muscle was reapproximated with running 3-0 Vicryl. The skin  was closed with 4-0 Vicryl. Dermabond was placed on the skin. The  patient was then successfully extubated. Her neurologic exam was  similar to his preprocedural exam. The patient was then taken to recovery room  in stable condition. There were no complications.     Disposition:  To PACU in stable condition.  Relevant Operative Details: Nearly occlusive exophytic plaque in the proximal internal carotid artery.  I did not place a shunt as there was pulsatile backbleeding.  A bovine pericardial patch was placed.  Theotis Burrow, M.D., [facility] [place] Office: [phone] Pager:  [phone]

## 2019-09-28 ENCOUNTER — Encounter: Payer: Self-pay | Admitting: *Deleted

## 2019-09-28 LAB — BASIC METABOLIC PANEL
Anion gap: 10 (ref 5–15)
BUN: 15 mg/dL (ref 8–23)
CO2: 26 mmol/L (ref 22–32)
Calcium: 8.6 mg/dL — ABNORMAL LOW (ref 8.9–10.3)
Chloride: 102 mmol/L (ref 98–111)
Creatinine, Ser: 0.55 mg/dL (ref 0.44–1.00)
GFR calc Af Amer: >60 mL/min (ref >60)
GFR calc non Af Amer: >60 mL/min (ref >60)
Glucose, Bld: 115 mg/dL — ABNORMAL HIGH (ref 70–99)
Potassium: 4.2 mmol/L (ref 3.5–5.1)
Sodium: 138 mmol/L (ref 135–145)

## 2019-09-28 LAB — CBC
HCT: 30.9 % — ABNORMAL LOW (ref 36.0–46.0)
Hemoglobin: 9.5 g/dL — ABNORMAL LOW (ref 12.0–15.0)
MCH: 20.2 pg — ABNORMAL LOW (ref 26.0–34.0)
MCHC: 30.7 g/dL (ref 30.0–36.0)
MCV: 65.6 fL — ABNORMAL LOW (ref 80.0–100.0)
Platelets: 233 10*3/uL (ref 150–400)
RBC: 4.71 MIL/uL (ref 3.87–5.11)
RDW: 15.9 % — ABNORMAL HIGH (ref 11.5–15.5)
WBC: 6.4 10*3/uL (ref 4.0–10.5)
nRBC: 0 % (ref 0.0–0.2)

## 2019-09-28 MED ORDER — ACETAMINOPHEN 325 MG PO TABS: 325.0000 mg | ORAL_TABLET | Freq: Four times a day (QID) | ORAL | Status: DC | PRN

## 2019-09-28 NOTE — Discharge Summary (Signed)
Discharge Summary     Kristina [name] 05-12-1946 73 y.o. female  [id number]  Admission Date: 09/27/2019  Discharge Date: 09/28/2019 Physician: Dr. Orvan Falconer Admission Diagnosis: Left carotid artery stenosis [I65.22]   HPI:   This is a 73 y.o. female with history of transient left vision loss.  Carotid artery duplex revealed 80% left cartoid stenosis  Hospital Course:  The patient was admitted to the hospital and taken to the operating room on 09/27/2019 and underwent left carotid endarterectomy with bovine pericardial patch angioplasty.    Findings: 90%stenosis; Thrombus: None  The pt tolerated the procedure well and was transported to the PACU in excellent condition.   By POD 1, the pt neuro status intact.  Her vital signs remained stable and she remianed afebrile.  She had no dysphagia, no face asymmtry, normal motor function of all four extremities, clear speech.  Her incision was without signs of hematoma or infection. Left neck soreness without significant pain. Voiding spontaneously.    The remainder of the hospital course consisted of increasing mobilization and increasing intake of solids without difficulty.   Recent Labs    09/27/19 0844 09/28/19 0500  NA 141 138  K 4.0 4.2  CL 104 102  CO2 27 26  GLUCOSE 97 115*  BUN 14 15  CALCIUM 9.3 8.6*   Recent Labs    09/27/19 0844 09/28/19 0500  WBC 6.5 6.4  HGB 12.0 9.5*  HCT 39.1 30.9*  PLT 274 233   Recent Labs    09/27/19 0844  INR 1.0       Discharge Diagnosis:  Left carotid artery stenosis [I65.22]  Secondary Diagnosis: Patient Active Problem List   Diagnosis Date Noted  . Left carotid artery stenosis 09/27/2019  . Carotid artery disease ([place]) 09/22/2019  . Class 3 severe obesity with serious comorbidity and body mass index (BMI) of 40.0 to 44.9 in adult ([place]) 12/15/2018  . Chronic back pain 11/21/2018  . Hyperlipidemia 02/24/2017  . Essential hypertension 02/24/2017   Past  Medical History:  Diagnosis Date  . Allergic rhinitis   . Anemia glaucoma  . Anxiety   . Back pain   . DDD (degenerative disc disease), lumbar   . High blood pressure   . High cholesterol   . History of stomach ulcers   . Rheumatic fever   . Rheumatoid arteritis (HCC)     Allergies as of 09/28/2019      Reactions   Penicillins Anaphylaxis, Itching   Did it involve swelling of the face/tongue/throat, SOB, or low BP? Yes Did it involve sudden or severe rash/hives, skin peeling, or any reaction on the inside of your mouth or nose? No Did you need to seek medical attention at a hospital or doctor's office? Yes When did it last happen?Childhood allergy If all above answers are "NO", may proceed with cephalosporin use.      Medication List    STOP taking these medications   ibuprofen 200 MG tablet Commonly known as: ADVIL     TAKE these medications   acetaminophen 325 MG tablet Commonly known as: TYLENOL Take 1-2 tablets (325-650 mg total) by mouth every 6 (six) hours as needed for mild pain (or temp >/= 101 F). Notes to patient: Take as needed   AIRBORNE GUMMIES PO Take 3 tablets by mouth daily. Notes to patient: Tomorrow morning 12/25   aspirin EC 81 MG tablet Take 81 mg by mouth daily. Notes to patient: Tomorrow morning 12/25   ELDERBERRY PO Take  2 capsules by mouth daily. Notes to patient: Tomorrow morning 12/25   escitalopram 10 MG tablet Commonly known as: LEXAPRO Take 10 mg by mouth daily. Notes to patient: Tomorrow morning 12/25   GLUCOSAMINE-CHONDROITIN PO Take 1 tablet by mouth 2 (two) times daily. Notes to patient: This evening 12/24   hydrochlorothiazide 25 MG tablet Commonly known as: HYDRODIURIL Take 25 mg by mouth daily. Notes to patient: Tomorrow morning 12/25   losartan 100 MG tablet Commonly known as: COZAAR Take 100 mg by mouth daily. Notes to patient: Tomorrow morning 12/25   montelukast 10 MG tablet Commonly known as: SINGULAIR  TAKE 1 TABLET(10 MG) BY MOUTH AT BEDTIME What changed:   how much to take  how to take this  when to take this  additional instructions Notes to patient: This evening at bedtime 12/24   multivitamin capsule Take 1 capsule by mouth daily. Notes to patient: Tomorrow morning 12/25   pravastatin 40 MG tablet Commonly known as: PRAVACHOL Take 1 tablet (40 mg total) by mouth every evening. What changed: when to take this Notes to patient: This evening 12/24   Vitamin D (Ergocalciferol) 1.25 MG (50000 UT) Caps capsule Commonly known as: DRISDOL Take 1 capsule (50,000 Units total) by mouth every 7 (seven) days. What changed: when to take this Notes to patient: Take as you were at home        [facility] Discharge Instructions Carotid Endarterectomy (CEA)  Please refer to the following instructions for your post-procedure care. Your surgeon or physician assistant will discuss any changes with you.  Activity  You are encouraged to walk as much as you can. You can slowly return to normal activities but must avoid strenuous activity and heavy lifting until your doctor tell you it's OK. Avoid activities such as vacuuming or swinging a golf club. You can drive after one week if you are comfortable and you are no longer taking prescription pain medications. It is normal to feel tired for serval weeks after your surgery. It is also normal to have difficulty with sleep habits, eating, and bowel movements after surgery. These will go away with time.  Bathing/Showering  You may shower after you come home. Do not soak in a bathtub, hot tub, or swim until the incision heals completely.  Incision Care  Shower every day. Clean your incision with mild soap and water. Pat the area dry with a clean towel. You do not need a bandage unless otherwise instructed. Do not apply any ointments or creams to your incision. You may have skin glue on your incision. Do not  peel it off. It will come off on its own in about one week. Your incision may feel thickened and raised for several weeks after your surgery. This is normal and the skin will soften over time. For Men Only: It's OK to shave around the incision but do not shave the incision itself for 2 weeks. It is common to have numbness under your chin that could last for several months.  Diet  Resume your normal diet. There are no special food restrictions following this procedure. A low fat/low cholesterol diet is recommended for all patients with vascular disease. In order to heal from your surgery, it is CRITICAL to get adequate nutrition. Your body requires vitamins, minerals, and protein. Vegetables are the best source of vitamins and minerals. Vegetables also provide the perfect balance of protein. Processed food has little nutritional value, so try to avoid this.  Medications  Resume taking all of your medications unless your doctor or physician assistant tells you not to.  If your incision is causing pain, you may take over-the- counter pain relievers such as acetaminophen (Tylenol). If you were prescribed a stronger pain medication, please be aware these medications can cause nausea and constipation.  Prevent nausea by taking the medication with a snack or meal. Avoid constipation by drinking plenty of fluids and eating foods with a high amount of fiber, such as fruits, vegetables, and grains.  Do not take Tylenol if you are taking prescription pain medications.  Follow Up  Our office will schedule a follow up appointment 2-3 weeks following discharge.  Please call us immediately for any of the following conditions  . Increased pain, redness, drainage (pus) from your incision site. . Fever of 101 degrees or higher. . If you should develop stroke (slurred speech, difficulty swallowing, weakness on one side of your body, loss of vision) you should call 911 and go to the nearest emergency room. .   Reduce your risk of vascular disease:  . Stop smoking. If you would like help call [facility] at 1-800-QUIT-NOW 431-712-6613) or [place] at (639)603-6594. . Manage your cholesterol . Maintain a desired weight . Control your diabetes . Keep your blood pressure down .  If you have any questions, please call the office at [phone].  Prescriptions given: None  Disposition: Home  Patient's condition: is Excellent  Follow up: 1. Dr. Trula Slade in 2 weeks.  Risa Grill, PA-C [facility] [phone]   --- For [facility] use ---   Modified Rankin score at D/C (0-6): 0  IV medication needed for:  1. Hypertension: No 2. Hypotension: No  Post-op Complications: No  1. Post-op CVA or TIA: No  If yes: Event classification (right eye, left eye, right cortical, left cortical, verterobasilar, other):   If yes: Timing of event (intra-op, <6 hrs post-op, >=6 hrs post-op, unknown):   2. CN injury: No  If yes: CN not injuried   3. Myocardial infarction: No  If yes: Dx by (EKG or clinical, Troponin): n/a  4.  CHF: No  5.  Dysrhythmia (new): No  6. Wound infection: No  7. Reperfusion symptoms: No  8. Return to OR: No  If yes: return to OR for (bleeding, neurologic, other CEA incision, other): n/a  Discharge medications: Statin use:  Yes ASA use:  Yes   Beta blocker use:  No ACE-Inhibitor use:  No  ARB use:  Yes CCB use: No P2Y12 Antagonist use: No, [ ]  Plavix, [ ]  Plasugrel, [ ]  Ticlopinine, [ ]  Ticagrelor, [ ]  Other, [ ]  No for medical reason, [ ]  Non-compliant, [ ]  Not-indicated Anti-coagulant use:  No, [ ]  Warfarin, [ ]  Rivaroxaban, [ ]  Dabigatran,

## 2019-09-28 NOTE — Progress Notes (Signed)
D/C instructions given to patient including medication and wound care. All questions answered. IV removed, clean and intact. Friend to escort pt home.  Clyde Canterbury, RN

## 2019-09-28 NOTE — Progress Notes (Addendum)
  Progress Note    09/28/2019 7:00 AM 1 Day Post-Op  Subjective: Out of bed to chair tolerating regular diet.  She denies pain or visual disturbances.  No extremity weakness. her right eye irritation is much improved.    Vitals:   09/28/19 0100 09/28/19 0450  BP: (!) 113/59 (!) 127/45  Pulse: 60 (!) 59  Resp: 15 14  Temp:  98.5 F (36.9 C)  SpO2: 95% 95%    Physical Exam: Cardiac: Heart rate and rhythm are regular. Lungs: Lungs are clear to auscultation bilaterally Incisions: Left neck incision with mild edema and ecchymosis. Extremities: Normal motor function.  5/5 bilateral grip strength Neurologic: Alert and oriented x4.  Face is symmetrical.  Tongue is midline.  Speech is clear.  CBC    Component Value Date/Time   WBC 6.4 09/28/2019 0500   RBC 4.71 09/28/2019 0500   HGB 9.5 (L) 09/28/2019 0500   HGB 11.6 09/06/2019 1040   HCT 30.9 (L) 09/28/2019 0500   HCT 38.2 09/06/2019 1040   PLT 233 09/28/2019 0500   PLT 282 09/06/2019 1040   MCV 65.6 (L) 09/28/2019 0500   MCV 67 (L) 09/06/2019 1040   MCH 20.2 (L) 09/28/2019 0500   MCHC 30.7 09/28/2019 0500   RDW 15.9 (H) 09/28/2019 0500   RDW 17.2 (H) 09/06/2019 1040   LYMPHSABS 2.2 04/01/2018 0857   EOSABS 0.3 04/01/2018 0857   BASOSABS 0.0 04/01/2018 0857    BMET    Component Value Date/Time   NA 138 09/28/2019 0500   NA 140 12/15/2018 1210   K 4.2 09/28/2019 0500   CL 102 09/28/2019 0500   CO2 26 09/28/2019 0500   GLUCOSE 115 (H) 09/28/2019 0500   BUN 15 09/28/2019 0500   BUN 7 (L) 12/15/2018 1210   CREATININE 0.55 09/28/2019 0500   CALCIUM 8.6 (L) 09/28/2019 0500   GFRNONAA >60 09/28/2019 0500   GFRAA >60 09/28/2019 0500     Intake/Output Summary (Last 24 hours) at 09/28/2019 0700 Last data filed at 09/27/2019 2300 Gross per 24 hour  Intake 2323.07 ml  Output 475 ml  Net 1848.07 ml    HOSPITAL MEDICATIONS Scheduled Meds: . aspirin EC  81 mg Oral Q0600  . docusate sodium  100 mg Oral Daily    . escitalopram  10 mg Oral Daily  . hydrochlorothiazide  25 mg Oral Daily  . losartan  100 mg Oral Daily  . montelukast  10 mg Oral QHS  . pantoprazole  40 mg Oral Daily  . pravastatin  40 mg Oral Daily   Continuous Infusions: . sodium chloride    . sodium chloride Stopped (09/27/19 2038)  . magnesium sulfate bolus IVPB    . vancomycin     PRN Meds:.sodium chloride, acetaminophen **OR** acetaminophen, alum & mag hydroxide-simeth, guaiFENesin-dextromethorphan, hydrALAZINE, hydroxypropyl methylcellulose / hypromellose, labetalol, magnesium sulfate bolus IVPB, metoprolol tartrate, morphine injection, ondansetron, oxyCODONE-acetaminophen, phenol, potassium chloride  Assessment:  73 y.o. female is s/p: Left carotid endarterectomy with bovine pericardial patch secondary to symptomatic left carotid stenosis.  Neurologically intact.  Incision without hematoma.    1 Day Post-Op  Plan: -Discharge home with follow-up in 2 weeks   Risa Grill, PA-C [facility] [phone] 09/28/2019  7:00 AM   I agree with the above.  Neuro intact.  Discharge home  [facility]

## 2019-10-05 ENCOUNTER — Other Ambulatory Visit: Payer: Medicare Other

## 2019-10-16 NOTE — Progress Notes (Addendum)
[facility] Note  10/17/2019     CHIEF COMPLAINT Patient presents for Retina Follow Up   HISTORY OF PRESENT ILLNESS: Kristina [name] is a 74 y.o. female who presents to the clinic today for:   HPI    Retina Follow Up    Patient presents with  Other (Transient vision loss).  In left eye.  Severity is moderate.  Duration of 6 weeks.  Since onset it is gradually improving.  I, the attending physician,  performed the HPI with the patient and updated documentation appropriately.          Comments    Patient states no more transient vision loss OS. Had blocked artery in neck cleaned out (12.23.20).        Last edited by Bernarda Caffey, MD on 10/17/2019  1:36 PM. (History)    pt underwent evaluation by cardiology and vascular sx, found to have significant carotid occlusion, underwent left CEA on 12.23.20, pt states everything has been "perfect" since then, her next appt with the vascular surgeon is January 18, she is having an MRI on Friday, she has not had any episodes of vision loss since she had the sx, pt is not using any drops, pt states she is changing primary eye drs   Referring physician: Kathyrn Lass, MD [place],  [place] [id number]  HISTORICAL INFORMATION:   Selected notes from the [place] Referred by Dr. Parke Simmers for eval of transient loss of vision OS.   CURRENT MEDICATIONS: Current Outpatient Medications (Ophthalmic Drugs)  Medication Sig  . dorzolamide-timolol (COSOPT) 22.3-6.8 MG/ML ophthalmic solution Place 1 drop into both eyes 2 (two) times daily.   No current facility-administered medications for this visit. (Ophthalmic Drugs)   Current Outpatient Medications (Other)  Medication Sig  . acetaminophen (TYLENOL) 325 MG tablet Take 1-2 tablets (325-650 mg total) by mouth every 6 (six) hours as needed for mild pain (or temp >/= 101 F).  [name] aspirin EC 81 MG tablet Take 81 mg by mouth daily.  [name] ELDERBERRY PO  Take 2 capsules by mouth daily.  [name] escitalopram (LEXAPRO) 10 MG tablet Take 10 mg by mouth daily.  [name] GLUCOSAMINE-CHONDROITIN PO Take 1 tablet by mouth 2 (two) times daily.   . hydrochlorothiazide (HYDRODIURIL) 25 MG tablet Take 25 mg by mouth daily.   [name] losartan (COZAAR) 100 MG tablet Take 100 mg by mouth daily.  . montelukast (SINGULAIR) 10 MG tablet TAKE 1 TABLET(10 MG) BY MOUTH AT BEDTIME (Patient taking differently: Take 10 mg by mouth at bedtime. )  . Multiple Vitamin (MULTIVITAMIN) capsule Take 1 capsule by mouth daily.  . Multiple Vitamins-Minerals (AIRBORNE GUMMIES PO) Take 3 tablets by mouth daily.   . pravastatin (PRAVACHOL) 40 MG tablet Take 1 tablet (40 mg total) by mouth every evening. (Patient taking differently: Take 40 mg by mouth daily. )  . Vitamin D, Ergocalciferol, (DRISDOL) 1.25 MG (50000 UT) CAPS capsule Take 1 capsule (50,000 Units total) by mouth every 7 (seven) days. (Patient taking differently: Take 50,000 Units by mouth every Monday. )   No current facility-administered medications for this visit. (Other)      REVIEW OF SYSTEMS: ROS    Positive for: Eyes   Negative for: Constitutional, Gastrointestinal, Neurological, Skin, Genitourinary, Musculoskeletal, HENT, Endocrine, Cardiovascular, Respiratory, Psychiatric, Allergic/Imm, Heme/Lymph   Last edited by Roselee Nova D, COT on 10/17/2019  1:02 PM. (History)       ALLERGIES Allergies  Allergen Reactions  .  Penicillins Anaphylaxis and Itching    Did it involve swelling of the face/tongue/throat, SOB, or low BP? Yes Did it involve sudden or severe rash/hives, skin peeling, or any reaction on the inside of your mouth or nose? No Did you need to seek medical attention at a hospital or doctor's office? Yes When did it last happen?Childhood allergy If all above answers are "NO", may proceed with cephalosporin use.     PAST MEDICAL HISTORY Past Medical History:  Diagnosis Date  . Allergic rhinitis   .  Anemia glaucoma  . Anxiety   . Back pain   . DDD (degenerative disc disease), lumbar   . High blood pressure   . High cholesterol   . History of stomach ulcers   . Rheumatic fever   . Rheumatoid arteritis ([place])    Past Surgical History:  Procedure Laterality Date  . APPENDECTOMY  1955  . CHOLECYSTECTOMY  1963  . ENDARTERECTOMY Left 09/27/2019   Procedure: ENDARTERECTOMY CAROTID LEFT;  Surgeon: Serafina Mitchell, MD;  Location: [place];  Service: Vascular;  Laterality: Left;  . HERNIA REPAIR  2007  . HERNIA REPAIR  2015  . PATCH ANGIOPLASTY Left 09/27/2019   Procedure: Patch Angioplasty Using Rueben Bash;  Surgeon: Serafina Mitchell, MD;  Location: [place];  Service: Vascular;  Laterality: Left;  . REPLACEMENT TOTAL KNEE  2016  . TONSILLECTOMY      FAMILY HISTORY Family History  Problem Relation Age of Onset  . Kidney disease Mother   . Hypertension Mother   . Glaucoma Mother   . Emphysema Father   . Asthma Father   . Hypertension Father   . Heart disease Father   . COPD Brother     SOCIAL HISTORY Social History   Tobacco Use  . Smoking status: Former Smoker    Packs/day: 1.50    Years: 30.00    Pack years: 45.00    Types: Cigarettes    Quit date: 2009    Years since quitting: 12.0  . Smokeless tobacco: Never Used  Substance Use Topics  . Alcohol use: Yes    Comment: social  . Drug use: Never         OPHTHALMIC EXAM:  Base Eye Exam    Visual Acuity (Snellen - Linear)      Right Left   Dist cc 20/25 20/25   Dist ph cc NI NI   Correction: Glasses       Tonometry (Tonopen, 1:10 PM)      Right Left   Pressure 20 18       Pupils      Dark Light Shape React APD   Right 4 2 Round Brisk None   Left 4 2 Round Brisk None       Visual Fields (Counting fingers)      Left Right    Full Full       Extraocular Movement      Right Left    Full, Ortho Full, Ortho       Neuro/Psych    Oriented x3: Yes   Mood/Affect: Normal       Dilation    Both  eyes: 1.0% Mydriacyl, 2.5% Phenylephrine @ 1:10 PM        Slit Lamp and Fundus Exam    Slit Lamp Exam      Right Left   Lids/Lashes Dermatochalasis - upper lid, Meibomian gland dysfunction, mild Telangiectasia Dermatochalasis - upper lid, Meibomian gland dysfunction, mild Telangiectasia  Conjunctiva/Sclera White and quiet Mild conj cysts at 0400, otherwise white and quiet   Cornea mild EBMD, trace Punctate epithelial erosions mild EBMD, trace Punctate epithelial erosions   Anterior Chamber Deep and quiet Deep and quiet   Iris Round and dilated Round and dilated   Lens 2-3+ Nuclear sclerosis with mild brunescence, 2+ Cortical cataract 2-3+ Nuclear sclerosis with mild brunescence, 2+ Cortical cataract   Vitreous Mild Vitreous syneresis Mild Vitreous syneresis       Fundus Exam      Right Left   Disc Pink and Sharp, narrow inferior rim, +cupping Pink and Sharp, +cupping, mild temporal PPP   C/D Ratio 0.7 0.7   Macula Flat, Blunted foveal reflex, mild Retinal pigment epithelial mottling, No heme or edema Flat, Blunted foveal reflex, mild Retinal pigment epithelial mottling and clumping, No heme or edema   Vessels Mild Vascular attenuation, AV crossing changes Vascular attenuation, Tortuous, AV crossing changes   Periphery Attached, no heme  Attached, no heme          IMAGING AND PROCEDURES  Imaging and Procedures for @TODAY @  OCT, Retina - OU - Both Eyes       Right Eye Quality was good. Central Foveal Thickness: 260. Progression has been stable. Findings include normal foveal contour, no IRF, no SRF.   Left Eye Quality was good. Central Foveal Thickness: 260. Progression has been stable. Findings include normal foveal contour, no IRF, no SRF.   Notes *Images captured and stored on drive  Diagnosis / Impression:  NFP, no IRF/SRF OU  Clinical management:  See below  Abbreviations: NFP - Normal foveal profile. CME - cystoid macular edema. PED - pigment epithelial  detachment. IRF - intraretinal fluid. SRF - subretinal fluid. EZ - ellipsoid zone. ERM - epiretinal membrane. ORA - outer retinal atrophy. ORT - outer retinal tubulation. SRHM - subretinal hyper-reflective material                 ASSESSMENT/PLAN:    ICD-10-CM   1. 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 Stenosis of left carotid artery  I65.22   4. Retinal edema  H35.81 OCT, Retina - OU - Both Eyes  5. Essential hypertension  I10   6. Hypertensive retinopathy of both eyes  H35.033   7. Glaucoma suspect of both eyes  H40.003   8. Combined forms of age-related cataract of both eyes  H25.813     1-3. Transient Vision Loss OS secondary to ocular ischemic syndrome and L carotid stenosis  - pt reports episodes of "graying out" of vision described as curtain coming from below -- initial onset 3-4 mos ago; variable duration and intensity/degree of vision loss; duration on the order of minutes; frequency is multiple per week, with increased frequency of episodes over the week prior to last visit.  - dilated eye exam fairly benign -- mild hypertensive changes in vasculature; no hemorrhage  - FA (12.01.20) showed mildly delayed filling time OS -- otherwise normal study OU without leakage or vascular perfusion defects  - concern for vascular etiology / transient retinal ischemia  - case discussed with PCP, Dr. Sabra Heck, who arranged for cardiology consult and carotid ultrasound and echocardiogram  - of note, carotid ultrasound on 12.18.2020 showed 80-99% stenosis of left ICA and pt underwent urgent CEA on 12.23.2020 w/ Dr. Trula Slade  - pt now reports resolution of symptoms -- no repeat episodes of transient vision loss  -  eye exam stable and BCVA OS 20/25   - f/u 3 months, DFE, OCT, repeat FA (transit OS)  4. No retinal edema on exam or OCT  5,6. Hypertensive retinopathy OU  - discussed importance of tight BP control  - monitor  7. 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 Parke Simmers -- pt is transferring care to [facility], will start Cosopt BID OU  8. 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  - discussed diagnosis and progression  - monitor  **Pt wishes to transfer primary eye care from Dr. Parke Simmers -- will refer to [facility] for primary eye care and glaucoma and cataract evals**  Ophthalmic Meds Ordered this visit:  Meds ordered this encounter  Medications  . dorzolamide-timolol (COSOPT) 22.3-6.8 MG/ML ophthalmic solution    Sig: Place 1 drop into both eyes 2 (two) times daily.    Dispense:  10 mL    Refill:  4       Return in about 3 months (around 01/15/2020) for f/u transient vision loss OS, DFE, OCT, FA (optos, transit OS).  There are no Patient Instructions on file for this visit.   Explained the diagnoses, plan, and follow up with the patient and they expressed understanding.  Patient expressed understanding of the importance of proper follow up care.   This document serves as a record of services personally performed by Gardiner Sleeper, MD, PhD. It was created on their behalf by Estill Bakes, COT an ophthalmic technician. The creation of this record is the provider's dictation and/or activities during the visit.    Electronically signed by: Estill Bakes, COT 10/16/19 @ 4:09 PM   This document serves as a record of services personally performed by Gardiner Sleeper, MD, PhD. It was created on their behalf by Ernest Mallick, OA, an ophthalmic assistant. The creation of this record is the provider's dictation and/or activities during the visit.    Electronically signed by: Ernest Mallick, OA 01.12.2021 4:09 PM  Gardiner Sleeper, M.D., Ph.D. Diseases & Surgery of the Retina and [place] 10/17/2019   I have reviewed the above  documentation for accuracy and completeness, and I agree with the above. Gardiner Sleeper, M.D., Ph.D. 10/17/19 4:09 PM   Abbreviations: M myopia (nearsighted); A astigmatism; H hyperopia (farsighted); P presbyopia; Mrx spectacle prescription;  CTL contact lenses; OD right eye; OS left eye; OU both eyes  XT exotropia; ET esotropia; PEK punctate epithelial keratitis; PEE punctate epithelial erosions; DES dry eye syndrome; MGD meibomian gland dysfunction; ATs artificial tears; PFAT's preservative free artificial tears; [place] nuclear sclerotic cataract; PSC posterior subcapsular cataract; ERM epi-retinal membrane; PVD posterior vitreous detachment; RD retinal detachment; DM diabetes mellitus; DR diabetic retinopathy; NPDR non-proliferative diabetic retinopathy; PDR proliferative diabetic retinopathy; CSME clinically significant macular edema; DME diabetic macular edema; dbh dot blot hemorrhages; CWS cotton wool spot; POAG primary open angle glaucoma; C/D cup-to-disc ratio; HVF humphrey visual field; GVF goldmann visual field; OCT optical coherence tomography; IOP intraocular pressure; BRVO Branch retinal vein occlusion; CRVO central retinal vein occlusion; CRAO central retinal artery occlusion; BRAO branch retinal artery occlusion; RT retinal tear; SB scleral buckle; PPV pars plana vitrectomy; VH Vitreous hemorrhage; PRP panretinal laser photocoagulation; IVK intravitreal kenalog; VMT vitreomacular traction; MH Macular hole;  NVD neovascularization of the disc; NVE  neovascularization elsewhere; AREDS age related eye disease study; ARMD age related macular degeneration; POAG primary open angle glaucoma; EBMD epithelial/anterior basement membrane dystrophy; ACIOL anterior chamber intraocular lens; IOL intraocular lens; PCIOL posterior chamber intraocular lens; Phaco/IOL phacoemulsification with intraocular lens placement; Parc photorefractive keratectomy; LASIK laser assisted in situ keratomileusis; HTN hypertension; DM  diabetes mellitus; COPD chronic obstructive pulmonary disease

## 2019-10-17 ENCOUNTER — Ambulatory Visit (INDEPENDENT_AMBULATORY_CARE_PROVIDER_SITE_OTHER): Payer: Medicare Other | Admitting: Ophthalmology

## 2019-10-17 ENCOUNTER — Encounter (INDEPENDENT_AMBULATORY_CARE_PROVIDER_SITE_OTHER): Payer: Self-pay | Admitting: Ophthalmology

## 2019-10-17 DIAGNOSIS — H3581 Retinal edema: Secondary | ICD-10-CM

## 2019-10-17 DIAGNOSIS — I10 Essential (primary) hypertension: Secondary | ICD-10-CM

## 2019-10-17 DIAGNOSIS — H3582 Retinal ischemia: Secondary | ICD-10-CM

## 2019-10-17 DIAGNOSIS — H40003 Preglaucoma, unspecified, bilateral: Secondary | ICD-10-CM

## 2019-10-17 DIAGNOSIS — I6522 Occlusion and stenosis of left carotid artery: Secondary | ICD-10-CM

## 2019-10-17 DIAGNOSIS — H539 Unspecified visual disturbance: Secondary | ICD-10-CM

## 2019-10-17 DIAGNOSIS — H25813 Combined forms of age-related cataract, bilateral: Secondary | ICD-10-CM

## 2019-10-17 DIAGNOSIS — H35033 Hypertensive retinopathy, bilateral: Secondary | ICD-10-CM

## 2019-10-17 MED ORDER — DORZOLAMIDE HCL-TIMOLOL MAL 2-0.5 % OP SOLN: 1.0000 [drp] | Freq: Two times a day (BID) | OPHTHALMIC | 4 refills | Status: AC

## 2019-10-20 ENCOUNTER — Other Ambulatory Visit: Payer: Self-pay

## 2019-10-20 ENCOUNTER — Ambulatory Visit
Admission: RE | Admit: 2019-10-20 | Discharge: 2019-10-20 | Disposition: A | Discharge status: Home / Self Care | Payer: Medicare Other | Source: Ambulatory Visit | Attending: Family Medicine | Admitting: Family Medicine

## 2019-10-20 ENCOUNTER — Telehealth (HOSPITAL_COMMUNITY): Payer: Self-pay

## 2019-10-20 DIAGNOSIS — H547 Unspecified visual loss: Secondary | ICD-10-CM

## 2019-10-20 MED ORDER — GADOBENATE DIMEGLUMINE 529 MG/ML IV SOLN
17.0000 mL | Freq: Once | INTRAVENOUS | Status: AC | PRN
  Administered 2019-10-20: 17 mL via INTRAVENOUS

## 2019-10-20 NOTE — Telephone Encounter (Signed)

## 2019-10-23 ENCOUNTER — Ambulatory Visit (INDEPENDENT_AMBULATORY_CARE_PROVIDER_SITE_OTHER): Payer: Self-pay | Admitting: Surgery

## 2019-10-23 ENCOUNTER — Encounter: Payer: Self-pay | Admitting: Surgery

## 2019-10-23 ENCOUNTER — Other Ambulatory Visit: Payer: Self-pay

## 2019-10-23 VITALS — BP 149/63 | HR 60 | Temp 97.8°F | Resp 20 | Ht 63.0 in | Wt 195.5 lb

## 2019-10-23 DIAGNOSIS — I6522 Occlusion and stenosis of left carotid artery: Secondary | ICD-10-CM

## 2019-10-23 NOTE — Progress Notes (Signed)
   Patient name: Kristina [name] MRN: [id number] DOB: May 12, 1946 Sex: female  REASON FOR VISIT:   Postop  HISTORY OF PRESENT ILLNESS:   Kristina [name] is a 74 y.o. female who presented with left eye amaurosis.  Carotid duplex indicated greater than 80% stenosis.  On 09/27/2019 she underwent left carotid endarterectomy with patch angioplasty.  Intraoperative findings included a 90% stenosis with a exophytic plaque in the proximal internal carotid artery.  Her postoperative course was uncomplicated.  CURRENT MEDICATIONS:    Current Outpatient Medications  Medication Sig Dispense Refill  . acetaminophen (TYLENOL) 325 MG tablet Take 1-2 tablets (325-650 mg total) by mouth every 6 (six) hours as needed for mild pain (or temp >/= 101 F).    [name] aspirin EC 81 MG tablet Take 81 mg by mouth daily.    . dorzolamide-timolol (COSOPT) 22.3-6.8 MG/ML ophthalmic solution Place 1 drop into both eyes 2 (two) times daily. 10 mL 4  . ELDERBERRY PO Take 2 capsules by mouth daily.    [name] escitalopram (LEXAPRO) 10 MG tablet Take 10 mg by mouth daily.    [name] GLUCOSAMINE-CHONDROITIN PO Take 1 tablet by mouth 2 (two) times daily.     . hydrochlorothiazide (HYDRODIURIL) 25 MG tablet Take 25 mg by mouth daily.     [name] losartan (COZAAR) 100 MG tablet Take 100 mg by mouth daily.    . montelukast (SINGULAIR) 10 MG tablet TAKE 1 TABLET(10 MG) BY MOUTH AT BEDTIME (Patient taking differently: Take 10 mg by mouth at bedtime. ) 30 tablet 0  . Multiple Vitamin (MULTIVITAMIN) capsule Take 1 capsule by mouth daily.    . Multiple Vitamins-Minerals (AIRBORNE GUMMIES PO) Take 3 tablets by mouth daily.     . pravastatin (PRAVACHOL) 40 MG tablet Take 1 tablet (40 mg total) by mouth every evening. (Patient taking differently: Take 40 mg by mouth daily. ) 90 tablet 3  . Vitamin D, Ergocalciferol, (DRISDOL) 1.25 MG (50000 UT) CAPS capsule Take 1 capsule (50,000 Units total) by mouth every 7 (seven) days.  (Patient taking differently: Take 50,000 Units by mouth every Monday. ) 4 capsule 0   No current facility-administered medications for this visit.    REVIEW OF SYSTEMS:   [X]  denotes positive finding, [ ]  denotes negative finding Cardiac  Comments:  Chest pain or chest pressure:    Shortness of breath upon exertion:    Short of breath when lying flat:    Irregular heart rhythm:    Constitutional    Fever or chills:      PHYSICAL EXAM:   Vitals:   10/23/19 1540 10/23/19 1544  BP: (!) 159/68 (!) 149/63  Pulse: 60   Resp: 20   Temp: 97.8 F (36.6 C)   SpO2: 96%   Weight: 195 lb 8 oz (88.7 kg)   Height: 5\' 3"  (1.6 m)     GENERAL: The patient is a well-nourished female, in no acute distress. The vital signs are documented above. CARDIOVASCULAR: There is a regular rate and rhythm. PULMONARY: Non-labored respirations Incision is well-healed Tongue is midline Smile is symmetric Neuro intact  STUDIES:   None   MEDICAL ISSUES:   Status post left carotid endarterectomy.  She is doing very well at this time.  She will follow-up in 6 months with repeat carotid duplex.  Leia Alf, MD, FACS [facility] [phone] Pager [phone]

## 2019-10-24 ENCOUNTER — Other Ambulatory Visit: Payer: Self-pay | Admitting: *Deleted

## 2019-10-24 ENCOUNTER — Ambulatory Visit: Payer: Medicare Other | Attending: Internal Medicine

## 2019-10-24 DIAGNOSIS — Z23 Encounter for immunization: Secondary | ICD-10-CM

## 2019-10-24 DIAGNOSIS — I6523 Occlusion and stenosis of bilateral carotid arteries: Secondary | ICD-10-CM

## 2019-10-24 NOTE — Progress Notes (Signed)
   Covid-19 Vaccination Clinic  Name:  Kristina [name]    MRN: [id number] DOB: 11/01/1945  10/24/2019  Kristina [name] was observed post Covid-19 immunization for 15 minutes without incidence. She was provided with Vaccine Information Sheet and instruction to access the V-Safe system.   Kristina [name] was instructed to call 911 with any severe reactions post vaccine: [name] Difficulty breathing  . Swelling of your face and throat  . A fast heartbeat  . A bad rash all over your body  . Dizziness and weakness    Immunizations Administered    Name Date Dose VIS Date Route   Pfizer COVID-19 Vaccine 10/24/2019 12:55 PM 0.3 mL 09/15/2019 Intramuscular   Manufacturer: [place]   Lot: [id number]   [place]: [id number]

## 2019-10-31 ENCOUNTER — Other Ambulatory Visit: Payer: Self-pay

## 2019-10-31 ENCOUNTER — Encounter: Payer: Self-pay | Admitting: Allergy and Immunology

## 2019-10-31 ENCOUNTER — Ambulatory Visit: Payer: Medicare Other | Admitting: Allergy and Immunology

## 2019-10-31 VITALS — BP 150/84 | HR 54 | Temp 97.6°F | Resp 18

## 2019-10-31 DIAGNOSIS — J3089 Other allergic rhinitis: Secondary | ICD-10-CM | POA: Diagnosis not present

## 2019-10-31 MED ORDER — MONTELUKAST SODIUM 10 MG PO TABS: 10.0000 mg | ORAL_TABLET | Freq: Every day | ORAL | 1 refills | Status: DC

## 2019-10-31 NOTE — Progress Notes (Signed)
[place]   Follow-up Note  Referring Provider: Kathyrn Lass, MD Primary Provider: Kathyrn Lass, MD Date of Office Visit: 10/31/2019  Subjective:   Kristina [name] (DOB: Feb 02, 1946) is a 74 y.o. female who returns to the [place] on 10/31/2019 in re-evaluation of the following:  HPI: Kristina [name] returns to this clinic in evaluation of allergic rhinitis and a history of recurrent infections.  Her last visit to this clinic was 03 May 2018.  She has really done quite well since her last visit in regard to her airway issue.  She is using montelukast as her only controller agent and does not use a nasal steroid at this point in time.  She has not had any infections requiring a antibiotic.  She is a little stuffy in the morning but this is not a particularly big issue and overall she feels as though the montelukast has resulted in very good control of this issue.  She had a carotid endarterectomy and apparently in one of her MRIs it did identify some "sinus issues" but once again she does not have any issues to suggest sinusitis.  She has no ugly nasal discharge or anosmia or headaches.  Allergies as of 10/31/2019      Reactions   Penicillins Anaphylaxis, Itching   Did it involve swelling of the face/tongue/throat, SOB, or low BP? Yes Did it involve sudden or severe rash/hives, skin peeling, or any reaction on the inside of your mouth or nose? No Did you need to seek medical attention at a hospital or doctor's office? Yes When did it last happen?Childhood allergy If all above answers are "NO", may proceed with cephalosporin use.      Medication List      acetaminophen 325 MG tablet Commonly known as: TYLENOL Take 1-2 tablets (325-650 mg total) by mouth every 6 (six) hours as needed for mild pain (or temp >/= 101 F).   AIRBORNE GUMMIES PO Take 3 tablets by mouth daily.   aspirin EC 81 MG tablet Take 81 mg by  mouth daily.   dorzolamide-timolol 22.3-6.8 MG/ML ophthalmic solution Commonly known as: COSOPT Place 1 drop into both eyes 2 (two) times daily.   ELDERBERRY PO Take 2 capsules by mouth daily.   escitalopram 10 MG tablet Commonly known as: LEXAPRO Take 10 mg by mouth daily.   GLUCOSAMINE-CHONDROITIN PO Take 1 tablet by mouth 2 (two) times daily.   hydrochlorothiazide 25 MG tablet Commonly known as: HYDRODIURIL Take 25 mg by mouth daily.   losartan 100 MG tablet Commonly known as: COZAAR Take 100 mg by mouth daily.   montelukast 10 MG tablet Commonly known as: SINGULAIR TAKE 1 TABLET(10 MG) BY MOUTH AT BEDTIME   multivitamin capsule Take 1 capsule by mouth daily.   pravastatin 40 MG tablet Commonly known as: PRAVACHOL Take 1 tablet (40 mg total) by mouth every evening.   Vitamin D (Ergocalciferol) 1.25 MG (50000 UNIT) Caps capsule Commonly known as: DRISDOL Take 1 capsule (50,000 Units total) by mouth every 7 (seven) days.       Past Medical History:  Diagnosis Date  . Allergic rhinitis   . Anemia glaucoma  . Anxiety   . Back pain   . DDD (degenerative disc disease), lumbar   . High blood pressure   . High cholesterol   . History of stomach ulcers   . Rheumatic fever   . 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 APPENDECTOMY  1955  . CHOLECYSTECTOMY  1963  . ENDARTERECTOMY Left 09/27/2019   Procedure: ENDARTERECTOMY CAROTID LEFT;  Surgeon: Serafina Mitchell, MD;  Location: [place];  Service: Vascular;  Laterality: Left;  . HERNIA REPAIR  2007  . HERNIA REPAIR  2015  . PATCH ANGIOPLASTY Left 09/27/2019   Procedure: Patch Angioplasty Using Rueben Bash;  Surgeon: Serafina Mitchell, MD;  Location: [place];  Service: Vascular;  Laterality: Left;  . REPLACEMENT TOTAL KNEE  2016  . TONSILLECTOMY      Review of systems negative except as noted in HPI / PMHx or noted below:  Review of Systems  Constitutional: Negative.     HENT: Negative.   Eyes: Negative.   Respiratory: Negative.   Cardiovascular: Negative.   Gastrointestinal: Negative.   Genitourinary: Negative.   Musculoskeletal: Negative.   Skin: Negative.   Neurological: Negative.   Endo/Heme/Allergies: Negative.   Psychiatric/Behavioral: Negative.      Objective:   Vitals:   10/31/19 1019  BP: (!) 150/84  Pulse: (!) 54  Resp: 18  Temp: 97.6 F (36.4 C)  SpO2: 96%          Physical Exam Constitutional:      Appearance: She is not diaphoretic.  HENT:     Head: Normocephalic.     Right Ear: Tympanic membrane, ear canal and external ear normal.     Left Ear: Tympanic membrane, ear canal and external ear normal.     Nose: Nose normal. No mucosal edema or rhinorrhea.     Mouth/Throat:     Pharynx: Uvula midline. No oropharyngeal exudate.  Eyes:     Conjunctiva/sclera: Conjunctivae normal.  Neck:     Thyroid: No thyromegaly.     Trachea: Trachea normal. No tracheal tenderness or tracheal deviation.  Cardiovascular:     Rate and Rhythm: Normal rate and regular rhythm.     Heart sounds: Normal heart sounds, S1 normal and S2 normal. No murmur.  Pulmonary:     Effort: No respiratory distress.     Breath sounds: Normal breath sounds. No stridor. No wheezing or rales.  Lymphadenopathy:     Head:     Right side of head: No tonsillar adenopathy.     Left side of head: No tonsillar adenopathy.     Cervical: No cervical adenopathy.  Skin:    Findings: No erythema or rash.     Nails: There is no clubbing.  Neurological:     Mental Status: She is alert.     Diagnostics: none  Assessment and Plan:   1. Other allergic rhinitis      1.  Continue to perform Allergen avoidance measures - dust mite  2.  Continue to Treat and prevent inflammation:   A.  Montelukast 10 mg - 1 tablet 1 time per day  3.  If needed:    A.  Nasal saline  B.  OTC antihistamine   4. Return to clinic if problems  Kristina [name] appears to be doing  very well on her current therapy.  I refilled her montelukast and I have informed her that there is no need for her to return to this clinic on a regular basis unless of course her respiratory tract status changes at some point in the future.  She can have her medications refilled by her primary care doctor.  [name], MD Allergy / Immunology [place]

## 2019-10-31 NOTE — Patient Instructions (Addendum)
  1.  Continue to perform Allergen avoidance measures - dust mite  2.  Continue to Treat and prevent inflammation:   A.  Montelukast 10 mg - 1 tablet 1 time per day  3.  If needed:    A.  Nasal saline  B.  OTC antihistamine   4. Return to clinic if problems

## 2019-11-01 ENCOUNTER — Encounter: Payer: Self-pay | Admitting: Allergy and Immunology

## 2019-11-10 ENCOUNTER — Ambulatory Visit: Payer: Medicare Other

## 2019-11-13 ENCOUNTER — Ambulatory Visit: Payer: Medicare Other | Attending: Internal Medicine

## 2019-11-13 DIAGNOSIS — Z23 Encounter for immunization: Secondary | ICD-10-CM

## 2019-11-13 NOTE — Progress Notes (Signed)
   Covid-19 Vaccination Clinic  Name:  Lilybelle Stasiak    MRN: [id number] DOB: Dec 16, 1945  11/13/2019  Ms. Dreher was observed post Covid-19 immunization for 30 minutes based on pre-vaccination screening without incidence. She was provided with Vaccine Information Sheet and instruction to access the V-Safe system.   Ms. Lijewski was instructed to call 911 with any severe reactions post vaccine: [name] Difficulty breathing  . Swelling of your face and throat  . A fast heartbeat  . A bad rash all over your body  . Dizziness and weakness    Immunizations Administered    Name Date Dose VIS Date Route   Pfizer COVID-19 Vaccine 11/13/2019  1:08 PM 0.3 mL 09/15/2019 Intramuscular   Manufacturer: [place]   Lot: [id number]   [place]: [id number]

## 2019-12-22 ENCOUNTER — Ambulatory Visit: Payer: Medicare Other | Admitting: Cardiovascular Disease

## 2019-12-22 ENCOUNTER — Other Ambulatory Visit: Payer: Self-pay

## 2019-12-22 ENCOUNTER — Encounter: Payer: Self-pay | Admitting: Cardiovascular Disease

## 2019-12-22 VITALS — BP 170/80 | HR 60 | Ht 63.0 in | Wt 194.0 lb

## 2019-12-22 DIAGNOSIS — I350 Nonrheumatic aortic (valve) stenosis: Secondary | ICD-10-CM | POA: Diagnosis not present

## 2019-12-22 DIAGNOSIS — I10 Essential (primary) hypertension: Secondary | ICD-10-CM | POA: Diagnosis not present

## 2019-12-22 DIAGNOSIS — I6523 Occlusion and stenosis of bilateral carotid arteries: Secondary | ICD-10-CM

## 2019-12-22 DIAGNOSIS — E782 Mixed hyperlipidemia: Secondary | ICD-10-CM

## 2019-12-22 LAB — LIPID PANEL
Chol/HDL Ratio: 2.5 ratio (ref 0.0–4.4)
Cholesterol, Total: 160 mg/dL (ref 100–199)
HDL: 64 mg/dL (ref >39)
LDL Chol Calc (NIH): 74 mg/dL (ref 0–99)
Triglycerides: 124 mg/dL (ref 0–149)
VLDL Cholesterol Cal: 22 mg/dL (ref 5–40)

## 2019-12-22 LAB — HEPATIC FUNCTION PANEL
ALT: 37 IU/L — ABNORMAL HIGH (ref 0–32)
AST: 36 IU/L (ref 0–40)
Albumin: 4.6 g/dL (ref 3.7–4.7)
Alkaline Phosphatase: 67 IU/L (ref 39–117)
Bilirubin Total: 0.3 mg/dL (ref 0.0–1.2)
Bilirubin, Direct: 0.1 mg/dL (ref 0.00–0.40)
Total Protein: 7.1 g/dL (ref 6.0–8.5)

## 2019-12-22 NOTE — Assessment & Plan Note (Addendum)
Status post left carotid endarterectomy 09/2019. She denies any headache, dizziness, vision change. She reports adherence to aspirin and pravastatin 40 mg.  -Continue aspirin and statin -Repeat carotid doppler in 2-3 weeks

## 2019-12-22 NOTE — Assessment & Plan Note (Signed)
Mild aortic stenosis with mean gradient of 12.5, Aortic valve peak gradient measures 22.2 mmHg. Aortic valve area, 1.44 cm, per last echo 09/2019.  She denies any dizziness, DOE, presyncope or syncope.   -Repeat echo annually. (Will be due at 09/2020)

## 2019-12-22 NOTE — Assessment & Plan Note (Signed)
Pravastatin increased from 10 to 40 mg on prior visit given the last LDL of 101 on blood work 12/20.  Patient reports adherence. -Check lipid profile today

## 2019-12-22 NOTE — Assessment & Plan Note (Addendum)
Blood pressure today is elevated at 170/80, with heart rate of 60.  Patient reports history of elevated BP when measured in the office.  She reports compliance to losartan 100 mg daily and HCTZ 25 mg daily.  She does not check her blood pressure at home regularly.  -Continue losartan 100 mg daily and HCTZ 25 mg daily -Advised to check blood pressure at least twice a day and record the numbers in BP log and provide it to PCP for medication adjustment if needed

## 2019-12-22 NOTE — Patient Instructions (Signed)
Medication Instructions:  NO CHANGE *If you need a refill on your cardiac medications before your next appointment, please call your pharmacy*   Lab Work: Your physician recommends that you HAVE LAB WORK TODAY If you have labs (blood work) drawn today and your tests are completely normal, you will receive your results only by: [name] MyChart Message (if you have MyChart) OR . A paper copy in the mail If you have any lab test that is abnormal or we need to change your treatment, we will call you to review the results.   Testing/Procedures: Your physician has requested that you have a carotid duplex. This test is an ultrasound of the carotid arteries in your neck. It looks at blood flow through these arteries that supply the brain with blood. Allow one hour for this exam. There are no restrictions or special instructions.[place] physician has requested that you have an echocardiogram. Echocardiography is a painless test that uses sound waves to create images of your heart. It provides your doctor with information about the size and shape of your heart and how well your heart's chambers and valves are working. This procedure takes approximately one hour. There are no restrictions for this procedure.[place]   Follow-Up: At [facility], you and your health needs are our priority.  As part of our continuing mission to provide you with exceptional heart care, we have created designated Provider Care Teams.  These Care Teams include your primary Cardiologist (physician) and Advanced Practice Providers (APPs -  Physician Assistants and Nurse Practitioners) who all work together to provide you with the care you need, when you need it.  We recommend signing up for the patient portal called "MyChart".  Sign up information is provided on this After Visit Summary.  MyChart is used to connect with patients for Virtual Visits (Telemedicine).  Patients are able  to view lab/test results, encounter notes, upcoming appointments, etc.  Non-urgent messages can be sent to your provider as well.   To learn more about what you can do with MyChart, go to [email or web address].    Your next appointment:   12 month(s)  The format for your next appointment:   In Person  Provider:   You may see Quay Burow MD or one of the following Advanced Practice Providers on your designated Care Team:    Kerin Ransom, PA-C  [place], [place]  Coletta Memos, [place]

## 2019-12-22 NOTE — Progress Notes (Signed)
12/22/2019 Kristina [name]   1946/07/27  [id number]  Primary Physician Kathyrn Lass, MD Primary Cardiologist: Lorretta Harp MD FACP, [place], [place], [place]  HPI:  Kristina [name] is a 74 y.o. female with past medical history of carotid artery disease, status post left carotid endarterectomy in December 2020, HTN, HLD, mild AMS, mild MR, who presented to clinic for follow-up of carotid disease.  She mentions that she is doing very well, her visual loss resolved.  Denies any dizziness, headache.  She lives alone and has a puppy and walk her 20 minutes 3 times a day without any difficulty, dyspnea or chest pain.  She endorses that she quit smoking 20 years ago.  Reports compliance to her medications.    Current Meds  Medication Sig  . acetaminophen (TYLENOL) 325 MG tablet Take 1-2 tablets (325-650 mg total) by mouth every 6 (six) hours as needed for mild pain (or temp >/= 101 F).  [name] aspirin EC 81 MG tablet Take 81 mg by mouth daily.  . dorzolamide-timolol (COSOPT) 22.3-6.8 MG/ML ophthalmic solution Place 1 drop into both eyes 2 (two) times daily.  [name] ELDERBERRY PO Take 2 capsules by mouth daily.  [name] escitalopram (LEXAPRO) 10 MG tablet Take 10 mg by mouth daily.  [name] GLUCOSAMINE-CHONDROITIN PO Take 1 tablet by mouth 2 (two) times daily.   . hydrochlorothiazide (HYDRODIURIL) 25 MG tablet Take 25 mg by mouth daily.   [name] losartan (COZAAR) 100 MG tablet Take 100 mg by mouth daily.  . montelukast (SINGULAIR) 10 MG tablet TAKE 1 TABLET(10 MG) BY MOUTH AT BEDTIME (Patient taking differently: Take 10 mg by mouth at bedtime. )  . montelukast (SINGULAIR) 10 MG tablet Take 1 tablet (10 mg total) by mouth at bedtime.  . Multiple Vitamin (MULTIVITAMIN) capsule Take 1 capsule by mouth daily.  . Multiple Vitamins-Minerals (AIRBORNE GUMMIES PO) Take 3 tablets by mouth daily.   . Vitamin D, Ergocalciferol, (DRISDOL) 1.25 MG (50000 UT) CAPS capsule Take 1 capsule (50,000 Units total) by mouth every 7 (seven)  days. (Patient taking differently: Take 50,000 Units by mouth every Monday. )     Allergies  Allergen Reactions  . Penicillins Anaphylaxis and Itching    Did it involve swelling of the face/tongue/throat, SOB, or low BP? Yes Did it involve sudden or severe rash/hives, skin peeling, or any reaction on the inside of your mouth or nose? No Did you need to seek medical attention at a hospital or doctor's office? Yes When did it last happen?Childhood allergy If all above answers are "NO", may proceed with cephalosporin use.     Social History   Socioeconomic History  . Marital status: Single    Spouse name: Not on file  . Number of children: Not on file  . Years of education: Not on file  . Highest education level: Not on file  Occupational History  . 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 Smoking status: Former Smoker    Packs/day: 1.50    Years: 30.00    Pack years: 45.00    Types: Cigarettes    Quit date: 2009    Years since quitting: 12.2  . Smokeless tobacco: Never Used  Substance and Sexual Activity  . Alcohol use: Yes    Comment: social  . Drug use: Never  . Sexual activity: Not on file  Other Topics Concern  . Not on file  Social History Narrative  . Not on file  Social Determinants of Health   Financial Resource Strain:   . Difficulty of Paying Living Expenses:   Food Insecurity:   . Worried About Charity fundraiser in the Last Year:   . Arboriculturist in the Last Year:   Transportation Needs:   . Film/video editor (Medical):   [name] Lack of Transportation (Non-Medical):   Physical Activity:   . Days of Exercise per Week:   . Minutes of Exercise per Session:   Stress:   . Feeling of Stress :   Social Connections:   . Frequency of Communication with Friends and Family:   . Frequency of Social Gatherings with Friends and Family:   . Attends Religious Services:   . Active Member of Clubs or Organizations:   .  Attends Archivist Meetings:   [name] Marital Status:   Intimate Partner Violence:   . Fear of Current or Ex-Partner:   . Emotionally Abused:   [name] Physically Abused:   . Sexually Abused:      Review of Systems: General: negative for chills, fever, night sweats or weight changes.  Cardiovascular: negative for chest pain, dyspnea on exertion, edema, orthopnea, palpitations, paroxysmal nocturnal dyspnea or shortness of breath Dermatological: negative for rash Respiratory: negative for cough or wheezing Urologic: negative for hematuria Abdominal: negative for nausea, vomiting, diarrhea, bright red blood per rectum, melena, or hematemesis Neurologic: negative for visual changes, syncope, or dizziness All other systems reviewed and are otherwise negative except as noted above.    Blood pressure (!) 170/80, pulse 60, height 5\' 3"  (1.6 m), weight 194 lb (88 kg).  Physical Exam Constitutional:      General: She is not in acute distress.    Appearance: She is not ill-appearing.  Neck:     Comments: Surgical scar Cardiovascular:     Rate and Rhythm: Normal rate and regular rhythm.     Pulses: Normal pulses.     Heart sounds: Murmur present.     Comments: Systolic crescendo decrescendo, murmur at right upper sternal border, radiates to neck.  Pulmonary:     Effort: Pulmonary effort is normal.     Breath sounds: Normal breath sounds. No wheezing or rales.  Abdominal:     General: There is no distension.  Musculoskeletal:        General: No swelling or deformity.     Right lower leg: No edema.     Left lower leg: No edema.  Neurological:     Mental Status: She is alert and oriented to person, place, and time.  Psychiatric:        Mood and Affect: Mood normal.        Behavior: Behavior normal.        Thought Content: Thought content normal.        Judgment: Judgment normal.     EKG: Not performed today  ASSESSMENT AND PLAN:   Carotid artery disease (HCC) Status post  left carotid endarterectomy 09/2019. She denies any headache, dizziness, vision change. She reports adherence to aspirin and pravastatin 40 mg.  -Continue aspirin and statin -Repeat carotid doppler in 2-3 weeks  Hyperlipidemia Pravastatin increased from 10 to 40 mg on prior visit given the last LDL of 101 on blood work 12/20.  Patient reports adherence. -Check lipid profile today  Essential hypertension Blood pressure today is elevated at 170/80, with heart rate of 60.  Patient reports history of elevated BP when measured in the office.  She reports  compliance to losartan 100 mg daily and HCTZ 25 mg daily.  She does not check her blood pressure at home regularly.  -Continue losartan 100 mg daily and HCTZ 25 mg daily -Advised to check blood pressure at least twice a day and record the numbers in BP log and provide it to PCP for medication adjustment if needed  Aortic stenosis Mild aortic stenosis with mean gradient of 12.5, Aortic valve peak gradient measures 22.2 mmHg. Aortic valve area, 1.44 cm, per last echo 09/2019.  She denies any dizziness, DOE, presyncope or syncope.   -Repeat echo annually. (Will be due at 09/2020)    Linna Hoff MD. IM PGY-2  12/22/2019 11:06 AM  Agree with note by Dr. Myrtie Hawk  Ms. Dumitrescu initially saw me for evaluation of carotid disease with symptoms of amaurosis fugax.  She did have high-grade left ICA stenosis and underwent endarterectomy soon after she saw me by Dr. Trula Slade.  She has moderate right ICA stenosis.  She also has mild aortic stenosis.  She is recuperated nicely.  She feels clinically improved.  Her vision is normal.  We are going to repeat carotid Doppler studies as a new baseline which she will have every year as well.  She will get a 2D echo to follow her mild aortic stenosis and I will see her back in 12 months.  Lorretta Harp, M.D., [place], [facility], [place] [address] [place], [place]  [address]  [phone] 12/22/2019 12:04 PM

## 2020-01-03 ENCOUNTER — Other Ambulatory Visit: Payer: Self-pay | Admitting: Cardiovascular Disease

## 2020-01-03 ENCOUNTER — Other Ambulatory Visit: Payer: Self-pay

## 2020-01-03 ENCOUNTER — Ambulatory Visit (HOSPITAL_COMMUNITY)
Admission: RE | Admit: 2020-01-03 | Discharge: 2020-01-03 | Disposition: A | Discharge status: Home / Self Care | Payer: Medicare Other | Source: Ambulatory Visit | Attending: Internal Medicine | Admitting: Internal Medicine

## 2020-01-03 DIAGNOSIS — I6523 Occlusion and stenosis of bilateral carotid arteries: Secondary | ICD-10-CM | POA: Diagnosis present

## 2020-01-15 ENCOUNTER — Encounter (INDEPENDENT_AMBULATORY_CARE_PROVIDER_SITE_OTHER): Payer: Medicare Other | Admitting: Ophthalmology

## 2020-01-22 NOTE — Progress Notes (Addendum)
[facility] Note  01/25/2020     CHIEF COMPLAINT Patient presents for Retina Follow Up   HISTORY OF PRESENT ILLNESS: Kristina [name] is a 74 y.o. female who presents to the clinic today for:   HPI    Retina Follow Up    Patient presents with  Other.  In left eye.  This started 3 months ago.  Severity is moderate.  I, the attending physician,  performed the HPI with the patient and updated documentation appropriately.          Comments    Patient here for 3 months retina follow up for transient vision loss OS. Patient states vision doing good. Everything been fine since surgery. No eye pain.       Last edited by Bernarda Caffey, MD on 01/25/2020 10:13 AM. (History)    pt states her vision has gotten better since she had sx, pts blood pressure has been under control, pt is on a baby aspirin daily, pt has been seen at [facility] for establishment of care and reports she is happy with the change in drs (previously seen by [facility]), pt states Dr. Katy Fitch put her on Cosopt BID OU.  Referring physician: Kathyrn Lass, MD [place],  [place] [id number]  HISTORICAL INFORMATION:   Selected notes from the MEDICAL RECORD NUMBER Referred by Dr. Parke Simmers for eval of transient loss of vision OS.   CURRENT MEDICATIONS: Current Outpatient Medications (Ophthalmic Drugs)  Medication Sig  . dorzolamide-timolol (COSOPT) 22.3-6.8 MG/ML ophthalmic solution Place 1 drop into both eyes 2 (two) times daily.   No current facility-administered medications for this visit. (Ophthalmic Drugs)   Current Outpatient Medications (Other)  Medication Sig  . acetaminophen (TYLENOL) 325 MG tablet Take 1-2 tablets (325-650 mg total) by mouth every 6 (six) hours as needed for mild pain (or temp >/= 101 F).  [name] aspirin EC 81 MG tablet Take 81 mg by mouth daily.  [name] ELDERBERRY PO Take 2 capsules by mouth daily.  [name] escitalopram (LEXAPRO) 10 MG tablet Take 10 mg by  mouth daily.  [name] GLUCOSAMINE-CHONDROITIN PO Take 1 tablet by mouth 2 (two) times daily.   . hydrochlorothiazide (HYDRODIURIL) 25 MG tablet Take 25 mg by mouth daily.   [name] losartan (COZAAR) 100 MG tablet Take 100 mg by mouth daily.  . montelukast (SINGULAIR) 10 MG tablet TAKE 1 TABLET(10 MG) BY MOUTH AT BEDTIME (Patient taking differently: Take 10 mg by mouth at bedtime. )  . montelukast (SINGULAIR) 10 MG tablet Take 1 tablet (10 mg total) by mouth at bedtime.  . Multiple Vitamin (MULTIVITAMIN) capsule Take 1 capsule by mouth daily.  . Multiple Vitamins-Minerals (AIRBORNE GUMMIES PO) Take 3 tablets by mouth daily.   . pravastatin (PRAVACHOL) 40 MG tablet Take 1 tablet (40 mg total) by mouth every evening. (Patient taking differently: Take 40 mg by mouth daily. )  . Vitamin D, Ergocalciferol, (DRISDOL) 1.25 MG (50000 UT) CAPS capsule Take 1 capsule (50,000 Units total) by mouth every 7 (seven) days. (Patient taking differently: Take 50,000 Units by mouth every Monday. )   No current facility-administered medications for this visit. (Other)      REVIEW OF SYSTEMS: ROS    Positive for: Eyes   Negative for: Constitutional, Gastrointestinal, Neurological, Skin, Genitourinary, Musculoskeletal, HENT, Endocrine, Cardiovascular, Respiratory, Psychiatric, Allergic/Imm, Heme/Lymph   Last edited by Theodore Demark, COA on 01/25/2020  8:56 AM. (History)  ALLERGIES Allergies  Allergen Reactions  . Penicillins Anaphylaxis and Itching    Did it involve swelling of the face/tongue/throat, SOB, or low BP? Yes Did it involve sudden or severe rash/hives, skin peeling, or any reaction on the inside of your mouth or nose? No Did you need to seek medical attention at a hospital or doctor's office? Yes When did it last happen?Childhood allergy If all above answers are "NO", may proceed with cephalosporin use.     PAST MEDICAL HISTORY Past Medical History:  Diagnosis Date  . Allergic rhinitis    . Anemia glaucoma  . Anxiety   . Back pain   . DDD (degenerative disc disease), lumbar   . High blood pressure   . High cholesterol   . History of stomach ulcers   . Rheumatic fever   . Rheumatoid arteritis ([place])    Past Surgical History:  Procedure Laterality Date  . APPENDECTOMY  1955  . CHOLECYSTECTOMY  1963  . ENDARTERECTOMY Left 09/27/2019   Procedure: ENDARTERECTOMY CAROTID LEFT;  Surgeon: Serafina Mitchell, MD;  Location: [facility];  Service: Vascular;  Laterality: Left;  . HERNIA REPAIR  2007  . HERNIA REPAIR  2015  . PATCH ANGIOPLASTY Left 09/27/2019   Procedure: Patch Angioplasty Using Rueben Bash;  Surgeon: Serafina Mitchell, MD;  Location: [facility];  Service: Vascular;  Laterality: Left;  . REPLACEMENT TOTAL KNEE  2016  . TONSILLECTOMY      FAMILY HISTORY Family History  Problem Relation Age of Onset  . Kidney disease Mother   . Hypertension Mother   . Glaucoma Mother   . Emphysema Father   . Asthma Father   . Hypertension Father   . Heart disease Father   . COPD Brother   . Allergic rhinitis Neg Hx   . Angioedema Neg Hx   . Atopy Neg Hx   . Eczema Neg Hx   . Immunodeficiency Neg Hx   . Urticaria Neg Hx     SOCIAL HISTORY Social History   Tobacco Use  . Smoking status: Former Smoker    Packs/day: 1.50    Years: 30.00    Pack years: 45.00    Types: Cigarettes    Quit date: 2009    Years since quitting: 12.3  . Smokeless tobacco: Never Used  Substance Use Topics  . Alcohol use: Yes    Comment: social  . Drug use: Never         OPHTHALMIC EXAM:  Base Eye Exam    Visual Acuity (Snellen - Linear)      Right Left   Dist cc 20/30 -2 20/30 -1   Dist ph cc 20/25 20/25 +1   Correction: Glasses       Tonometry (Tonopen, 8:52 AM)      Right Left   Pressure 13 15       Pupils      Dark Light Shape React APD   Right 4 3 Round Brisk None   Left 4 3 Round Brisk None       Visual Fields (Counting fingers)      Left Right    Full         Extraocular Movement      Right Left    Full, Ortho Full, Ortho       Neuro/Psych    Oriented x3: Yes   Mood/Affect: Normal       Dilation    Both eyes: 1.0% Mydriacyl, 2.5% Phenylephrine @ 8:52 AM  Slit Lamp and Fundus Exam    Slit Lamp Exam      Right Left   Lids/Lashes Dermatochalasis - upper lid, Meibomian gland dysfunction, mild Telangiectasia Dermatochalasis - upper lid, Meibomian gland dysfunction, mild Telangiectasia   Conjunctiva/Sclera White and quiet Mild conj cysts at 0400, otherwise white and quiet   Cornea mild EBMD, 1+ Punctate epithelial erosions, Arcus mild EBMD, 1+ Punctate epithelial erosions, Arcus   Anterior Chamber Deep and quiet Deep and quiet   Iris Round and dilated Round and dilated   Lens 2-3+ Nuclear sclerosis with mild brunescence, 2+ Cortical cataract 2-3+ Nuclear sclerosis with mild brunescence, 2+ Cortical cataract   Vitreous Mild Vitreous syneresis, Posterior vitreous detachment Mild Vitreous syneresis       Fundus Exam      Right Left   Disc Pink and Sharp, narrow inferior rim, +cupping Pink and Sharp, +cupping, focal temporal PPP   C/D Ratio 0.7 0.7   Macula Flat, Blunted foveal reflex, mild Retinal pigment epithelial mottling and clumping, No heme or edema Flat, Blunted foveal reflex, mild Retinal pigment epithelial mottling and clumping, No heme or edema   Vessels Mild Vascular attenuation, AV crossing changes, Tortuousity Vascular attenuation, Tortuous, AV crossing changes   Periphery Attached, no heme  Attached, no heme        Refraction    Wearing Rx      Sphere Cylinder Axis   Right -0.50 +1.00 013   Left [place] +1.25 157          IMAGING AND PROCEDURES  Imaging and Procedures for @TODAY @  OCT, Retina - OU - Both Eyes       Right Eye Quality was good. Central Foveal Thickness: 260. Progression has been stable. Findings include normal foveal contour, no IRF, no SRF.   Left Eye Quality was good. Central Foveal  Thickness: 261. Progression has been stable. Findings include normal foveal contour, no IRF, no SRF.   Notes *Images captured and stored on drive  Diagnosis / Impression:  NFP, no IRF/SRF OU  Clinical management:  See below  Abbreviations: NFP - Normal foveal profile. CME - cystoid macular edema. PED - pigment epithelial detachment. IRF - intraretinal fluid. SRF - subretinal fluid. EZ - ellipsoid zone. ERM - epiretinal membrane. ORA - outer retinal atrophy. ORT - outer retinal tubulation. SRHM - subretinal hyper-reflective material        Fluorescein Angiography Optos (Transit OS)       Right Eye   Progression has been stable. Early phase findings include normal observations. Mid/Late phase findings include normal observations.   Left Eye   Progression has been stable. Early phase findings include delayed filling. Mid/Late phase findings include normal observations.   Notes **Images stored on drive**  Impression: OD: normal study OS: slightly delayed filling time -- no significant change from prior                ASSESSMENT/PLAN:    ICD-10-CM   1. Transient vision disturbance of left eye  H53.9   2. 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 Retinal edema  H35.81 OCT, Retina - OU - Both Eyes  5. Essential hypertension  I10   6. Hypertensive retinopathy of both eyes  H35.033 Fluorescein Angiography Optos (Transit OS)  7. Glaucoma suspect of both eyes  H40.003   8. Combined forms of age-related cataract of both eyes  H25.813     1-3. Transient Vision  Loss OS secondary to ocular ischemic syndrome and L carotid stenosis  - pt reports episodes of "graying out" of vision described as curtain coming from below -- initial onset 3-4 mos ago; variable duration and intensity/degree of vision loss; duration on the order of minutes; frequency is multiple per week, with increased frequency of episodes over the week prior to last  visit.  - dilated eye exam fairly benign -- mild hypertensive changes in vasculature; no hemorrhage  - FA (12.01.20) showed mildly delayed filling time OS -- otherwise normal study OU without leakage or vascular perfusion defects  - concern for vascular etiology / transient retinal ischemia  - case discussed with PCP, Dr. Sabra Heck, who arranged for cardiology consult and carotid ultrasound and echocardiogram  - carotid ultrasound on 12.18.2020 showed 80-99% stenosis of left ICA and pt underwent urgent CEA on 12.23.2020 w/ Dr. Trula Slade  - pt now reports resolution of symptoms -- no repeat episodes of transient vision loss  - repeat FA 4.22.21 shows persistent mildly delayed filling time OS -- no significant change from prior  - eye exam stable and BCVA OS 20/25   - f/u 1 year, sooner prn -- DFE, OCT  4. No retinal edema on exam or OCT  5,6. Hypertensive retinopathy OU  - discussed importance of tight BP control  - monitor  7. 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 Chauncey Cruel. Katy Fitch  8. 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  - discussed diagnosis and progression  - now under the expert management of Dr. Zenia Resides   Ophthalmic Meds Ordered this visit:  No orders of the defined types were placed in this encounter.      Return in about 1 year (around 01/24/2021) for ocular ischemic syndrome OS, DFE, OCT.  There are no Patient Instructions on file for this visit.   Explained the diagnoses, plan, and follow up with the patient and they expressed understanding.  Patient expressed understanding of the importance of proper follow up care.   This document serves as a record of services personally performed by Gardiner Sleeper, MD, PhD. It was created on their behalf by Leeann Must, [place], a certified ophthalmic assistant. The creation of  this record is the provider's dictation and/or activities during the visit.    Electronically signed by: Leeann Must, COA @TODAY @ 3:00 PM   This document serves as a record of services personally performed by Gardiner Sleeper, MD, PhD. It was created on their behalf by Ernest Mallick, OA, an ophthalmic assistant. The creation of this record is the provider's dictation and/or activities during the visit.    Electronically signed by: Ernest Mallick, OA 04.22.2021 3:00 PM   Gardiner Sleeper, M.D., Ph.D. Diseases & Surgery of the Retina and Vitreous [facility]  I have reviewed the above documentation for accuracy and completeness, and I agree with the above. Gardiner Sleeper, M.D., Ph.D. 01/25/20 3:00 PM    Abbreviations: M myopia (nearsighted); A astigmatism; H hyperopia (farsighted); P presbyopia; Mrx spectacle prescription;  CTL contact lenses; OD right eye; OS left eye; OU both eyes  XT exotropia; ET esotropia; PEK punctate epithelial keratitis; PEE punctate epithelial erosions; DES dry eye syndrome; MGD meibomian gland dysfunction; ATs artificial tears; PFAT's preservative free artificial tears; Holcomb nuclear sclerotic cataract; [place]  posterior subcapsular cataract; ERM epi-retinal membrane; PVD posterior vitreous detachment; RD retinal detachment; DM diabetes mellitus; DR diabetic retinopathy; NPDR non-proliferative diabetic retinopathy; PDR proliferative diabetic retinopathy; CSME clinically significant macular edema; DME diabetic macular edema; dbh dot blot hemorrhages; CWS cotton wool spot; POAG primary open angle glaucoma; C/D cup-to-disc ratio; HVF humphrey visual field; GVF goldmann visual field; OCT optical coherence tomography; IOP intraocular pressure; BRVO Branch retinal vein occlusion; CRVO central retinal vein occlusion; CRAO central retinal artery occlusion; BRAO branch retinal artery occlusion; RT retinal tear; SB scleral buckle; PPV pars plana vitrectomy; VH  Vitreous hemorrhage; PRP panretinal laser photocoagulation; IVK intravitreal kenalog; VMT vitreomacular traction; MH Macular hole;  NVD neovascularization of the disc; NVE neovascularization elsewhere; AREDS age related eye disease study; ARMD age related macular degeneration; POAG primary open angle glaucoma; EBMD epithelial/anterior basement membrane dystrophy; ACIOL anterior chamber intraocular lens; IOL intraocular lens; PCIOL posterior chamber intraocular lens; Phaco/IOL phacoemulsification with intraocular lens placement; Brooks photorefractive keratectomy; LASIK laser assisted in situ keratomileusis; HTN hypertension; DM diabetes mellitus; COPD chronic obstructive pulmonary disease

## 2020-01-25 ENCOUNTER — Ambulatory Visit (INDEPENDENT_AMBULATORY_CARE_PROVIDER_SITE_OTHER): Payer: Medicare Other | Admitting: Ophthalmology

## 2020-01-25 ENCOUNTER — Encounter (INDEPENDENT_AMBULATORY_CARE_PROVIDER_SITE_OTHER): Payer: Self-pay | Admitting: Ophthalmology

## 2020-01-25 DIAGNOSIS — H35033 Hypertensive retinopathy, bilateral: Secondary | ICD-10-CM

## 2020-01-25 DIAGNOSIS — H539 Unspecified visual disturbance: Secondary | ICD-10-CM

## 2020-01-25 DIAGNOSIS — H3582 Retinal ischemia: Secondary | ICD-10-CM | POA: Diagnosis not present

## 2020-01-25 DIAGNOSIS — H3581 Retinal edema: Secondary | ICD-10-CM | POA: Diagnosis not present

## 2020-01-25 DIAGNOSIS — H25813 Combined forms of age-related cataract, bilateral: Secondary | ICD-10-CM

## 2020-01-25 DIAGNOSIS — I6522 Occlusion and stenosis of left carotid artery: Secondary | ICD-10-CM | POA: Diagnosis not present

## 2020-01-25 DIAGNOSIS — H40003 Preglaucoma, unspecified, bilateral: Secondary | ICD-10-CM

## 2020-01-25 DIAGNOSIS — I10 Essential (primary) hypertension: Secondary | ICD-10-CM

## 2020-03-07 ENCOUNTER — Other Ambulatory Visit: Payer: Self-pay | Admitting: Allergy and Immunology

## 2020-08-30 ENCOUNTER — Other Ambulatory Visit: Payer: Self-pay | Admitting: Allergy and Immunology

## 2020-09-09 ENCOUNTER — Other Ambulatory Visit: Payer: Self-pay

## 2020-09-09 ENCOUNTER — Ambulatory Visit (HOSPITAL_COMMUNITY): Payer: Medicare Other | Attending: Cardiology

## 2020-09-09 DIAGNOSIS — I350 Nonrheumatic aortic (valve) stenosis: Secondary | ICD-10-CM | POA: Diagnosis present

## 2020-09-09 LAB — ECHOCARDIOGRAM COMPLETE
AR max vel: 1.21 cm2
AV Area VTI: 1.18 cm2
AV Area mean vel: 1.1 cm2
AV Mean grad: 14 mmHg
AV Peak grad: 29.2 mmHg
Ao pk vel: 2.7 m/s
Area-P 1/2: 3.17 cm2
S' Lateral: 3.3 cm

## 2020-09-11 ENCOUNTER — Other Ambulatory Visit: Payer: Self-pay | Admitting: *Deleted

## 2020-09-11 DIAGNOSIS — I6523 Occlusion and stenosis of bilateral carotid arteries: Secondary | ICD-10-CM

## 2020-09-12 ENCOUNTER — Ambulatory Visit (INDEPENDENT_AMBULATORY_CARE_PROVIDER_SITE_OTHER): Payer: Medicare Other | Admitting: Physician Assistant

## 2020-09-12 ENCOUNTER — Encounter: Payer: Self-pay | Admitting: Physician Assistant

## 2020-09-12 ENCOUNTER — Ambulatory Visit (HOSPITAL_COMMUNITY)
Admission: RE | Admit: 2020-09-12 | Discharge: 2020-09-12 | Disposition: A | Discharge status: Home / Self Care | Payer: Medicare Other | Source: Ambulatory Visit | Attending: Surgery | Admitting: Surgery

## 2020-09-12 ENCOUNTER — Other Ambulatory Visit: Payer: Self-pay

## 2020-09-12 VITALS — BP 120/58 | HR 50 | Temp 98.2°F | Resp 20 | Ht 63.0 in | Wt 209.2 lb

## 2020-09-12 DIAGNOSIS — I6523 Occlusion and stenosis of bilateral carotid arteries: Secondary | ICD-10-CM

## 2020-09-12 NOTE — Progress Notes (Signed)
History of Present Illness:  Patient is a 74 y.o. year old female who presents for evaluation of carotid stenosis.  She presented  with left eye amaurosis.  Carotid duplex indicated greater than 80% stenosis.  On 09/27/2019 she underwent left carotid endarterectomy with patch angioplasty.   The patient denies symptoms of TIA, amaurosis, or stroke.  The patient is currently on ASA antiplatelet therapy.   Past Medical History:  Diagnosis Date  . Allergic rhinitis   . Anemia glaucoma  . Anxiety   . Back pain   . DDD (degenerative disc disease), lumbar   . High blood pressure   . High cholesterol   . History of stomach ulcers   . Rheumatic fever   . Rheumatoid arteritis ([place])     Past Surgical History:  Procedure Laterality Date  . APPENDECTOMY  1955  . CHOLECYSTECTOMY  1963  . ENDARTERECTOMY Left 09/27/2019   Procedure: ENDARTERECTOMY CAROTID LEFT;  Surgeon: Serafina Mitchell, MD;  Location: [facility];  Service: Vascular;  Laterality: Left;  . HERNIA REPAIR  2007  . HERNIA REPAIR  2015  . PATCH ANGIOPLASTY Left 09/27/2019   Procedure: Patch Angioplasty Using Rueben Bash;  Surgeon: Serafina Mitchell, MD;  Location: [facility];  Service: Vascular;  Laterality: Left;  . REPLACEMENT TOTAL KNEE  2016  . TONSILLECTOMY       Social History Social History   Tobacco Use  . Smoking status: Former Smoker    Packs/day: 1.50    Years: 30.00    Pack years: 45.00    Types: Cigarettes    Quit date: 2009    Years since quitting: 12.9  . Smokeless tobacco: Never Used  Vaping Use  . Vaping Use: Never used  Substance Use Topics  . Alcohol use: Yes    Comment: social  . Drug use: Never    Family History Family History  Problem Relation Age of Onset  . Kidney disease Mother   . Hypertension Mother   . Glaucoma Mother   . Emphysema Father   . Asthma Father   . Hypertension Father   . Heart disease Father   . COPD Brother   . Allergic rhinitis Neg Hx   . Angioedema Neg Hx   . Atopy  Neg Hx   . Eczema Neg Hx   . Immunodeficiency Neg Hx   . Urticaria Neg Hx     Allergies  Allergies  Allergen Reactions  . Penicillins Anaphylaxis and Itching    Did it involve swelling of the face/tongue/throat, SOB, or low BP? Yes Did it involve sudden or severe rash/hives, skin peeling, or any reaction on the inside of your mouth or nose? No Did you need to seek medical attention at a hospital or doctor's office? Yes When did it last happen?Childhood allergy If all above answers are "NO", may proceed with cephalosporin use.      Current Outpatient Medications  Medication Sig Dispense Refill  . acetaminophen (TYLENOL) 325 MG tablet Take 1-2 tablets (325-650 mg total) by mouth every 6 (six) hours as needed for mild pain (or temp >/= 101 F).    [name] aspirin EC 81 MG tablet Take 81 mg by mouth daily.    . dorzolamide-timolol (COSOPT) 22.3-6.8 MG/ML ophthalmic solution Place 1 drop into both eyes 2 (two) times daily. 10 mL 4  . ELDERBERRY PO Take 2 capsules by mouth daily.    [name] escitalopram (LEXAPRO) 10 MG tablet Take 10 mg by mouth daily.    [name]  GLUCOSAMINE-CHONDROITIN PO Take 1 tablet by mouth 2 (two) times daily.     . hydrochlorothiazide (HYDRODIURIL) 25 MG tablet Take 25 mg by mouth daily.     [name] losartan (COZAAR) 100 MG tablet Take 100 mg by mouth daily.    . montelukast (SINGULAIR) 10 MG tablet TAKE 1 TABLET(10 MG) BY MOUTH AT BEDTIME (Patient taking differently: Take 10 mg by mouth at bedtime.) 30 tablet 0  . Multiple Vitamin (MULTIVITAMIN) capsule Take 1 capsule by mouth daily.    . Multiple Vitamins-Minerals (AIRBORNE GUMMIES PO) Take 3 tablets by mouth daily.     . Vitamin D, Ergocalciferol, (DRISDOL) 1.25 MG (50000 UT) CAPS capsule Take 1 capsule (50,000 Units total) by mouth every 7 (seven) days. (Patient taking differently: Take 50,000 Units by mouth every Monday.) 4 capsule 0  . pravastatin (PRAVACHOL) 40 MG tablet Take 1 tablet (40 mg total) by mouth every evening.  (Patient taking differently: Take 40 mg by mouth daily. ) 90 tablet 3   No current facility-administered medications for this visit.    ROS:   General:  No weight loss, Fever, chills  HEENT: No recent headaches, no nasal bleeding, no visual changes, no sore throat  Neurologic: No dizziness, blackouts, seizures. No recent symptoms of stroke or mini- stroke. No recent episodes of slurred speech, or temporary blindness.  Cardiac: No recent episodes of chest pain/pressure, no shortness of breath at rest.  No shortness of breath with exertion.  Denies history of atrial fibrillation or irregular heartbeat  Vascular: No history of rest pain in feet.  No history of claudication.  No history of non-healing ulcer, No history of DVT   Pulmonary: No home oxygen, no productive cough, no hemoptysis,  No asthma or wheezing  Musculoskeletal:  [ ]  Arthritis, [ ]  Low back pain,  [ ]  Joint pain  Hematologic:No history of hypercoagulable state.  No history of easy bleeding.  No history of anemia  Gastrointestinal: No hematochezia or melena,  No gastroesophageal reflux, no trouble swallowing  Urinary: [ ]  chronic Kidney disease, [ ]  on HD - [ ]  MWF or [ ]  TTHS, [ ]  Burning with urination, [ ]  Frequent urination, [ ]  Difficulty urinating;   Skin: No rashes  Psychological: No history of anxiety,  No history of depression   Physical Examination  Vitals:   09/12/20 0844 09/12/20 0847  BP: (!) 146/65 (!) 120/58  Pulse: (!) 50   Resp: 20   Temp: 98.2 F (36.8 C)   TempSrc: Temporal   SpO2: 97%   Weight: 209 lb 3.2 oz (94.9 kg)   Height: 5\' 3"  (1.6 m)     Body mass index is 37.06 kg/m.  General:  Alert and oriented, no acute distress HEENT: Normal Neck: No bruit or JVD Pulmonary: Clear to auscultation bilaterally Cardiac: Regular Rate and Rhythm without murmur Gastrointestinal: Soft, non-tender, non-distended, no mass, no scars Skin: No rash Extremity Pulses:  2+ radial, brachial,  femoral, dorsalis pedis, posterior tibial pulses bilaterally Musculoskeletal: No deformity or edema  Neurologic: Upper and lower extremity motor 5/5 and symmetric  DATA:    Right Carotid Findings:  +----------+--------+--------+--------+-------------------------+--------+       PSV cm/sEDV cm/sStenosisPlaque Description    Comments  +----------+--------+--------+--------+-------------------------+--------+  CCA Prox 75                              +----------+--------+--------+--------+-------------------------+--------+  CCA Mid  64   16  homogeneous             +----------+--------+--------+--------+-------------------------+--------+  CCA Distal55   14       calcific and heterogenous      +----------+--------+--------+--------+-------------------------+--------+  ICA Prox 106   20   1-39%  calcific               +----------+--------+--------+--------+-------------------------+--------+  ICA Mid  114   27                          +----------+--------+--------+--------+-------------------------+--------+  ICA Distal137   37                          +----------+--------+--------+--------+-------------------------+--------+  ECA    565   66   >50%  calcific               +----------+--------+--------+--------+-------------------------+--------+   +----------+--------+-------+----------------+-------------------+       PSV cm/sEDV cmsDescribe    Arm Pressure (mmHG)  +----------+--------+-------+----------------+-------------------+  [id number]       Multiphasic, WNL            +----------+--------+-------+----------------+-------------------+   +---------+--------+--+--------+--+---------+  VertebralPSV cm/s82EDV  cm/s13Antegrade  +---------+--------+--+--------+--+---------+      Left Carotid Findings:  +----------+--------+--------+--------+------------------+--------+       PSV cm/sEDV cm/sStenosisPlaque DescriptionComments  +----------+--------+--------+--------+------------------+--------+  CCA Prox 107   22                      +----------+--------+--------+--------+------------------+--------+  CCA Mid  82   23                      +----------+--------+--------+--------+------------------+--------+  CCA Distal85   20       homogeneous          +----------+--------+--------+--------+------------------+--------+  ICA Prox 125   40   40-59%                +----------+--------+--------+--------+------------------+--------+  ICA Mid  141   34       homogeneous          +----------+--------+--------+--------+------------------+--------+  ICA Distal153   32                      +----------+--------+--------+--------+------------------+--------+  ECA    178   18       homogeneous          +----------+--------+--------+--------+------------------+--------+   +----------+--------+--------+----------------+-------------------+       PSV cm/sEDV cm/sDescribe    Arm Pressure (mmHG)  +----------+--------+--------+----------------+-------------------+  [id number]       Multiphasic, WNL            +----------+--------+--------+----------------+-------------------+   +---------+--------+--+--------+--+---------+  VertebralPSV cm/s62EDV cm/s12Antegrade  +---------+--------+--+--------+--+---------+        Summary:  Right Carotid: Velocities in the right ICA are consistent with a 1-39%  stenosis.   Left Carotid: Velocities in the left ICA are  consistent with a 40-59%  stenosis.   Vertebrals: Bilateral vertebral arteries demonstrate antegrade flow.  Subclavians: Normal flow hemodynamics were seen in bilateral subclavian        arteries.   ASSESSMENT:  B ICA stenosis S/P symptomatic left ICA critical stenosis Carotid duplex demonstrates right ICA < 39% and left ICA 40-59%.  Velocities demonstrates closer to the 40% stenosis with velocities of 153 and less.   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    PLAN: We reviewed signs and symptoms of stroke and TIA. If these occur she will call  [phone].  Otherwise she will f/u in 1 year for repeat carotid duplex.   Continue daily asa and Statin.   Roxy Horseman PA-C  [facility] Office: [phone]  MD in clinic [place]

## 2020-09-22 ENCOUNTER — Other Ambulatory Visit (INDEPENDENT_AMBULATORY_CARE_PROVIDER_SITE_OTHER): Payer: Self-pay | Admitting: Ophthalmology

## 2020-11-26 DIAGNOSIS — B351 Tinea unguium: Secondary | ICD-10-CM | POA: Diagnosis not present

## 2020-11-26 DIAGNOSIS — E7800 Pure hypercholesterolemia, unspecified: Secondary | ICD-10-CM | POA: Diagnosis not present

## 2020-11-26 DIAGNOSIS — I739 Peripheral vascular disease, unspecified: Secondary | ICD-10-CM | POA: Diagnosis not present

## 2020-11-26 DIAGNOSIS — I10 Essential (primary) hypertension: Secondary | ICD-10-CM | POA: Diagnosis not present

## 2020-12-16 DIAGNOSIS — E7800 Pure hypercholesterolemia, unspecified: Secondary | ICD-10-CM | POA: Diagnosis not present

## 2020-12-16 DIAGNOSIS — M158 Other polyosteoarthritis: Secondary | ICD-10-CM | POA: Diagnosis not present

## 2020-12-16 DIAGNOSIS — I10 Essential (primary) hypertension: Secondary | ICD-10-CM | POA: Diagnosis not present

## 2020-12-23 DIAGNOSIS — L821 Other seborrheic keratosis: Secondary | ICD-10-CM | POA: Diagnosis not present

## 2020-12-23 DIAGNOSIS — H2513 Age-related nuclear cataract, bilateral: Secondary | ICD-10-CM | POA: Diagnosis not present

## 2020-12-23 DIAGNOSIS — B078 Other viral warts: Secondary | ICD-10-CM | POA: Diagnosis not present

## 2020-12-23 DIAGNOSIS — D485 Neoplasm of uncertain behavior of skin: Secondary | ICD-10-CM | POA: Diagnosis not present

## 2020-12-23 DIAGNOSIS — H40053 Ocular hypertension, bilateral: Secondary | ICD-10-CM | POA: Diagnosis not present

## 2020-12-23 DIAGNOSIS — L570 Actinic keratosis: Secondary | ICD-10-CM | POA: Diagnosis not present

## 2020-12-23 DIAGNOSIS — H02831 Dermatochalasis of right upper eyelid: Secondary | ICD-10-CM | POA: Diagnosis not present

## 2020-12-23 DIAGNOSIS — H02834 Dermatochalasis of left upper eyelid: Secondary | ICD-10-CM | POA: Diagnosis not present

## 2020-12-23 DIAGNOSIS — D1801 Hemangioma of skin and subcutaneous tissue: Secondary | ICD-10-CM | POA: Diagnosis not present

## 2020-12-23 DIAGNOSIS — H3582 Retinal ischemia: Secondary | ICD-10-CM | POA: Diagnosis not present

## 2020-12-23 DIAGNOSIS — L720 Epidermal cyst: Secondary | ICD-10-CM | POA: Diagnosis not present

## 2020-12-23 DIAGNOSIS — H35033 Hypertensive retinopathy, bilateral: Secondary | ICD-10-CM | POA: Diagnosis not present

## 2020-12-23 DIAGNOSIS — L578 Other skin changes due to chronic exposure to nonionizing radiation: Secondary | ICD-10-CM | POA: Diagnosis not present

## 2020-12-23 DIAGNOSIS — L700 Acne vulgaris: Secondary | ICD-10-CM | POA: Diagnosis not present

## 2020-12-23 DIAGNOSIS — H3581 Retinal edema: Secondary | ICD-10-CM | POA: Diagnosis not present

## 2020-12-26 ENCOUNTER — Telehealth: Payer: Self-pay | Admitting: Cardiovascular Disease

## 2020-12-26 NOTE — Telephone Encounter (Signed)
3.24.22 LVM on mobile phone to schedule 1 yr fu w/Dr Gwenlyn Found. LP

## 2021-01-01 ENCOUNTER — Other Ambulatory Visit: Payer: Self-pay | Admitting: *Deleted

## 2021-01-01 DIAGNOSIS — I739 Peripheral vascular disease, unspecified: Secondary | ICD-10-CM

## 2021-01-01 DIAGNOSIS — I6523 Occlusion and stenosis of bilateral carotid arteries: Secondary | ICD-10-CM

## 2021-01-10 ENCOUNTER — Ambulatory Visit (INDEPENDENT_AMBULATORY_CARE_PROVIDER_SITE_OTHER)
Admission: RE | Admit: 2021-01-10 | Discharge: 2021-01-10 | Disposition: A | Discharge status: Home / Self Care | Payer: Medicare Other | Source: Ambulatory Visit | Attending: Vascular Surgery | Admitting: Vascular Surgery

## 2021-01-10 ENCOUNTER — Ambulatory Visit: Payer: Medicare Other | Admitting: Physician Assistant

## 2021-01-10 ENCOUNTER — Ambulatory Visit (HOSPITAL_COMMUNITY)
Admission: RE | Admit: 2021-01-10 | Discharge: 2021-01-10 | Disposition: A | Discharge status: Home / Self Care | Payer: Medicare Other | Source: Ambulatory Visit | Attending: Vascular Surgery | Admitting: Vascular Surgery

## 2021-01-10 ENCOUNTER — Other Ambulatory Visit: Payer: Self-pay

## 2021-01-10 VITALS — BP 129/62 | HR 49 | Temp 98.3°F | Resp 20 | Ht 63.0 in | Wt 224.2 lb

## 2021-01-10 DIAGNOSIS — M159 Polyosteoarthritis, unspecified: Secondary | ICD-10-CM | POA: Insufficient documentation

## 2021-01-10 DIAGNOSIS — F419 Anxiety disorder, unspecified: Secondary | ICD-10-CM | POA: Insufficient documentation

## 2021-01-10 DIAGNOSIS — E7800 Pure hypercholesterolemia, unspecified: Secondary | ICD-10-CM | POA: Insufficient documentation

## 2021-01-10 DIAGNOSIS — I6523 Occlusion and stenosis of bilateral carotid arteries: Secondary | ICD-10-CM

## 2021-01-10 DIAGNOSIS — Z8673 Personal history of transient ischemic attack (TIA), and cerebral infarction without residual deficits: Secondary | ICD-10-CM | POA: Insufficient documentation

## 2021-01-10 DIAGNOSIS — H547 Unspecified visual loss: Secondary | ICD-10-CM | POA: Insufficient documentation

## 2021-01-10 DIAGNOSIS — I739 Peripheral vascular disease, unspecified: Secondary | ICD-10-CM | POA: Diagnosis not present

## 2021-01-10 DIAGNOSIS — R6889 Other general symptoms and signs: Secondary | ICD-10-CM

## 2021-01-10 DIAGNOSIS — R32 Unspecified urinary incontinence: Secondary | ICD-10-CM | POA: Insufficient documentation

## 2021-01-10 DIAGNOSIS — E559 Vitamin D deficiency, unspecified: Secondary | ICD-10-CM | POA: Insufficient documentation

## 2021-01-10 DIAGNOSIS — D563 Thalassemia minor: Secondary | ICD-10-CM | POA: Insufficient documentation

## 2021-01-10 DIAGNOSIS — Z9109 Other allergy status, other than to drugs and biological substances: Secondary | ICD-10-CM | POA: Insufficient documentation

## 2021-01-10 NOTE — Progress Notes (Signed)
Office Note     CC:  follow up Requesting Provider:  Kathyrn Lass, MD  HPI: Kristina [name] is a 75 y.o. (1946-02-23) female who presents for evaluation of peripheral artery disease. She had an abnormal screening ABI. She reports no claudication symptoms, rest pain or non healing wounds. She explains that she walks her dog anywhere from 30-45 minutes a day and has now lower extremity discomfort.    We additionally see her for carotid artery stenosis. On 09/27/2019 she underwent left carotid endarterectomy with patch angioplasty. The patient denies symptoms of TIA/stroke since her surgery. She had previously had amaurosis fugax and has had no recurrence of this. She denies any visual changes, slurred speech, facial drooping or weakness of upper or lower extremities. She is currently on Aspirin and statin.  She does have risk factors for arterial disease including hypertension, hyperlipidemia, and former tobacco use.   The pt is on a statin for cholesterol management.  The pt  ison a daily aspirin.   Other AC: none The pt is on HCTZ and ARB for hypertension.   The pt is not diabetic.  Tobacco hx:  Former, quit 2009  Past Medical History:  Diagnosis Date  . Allergic rhinitis   . Anemia glaucoma  . Anxiety   . Back pain   . DDD (degenerative disc disease), lumbar   . High blood pressure   . High cholesterol   . History of stomach ulcers   . Rheumatic fever   . Rheumatoid arteritis ([place])     Past Surgical History:  Procedure Laterality Date  . APPENDECTOMY  1955  . CHOLECYSTECTOMY  1963  . ENDARTERECTOMY Left 09/27/2019   Procedure: ENDARTERECTOMY CAROTID LEFT;  Surgeon: Serafina Mitchell, MD;  Location: [place];  Service: Vascular;  Laterality: Left;  . HERNIA REPAIR  2007  . HERNIA REPAIR  2015  . PATCH ANGIOPLASTY Left 09/27/2019   Procedure: Patch Angioplasty Using Rueben Bash;  Surgeon: Serafina Mitchell, MD;  Location: [place];  Service: Vascular;  Laterality: Left;  .  REPLACEMENT TOTAL KNEE  2016  . TONSILLECTOMY      Social History   Socioeconomic History  . Marital status: Single    Spouse name: Not on file  . Number of children: Not on file  . Years of education: Not on file  . Highest education level: Not on file  Occupational History  . 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 Smoking status: Former Smoker    Packs/day: 1.50    Years: 30.00    Pack years: 45.00    Types: Cigarettes    Quit date: 2009    Years since quitting: 13.2  . Smokeless tobacco: Never Used  Vaping Use  . Vaping Use: Never used  Substance and Sexual Activity  . Alcohol use: Yes    Comment: social  . Drug use: Never  . Sexual activity: Not on file  Other Topics Concern  . Not on file  Social History Narrative  . Not on file   Social Determinants of Health   Financial Resource Strain: Not on file  Food Insecurity: Not on file  Transportation Needs: Not on file  Physical Activity: Not on file  Stress: Not on file  Social Connections: Not on file  Intimate Partner Violence: Not on file    Family History  Problem Relation Age of Onset  . Kidney disease Mother   . Hypertension Mother   . Glaucoma  Mother   . Emphysema Father   . Asthma Father   . Hypertension Father   . Heart disease Father   . COPD Brother   . Allergic rhinitis Neg Hx   . Angioedema Neg Hx   . Atopy Neg Hx   . Eczema Neg Hx   . Immunodeficiency Neg Hx   . Urticaria Neg Hx     Current Outpatient Medications  Medication Sig Dispense Refill  . acetaminophen (TYLENOL) 325 MG tablet Take 1-2 tablets (325-650 mg total) by mouth every 6 (six) hours as needed for mild pain (or temp >/= 101 F).    [name] aspirin EC 81 MG tablet Take 81 mg by mouth daily.    . dorzolamide-timolol (COSOPT) 22.3-6.8 MG/ML ophthalmic solution Place 1 drop into both eyes 2 (two) times daily. 10 mL 4  . ELDERBERRY PO Take 2 capsules by mouth daily.    [name] escitalopram (LEXAPRO) 10  MG tablet Take 10 mg by mouth daily.    [name] GLUCOSAMINE-CHONDROITIN PO Take 1 tablet by mouth 2 (two) times daily.     . hydrochlorothiazide (HYDRODIURIL) 25 MG tablet Take 25 mg by mouth daily.     [name] losartan (COZAAR) 100 MG tablet Take 100 mg by mouth daily.    [name] MELATONIN ER PO Melatonin    . montelukast (SINGULAIR) 10 MG tablet TAKE 1 TABLET(10 MG) BY MOUTH AT BEDTIME (Patient taking differently: Take 10 mg by mouth at bedtime.) 30 tablet 0  . Multiple Vitamin (MULTIVITAMIN) capsule Take 1 capsule by mouth daily.    . Multiple Vitamins-Minerals (AIRBORNE GUMMIES PO) Take 3 tablets by mouth daily.     [name] terbinafine (LAMISIL) 250 MG tablet 1 tablet    . Vitamin D, Ergocalciferol, (DRISDOL) 1.25 MG (50000 UT) CAPS capsule Take 1 capsule (50,000 Units total) by mouth every 7 (seven) days. (Patient taking differently: Take 50,000 Units by mouth every Monday.) 4 capsule 0  . pravastatin (PRAVACHOL) 40 MG tablet Take 1 tablet (40 mg total) by mouth every evening. (Patient taking differently: Take 40 mg by mouth daily. ) 90 tablet 3   No current facility-administered medications for this visit.    Allergies  Allergen Reactions  . Penicillins Anaphylaxis and Itching    Did it involve swelling of the face/tongue/throat, SOB, or low BP? Yes Did it involve sudden or severe rash/hives, skin peeling, or any reaction on the inside of your mouth or nose? No Did you need to seek medical attention at a hospital or doctor's office? Yes When did it last happen?Childhood allergy If all above answers are "NO", may proceed with cephalosporin use.   . Mite (D. Farinae)     Other reaction(s): sinus swelling     REVIEW OF SYSTEMS:  [X]  denotes positive finding, [ ]  denotes negative finding Cardiac  Comments:  Chest pain or chest pressure:    Shortness of breath upon exertion:    Short of breath when lying flat:    Irregular heart rhythm:        Vascular    Pain in calf, thigh, or hip brought on  by ambulation:    Pain in feet at night that wakes you up from your sleep:     Blood clot in your veins:    Leg swelling:         Pulmonary    Oxygen at home:    Productive cough:     Wheezing:         Neurologic  Sudden weakness in arms or legs:     Sudden numbness in arms or legs:     Sudden onset of difficulty speaking or slurred speech:    Temporary loss of vision in one eye:     Problems with dizziness:         Gastrointestinal    Blood in stool:     Vomited blood:         Genitourinary    Burning when urinating:     Blood in urine:        Psychiatric    Major depression:         Hematologic    Bleeding problems:    Problems with blood clotting too easily:        Skin    Rashes or ulcers:        Constitutional    Fever or chills:      PHYSICAL EXAMINATION:  Vitals:   01/10/21 1404  BP: 129/62  Pulse: (!) 49  Resp: 20  Temp: 98.3 F (36.8 C)  TempSrc: Temporal  SpO2: 97%  Weight: 224 lb 3.2 oz (101.7 kg)  Height: 5\' 3"  (1.6 m)    General:  WDWN in NAD; vital signs documented above Gait: Normal HENT: WNL, normocephalic Pulmonary: normal non-labored breathing , without wheezing Cardiac: regular HR, without  Murmurs with carotid bruit right Abdomen: obese Vascular Exam/Pulses:  Right Left  Radial 2+ (normal) 2+ (normal)  Femoral 2+ (normal) 2+ (normal)  Popliteal 2+ (normal) 2+ (normal)  DP 2+ (normal) 2+ (normal)  PT 2+ (normal) 2+ (normal)   Extremities: without ischemic changes, without Gangrene , without cellulitis; without open wounds;  Musculoskeletal: no muscle wasting or atrophy  Neurologic: A&O X 3;  No focal weakness or paresthesias are detected Psychiatric:  The pt has Normal affect.   Non-Invasive Vascular Imaging:   Her ABI today demonstrates normal triphasic flow bilaterally with right toe pressure of 123 and left of 111  Bilateral carotid duplex:  Right Carotid: Velocities in the right ICA are consistent with a 60-79%  stenosis (PSV 360/EDV 85 cm/s). The ECA appears >50% stenosed. Left Carotid: Velocities in the left ICA are consistent with a 1-39% stenosis.   ASSESSMENT/PLAN:: 75 y.o. female here for follow up for evaluation of peripheral artery disease. She does not have any symptoms of claudication, rest pain or tissue loss. Her ABIs today are completely normal with triphasic flow bilaterally. Her legs are well perfused and warm with palpable pulses throughout. She has hx of left CEA for symptomatic ICA stenosis. She has had no recurrent or new neurological symptoms. Her duplex today shows some increased velocities in the right ICA putting her into the 60-79% stenosis range. Her left remains with 1-39% stenosis. - She will continue her Aspirin and statin - encourage her to continue her walking regimen - She does not need to have any repeat arterial studies with adequate perfusion of bilateral lower extremities - Discussed importance of continued good blood pressure control - Reviewed signs and symptoms of TIA and stroke and she understands should these occur to go to the ER - she will follow up again in 6 months with repeat Carotid Duplex   Karoline Caldwell, PA-C [facility] [phone]  Clinic MD:  Donzetta Matters

## 2021-01-15 ENCOUNTER — Other Ambulatory Visit: Payer: Self-pay

## 2021-01-15 DIAGNOSIS — I6523 Occlusion and stenosis of bilateral carotid arteries: Secondary | ICD-10-CM

## 2021-01-24 ENCOUNTER — Encounter (INDEPENDENT_AMBULATORY_CARE_PROVIDER_SITE_OTHER): Payer: Medicare Other | Admitting: Ophthalmology

## 2021-01-27 NOTE — Progress Notes (Signed)
[facility] Note  01/28/2021     CHIEF COMPLAINT Patient presents for Retina Follow Up   HISTORY OF PRESENT ILLNESS: Kristina [name] is a 75 y.o. female who presents to the clinic today for:   HPI    Retina Follow Up    Patient presents with  Other.  In left eye.  This started years ago.  Severity is moderate.  Duration of 1 year.  Since onset it is stable.  I, the attending physician,  performed the HPI with the patient and updated documentation appropriately.          Comments    75 y/o female pt here for 1 yr f/u for ocular ischemic syndrome OS.  No change in [place] OU.  Denies pain, FOL, floaters.  Cosopt BID OU.       Last edited by Bernarda Caffey, MD on 01/30/2021 10:41 PM. (History)    pt states vision is doing well, no new episodes of vision blacking out, no fol or floaters, pt is on Cosopt per Dr. Shirleen Schirmer, she sees him again in 4 months  Referring physician: Kathyrn Lass, MD [place],  [place] [address]  HISTORICAL INFORMATION:   Selected notes from the MEDICAL RECORD NUMBER Referred by Dr. Parke Simmers for eval of transient loss of vision OS.   CURRENT MEDICATIONS: Current Outpatient Medications (Ophthalmic Drugs)  Medication Sig  . dorzolamide-timolol (COSOPT) 22.3-6.8 MG/ML ophthalmic solution Place 1 drop into both eyes 2 (two) times daily.   No current facility-administered medications for this visit. (Ophthalmic Drugs)   Current Outpatient Medications (Other)  Medication Sig  . acetaminophen (TYLENOL) 325 MG tablet Take 1-2 tablets (325-650 mg total) by mouth every 6 (six) hours as needed for mild pain (or temp >/= 101 F).  [name] aspirin EC 81 MG tablet Take 81 mg by mouth daily.  [name] ELDERBERRY PO Take 2 capsules by mouth daily.  [name] escitalopram (LEXAPRO) 10 MG tablet Take 10 mg by mouth daily.  [name] GLUCOSAMINE-CHONDROITIN PO Take 1 tablet by mouth 2 (two) times daily.   . hydrochlorothiazide (HYDRODIURIL) 25 MG tablet Take  25 mg by mouth daily.   [name] losartan (COZAAR) 100 MG tablet Take 100 mg by mouth daily.  [name] MELATONIN ER PO Melatonin  . montelukast (SINGULAIR) 10 MG tablet TAKE 1 TABLET(10 MG) BY MOUTH AT BEDTIME (Patient taking differently: Take 10 mg by mouth at bedtime.)  . Multiple Vitamin (MULTIVITAMIN) capsule Take 1 capsule by mouth daily.  . Multiple Vitamins-Minerals (AIRBORNE GUMMIES PO) Take 3 tablets by mouth daily.   . pravastatin (PRAVACHOL) 40 MG tablet Take 1 tablet (40 mg total) by mouth every evening. (Patient taking differently: Take 40 mg by mouth daily. )  . terbinafine (LAMISIL) 250 MG tablet 1 tablet  . Vitamin D, Ergocalciferol, (DRISDOL) 1.25 MG (50000 UT) CAPS capsule Take 1 capsule (50,000 Units total) by mouth every 7 (seven) days. (Patient taking differently: Take 50,000 Units by mouth every Monday.)   No current facility-administered medications for this visit. (Other)      REVIEW OF SYSTEMS: ROS    Positive for: Neurological, Musculoskeletal, Cardiovascular, Eyes   Negative for: Constitutional, Gastrointestinal, Skin, Genitourinary, HENT, Endocrine, Respiratory, Psychiatric, Allergic/Imm, Heme/Lymph   Last edited by Matthew Folks, COA on 01/28/2021  9:21 AM. (History)       ALLERGIES Allergies  Allergen Reactions  . Penicillins Anaphylaxis and Itching    Did it involve swelling of the face/tongue/throat,  SOB, or low BP? Yes Did it involve sudden or severe rash/hives, skin peeling, or any reaction on the inside of your mouth or nose? No Did you need to seek medical attention at a hospital or doctor's office? Yes When did it last happen?Childhood allergy If all above answers are "NO", may proceed with cephalosporin use.   . Mite (D. Farinae)     Other reaction(s): sinus swelling    PAST MEDICAL HISTORY Past Medical History:  Diagnosis Date  . Allergic rhinitis   . Anemia glaucoma  . Anxiety   . Back pain   . Cataract    Mixed OU  . DDD (degenerative  disc disease), lumbar   . High blood pressure   . High cholesterol   . History of stomach ulcers   . Hypertensive retinopathy    OU  . Rheumatic fever   . Rheumatoid arteritis ([place])    Past Surgical History:  Procedure Laterality Date  . APPENDECTOMY  1955  . CHOLECYSTECTOMY  1963  . ENDARTERECTOMY Left 09/27/2019   Procedure: ENDARTERECTOMY CAROTID LEFT;  Surgeon: Serafina Mitchell, MD;  Location: [place];  Service: Vascular;  Laterality: Left;  . HERNIA REPAIR  2007  . HERNIA REPAIR  2015  . PATCH ANGIOPLASTY Left 09/27/2019   Procedure: Patch Angioplasty Using Rueben Bash;  Surgeon: Serafina Mitchell, MD;  Location: [place];  Service: Vascular;  Laterality: Left;  . REPLACEMENT TOTAL KNEE  2016  . TONSILLECTOMY      FAMILY HISTORY Family History  Problem Relation Age of Onset  . Kidney disease Mother   . Hypertension Mother   . Glaucoma Mother   . Emphysema Father   . Asthma Father   . Hypertension Father   . Heart disease Father   . COPD Brother   . Allergic rhinitis Neg Hx   . Angioedema Neg Hx   . Atopy Neg Hx   . Eczema Neg Hx   . Immunodeficiency Neg Hx   . Urticaria Neg Hx     SOCIAL HISTORY Social History   Tobacco Use  . Smoking status: Former Smoker    Packs/day: 1.50    Years: 30.00    Pack years: 45.00    Types: Cigarettes    Quit date: 2009    Years since quitting: 13.3  . Smokeless tobacco: Never Used  Vaping Use  . Vaping Use: Never used  Substance Use Topics  . Alcohol use: Yes    Comment: social  . Drug use: Never         OPHTHALMIC EXAM:  Base Eye Exam    Visual Acuity (Snellen - Linear)      Right Left   Dist cc 20/30 20/30   Dist ph cc 20/25 20/25   Correction: Glasses       Tonometry (Tonopen, 9:23 AM)      Right Left   Pressure 18 17       Pupils      Dark Light Shape React APD   Right 4 3 Round Brisk None   Left 4 3 Round Brisk None       Visual Fields (Counting fingers)      Left Right    Full Full        Extraocular Movement      Right Left    Full, Ortho Full, Ortho       Neuro/Psych    Oriented x3: Yes   Mood/Affect: Normal  Dilation    Both eyes: 1.0% Mydriacyl, 2.5% Phenylephrine @ 9:23 AM        Slit Lamp and Fundus Exam    Slit Lamp Exam      Right Left   Lids/Lashes Dermatochalasis - upper lid, mild Meibomian gland dysfunction Dermatochalasis - upper lid, Meibomian gland dysfunction, mild Telangiectasia   Conjunctiva/Sclera White and quiet white and quiet   Cornea mild EBMD, 1+ Punctate epithelial erosions, Arcus mild EBMD, 1+ Punctate epithelial erosions, Arcus   Anterior Chamber Deep and quiet Deep and quiet   Iris Round and dilated Round and dilated   Lens 2-3+ Nuclear sclerosis with mild brunescence, 2-3+ Cortical cataract 3+ Nuclear sclerosis with mild brunescence, 3+ Cortical cataract   Vitreous Mild Vitreous syneresis, Posterior vitreous detachment Mild Vitreous syneresis       Fundus Exam      Right Left   Disc Pink and Sharp, narrow inferior rim, +cupping Pink and Sharp, +cupping, focal temporal PPP   C/D Ratio 0.75 0.7   Macula Flat, Blunted foveal reflex, mild Retinal pigment epithelial mottling and clumping, No heme or edema Flat, Blunted foveal reflex, mild Retinal pigment epithelial mottling and clumping, No heme or edema   Vessels attenuated, mild Copper wiring, mild AV crossing changes attenuated, mild Copper wiring, mild AV crossing changes   Periphery Attached, no heme  Attached, no heme          IMAGING AND PROCEDURES  Imaging and Procedures for @TODAY @  OCT, Retina - OU - Both Eyes       Right Eye Quality was good. Central Foveal Thickness: 265. Progression has been stable. Findings include normal foveal contour, no IRF, no SRF.   Left Eye Quality was good. Central Foveal Thickness: 259. Progression has been stable. Findings include normal foveal contour, no IRF, no SRF.   Notes *Images captured and stored on drive  Diagnosis /  Impression:  NFP, no IRF/SRF OU  Clinical management:  See below  Abbreviations: NFP - Normal foveal profile. CME - cystoid macular edema. PED - pigment epithelial detachment. IRF - intraretinal fluid. SRF - subretinal fluid. EZ - ellipsoid zone. ERM - epiretinal membrane. ORA - outer retinal atrophy. ORT - outer retinal tubulation. SRHM - subretinal hyper-reflective material                 ASSESSMENT/PLAN:    ICD-10-CM   1. Transient vision disturbance of left eye  H53.9   2. 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 Retinal edema  H35.81 OCT, Retina - OU - Both Eyes  5. Essential hypertension  I10   6. Hypertensive retinopathy of both eyes  H35.033   7. Glaucoma suspect of both eyes  H40.003   8. Combined forms of age-related cataract of both eyes  H25.813     1-3. Transient Vision Loss OS secondary to ocular ischemic syndrome and L carotid stenosis  - pt had episodes of "graying out" of vision described as curtain coming from below; variable duration and intensity/degree of vision loss; duration on the order of minutes; frequency is multiple per week, with increased frequency of episodes over the week prior to last visit.  - dilated eye exam w/ mild hypertensive changes in vasculature; no hemorrhage  - FA (12.01.20) showed mildly delayed filling time OS -- no leakage or vascular perfusion defects  - concern for vascular etiology / transient retinal ischemia  - case discussed with PCP,  Dr. Sabra Heck, who arranged for cardiology consult and carotid ultrasound and echocardiogram  - carotid ultrasound on 12.18.2020 showed 80-99% stenosis of left ICA and pt underwent urgent CEA on 12.23.2020 w/ Dr. Trula Slade  - pt now w/ stable resolution of symptoms -- no repeat episodes of transient vision loss  - repeat FA 4.22.21 showed persistent mildly delayed filling time OS -- no significant change from prior  - eye exam stable and BCVA OS 20/25   -  f/u 1 year, sooner prn -- DFE, OCT/FA (transit OS)  4. No retinal edema on exam or OCT  5,6. Hypertensive retinopathy OU  - discussed importance of tight BP control  - monitor  7. 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 Shirleen Schirmer  8. 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  - discussed diagnosis and progression  - now under the expert management of Dr. Shirleen Schirmer   Ophthalmic Meds Ordered this visit:  No orders of the defined types were placed in this encounter.     Return in about 1 year (around 01/28/2022) for f/u ocular ischemic syndrome OS, DFE, OCT.  There are no Patient Instructions on file for this visit.   Explained the diagnoses, plan, and follow up with the patient and they expressed understanding.  Patient expressed understanding of the importance of proper follow up care.   This document serves as a record of services personally performed by Gardiner Sleeper, MD, PhD. It was created on their behalf by Estill Bakes, COT an ophthalmic technician. The creation of this record is the provider's dictation and/or activities during the visit.    Electronically signed by: Estill Bakes, COT 4.25.22 @ 10:42 PM   This document serves as a record of services personally performed by Gardiner Sleeper, MD, PhD. It was created on their behalf by San Jetty. Owens Shark, OA an ophthalmic technician. The creation of this record is the provider's dictation and/or activities during the visit.    Electronically signed by: San Jetty. Owens Shark, [place] 04.26.2022 10:42 PM  Gardiner Sleeper, M.D., Ph.D. Diseases & Surgery of the Retina and [place] 01/28/2021    I have reviewed the above documentation for accuracy and completeness, and I agree with the above. Gardiner Sleeper, M.D., Ph.D. 01/30/21 10:46  PM   Abbreviations: M myopia (nearsighted); A astigmatism; H hyperopia (farsighted); P presbyopia; Mrx spectacle prescription;  CTL contact lenses; OD right eye; OS left eye; OU both eyes  XT exotropia; ET esotropia; PEK punctate epithelial keratitis; PEE punctate epithelial erosions; DES dry eye syndrome; MGD meibomian gland dysfunction; ATs artificial tears; PFAT's preservative free artificial tears; [place] nuclear sclerotic cataract; PSC posterior subcapsular cataract; ERM epi-retinal membrane; PVD posterior vitreous detachment; RD retinal detachment; DM diabetes mellitus; DR diabetic retinopathy; NPDR non-proliferative diabetic retinopathy; PDR proliferative diabetic retinopathy; CSME clinically significant macular edema; DME diabetic macular edema; dbh dot blot hemorrhages; CWS cotton wool spot; POAG primary open angle glaucoma; C/D cup-to-disc ratio; HVF humphrey visual field; GVF goldmann visual field; OCT optical coherence tomography; IOP intraocular pressure; BRVO Branch retinal vein occlusion; CRVO central retinal vein occlusion; CRAO central retinal artery occlusion; BRAO branch retinal artery occlusion; RT retinal tear; SB scleral buckle; PPV pars plana vitrectomy; VH Vitreous hemorrhage; PRP panretinal laser photocoagulation; IVK intravitreal kenalog; VMT vitreomacular  traction; MH Macular hole;  NVD neovascularization of the disc; NVE neovascularization elsewhere; AREDS age related eye disease study; ARMD age related macular degeneration; POAG primary open angle glaucoma; EBMD epithelial/anterior basement membrane dystrophy; ACIOL anterior chamber intraocular lens; IOL intraocular lens; PCIOL posterior chamber intraocular lens; Phaco/IOL phacoemulsification with intraocular lens placement; [place] photorefractive keratectomy; LASIK laser assisted in situ keratomileusis; HTN hypertension; DM diabetes mellitus; COPD chronic obstructive pulmonary disease

## 2021-01-28 ENCOUNTER — Ambulatory Visit (INDEPENDENT_AMBULATORY_CARE_PROVIDER_SITE_OTHER): Payer: Medicare Other | Admitting: Ophthalmology

## 2021-01-28 ENCOUNTER — Encounter (INDEPENDENT_AMBULATORY_CARE_PROVIDER_SITE_OTHER): Payer: Self-pay | Admitting: Ophthalmology

## 2021-01-28 ENCOUNTER — Other Ambulatory Visit: Payer: Self-pay

## 2021-01-28 DIAGNOSIS — H40003 Preglaucoma, unspecified, bilateral: Secondary | ICD-10-CM

## 2021-01-28 DIAGNOSIS — H3581 Retinal edema: Secondary | ICD-10-CM | POA: Diagnosis not present

## 2021-01-28 DIAGNOSIS — I10 Essential (primary) hypertension: Secondary | ICD-10-CM

## 2021-01-28 DIAGNOSIS — H35033 Hypertensive retinopathy, bilateral: Secondary | ICD-10-CM | POA: Diagnosis not present

## 2021-01-28 DIAGNOSIS — H25813 Combined forms of age-related cataract, bilateral: Secondary | ICD-10-CM

## 2021-01-28 DIAGNOSIS — H3582 Retinal ischemia: Secondary | ICD-10-CM

## 2021-01-28 DIAGNOSIS — I6522 Occlusion and stenosis of left carotid artery: Secondary | ICD-10-CM | POA: Diagnosis not present

## 2021-01-28 DIAGNOSIS — H539 Unspecified visual disturbance: Secondary | ICD-10-CM

## 2021-01-30 ENCOUNTER — Encounter (INDEPENDENT_AMBULATORY_CARE_PROVIDER_SITE_OTHER): Payer: Self-pay | Admitting: Ophthalmology

## 2021-02-11 ENCOUNTER — Other Ambulatory Visit: Payer: Self-pay

## 2021-02-11 ENCOUNTER — Encounter: Payer: Self-pay | Admitting: Cardiovascular Disease

## 2021-02-11 ENCOUNTER — Ambulatory Visit: Payer: Medicare Other | Admitting: Cardiovascular Disease

## 2021-02-11 DIAGNOSIS — I6522 Occlusion and stenosis of left carotid artery: Secondary | ICD-10-CM | POA: Diagnosis not present

## 2021-02-11 DIAGNOSIS — E782 Mixed hyperlipidemia: Secondary | ICD-10-CM

## 2021-02-11 DIAGNOSIS — I10 Essential (primary) hypertension: Secondary | ICD-10-CM

## 2021-02-11 DIAGNOSIS — Z6841 Body Mass Index (BMI) 40.0 and over, adult: Secondary | ICD-10-CM

## 2021-02-11 DIAGNOSIS — I350 Nonrheumatic aortic (valve) stenosis: Secondary | ICD-10-CM | POA: Diagnosis not present

## 2021-02-11 NOTE — Assessment & Plan Note (Signed)
History of hyperlipidemia on statin therapy with lipid profile performed 05/27/2020 revealing total cholesterol of 155, LDL 65 and HDL of 74.

## 2021-02-11 NOTE — Assessment & Plan Note (Signed)
History of carotid artery disease status post left carotid endarterectomy with Dr. Trula Slade.  He follows her carotid Dopplers which were done most recently on 01/10/2021 revealing a widely patent endarterectomy site with moderate right ICA stenosis

## 2021-02-11 NOTE — Progress Notes (Signed)
02/11/2021 Kristina [name]   16-Nov-1945  [id number]  Primary Physician Kathyrn Lass, MD Primary Cardiologist: Lorretta Harp MD [name], [place]  HPI:  Kristina [name] is a 75 y.o.  moderately overweight single Caucasian female mother of 1 child, grandmother of 1 grandchild referred to me by Dr. Kathyrn Lass for symptomatic carotid artery disease.  She is a retired Engineer, materials in [employer]  She moved from [place] to [place] 2 years ago to be closer to family.  She worked at [employer] for 50 years.  I last saw her in the office 12/22/2019. Risk factors include remote tobacco abuse having quit 20 years ago, treated hypertension hyperlipidemia.  There is no family history for heart disease.  She is never had a heart attack or stroke.  She denies chest pain or shortness of breath.  She did lose 166 pounds 12 years ago and has been struggling with weight but is on a diet.  She began having amaurosis within the last several months and had the worst episode on Thanksgiving.  Her last episode was 2 days ago.  She has seen a retinal specialist who did not find any retinal abnormalities apparently.  She had a 2D echocardiogram performed today that showed normal LV systolic function without valvular abnormalities with grade 1 diastolic dysfunction and mild aortic stenosis.  Carotid Dopplers revealed moderate right and high-grade left ICA stenosis.  Since I saw her a year ago she continues to do well.  She has however gained over 20 pounds and wishes to lose this.  She denies chest pain or shortness of breath.  She walks her dog daily in the park.  Dr. Trula Slade follows her carotid Dopplers.  Her most recent 2D echocardiogram performed 12//21 revealed normal LV systolic function with moderate aortic stenosis and a valve area 1.18 cm with a mean gradient of 14 mmHg.   Current Meds  Medication Sig  . acetaminophen (TYLENOL) 325 MG tablet Take 1-2 tablets (325-650 mg total) by  mouth every 6 (six) hours as needed for mild pain (or temp >/= 101 F).  [name] aspirin EC 81 MG tablet Take 81 mg by mouth daily.  . dorzolamide-timolol (COSOPT) 22.3-6.8 MG/ML ophthalmic solution Place 1 drop into both eyes 2 (two) times daily.  [name] ELDERBERRY PO Take 2 capsules by mouth daily.  [name] escitalopram (LEXAPRO) 10 MG tablet Take 10 mg by mouth daily.  [name] GLUCOSAMINE-CHONDROITIN PO Take 1 tablet by mouth 2 (two) times daily.   . hydrochlorothiazide (HYDRODIURIL) 25 MG tablet Take 25 mg by mouth daily.   [name] losartan (COZAAR) 100 MG tablet Take 100 mg by mouth daily.  [name] MELATONIN ER PO Melatonin  . montelukast (SINGULAIR) 10 MG tablet TAKE 1 TABLET(10 MG) BY MOUTH AT BEDTIME (Patient taking differently: Take 10 mg by mouth at bedtime.)  . Multiple Vitamin (MULTIVITAMIN) capsule Take 1 capsule by mouth daily.  . Multiple Vitamins-Minerals (AIRBORNE GUMMIES PO) Take 3 tablets by mouth daily.   . pravastatin (PRAVACHOL) 40 MG tablet Take 1 tablet (40 mg total) by mouth every evening. (Patient taking differently: Take 40 mg by mouth daily.)  . terbinafine (LAMISIL) 250 MG tablet 1 tablet  . Vitamin D, Ergocalciferol, (DRISDOL) 1.25 MG (50000 UT) CAPS capsule Take 1 capsule (50,000 Units total) by mouth every 7 (seven) days. (Patient taking differently: Take 50,000 Units by mouth every Monday.)     Allergies  Allergen Reactions  . Penicillins Anaphylaxis and Itching  Did it involve swelling of the face/tongue/throat, SOB, or low BP? Yes Did it involve sudden or severe rash/hives, skin peeling, or any reaction on the inside of your mouth or nose? No Did you need to seek medical attention at a hospital or doctor's office? Yes When did it last happen?Childhood allergy If all above answers are "NO", may proceed with cephalosporin use.   . Mite (D. Farinae)     Other reaction(s): sinus swelling    Social History   Socioeconomic History  . Marital status: Single    Spouse name: Not on  file  . Number of children: Not on file  . Years of education: Not on file  . Highest education level: Not on file  Occupational History  . 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 Smoking status: Former Smoker    Packs/day: 1.50    Years: 30.00    Pack years: 45.00    Types: Cigarettes    Quit date: 2009    Years since quitting: 13.3  . Smokeless tobacco: Never Used  Vaping Use  . Vaping Use: Never used  Substance and Sexual Activity  . Alcohol use: Yes    Comment: social  . Drug use: Never  . Sexual activity: Not on file  Other Topics Concern  . Not on file  Social History Narrative  . Not on file   Social Determinants of Health   Financial Resource Strain: Not on file  Food Insecurity: Not on file  Transportation Needs: Not on file  Physical Activity: Not on file  Stress: Not on file  Social Connections: Not on file  Intimate Partner Violence: Not on file     Review of Systems: General: negative for chills, fever, night sweats or weight changes.  Cardiovascular: negative for chest pain, dyspnea on exertion, edema, orthopnea, palpitations, paroxysmal nocturnal dyspnea or shortness of breath Dermatological: negative for rash Respiratory: negative for cough or wheezing Urologic: negative for hematuria Abdominal: negative for nausea, vomiting, diarrhea, bright red blood per rectum, melena, or hematemesis Neurologic: negative for visual changes, syncope, or dizziness All other systems reviewed and are otherwise negative except as noted above.    Blood pressure (!) 168/78, pulse (!) 52, height 5' 3.5" (1.613 m), weight 230 lb 3.2 oz (104.4 kg).  General appearance: alert and no distress Neck: no adenopathy, no JVD, supple, symmetrical, trachea midline, thyroid not enlarged, symmetric, no tenderness/mass/nodules and Right carotid bruit Lungs: clear to auscultation bilaterally Heart: 2/6 outflow tract murmur consistent with aortic  stenosis. Extremities: extremities normal, atraumatic, no cyanosis or edema Pulses: 2+ and symmetric Skin: Skin color, texture, turgor normal. No rashes or lesions Neurologic: Alert and oriented X 3, normal strength and tone. Normal symmetric reflexes. Normal coordination and gait  EKG sinus bradycardia 52 without ST or T wave changes.  Personally reviewed this EKG.  ASSESSMENT AND PLAN:   Hyperlipidemia History of hyperlipidemia on statin therapy with lipid profile performed 05/27/2020 revealing total cholesterol of 155, LDL 65 and HDL of 74.  Essential hypertension I will reach blood pressure history of essential hypertension blood pressure measured today at 168/78.  She is on losartan and hydrochlorothiazide.  Class 3 severe obesity with serious comorbidity and body mass index (BMI) of 40.0 to 44.9 in adult [facility] [place]) History of severe obesity with a BMI of 40.  She is interested in weight loss.  I am going to refer her [facility].  Left carotid artery stenosis History of carotid artery disease status post left carotid endarterectomy with Dr. Trula Slade.  He follows her carotid Dopplers which were done most recently on 01/10/2021 revealing a widely patent endarterectomy site with moderate right ICA stenosis  Aortic stenosis History of mild to moderate aortic stenosis with 2D echo performed 09/09/2020 revealing normal LV systolic function with an aortic valve area of 1.18 cm and a mean gradient of 14 mmHg.  She is asymptomatic.  This will be repeated on an annual basis.      Lorretta Harp MD FACP,FACC,FAHA, [facility] 02/11/2021 11:09 AM

## 2021-02-11 NOTE — Patient Instructions (Signed)
Medication Instructions:  Your physician recommends that you continue on your current medications as directed. Please refer to the Current Medication list given to you today.  *If you need a refill on your cardiac medications before your next appointment, please call your pharmacy*   Testing/Procedures: Your physician has requested that you have an echocardiogram. Echocardiography is a painless test that uses sound waves to create images of your heart. It provides your doctor with information about the size and shape of your heart and how well your heart's chambers and valves are working. This procedure takes approximately one hour. There are no restrictions for this procedure. This procedure is done at [address] 3rd Floor. To be done in December 2022.     Follow-Up: At [facility], you and your health needs are our priority.  As part of our continuing mission to provide you with exceptional heart care, we have created designated Provider Care Teams.  These Care Teams include your primary Cardiologist (physician) and Advanced Practice Providers (APPs -  Physician Assistants and Nurse Practitioners) who all work together to provide you with the care you need, when you need it.  We recommend signing up for the patient portal called "MyChart".  Sign up information is provided on this After Visit Summary.  MyChart is used to connect with patients for Virtual Visits (Telemedicine).  Patients are able to view lab/test results, encounter notes, upcoming appointments, etc.  Non-urgent messages can be sent to your provider as well.   To learn more about what you can do with MyChart, go to [email or web address].    Your next appointment:   12 month(s)  The format for your next appointment:   In Person  Provider:   Quay Burow, MD

## 2021-02-11 NOTE — Assessment & Plan Note (Signed)
History of mild to moderate aortic stenosis with 2D echo performed 09/09/2020 revealing normal LV systolic function with an aortic valve area of 1.18 cm and a mean gradient of 14 mmHg.  She is asymptomatic.  This will be repeated on an annual basis.

## 2021-02-11 NOTE — Assessment & Plan Note (Signed)
History of severe obesity with a BMI of 40.  She is interested in weight loss.  I am going to refer her to Dr. Leafy Ro at the [facility].

## 2021-02-11 NOTE — Assessment & Plan Note (Signed)
I will reach blood pressure history of essential hypertension blood pressure measured today at 168/78.  She is on losartan and hydrochlorothiazide.

## 2021-02-27 DIAGNOSIS — E7800 Pure hypercholesterolemia, unspecified: Secondary | ICD-10-CM | POA: Diagnosis not present

## 2021-02-27 DIAGNOSIS — M158 Other polyosteoarthritis: Secondary | ICD-10-CM | POA: Diagnosis not present

## 2021-02-27 DIAGNOSIS — I10 Essential (primary) hypertension: Secondary | ICD-10-CM | POA: Diagnosis not present

## 2021-03-14 DIAGNOSIS — M158 Other polyosteoarthritis: Secondary | ICD-10-CM | POA: Diagnosis not present

## 2021-03-14 DIAGNOSIS — E7800 Pure hypercholesterolemia, unspecified: Secondary | ICD-10-CM | POA: Diagnosis not present

## 2021-03-14 DIAGNOSIS — I10 Essential (primary) hypertension: Secondary | ICD-10-CM | POA: Diagnosis not present

## 2021-05-05 DIAGNOSIS — U071 COVID-19: Secondary | ICD-10-CM | POA: Diagnosis not present

## 2021-05-06 ENCOUNTER — Ambulatory Visit (INDEPENDENT_AMBULATORY_CARE_PROVIDER_SITE_OTHER): Payer: Medicare Other | Admitting: Family Medicine

## 2021-05-09 DIAGNOSIS — M158 Other polyosteoarthritis: Secondary | ICD-10-CM | POA: Diagnosis not present

## 2021-05-09 DIAGNOSIS — I10 Essential (primary) hypertension: Secondary | ICD-10-CM | POA: Diagnosis not present

## 2021-05-09 DIAGNOSIS — E7800 Pure hypercholesterolemia, unspecified: Secondary | ICD-10-CM | POA: Diagnosis not present

## 2021-05-15 IMAGING — MR MR HEAD WO/W CM
11 of 12 series · 40 of 48 positions shown · IV contrast (17ml Multihance)
Comparison: None.

CLINICAL DATA: Visual disturbance on the left which has improved
recently.

EXAM:
MRI HEAD WITHOUT AND WITH CONTRAST
TECHNIQUE: Multiplanar, multiecho pulse sequences of the brain and surrounding
structures were obtained without and with intravenous contrast.
CONTRAST:  17mL MULTIHANCE GADOBENATE DIMEGLUMINE 529 MG/ML IV SOLN

[Series 2: T1 · sagittal · 5.0mm · 0.45mm/px · 2 of 23 slices shown]
[im 1/23]
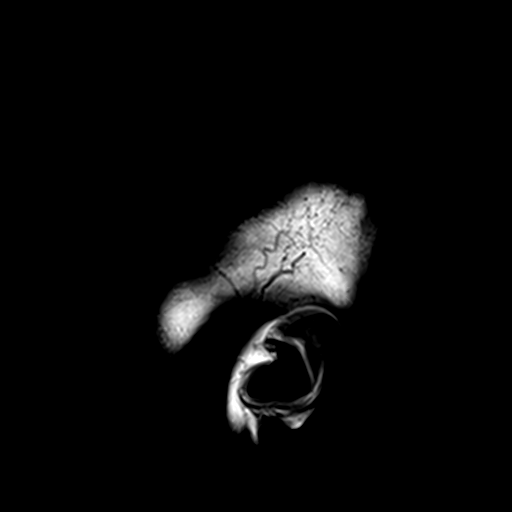
[im 23/23]
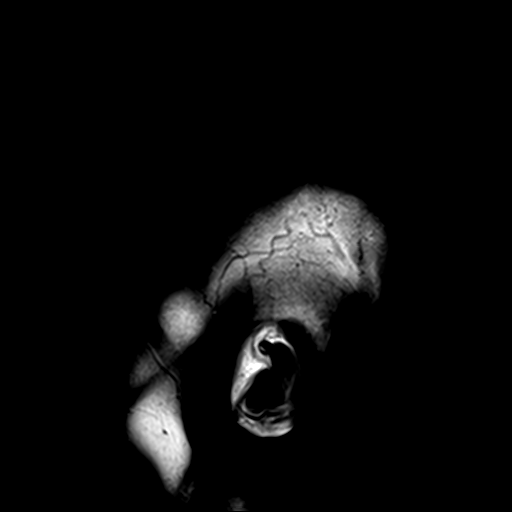

[Series 3: DWI · axial · 3.0mm · 1.80mm/px · z∈[-83,+64]mm · 6 of 100 slices shown (1 of 4)]
[im 1/100]
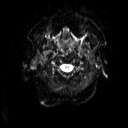
[im 20/100]
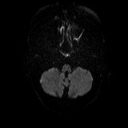
[im 40/100]
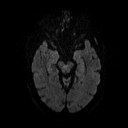
[im 60/100]
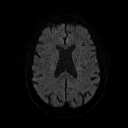
[im 80/100]
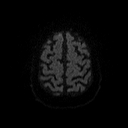
[im 100/100]
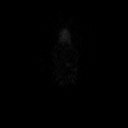

[Series 4: DWI · axial · 3.0mm · 1.80mm/px · z∈[-83,+64]mm · 3 of 48 slices shown (2 of 4)]
[im 1/48]
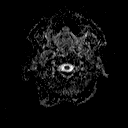
[im 24/48]
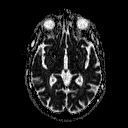
[im 48/48]
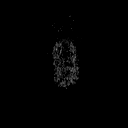

[Series 5: DWI · coronal · 5.0mm · 1.80mm/px · 4 of 72 slices shown (3 of 4)]
[im 1/72]
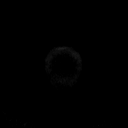
[im 24/72]
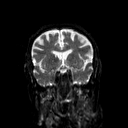
[im 48/72]
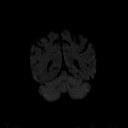
[im 72/72]
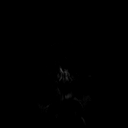

[Series 6: DWI · coronal · 5.0mm · 1.80mm/px · 2 of 36 slices shown (4 of 4)]
[im 1/36]
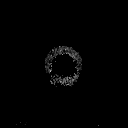
[im 36/36]
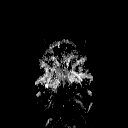

[Series 8: swi_images · axial · 2.0mm · 0.90mm/px · z∈[-87,+70]mm · 5 of 80 slices shown]
[im 1/80]
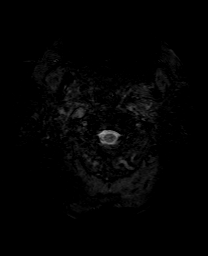
[im 20/80]
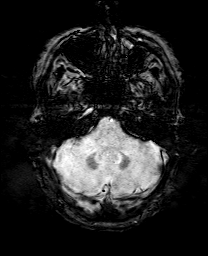
[im 40/80]
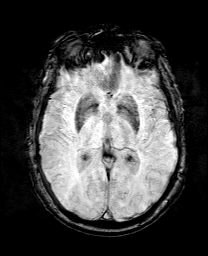
[im 60/80]
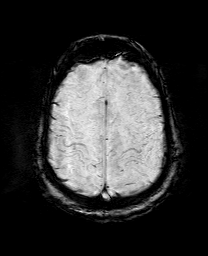
[im 80/80]
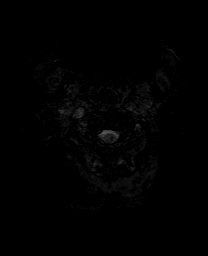

[Series 9: T2 · axial · 5.0mm · 0.51mm/px · 1 of 22 slices shown (1 of 2)]
[im 1/22]
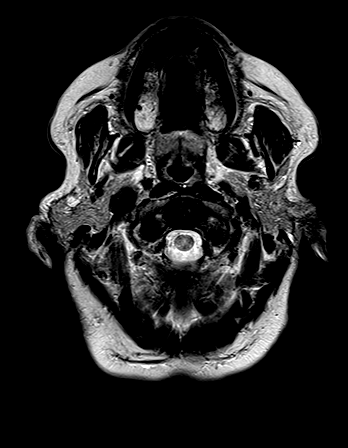

[Series 10: FLAIR · axial · 3.0mm · 0.45mm/px · z∈[-87,+69]mm · 2 of 27 slices shown]
[im 1/27]
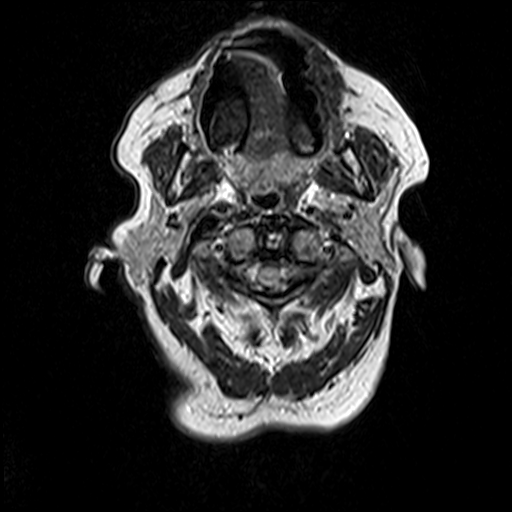
[im 27/27]
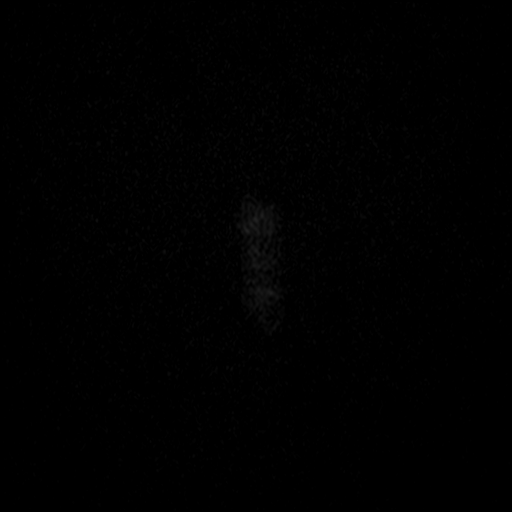

[Series 11: t1_mpr_tra copy center · axial · 1.0mm · 0.45mm/px · z∈[-87,+71]mm · 8 of 160 slices shown]
[im 1/160]
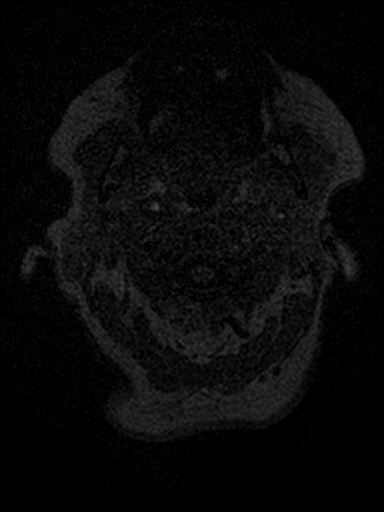
[im 18/160]
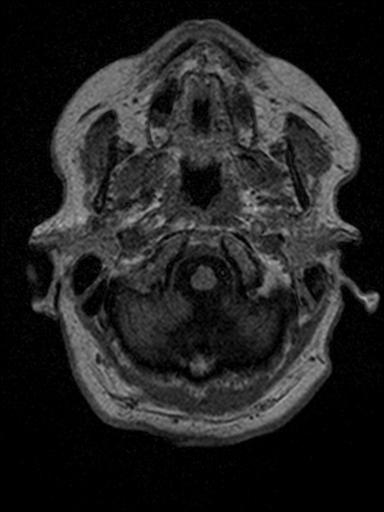
[im 54/160]
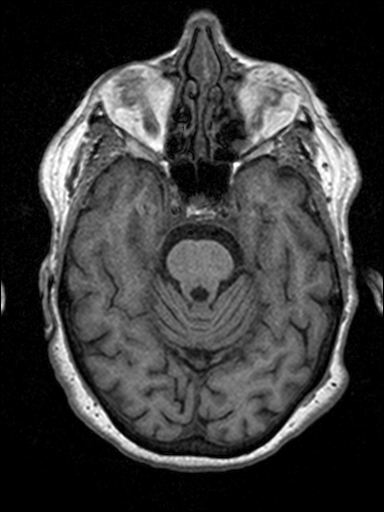
[im 71/160]
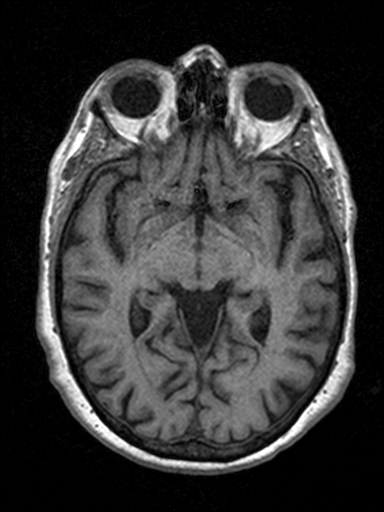
[im 89/160]
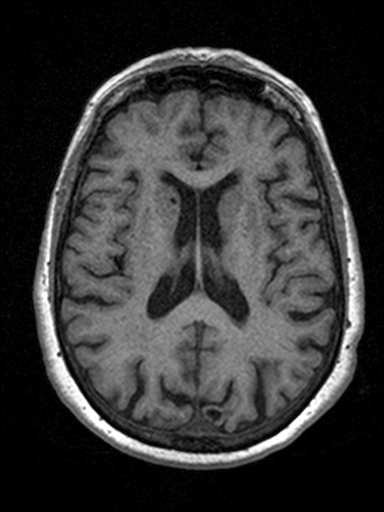
[im 107/160]
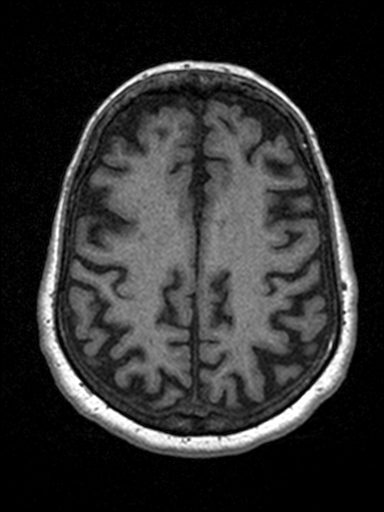
[im 142/160]
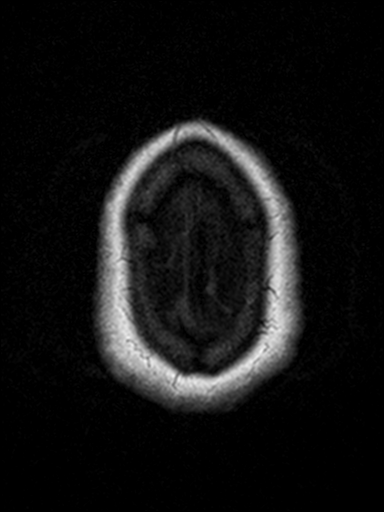
[im 160/160]
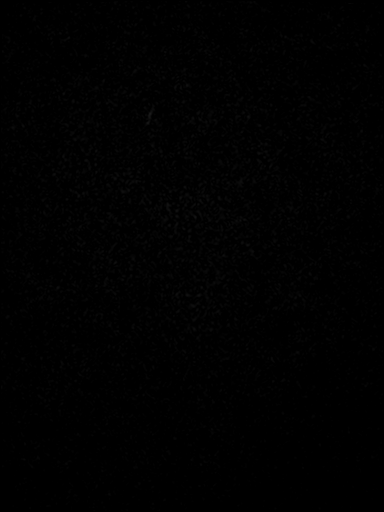

[Series 12: T2 · coronal · 5.0mm · 0.45mm/px · 2 of 29 slices shown (2 of 2)]
[im 1/29]
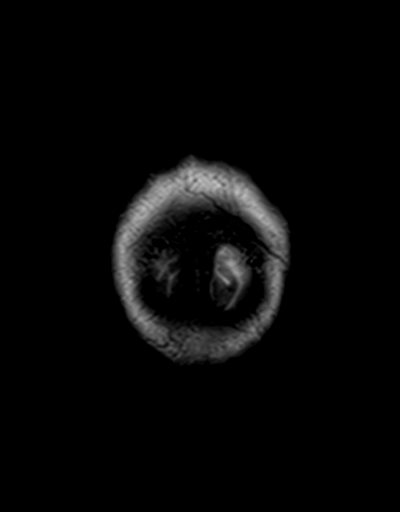
[im 29/29]
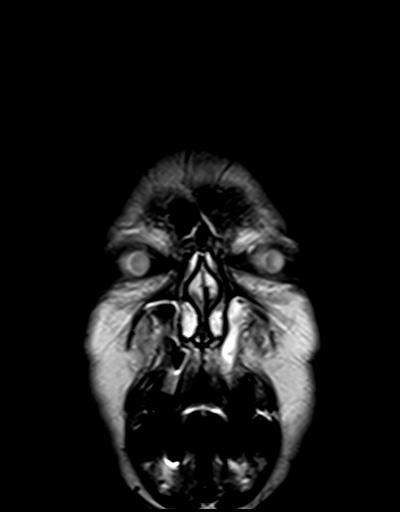

[Series 13: t1_mpr_tra · axial · 1.0mm · 0.45mm/px · z∈[-79,+9]mm · 5 of 144 slices shown]
[im 1/144]
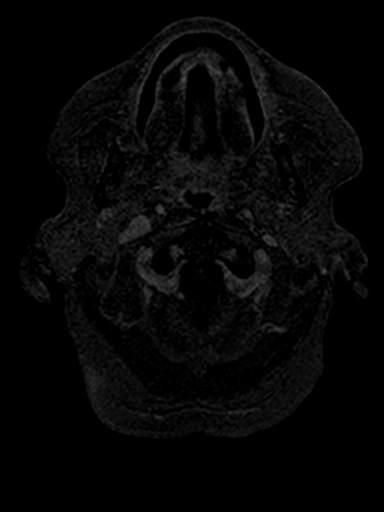
[im 18/144]
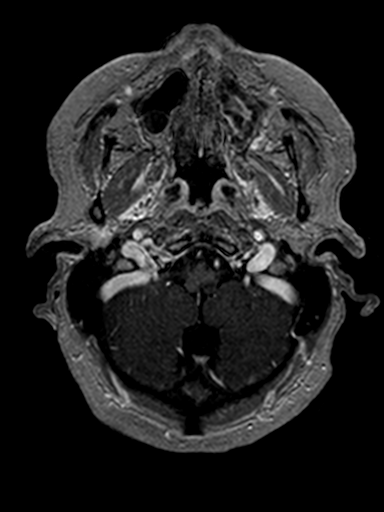
[im 36/144]
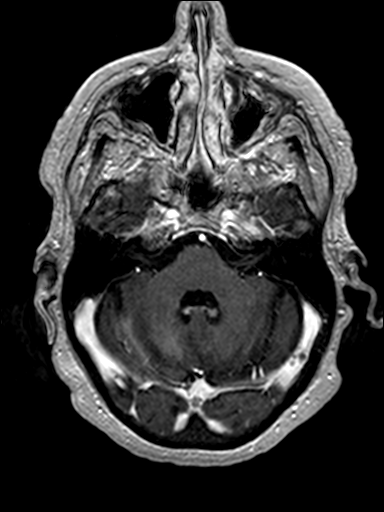
[im 54/144]
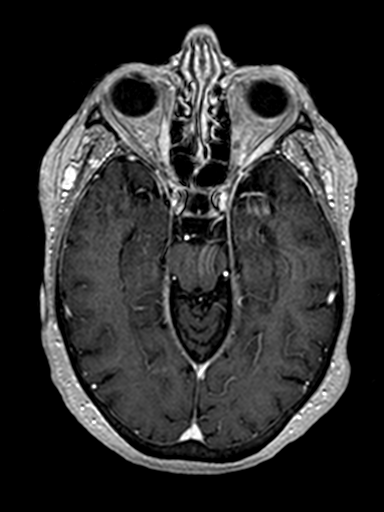
[im 90/144]
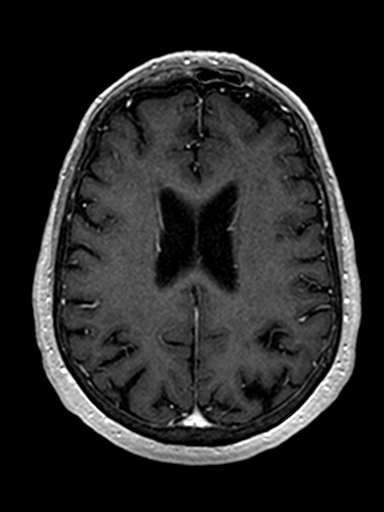

[40 of 48 positions shown; findings below may reference images not displayed]

FINDINGS: Brain: Diffusion imaging does not show any acute or subacute
infarction. No abnormality is seen affecting the brainstem or
cerebellum. Cerebral hemispheres show mild changes of small vessel
disease of the white matter, often seen at this age. There is a
small old left superior occipital cortical and subcortical
infarction. No other cortical infarction. No mass lesion,
hemorrhage, hydrocephalus or extra-axial collection. After contrast
administration, no abnormal enhancement occurs.

Vascular: Major vessels at the base of the brain show flow.

Skull and upper cervical spine: Negative

Sinuses/Orbits: Mucosal inflammatory changes of the paranasal
sinuses, most advanced of the left maxillary sinus. Orbits negative.

Other: None
IMPRESSION: Small old left superior occipital cortical and subcortical
infarction.

Minimal small vessel change of the cerebral hemispheric white matter
considering age.

Sinus inflammatory disease, particularly affecting the left
maxillary sinus.

## 2021-05-20 ENCOUNTER — Ambulatory Visit (INDEPENDENT_AMBULATORY_CARE_PROVIDER_SITE_OTHER): Payer: Medicare Other | Admitting: Family Medicine

## 2021-05-27 ENCOUNTER — Other Ambulatory Visit: Payer: Self-pay

## 2021-05-27 ENCOUNTER — Encounter (INDEPENDENT_AMBULATORY_CARE_PROVIDER_SITE_OTHER): Payer: Self-pay | Admitting: Family Medicine

## 2021-05-27 ENCOUNTER — Ambulatory Visit (INDEPENDENT_AMBULATORY_CARE_PROVIDER_SITE_OTHER): Payer: Medicare Other | Admitting: Family Medicine

## 2021-05-27 VITALS — BP 137/74 | HR 52 | Temp 98.1°F | Ht 62.0 in | Wt 228.0 lb

## 2021-05-27 DIAGNOSIS — E7849 Other hyperlipidemia: Secondary | ICD-10-CM

## 2021-05-27 DIAGNOSIS — R0602 Shortness of breath: Secondary | ICD-10-CM

## 2021-05-27 DIAGNOSIS — Z0289 Encounter for other administrative examinations: Secondary | ICD-10-CM

## 2021-05-27 DIAGNOSIS — R5383 Other fatigue: Secondary | ICD-10-CM | POA: Diagnosis not present

## 2021-05-27 DIAGNOSIS — Z1331 Encounter for screening for depression: Secondary | ICD-10-CM | POA: Diagnosis not present

## 2021-05-27 DIAGNOSIS — I10 Essential (primary) hypertension: Secondary | ICD-10-CM | POA: Diagnosis not present

## 2021-05-27 DIAGNOSIS — Z6841 Body Mass Index (BMI) 40.0 and over, adult: Secondary | ICD-10-CM | POA: Diagnosis not present

## 2021-05-27 DIAGNOSIS — E559 Vitamin D deficiency, unspecified: Secondary | ICD-10-CM | POA: Diagnosis not present

## 2021-05-27 DIAGNOSIS — R739 Hyperglycemia, unspecified: Secondary | ICD-10-CM

## 2021-05-28 LAB — CBC WITH DIFFERENTIAL
Basophils Absolute: 0 10*3/uL (ref 0.0–0.2)
Basos: 0 %
EOS (ABSOLUTE): 0.3 10*3/uL (ref 0.0–0.4)
Eos: 4 %
Hematocrit: 39.1 % (ref 34.0–46.6)
Hemoglobin: 11.8 g/dL (ref 11.1–15.9)
Immature Grans (Abs): 0 10*3/uL (ref 0.0–0.1)
Immature Granulocytes: 0 %
Lymphocytes Absolute: 1.5 10*3/uL (ref 0.7–3.1)
Lymphs: 24 %
MCH: 20.4 pg — ABNORMAL LOW (ref 26.6–33.0)
MCHC: 30.2 g/dL — ABNORMAL LOW (ref 31.5–35.7)
MCV: 68 fL — ABNORMAL LOW (ref 79–97)
Monocytes Absolute: 0.8 10*3/uL (ref 0.1–0.9)
Monocytes: 13 %
Neutrophils Absolute: 3.8 10*3/uL (ref 1.4–7.0)
Neutrophils: 59 %
RBC: 5.79 x10E6/uL — ABNORMAL HIGH (ref 3.77–5.28)
RDW: 17.1 % — ABNORMAL HIGH (ref 11.7–15.4)
WBC: 6.4 10*3/uL (ref 3.4–10.8)

## 2021-05-28 LAB — COMPREHENSIVE METABOLIC PANEL
ALT: 20 IU/L (ref 0–32)
AST: 21 IU/L (ref 0–40)
Albumin/Globulin Ratio: 2 (ref 1.2–2.2)
Albumin: 4.5 g/dL (ref 3.7–4.7)
Alkaline Phosphatase: 69 IU/L (ref 44–121)
BUN/Creatinine Ratio: 36 — ABNORMAL HIGH (ref 12–28)
BUN: 21 mg/dL (ref 8–27)
Bilirubin Total: 0.4 mg/dL (ref 0.0–1.2)
CO2: 29 mmol/L (ref 20–29)
Calcium: 9.9 mg/dL (ref 8.7–10.3)
Chloride: 98 mmol/L (ref 96–106)
Creatinine, Ser: 0.58 mg/dL (ref 0.57–1.00)
Globulin, Total: 2.2 g/dL (ref 1.5–4.5)
Glucose: 104 mg/dL — ABNORMAL HIGH (ref 65–99)
Potassium: 4.4 mmol/L (ref 3.5–5.2)
Sodium: 140 mmol/L (ref 134–144)
Total Protein: 6.7 g/dL (ref 6.0–8.5)
eGFR: 94 mL/min/{1.73_m2} (ref >59)

## 2021-05-28 LAB — HEMOGLOBIN A1C
Est. average glucose Bld gHb Est-mCnc: 117 mg/dL
Hgb A1c MFr Bld: 5.7 % — ABNORMAL HIGH (ref 4.8–5.6)

## 2021-05-28 LAB — TSH: TSH: 2.29 u[IU]/mL (ref 0.450–4.500)

## 2021-05-28 LAB — LIPID PANEL WITH LDL/HDL RATIO
Cholesterol, Total: 183 mg/dL (ref 100–199)
HDL: 68 mg/dL (ref >39)
LDL Chol Calc (NIH): 93 mg/dL (ref 0–99)
LDL/HDL Ratio: 1.4 ratio (ref 0.0–3.2)
Triglycerides: 127 mg/dL (ref 0–149)
VLDL Cholesterol Cal: 22 mg/dL (ref 5–40)

## 2021-05-28 LAB — T4, FREE: Free T4: 1.09 ng/dL (ref 0.82–1.77)

## 2021-05-28 LAB — VITAMIN D 25 HYDROXY (VIT D DEFICIENCY, FRACTURES): Vit D, 25-Hydroxy: 60.6 ng/mL (ref 30.0–100.0)

## 2021-05-28 LAB — T3: T3, Total: 81 ng/dL (ref 71–180)

## 2021-05-28 LAB — INSULIN, RANDOM: INSULIN: 6.3 u[IU]/mL (ref 2.6–24.9)

## 2021-05-28 NOTE — Addendum Note (Signed)
Addended by: Dennard Nip D on: 05/28/2021 02:12 PM   Modules accepted: Level of Service

## 2021-05-28 NOTE — Progress Notes (Signed)
Chief Complaint:   OBESITY Kristina [name] (MR# [id number]) is a 75 y.o. female who presents for evaluation and treatment of obesity and related comorbidities. Current BMI is Body mass index is 41.7 kg/m. Kristina [name] has been struggling with her weight for many years and has been unsuccessful in either losing weight, maintaining weight loss, or reaching her healthy weight goal.  Kristina [name] has lost >100 lbs approximately 10 years ago., and she has kept most of this off. She would like to lose another approximately 50 lbs. She started our program right before the pandemic shut down the country, but she is ready to restart again now.  Kristina [name] is currently in the action stage of change and ready to dedicate time achieving and maintaining a healthier weight. Kristina [name] is interested in becoming our patient and working on intensive lifestyle modifications including (but not limited to) diet and exercise for weight loss.  Kristina [name]'s habits were reviewed today and are as follows: Her family eats meals together, her desired weight loss is 68 lbs, she has been heavy most of her life, her heaviest weight ever was 341 pounds, she has significant food cravings issues, she snacks frequently in the evenings, she skips meals frequently, she is frequently drinking liquids with calories, she has problems with excessive hunger, she frequently eats larger portions than normal, and she struggles with emotional eating.  Depression Screen Kristina [name]'s Food and Mood (modified PHQ-9) score was 10.  Depression screen Kristina [name] 2/9 05/27/2021  Decreased Interest 2  Down, Depressed, Hopeless 1  PHQ - 2 Score 3  Altered sleeping 1  Tired, decreased energy 2  Change in appetite 1  Feeling bad or failure about yourself  1  Trouble concentrating 1  Moving slowly or fidgety/restless 1  Suicidal thoughts 0  PHQ-9 Score 10  Difficult doing work/chores Not difficult at all   Subjective:   1. Other fatigue Kristina [name]  admits to daytime somnolence and admits to waking up still tired. Patent has a history of symptoms of daytime fatigue. Kristina [name] generally gets 6 or 7 hours of sleep per night, and states that she has generally restful sleep. Snoring is present. Apneic episodes are not present. Epworth Sleepiness Score is 8.  2. SOB (shortness of breath) on exertion Kristina [name] notes increasing shortness of breath with exercising and seems to be worsening over time with weight gain. She notes getting out of breath sooner with activity than she used to. This has not gotten worse recently. Kristina [name] denies shortness of breath at rest or orthopnea.  3. Essential hypertension Kristina [name]'s blood pressure is stable. She is working on diet, exercise, and weight loss.  4. Hyperglycemia Kristina [name] has a history of some elevated glucose readings. She has no recent labs.  5. Vitamin D deficiency Kristina [name] has a history of Vit D deficiency. She is due to have her level rechecked, and she notes fatigue.  6. Other hyperlipidemia Kristina [name] has a history of hyperlipidemia, and she is due for labs. She denies chest pain. She did have a carotid artery blockage and endarterectomy previously.  Assessment/Plan:   1. Other fatigue Kristina [name] does feel that her weight is causing her energy to be lower than it should be. Fatigue may be related to obesity, depression or many other causes. Labs will be ordered, and in the meanwhile, Kristina [name] will focus on self care including making healthy food choices, increasing physical activity and focusing on stress reduction.  - TSH - Lipid Panel With LDL/HDL Ratio - T3 - CBC With  Differential - EKG 12-Lead - T4, free  2. SOB (shortness of breath) on exertion Kristina [name] does feel that she gets out of breath more easily that she used to when she exercises. Kristina [name]'s shortness of breath appears to be obesity related and exercise induced. She has agreed to work on weight loss and gradually  increase exercise to treat her exercise induced shortness of breath. Will continue to monitor closely.  3. Essential hypertension Kristina [name] will start her Category 2 plan, and will continue to work on exercise and weight loss to improve blood pressure control. We will watch for signs of hypotension as she continues her lifestyle modifications.  4. Hyperglycemia Fasting labs will be obtained and results with be discussed with Kristina [name] in 2 weeks at her follow up visit. In the meanwhile Kristina [name] was started on her Category 2 plan and will work on weight loss efforts.  - Insulin, random - Comprehensive metabolic panel - Hemoglobin A1c  5. Vitamin D deficiency Low Vitamin D level contributes to fatigue and are associated with obesity, breast, and colon cancer. We will check labs today. Kristina [name] will follow-up for routine testing of Vitamin D, at least 2-3 times per year to avoid over-replacement.  - VITAMIN D 25 Hydroxy (Vit-D Deficiency, Fractures)  6. Other hyperlipidemia Cardiovascular risk and specific lipid/LDL goals reviewed. We discussed several lifestyle modifications today. Kristina [name] will start her Category 2 plan, and will continue to work on diet, exercise and weight loss efforts. We will check labs today. Orders and follow up as documented in patient record.   - Lipid Panel With LDL/HDL Ratio  7. Screening for depression Kristina [name] had a positive depression screening. Depression is commonly associated with obesity and often results in emotional eating behaviors. We will monitor this closely and work on CBT to help improve the non-hunger eating patterns. Referral to Psychology may be required if no improvement is seen as she continues in our clinic.  8. Obesity with current BMI 41.8 Kristina [name] is currently in the action stage of change and her goal is to continue with weight loss efforts. I recommend Kristina [name] begin the structured treatment plan as follows:  She has agreed to the  Category 2 Plan.  Exercise goals: For substantial health benefits, adults should do at least 150 minutes (2 hours and 30 minutes) a week of moderate-intensity, or 75 minutes (1 hour and 15 minutes) a week of vigorous-intensity aerobic physical activity, or an equivalent combination of moderate- and vigorous-intensity aerobic activity. Aerobic activity should be performed in episodes of at least 10 minutes, and preferably, it should be spread throughout the week.   Behavioral modification strategies: increasing lean protein intake.  She was informed of the importance of frequent follow-up visits to maximize her success with intensive lifestyle modifications for her multiple health conditions. She was informed we would discuss her lab results at her next visit unless there is a critical issue that needs to be addressed sooner. Kristina [name] agreed to keep her next visit at the agreed upon time to discuss these results.  Objective:   Blood pressure 137/74, pulse (!) 52, temperature 98.1 F (36.7 C), height '5\' 2"'$  (1.575 m), weight 228 lb (103.4 kg), SpO2 96 %. Body mass index is 41.7 kg/m.  EKG: Normal sinus rhythm, rate 55 BPM.  Indirect Calorimeter completed today shows a VO2 of 231 and a REE of 1598.  Her calculated basal metabolic rate is [id number] thus her basal metabolic rate is better than expected.  General: Cooperative, alert, well developed, in  no acute distress. HEENT: Conjunctivae and lids unremarkable. Cardiovascular: Regular rhythm.  Lungs: Normal work of breathing. Neurologic: No focal deficits.   Lab Results  Component Value Date   CREATININE 0.58 05/27/2021   BUN 21 05/27/2021   NA 140 05/27/2021   K 4.4 05/27/2021   CL 98 05/27/2021   CO2 29 05/27/2021   Lab Results  Component Value Date   ALT 20 05/27/2021   AST 21 05/27/2021   ALKPHOS 69 05/27/2021   BILITOT 0.4 05/27/2021   Lab Results  Component Value Date   HGBA1C 5.7 (H) 05/27/2021   HGBA1C 5.5 12/15/2018   Lab  Results  Component Value Date   INSULIN 6.3 05/27/2021   INSULIN 7.1 12/15/2018   Lab Results  Component Value Date   TSH 2.290 05/27/2021   Lab Results  Component Value Date   CHOL 183 05/27/2021   HDL 68 05/27/2021   LDLCALC 93 05/27/2021   TRIG 127 05/27/2021   CHOLHDL 2.5 12/22/2019   Lab Results  Component Value Date   WBC 6.4 05/27/2021   HGB 11.8 05/27/2021   HCT 39.1 05/27/2021   MCV 68 (L) 05/27/2021   PLT 233 09/28/2019   No results found for: IRON, TIBC, FERRITIN  Attestation Statements:   Reviewed by clinician on day of visit: allergies, medications, problem list, medical history, surgical history, family history, social history, and previous encounter notes.   I, Trixie Dredge, am acting as transcriptionist for Dennard Nip, MD.  I have reviewed the above documentation for accuracy and completeness, and I agree with the above. - Dennard Nip, MD

## 2021-06-02 DIAGNOSIS — Z8673 Personal history of transient ischemic attack (TIA), and cerebral infarction without residual deficits: Secondary | ICD-10-CM | POA: Diagnosis not present

## 2021-06-02 DIAGNOSIS — I10 Essential (primary) hypertension: Secondary | ICD-10-CM | POA: Diagnosis not present

## 2021-06-02 DIAGNOSIS — R7303 Prediabetes: Secondary | ICD-10-CM | POA: Diagnosis not present

## 2021-06-02 DIAGNOSIS — K635 Polyp of colon: Secondary | ICD-10-CM | POA: Diagnosis not present

## 2021-06-02 DIAGNOSIS — E7800 Pure hypercholesterolemia, unspecified: Secondary | ICD-10-CM | POA: Diagnosis not present

## 2021-06-02 DIAGNOSIS — I739 Peripheral vascular disease, unspecified: Secondary | ICD-10-CM | POA: Diagnosis not present

## 2021-06-02 DIAGNOSIS — Z780 Asymptomatic menopausal state: Secondary | ICD-10-CM | POA: Diagnosis not present

## 2021-06-02 DIAGNOSIS — Z0000 Encounter for general adult medical examination without abnormal findings: Secondary | ICD-10-CM | POA: Diagnosis not present

## 2021-06-04 ENCOUNTER — Other Ambulatory Visit: Payer: Self-pay | Admitting: Family Medicine

## 2021-06-04 DIAGNOSIS — E2839 Other primary ovarian failure: Secondary | ICD-10-CM

## 2021-06-06 DIAGNOSIS — H40053 Ocular hypertension, bilateral: Secondary | ICD-10-CM | POA: Diagnosis not present

## 2021-06-06 DIAGNOSIS — H2513 Age-related nuclear cataract, bilateral: Secondary | ICD-10-CM | POA: Diagnosis not present

## 2021-06-10 ENCOUNTER — Encounter (INDEPENDENT_AMBULATORY_CARE_PROVIDER_SITE_OTHER): Payer: Self-pay | Admitting: Family Medicine

## 2021-06-10 ENCOUNTER — Other Ambulatory Visit: Payer: Self-pay

## 2021-06-10 ENCOUNTER — Ambulatory Visit (INDEPENDENT_AMBULATORY_CARE_PROVIDER_SITE_OTHER): Payer: Medicare Other | Admitting: Family Medicine

## 2021-06-10 VITALS — BP 137/56 | HR 54 | Temp 98.2°F | Ht 62.0 in | Wt 222.0 lb

## 2021-06-10 DIAGNOSIS — Z6841 Body Mass Index (BMI) 40.0 and over, adult: Secondary | ICD-10-CM

## 2021-06-10 DIAGNOSIS — E559 Vitamin D deficiency, unspecified: Secondary | ICD-10-CM

## 2021-06-10 DIAGNOSIS — R7303 Prediabetes: Secondary | ICD-10-CM | POA: Diagnosis not present

## 2021-06-10 DIAGNOSIS — E7849 Other hyperlipidemia: Secondary | ICD-10-CM

## 2021-06-10 NOTE — Progress Notes (Signed)
Kristina [name] is at higher than average risk for developing diabetes due to obesity.

## 2021-06-10 NOTE — Progress Notes (Signed)
Chief Complaint:   OBESITY Kristina [name] is here to discuss her progress with her obesity treatment plan along with follow-up of her obesity related diagnoses. Kristina [name] is on the Category 2 Plan and states she is following her eating plan approximately 100% of the time. Kristina [name] states she is walking and at the gym doing cardio and weights for 30-45 minutes 5 times per week.  Today's visit was #: 2 Starting weight: 228 lbs Starting date: 05/27/2021 Today's weight: 222 lbs Today's date: 06/10/2021 Total lbs lost to date: 6 Total lbs lost since last in-office visit: 6  Interim History: Caedyn did very well with weight loss on her Category 2 plan. Her hunger is mostly controlled. She journaled her food and her calories were a bit lower than ideal.  Subjective:   1. Pre-diabetes Kristina [name]'s fasting glucose and A1c are elevated. She is not on medications and she is trying to avoid diabetes mellitus. She has done well on her plan. I discussed labs with the patient today.  2. Vitamin D deficiency Kristina [name]'s Vit D level is at goal. She has no signs of over-replacement. I discussed labs with the patient today.  3. Other hyperlipidemia Kristina [name]'s blood pressure is well controlled on her medications. She is doing well with diet and weight loss. She denies chest pain. I discussed labs with the patient today.  Assessment/Plan:   1. Pre-diabetes Ellsie deferred metformin, and she will continue with diet, exercise, and decreasing simple carbohydrates to help decrease the risk of diabetes. We will recheck labs in 3 months.  2. Vitamin D deficiency Low Vitamin D level contributes to fatigue and are associated with obesity, breast, and colon cancer. Kristina [name] will continue prescription Vitamin D as is, and she will follow-up for routine testing of Vitamin D, at least 2-3 times per year to avoid over-replacement.  3. Other hyperlipidemia Cardiovascular risk and specific lipid/LDL goals  reviewed. We discussed several lifestyle modifications today. [name] will continue to work on diet, exercise and weight loss efforts. We will recheck labs in 3 months. Orders and follow up as documented in patient record.   4. Obesity with current BMI 40.7  Kristina [name] is currently in the action stage of change. As such, her goal is to continue with weight loss efforts. She has agreed to change to keeping a food journal and adhering to recommended goals of 1100-1200 calories and 70+ grams of protein daily.   Exercise goals: As is.  Behavioral modification strategies: increasing lean protein intake.  Kristina [name] has agreed to follow-up with our clinic in 2 to 3 weeks. She was informed of the importance of frequent follow-up visits to maximize her success with intensive lifestyle modifications for her multiple health conditions.   Objective:   Blood pressure (!) 137/56, pulse (!) 54, temperature 98.2 F (36.8 C), height '5\' 2"'$  (1.575 m), weight 222 lb (100.7 kg), SpO2 96 %. Body mass index is 40.6 kg/m.  General: Cooperative, alert, well developed, in no acute distress. HEENT: Conjunctivae and lids unremarkable. Cardiovascular: Regular rhythm.  Lungs: Normal work of breathing. Neurologic: No focal deficits.   Lab Results  Component Value Date   CREATININE 0.58 05/27/2021   BUN 21 05/27/2021   NA 140 05/27/2021   K 4.4 05/27/2021   CL 98 05/27/2021   CO2 29 05/27/2021   Lab Results  Component Value Date   ALT 20 05/27/2021   AST 21 05/27/2021   ALKPHOS 69 05/27/2021   BILITOT 0.4 05/27/2021   Lab Results  Component Value Date   HGBA1C 5.7 (H) 05/27/2021   HGBA1C 5.5 12/15/2018   Lab Results  Component Value Date   INSULIN 6.3 05/27/2021   INSULIN 7.1 12/15/2018   Lab Results  Component Value Date   TSH 2.290 05/27/2021   Lab Results  Component Value Date   CHOL 183 05/27/2021   HDL 68 05/27/2021   LDLCALC 93 05/27/2021   TRIG 127 05/27/2021   CHOLHDL 2.5  12/22/2019   Lab Results  Component Value Date   VD25OH 60.6 05/27/2021   VD25OH 31.8 12/15/2018   Lab Results  Component Value Date   WBC 6.4 05/27/2021   HGB 11.8 05/27/2021   HCT 39.1 05/27/2021   MCV 68 (L) 05/27/2021   PLT 233 09/28/2019   No results found for: IRON, TIBC, FERRITIN  Attestation Statements:   Reviewed by clinician on day of visit: allergies, medications, problem list, medical history, surgical history, family history, social history, and previous encounter notes.  Time spent on visit including pre-visit chart review and post-visit care and charting was 50 minutes.    I, Trixie Dredge, am acting as transcriptionist for Dennard Nip, MD.  I have reviewed the above documentation for accuracy and completeness, and I agree with the above. -  Dennard Nip, MD

## 2021-06-12 DIAGNOSIS — E7800 Pure hypercholesterolemia, unspecified: Secondary | ICD-10-CM | POA: Diagnosis not present

## 2021-06-12 DIAGNOSIS — I10 Essential (primary) hypertension: Secondary | ICD-10-CM | POA: Diagnosis not present

## 2021-06-12 DIAGNOSIS — M158 Other polyosteoarthritis: Secondary | ICD-10-CM | POA: Diagnosis not present

## 2021-06-30 ENCOUNTER — Other Ambulatory Visit: Payer: Self-pay | Admitting: *Deleted

## 2021-06-30 DIAGNOSIS — I6523 Occlusion and stenosis of bilateral carotid arteries: Secondary | ICD-10-CM

## 2021-07-02 ENCOUNTER — Ambulatory Visit (INDEPENDENT_AMBULATORY_CARE_PROVIDER_SITE_OTHER): Payer: Medicare Other | Admitting: Family Medicine

## 2021-07-02 ENCOUNTER — Other Ambulatory Visit: Payer: Self-pay

## 2021-07-02 ENCOUNTER — Encounter (INDEPENDENT_AMBULATORY_CARE_PROVIDER_SITE_OTHER): Payer: Self-pay | Admitting: Family Medicine

## 2021-07-02 VITALS — BP 126/58 | HR 66 | Temp 97.7°F | Ht 62.0 in | Wt 216.0 lb

## 2021-07-02 DIAGNOSIS — E66812 Obesity, class 2: Secondary | ICD-10-CM

## 2021-07-02 DIAGNOSIS — R7303 Prediabetes: Secondary | ICD-10-CM | POA: Diagnosis not present

## 2021-07-02 DIAGNOSIS — E7849 Other hyperlipidemia: Secondary | ICD-10-CM

## 2021-07-02 DIAGNOSIS — Z6839 Body mass index (BMI) 39.0-39.9, adult: Secondary | ICD-10-CM

## 2021-07-02 NOTE — Progress Notes (Signed)
Chief Complaint:   OBESITY Kristina [name] is here to discuss her progress with her obesity treatment plan along with follow-up of her obesity related diagnoses. Kristina [name] is on keeping a food journal and adhering to recommended goals of 1100-1200 calories and 70+ grams of protein daily and states she is following her eating plan approximately 96% of the time. Kristina [name] states she is walking the dog daily.   Today's visit was #: 3 Starting weight: 228 lbs Starting date: 05/27/2021 Today's weight: 216 lbs Today's date: 07/02/2021 Total lbs lost to date: 12 Total lbs lost since last in-office visit: 6  Interim History: Kristina [name] continues to do well with weight loss. She had some extra challenges since her last visit. She did very well with staying on her eating plan and exercising.  Subjective:   1. Other hyperlipidemia Kristina [name] is stable on pravastatin. She is doing well with diet and weight loss. She may do well enough that her medication could be reduced.  2. Pre-diabetes Kristina [name] continues to do well with weight loss. She notes decreased polyphagia. She is not on metformin.  Assessment/Plan:   1. Other hyperlipidemia Cardiovascular risk and specific lipid/LDL goals reviewed. We discussed several lifestyle modifications today. Kristina [name] will continue to work on diet, exercise, and weight loss efforts. We will recheck lab in 2 months. Orders and follow up as documented in patient record.   2. Pre-diabetes Kristina [name] will continue with diet, exercise, and decreasing simple carbohydrates to help decrease the risk of diabetes. We will recheck labs in 2 months.  3. Obesity with current BMI of 39.6 Kristina [name] is currently in the action stage of change. As such, her goal is to continue with weight loss efforts. She has agreed to the Category 2 Plan or keeping a food journal and adhering to recommended goals of 1100-1200 calories and 70+ grams of protein daily.   Exercise goals: As  is.  Behavioral modification strategies: increasing lean protein intake and meal planning and cooking strategies.  Kristina [name] has agreed to follow-up with our clinic in 3 weeks. She was informed of the importance of frequent follow-up visits to maximize her success with intensive lifestyle modifications for her multiple health conditions.   Objective:   Blood pressure (!) 126/58, pulse 66, temperature 97.7 F (36.5 C), height 5\' 2"  (1.575 m), weight 216 lb (98 kg), SpO2 96 %. Body mass index is 39.51 kg/m.  General: Cooperative, alert, well developed, in no acute distress. HEENT: Conjunctivae and lids unremarkable. Cardiovascular: Regular rhythm.  Lungs: Normal work of breathing. Neurologic: No focal deficits.   Lab Results  Component Value Date   CREATININE 0.58 05/27/2021   BUN 21 05/27/2021   NA 140 05/27/2021   K 4.4 05/27/2021   CL 98 05/27/2021   CO2 29 05/27/2021   Lab Results  Component Value Date   ALT 20 05/27/2021   AST 21 05/27/2021   ALKPHOS 69 05/27/2021   BILITOT 0.4 05/27/2021   Lab Results  Component Value Date   HGBA1C 5.7 (H) 05/27/2021   HGBA1C 5.5 12/15/2018   Lab Results  Component Value Date   INSULIN 6.3 05/27/2021   INSULIN 7.1 12/15/2018   Lab Results  Component Value Date   TSH 2.290 05/27/2021   Lab Results  Component Value Date   CHOL 183 05/27/2021   HDL 68 05/27/2021   LDLCALC 93 05/27/2021   TRIG 127 05/27/2021   CHOLHDL 2.5 12/22/2019   Lab Results  Component Value Date   VD25OH 60.6  05/27/2021   VD25OH 31.8 12/15/2018   Lab Results  Component Value Date   WBC 6.4 05/27/2021   HGB 11.8 05/27/2021   HCT 39.1 05/27/2021   MCV 68 (L) 05/27/2021   PLT 233 09/28/2019   No results found for: IRON, TIBC, FERRITIN  Attestation Statements:   Reviewed by clinician on day of visit: allergies, medications, problem list, medical history, surgical history, family history, social history, and previous encounter notes.  Time  spent on visit including pre-visit chart review and post-visit care and charting was 32 minutes.    I, Trixie Dredge, am acting as transcriptionist for Dennard Nip, MD.  I have reviewed the above documentation for accuracy and completeness, and I agree with the above. -  Dennard Nip, MD

## 2021-07-07 ENCOUNTER — Encounter: Payer: Self-pay | Admitting: Surgery

## 2021-07-07 ENCOUNTER — Other Ambulatory Visit: Payer: Self-pay | Admitting: Family Medicine

## 2021-07-07 ENCOUNTER — Ambulatory Visit (INDEPENDENT_AMBULATORY_CARE_PROVIDER_SITE_OTHER): Payer: Medicare Other | Admitting: Surgery

## 2021-07-07 ENCOUNTER — Other Ambulatory Visit: Payer: Self-pay

## 2021-07-07 ENCOUNTER — Ambulatory Visit (HOSPITAL_COMMUNITY)
Admission: RE | Admit: 2021-07-07 | Discharge: 2021-07-07 | Disposition: A | Discharge status: Home / Self Care | Payer: Medicare Other | Source: Ambulatory Visit | Attending: Surgery | Admitting: Surgery

## 2021-07-07 VITALS — BP 112/80 | HR 53 | Temp 98.0°F | Resp 20 | Ht 62.0 in | Wt 225.0 lb

## 2021-07-07 DIAGNOSIS — I6523 Occlusion and stenosis of bilateral carotid arteries: Secondary | ICD-10-CM | POA: Insufficient documentation

## 2021-07-07 DIAGNOSIS — Z1231 Encounter for screening mammogram for malignant neoplasm of breast: Secondary | ICD-10-CM

## 2021-07-07 NOTE — Progress Notes (Signed)
[facility]ascular [facility]in [facility]ist of   Patient name: Kristina [name] MRN: [id number] DOB: August 02, 1946 Sex: female   REASON FOR VISIT:    Follow up  [place]:   Kristina [name] is a 75 y.o. female who presented with left eye amaurosis.  Carotid duplex indicated greater than 80% stenosis.  On 09/27/2019 she underwent left carotid endarterectomy with patch angioplasty.  Intraoperative findings included a 90% stenosis with a exophytic plaque in the proximal internal carotid artery.  Her postoperative course was uncomplicated.  In April 2022 she presented with an abnormal screening ABI.  She was not having claudication, rest pain or nonhealing wounds.  She takes a statin for hypercholesterolemia.  She is on a daily aspirin.  Her blood pressure is managed with an ARB.  She is a former smoker having quit in 2009 PAST MEDICAL HISTORY:   Past Medical History:  Diagnosis Date   Allergic rhinitis    Anemia glaucoma   Anxiety    Back pain    Cataract    Mixed OU   DDD (degenerative disc disease), lumbar    High blood pressure    High cholesterol    History of stomach ulcers    Hypertensive retinopathy    OU   Rheumatic fever    Rheumatic fever    Rheumatoid arteritis ([place])      FAMILY HISTORY:   Family History  Problem Relation Age of Onset   Kidney disease Mother    Hypertension Mother    Glaucoma Mother    Heart failure Father    Emphysema Father    Asthma Father    Hypertension Father    Heart disease Father    COPD Brother    Allergic rhinitis Neg Hx    Angioedema Neg Hx    Atopy Neg Hx    Eczema Neg Hx    Immunodeficiency Neg Hx    Urticaria Neg Hx     SOCIAL HISTORY:   Social History   Tobacco Use   Smoking status: Former    Packs/day: 1.50    Years: 30.00    Pack years: 45.00    Types: Cigarettes    Quit date: 2009    Years since quitting: 13.7   Smokeless tobacco: Never  Substance Use  Topics   Alcohol use: Yes    Comment: social     ALLERGIES:   Allergies  Allergen Reactions   Penicillins Anaphylaxis and Itching    Did it involve swelling of the face/tongue/throat, SOB, or low BP? Yes Did it involve sudden or severe rash/hives, skin peeling, or any reaction on the inside of your mouth or nose? No Did you need to seek medical attention at a hospital or doctor's office? Yes When did it last happen?      Childhood allergy If all above answers are "NO", may proceed with cephalosporin use.    Mite (D. Farinae)     Other reaction(s): sinus swelling     CURRENT MEDICATIONS:   Current Outpatient Medications  Medication Sig Dispense Refill   acetaminophen (TYLENOL) 325 MG tablet Take 1-2 tablets (325-650 mg total) by mouth every 6 (six) hours as needed for mild pain (or temp >/= 101 F).     aspirin EC 81 MG tablet Take 81 mg by mouth daily.     dorzolamide-timolol (COSOPT) 22.3-6.8 MG/ML ophthalmic solution Place 1 drop into both eyes 2 (two) times daily. 10 mL 4   ELDERBERRY PO Take 2 capsules by  mouth daily.     escitalopram (LEXAPRO) 10 MG tablet Take 10 mg by mouth daily.     GLUCOSAMINE-CHONDROITIN PO Take 1 tablet by mouth 2 (two) times daily.      hydrochlorothiazide (HYDRODIURIL) 25 MG tablet Take 25 mg by mouth daily.      losartan (COZAAR) 100 MG tablet Take 100 mg by mouth daily.     MELATONIN ER PO Melatonin     montelukast (SINGULAIR) 10 MG tablet TAKE 1 TABLET(10 MG) BY MOUTH AT BEDTIME (Patient taking differently: Take 10 mg by mouth at bedtime.) 30 tablet 0   Multiple Vitamin (MULTIVITAMIN) capsule Take 1 capsule by mouth daily.     Multiple Vitamins-Minerals (AIRBORNE GUMMIES PO) Take 3 tablets by mouth daily.      terbinafine (LAMISIL) 250 MG tablet 1 tablet     Vitamin D, Ergocalciferol, (DRISDOL) 1.25 MG (50000 UT) CAPS capsule Take 1 capsule (50,000 Units total) by mouth every 7 (seven) days. (Patient taking differently: Take 50,000 Units by  mouth every Monday.) 4 capsule 0   pravastatin (PRAVACHOL) 40 MG tablet Take 1 tablet (40 mg total) by mouth every evening. (Patient taking differently: Take 40 mg by mouth daily.) 90 tablet 3   No current facility-administered medications for this visit.    REVIEW OF SYSTEMS:   [X]  denotes positive finding, [ ]  denotes negative finding Cardiac  Comments:  Chest pain or chest pressure:    Shortness of breath upon exertion:    Short of breath when lying flat:    Irregular heart rhythm:        Vascular    Pain in calf, thigh, or hip brought on by ambulation:    Pain in feet at night that wakes you up from your sleep:     Blood clot in your veins:    Leg swelling:         Pulmonary    Oxygen at home:    Productive cough:     Wheezing:         Neurologic    Sudden weakness in arms or legs:     Sudden numbness in arms or legs:     Sudden onset of difficulty speaking or slurred speech:    Temporary loss of vision in one eye:     Problems with dizziness:         Gastrointestinal    Blood in stool:     Vomited blood:         Genitourinary    Burning when urinating:     Blood in urine:        Psychiatric    Major depression:         Hematologic    Bleeding problems:    Problems with blood clotting too easily:        Skin    Rashes or ulcers:        Constitutional    Fever or chills:      PHYSICAL EXAM:   Vitals:   07/07/21 1152 07/07/21 1155  BP: 138/64 112/80  Pulse: (!) 53   Resp: 20   Temp: 98 F (36.7 C)   SpO2: 95%   Weight: 225 lb (102.1 kg)   Height: 5\' 2"  (1.575 m)     GENERAL: The patient is a well-nourished female, in no acute distress. The vital signs are documented above. CARDIAC: There is a regular rate and rhythm.  PULMONARY: Non-labored respirations MUSCULOSKELETAL: There are no major deformities or  cyanosis. NEUROLOGIC: No focal weakness or paresthesias are detected. SKIN: There are no ulcers or rashes noted. PSYCHIATRIC: The patient  has a normal affect.  STUDIES:   I have reviewed her vascular lab studies with the following findings: Right Carotid: Velocities in the right ICA are consistent with a 80-99%                 stenosis.   Left Carotid: Velocities in the left ICA are consistent with a 1-39%  stenosis.   Vertebrals:  Bilateral vertebral arteries demonstrate antegrade flow.  Subclavians: Normal flow hemodynamics were seen in bilateral subclavian               arteries.  MEDICAL ISSUES:   Carotid stenosis: There has been a significant interval change in the stenosis on the right side, now greater than 80%.  6 months ago it was in the 40 dose 59% category.  Fortunately she remains asymptomatic.  I looked at the lesion with the SonoSite and it does appear to be a high lesion.  Therefore I have recommended getting a CT angiogram to define her anatomy and see if she is a stent candidate and if the lesion is surgically accessible.  I will get the CT angiogram of the neck this week and have her follow-up with me on Monday for plans for revascularization.    Leia Alf, MD, FACS [facility] [phone] Pager [phone]

## 2021-07-08 ENCOUNTER — Ambulatory Visit (HOSPITAL_COMMUNITY)
Admission: RE | Admit: 2021-07-08 | Discharge: 2021-07-08 | Disposition: A | Discharge status: Home / Self Care | Payer: Medicare Other | Source: Ambulatory Visit | Attending: Surgery | Admitting: Surgery

## 2021-07-08 ENCOUNTER — Ambulatory Visit
Admission: RE | Admit: 2021-07-08 | Discharge: 2021-07-08 | Disposition: A | Discharge status: Home / Self Care | Payer: Medicare Other | Source: Ambulatory Visit | Attending: Family Medicine | Admitting: Family Medicine

## 2021-07-08 DIAGNOSIS — I7789 Other specified disorders of arteries and arterioles: Secondary | ICD-10-CM | POA: Diagnosis not present

## 2021-07-08 DIAGNOSIS — M47812 Spondylosis without myelopathy or radiculopathy, cervical region: Secondary | ICD-10-CM | POA: Diagnosis not present

## 2021-07-08 DIAGNOSIS — I6523 Occlusion and stenosis of bilateral carotid arteries: Secondary | ICD-10-CM | POA: Insufficient documentation

## 2021-07-08 DIAGNOSIS — M4312 Spondylolisthesis, cervical region: Secondary | ICD-10-CM | POA: Diagnosis not present

## 2021-07-08 DIAGNOSIS — Z1231 Encounter for screening mammogram for malignant neoplasm of breast: Secondary | ICD-10-CM | POA: Diagnosis not present

## 2021-07-08 DIAGNOSIS — I6521 Occlusion and stenosis of right carotid artery: Secondary | ICD-10-CM | POA: Diagnosis not present

## 2021-07-08 LAB — POCT I-STAT CREATININE: Creatinine, Ser: 0.6 mg/dL (ref 0.44–1.00)

## 2021-07-08 MED ORDER — IOHEXOL 350 MG/ML SOLN
75.0000 mL | Freq: Once | INTRAVENOUS | Status: AC | PRN
  Administered 2021-07-08: 75 mL via INTRAVENOUS

## 2021-07-14 ENCOUNTER — Other Ambulatory Visit: Payer: Self-pay

## 2021-07-14 ENCOUNTER — Encounter: Payer: Self-pay | Admitting: Surgery

## 2021-07-14 ENCOUNTER — Ambulatory Visit: Payer: Medicare Other | Admitting: Surgery

## 2021-07-14 VITALS — BP 138/79 | HR 59 | Temp 97.7°F | Resp 20 | Ht 62.0 in | Wt 224.7 lb

## 2021-07-14 DIAGNOSIS — I6523 Occlusion and stenosis of bilateral carotid arteries: Secondary | ICD-10-CM | POA: Diagnosis not present

## 2021-07-14 NOTE — Progress Notes (Signed)
Vascular and Vein Specialist of [place]  Patient name: Kristina [name] MRN: [id number] DOB: August 03, 1946 Sex: female   REASON FOR VISIT:    Follow up  [place]:   Kristina [name] is a 75 y.o. female who presented with left eye amaurosis.  Carotid duplex indicated greater than 80% stenosis.  On 09/27/2019 she underwent left carotid endarterectomy with patch angioplasty.  Intraoperative findings included a 90% stenosis with a exophytic plaque in the proximal internal carotid artery.  Her postoperative course was uncomplicated.  At her last visit, she had a significant interval change in her right carotid artery which was asymptomatic.  I sent her for CT scan to get a better evaluation of this.  She is back today to discuss the findings:   In April 2022 she presented with an abnormal screening ABI.  She was not having claudication, rest pain or nonhealing wounds.  She takes a statin for hypercholesterolemia.  She is on a daily aspirin.  Her blood pressure is managed with an ARB.  She is a former smoker having quit in 2009  PAST MEDICAL HISTORY:   Past Medical History:  Diagnosis Date   Allergic rhinitis    Anemia glaucoma   Anxiety    Back pain    Cataract    Mixed OU   DDD (degenerative disc disease), lumbar    High blood pressure    High cholesterol    History of stomach ulcers    Hypertensive retinopathy    OU   Rheumatic fever    Rheumatic fever    Rheumatoid arteritis ([place])      FAMILY HISTORY:   Family History  Problem Relation Age of Onset   Kidney disease Mother    Hypertension Mother    Glaucoma Mother    Heart failure Father    Emphysema Father    Asthma Father    Hypertension Father    Heart disease Father    COPD Brother    Allergic rhinitis Neg Hx    Angioedema Neg Hx    Atopy Neg Hx    Eczema Neg Hx    Immunodeficiency Neg Hx    Urticaria Neg Hx     SOCIAL HISTORY:   Social History    Tobacco Use   Smoking status: Former    Packs/day: 1.50    Years: 30.00    Pack years: 45.00    Types: Cigarettes    Quit date: 2009    Years since quitting: 13.7   Smokeless tobacco: Never  Substance Use Topics   Alcohol use: Yes    Comment: social     ALLERGIES:   Allergies  Allergen Reactions   Penicillins Anaphylaxis and Itching    Did it involve swelling of the face/tongue/throat, SOB, or low BP? Yes Did it involve sudden or severe rash/hives, skin peeling, or any reaction on the inside of your mouth or nose? No Did you need to seek medical attention at a hospital or doctor's office? Yes When did it last happen?      Childhood allergy If all above answers are "NO", may proceed with cephalosporin use.    Mite (D. Farinae)     Other reaction(s): sinus swelling     CURRENT MEDICATIONS:   Current Outpatient Medications  Medication Sig Dispense Refill   acetaminophen (TYLENOL) 325 MG tablet Take 1-2 tablets (325-650 mg total) by mouth every 6 (six) hours as needed for mild pain (or temp >/= 101 F).  aspirin EC 81 MG tablet Take 81 mg by mouth daily.     dorzolamide-timolol (COSOPT) 22.3-6.8 MG/ML ophthalmic solution Place 1 drop into both eyes 2 (two) times daily. 10 mL 4   ELDERBERRY PO Take 2 capsules by mouth daily.     escitalopram (LEXAPRO) 10 MG tablet Take 10 mg by mouth daily.     GLUCOSAMINE-CHONDROITIN PO Take 1 tablet by mouth 2 (two) times daily.      hydrochlorothiazide (HYDRODIURIL) 25 MG tablet Take 25 mg by mouth daily.      losartan (COZAAR) 100 MG tablet Take 100 mg by mouth daily.     MELATONIN ER PO Melatonin     montelukast (SINGULAIR) 10 MG tablet TAKE 1 TABLET(10 MG) BY MOUTH AT BEDTIME (Patient taking differently: Take 10 mg by mouth at bedtime.) 30 tablet 0   Multiple Vitamin (MULTIVITAMIN) capsule Take 1 capsule by mouth daily.     Multiple Vitamins-Minerals (AIRBORNE GUMMIES PO) Take 3 tablets by mouth daily.      terbinafine (LAMISIL)  250 MG tablet 1 tablet     Vitamin D, Ergocalciferol, (DRISDOL) 1.25 MG (50000 UT) CAPS capsule Take 1 capsule (50,000 Units total) by mouth every 7 (seven) days. (Patient taking differently: Take 50,000 Units by mouth every Monday.) 4 capsule 0   pravastatin (PRAVACHOL) 40 MG tablet Take 1 tablet (40 mg total) by mouth every evening. (Patient taking differently: Take 40 mg by mouth daily.) 90 tablet 3   No current facility-administered medications for this visit.    REVIEW OF SYSTEMS:   [X]  denotes positive finding, [ ]  denotes negative finding Cardiac  Comments:  Chest pain or chest pressure:    Shortness of breath upon exertion:    Short of breath when lying flat:    Irregular heart rhythm:        Vascular    Pain in calf, thigh, or hip brought on by ambulation:    Pain in feet at night that wakes you up from your sleep:     Blood clot in your veins:    Leg swelling:         Pulmonary    Oxygen at home:    Productive cough:     Wheezing:         Neurologic    Sudden weakness in arms or legs:     Sudden numbness in arms or legs:     Sudden onset of difficulty speaking or slurred speech:    Temporary loss of vision in one eye:     Problems with dizziness:         Gastrointestinal    Blood in stool:     Vomited blood:         Genitourinary    Burning when urinating:     Blood in urine:        Psychiatric    Major depression:         Hematologic    Bleeding problems:    Problems with blood clotting too easily:        Skin    Rashes or ulcers:        Constitutional    Fever or chills:      PHYSICAL EXAM:   Vitals:   07/14/21 0954  BP: 138/79  Pulse: (!) 59  Resp: 20  Temp: 97.7 F (36.5 C)  SpO2: 95%  Weight: 224 lb 11.2 oz (101.9 kg)  Height: 5\' 2"  (1.575 m)    GENERAL:  The patient is a well-nourished female, in no acute distress. The vital signs are documented above. PULMONARY: Non-labored respirations MUSCULOSKELETAL: There are no major  deformities or cyanosis. NEUROLOGIC: No focal weakness or paresthesias are detected. SKIN: There are no ulcers or rashes noted. PSYCHIATRIC: The patient has a normal affect.  STUDIES:   I have reviewed the CT angiogram neck with the following findings: 1. Approximately 35% narrowing of the right proximal internal carotid artery, just distal to the bifurcation, secondary to calcified plaque. This level of stenosis is not considered hemodynamically significant. 2. No other significant stenosis in the neck. 3. Enlargement of main pulmonary artery, which can be seen in setting of pulmonary hypertension. 4.  Aortic Atherosclerosis (ICD10-I70.0).  MEDICAL ISSUES:   Carotid: CT scan shows only 35% right-sided stenosis.  I told the patient that I think this probably is underestimating the lesion, however it is definitely not greater than 80%.  Since she is asymptomatic, I would not recommend anything other than continued medical management.  She will return in 1 year for surveillance ultrasound.    Leia Alf, MD, FACS [facility] [phone] Pager [phone]

## 2021-07-23 ENCOUNTER — Ambulatory Visit (INDEPENDENT_AMBULATORY_CARE_PROVIDER_SITE_OTHER): Payer: Medicare Other | Admitting: Family Medicine

## 2021-07-23 ENCOUNTER — Encounter (INDEPENDENT_AMBULATORY_CARE_PROVIDER_SITE_OTHER): Payer: Self-pay | Admitting: Family Medicine

## 2021-07-23 ENCOUNTER — Other Ambulatory Visit: Payer: Self-pay

## 2021-07-23 VITALS — BP 145/77 | HR 66 | Temp 97.5°F | Ht 62.0 in | Wt 214.0 lb

## 2021-07-23 DIAGNOSIS — R7303 Prediabetes: Secondary | ICD-10-CM

## 2021-07-23 DIAGNOSIS — I10 Essential (primary) hypertension: Secondary | ICD-10-CM | POA: Diagnosis not present

## 2021-07-23 DIAGNOSIS — E7849 Other hyperlipidemia: Secondary | ICD-10-CM | POA: Diagnosis not present

## 2021-07-23 DIAGNOSIS — Z6839 Body mass index (BMI) 39.0-39.9, adult: Secondary | ICD-10-CM

## 2021-07-23 DIAGNOSIS — E559 Vitamin D deficiency, unspecified: Secondary | ICD-10-CM

## 2021-07-23 NOTE — Progress Notes (Signed)
Chief Complaint:   OBESITY Kristina [name] is here to discuss her progress with her obesity treatment plan along with follow-up of her obesity related diagnoses. Kristina [name] is on the Category 2 Plan or keeping a food journal and adhering to recommended goals of 1100-1200 calories and 70+ grams of protein daily and states she is following her eating plan approximately 90% of the time. Kristina [name] states she is walking and doing cardio 5 times per week.  Today's visit was #: 4 Starting weight: 228 lbs Starting date: 05/27/2021 Today's weight: 214 lbs Today's date: 07/23/2021 Total lbs lost to date: 14 Total lbs lost since last in-office visit: 2  Interim History: Kristina [name] continues to do well with weight loss despite having extra challenges and celebration eating. She was able to make better choices. Her hunger is mostly controlled and she is doing better with reducing late night snacking.  Subjective:   1. Essential hypertension Kristina [name]'s blood pressure is elevated today, but normally controlled. She denies chest pain or headache. She is doing well with her diet, exercise, and weight loss.  2. Vitamin D deficiency Kristina [name] is on Vit D prescription, and she is due for labs.  3. Other hyperlipidemia Kristina [name] is on pravastatin, and she is working on her diet and exercise. She is due for labs, and she denies chest pain or myalgias.  4. Pre-diabetes Kristina [name] is doing well with weight loss. She is due to have labs rechecked.  Assessment/Plan:   1. Essential hypertension Kristina [name] will continue with her diet, exercise, and medications to improve blood pressure control. We will recheck her blood pressure in 2 weeks as she continues her lifestyle modifications.  2. Vitamin D deficiency Low Vitamin D level contributes to fatigue and are associated with obesity, breast, and colon cancer. We will check labs today and Kristina [name] will follow-up for routine testing of Vitamin D, at least 2-3  times per year to avoid over-replacement.  - VITAMIN D 25 Hydroxy (Vit-D Deficiency, Fractures)  3. Other hyperlipidemia Cardiovascular risk and specific lipid/LDL goals reviewed. We discussed several lifestyle modifications today. We will refill Kristina [name] will continue to work on diet, exercise and weight loss efforts. Orders and follow up as documented in patient record.   - Lipid Panel With LDL/HDL Ratio  4. Pre-diabetes Kristina [name] will continue to work on diet, exercise, and decreasing simple carbohydrates to help decrease the risk of diabetes. We will check labs today.  - CMP14+EGFR - Insulin, random - Hemoglobin A1c  5. Obesity with current BMI of 39.1 Kristina [name] is currently in the action stage of change. As such, her goal is to continue with weight loss efforts. She has agreed to the Category 2 Plan or keeping a food journal and adhering to recommended goals of 1100 calories and 70+ grams of protein daily.   Protein rich recipes were discussed and ways to keep meals interesting but easy were given to the patient.  Exercise goals: As is.  Behavioral modification strategies: increasing lean protein intake and better snacking choices.  Kristina [name] has agreed to follow-up with our clinic in 2 to 3 weeks. She was informed of the importance of frequent follow-up visits to maximize her success with intensive lifestyle modifications for her multiple health conditions.   Kristina [name] was informed we would discuss her lab results at her next visit unless there is a critical issue that needs to be addressed sooner. Kristina [name] agreed to keep her next visit at the agreed upon time to discuss these results.  Objective:  Blood pressure (!) 145/77, pulse 66, temperature (!) 97.5 F (36.4 C), height 5' 2"  (1.575 m), weight 214 lb (97.1 kg), SpO2 95 %. Body mass index is 39.14 kg/m.  General: Cooperative, alert, well developed, in no acute distress. HEENT: Conjunctivae and lids  unremarkable. Cardiovascular: Regular rhythm.  Lungs: Normal work of breathing. Neurologic: No focal deficits.   Lab Results  Component Value Date   CREATININE 0.60 07/08/2021   BUN 21 05/27/2021   NA 140 05/27/2021   K 4.4 05/27/2021   CL 98 05/27/2021   CO2 29 05/27/2021   Lab Results  Component Value Date   ALT 20 05/27/2021   AST 21 05/27/2021   ALKPHOS 69 05/27/2021   BILITOT 0.4 05/27/2021   Lab Results  Component Value Date   HGBA1C 5.7 (H) 05/27/2021   HGBA1C 5.5 12/15/2018   Lab Results  Component Value Date   INSULIN 6.3 05/27/2021   INSULIN 7.1 12/15/2018   Lab Results  Component Value Date   TSH 2.290 05/27/2021   Lab Results  Component Value Date   CHOL 183 05/27/2021   HDL 68 05/27/2021   LDLCALC 93 05/27/2021   TRIG 127 05/27/2021   CHOLHDL 2.5 12/22/2019   Lab Results  Component Value Date   VD25OH 60.6 05/27/2021   VD25OH 31.8 12/15/2018   Lab Results  Component Value Date   WBC 6.4 05/27/2021   HGB 11.8 05/27/2021   HCT 39.1 05/27/2021   MCV 68 (L) 05/27/2021   PLT 233 09/28/2019   No results found for: IRON, TIBC, FERRITIN  Attestation Statements:   Reviewed by clinician on day of visit: allergies, medications, problem list, medical history, surgical history, family history, social history, and previous encounter notes.  Time spent on visit including pre-visit chart review and post-visit care and charting was 30 minutes.    I, Trixie Dredge, am acting as transcriptionist for Dennard Nip, MD.  I have reviewed the above documentation for accuracy and completeness, and I agree with the above. -  Dennard Nip, MD

## 2021-08-06 ENCOUNTER — Ambulatory Visit (INDEPENDENT_AMBULATORY_CARE_PROVIDER_SITE_OTHER): Payer: Medicare Other | Admitting: Family Medicine

## 2021-08-06 ENCOUNTER — Other Ambulatory Visit: Payer: Self-pay

## 2021-08-06 ENCOUNTER — Encounter (INDEPENDENT_AMBULATORY_CARE_PROVIDER_SITE_OTHER): Payer: Self-pay | Admitting: Family Medicine

## 2021-08-06 VITALS — BP 149/82 | HR 56 | Temp 97.4°F | Ht 62.0 in | Wt 215.0 lb

## 2021-08-06 DIAGNOSIS — I10 Essential (primary) hypertension: Secondary | ICD-10-CM | POA: Diagnosis not present

## 2021-08-06 DIAGNOSIS — Z6841 Body Mass Index (BMI) 40.0 and over, adult: Secondary | ICD-10-CM | POA: Diagnosis not present

## 2021-08-06 NOTE — Progress Notes (Signed)
Chief Complaint:   OBESITY Kristina [name] is here to discuss her progress with her obesity treatment plan along with follow-up of her obesity related diagnoses. Kristina [name] is on the Category 2 Plan or keeping a food journal and adhering to recommended goals of 1100 calories and 70+ grams of protein daily and states she is following her eating plan approximately 95% of the time. Kristina [name] states she is walking for 35-40 minutes 5 times per week.  Today's visit was #: 5 Starting weight: 228 lbs Starting date: 05/27/2021 Today's weight: 215 lbs Today's date: 08/06/2021 Total lbs lost to date: 13 Total lbs lost since last in-office visit: 0  Interim History: Kristina [name] was off of her normal routine and she is retaining some fluid today. She has actually done well with her eating plan despite the extra challenges she has had.  Subjective:   1. Essential hypertension Kristina [name]'s blood pressure is elevated today. She didn't take her medications yet this morning. She denies chest pain and her blood pressure is normally controlled.  Assessment/Plan:   1. Essential hypertension Kristina [name] to continue with diet, exercise, and her medications to improve blood pressure control. We will recheck her blood pressure in 4 weeks.  2. Obesity BMI today 43 Kristina [name] is currently in the action stage of change. As such, her goal is to continue with weight loss efforts. She has agreed to the Category 2 Plan.   Exercise goals: As is.  Behavioral modification strategies: increasing lean protein intake, travel eating strategies, and holiday eating strategies .  Kristina [name] has agreed to follow-up with our clinic in 4 weeks. She was informed of the importance of frequent follow-up visits to maximize her success with intensive lifestyle modifications for her multiple health conditions.   Objective:   Blood pressure (!) 149/82, pulse (!) 56, temperature (!) 97.4 F (36.3 C), height 5\' 2"  (1.575 m), weight 215  lb (97.5 kg), SpO2 97 %. Body mass index is 39.32 kg/m.  General: Cooperative, alert, well developed, in no acute distress. HEENT: Conjunctivae and lids unremarkable. Cardiovascular: Regular rhythm.  Lungs: Normal work of breathing. Neurologic: No focal deficits.   Lab Results  Component Value Date   CREATININE 0.60 07/08/2021   BUN 21 05/27/2021   NA 140 05/27/2021   K 4.4 05/27/2021   CL 98 05/27/2021   CO2 29 05/27/2021   Lab Results  Component Value Date   ALT 20 05/27/2021   AST 21 05/27/2021   ALKPHOS 69 05/27/2021   BILITOT 0.4 05/27/2021   Lab Results  Component Value Date   HGBA1C 5.7 (H) 05/27/2021   HGBA1C 5.5 12/15/2018   Lab Results  Component Value Date   INSULIN 6.3 05/27/2021   INSULIN 7.1 12/15/2018   Lab Results  Component Value Date   TSH 2.290 05/27/2021   Lab Results  Component Value Date   CHOL 183 05/27/2021   HDL 68 05/27/2021   LDLCALC 93 05/27/2021   TRIG 127 05/27/2021   CHOLHDL 2.5 12/22/2019   Lab Results  Component Value Date   VD25OH 60.6 05/27/2021   VD25OH 31.8 12/15/2018   Lab Results  Component Value Date   WBC 6.4 05/27/2021   HGB 11.8 05/27/2021   HCT 39.1 05/27/2021   MCV 68 (L) 05/27/2021   PLT 233 09/28/2019   No results found for: IRON, TIBC, FERRITIN  Attestation Statements:   Reviewed by clinician on day of visit: allergies, medications, problem list, medical history, surgical history, family history, social history, and  previous encounter notes.  Time spent on visit including pre-visit chart review and post-visit care and charting was 33 minutes.    I, Trixie Dredge, am acting as transcriptionist for Dennard Nip, MD.  I have reviewed the above documentation for accuracy and completeness, and I agree with the above. -  Dennard Nip, MD

## 2021-08-14 DIAGNOSIS — M158 Other polyosteoarthritis: Secondary | ICD-10-CM | POA: Diagnosis not present

## 2021-08-14 DIAGNOSIS — E7800 Pure hypercholesterolemia, unspecified: Secondary | ICD-10-CM | POA: Diagnosis not present

## 2021-08-14 DIAGNOSIS — I10 Essential (primary) hypertension: Secondary | ICD-10-CM | POA: Diagnosis not present

## 2021-09-03 ENCOUNTER — Ambulatory Visit (INDEPENDENT_AMBULATORY_CARE_PROVIDER_SITE_OTHER): Payer: Medicare Other | Admitting: Family Medicine

## 2021-09-03 ENCOUNTER — Encounter (INDEPENDENT_AMBULATORY_CARE_PROVIDER_SITE_OTHER): Payer: Self-pay | Admitting: Family Medicine

## 2021-09-03 ENCOUNTER — Other Ambulatory Visit: Payer: Self-pay

## 2021-09-03 VITALS — BP 121/56 | HR 62 | Temp 97.9°F | Ht 62.0 in | Wt 214.0 lb

## 2021-09-03 DIAGNOSIS — I10 Essential (primary) hypertension: Secondary | ICD-10-CM

## 2021-09-03 DIAGNOSIS — Z6841 Body Mass Index (BMI) 40.0 and over, adult: Secondary | ICD-10-CM

## 2021-09-03 NOTE — Progress Notes (Signed)
Chief Complaint:   OBESITY Kristina [name] is here to discuss her progress with her obesity treatment plan along with follow-up of her obesity related diagnoses. Kristina [name] is on the Category 2 Plan and states she is following her eating plan approximately 85% of the time. Kristina [name] states she is walking the dog for 35-40 minutes 7 times per week.  Today's visit was #: 6 Starting weight: 228 lbs Starting date: 05/27/2021 Today's weight: 214 lbs Today's date: 09/03/2021 Total lbs lost to date: 74 Total lbs lost since last in-office visit: 1  Interim History: Kristina [name] continues to do well with weight loss even over Thanksgiving. She has done well with portion control and she is working on meeting her protein goals. Her hunger is sometimes an issues in there evenings, but she saves her snack and does well.  Subjective:   1. Essential hypertension Kristina [name]'s blood pressure is stable on her medications and she is doing well with diet, exercise, and weight loss. No chest pain, headache, or dizziness were noted.  Assessment/Plan:   1. Essential hypertension Kristina [name] will continue diet and exercise, and will watch for signs of hypotension as she continues her lifestyle modifications.  2. Obesity with current BMI of 39.3 Kristina [name] is currently in the action stage of change. As such, her goal is to continue with weight loss efforts. She has agreed to keeping a food journal and adhering to recommended goals of 1200 calories and 70+ grams of protein daily.   Exercise goals: As is.  Behavioral modification strategies: increasing lean protein intake, meal planning and cooking strategies, and holiday eating strategies .  Kristina [name] has agreed to follow-up with our clinic in 3 weeks. She was informed of the importance of frequent follow-up visits to maximize her success with intensive lifestyle modifications for her multiple health conditions.   Objective:   Blood pressure (!) 121/56, pulse 62,  temperature 97.9 F (36.6 C), height 5\' 2"  (1.575 m), weight 214 lb (97.1 kg), SpO2 96 %. Body mass index is 39.14 kg/m.  General: Cooperative, alert, well developed, in no acute distress. HEENT: Conjunctivae and lids unremarkable. Cardiovascular: Regular rhythm.  Lungs: Normal work of breathing. Neurologic: No focal deficits.   Lab Results  Component Value Date   CREATININE 0.60 07/08/2021   BUN 21 05/27/2021   NA 140 05/27/2021   K 4.4 05/27/2021   CL 98 05/27/2021   CO2 29 05/27/2021   Lab Results  Component Value Date   ALT 20 05/27/2021   AST 21 05/27/2021   ALKPHOS 69 05/27/2021   BILITOT 0.4 05/27/2021   Lab Results  Component Value Date   HGBA1C 5.7 (H) 05/27/2021   HGBA1C 5.5 12/15/2018   Lab Results  Component Value Date   INSULIN 6.3 05/27/2021   INSULIN 7.1 12/15/2018   Lab Results  Component Value Date   TSH 2.290 05/27/2021   Lab Results  Component Value Date   CHOL 183 05/27/2021   HDL 68 05/27/2021   LDLCALC 93 05/27/2021   TRIG 127 05/27/2021   CHOLHDL 2.5 12/22/2019   Lab Results  Component Value Date   VD25OH 60.6 05/27/2021   VD25OH 31.8 12/15/2018   Lab Results  Component Value Date   WBC 6.4 05/27/2021   HGB 11.8 05/27/2021   HCT 39.1 05/27/2021   MCV 68 (L) 05/27/2021   PLT 233 09/28/2019   No results found for: IRON, TIBC, FERRITIN  Attestation Statements:   Reviewed by clinician on day of visit: allergies, medications,  problem list, medical history, surgical history, family history, social history, and previous encounter notes.  Time spent on visit including pre-visit chart review and post-visit care and charting was 30 minutes.    I, Trixie Dredge, am acting as transcriptionist for Dennard Nip, MD.  I have reviewed the above documentation for accuracy and completeness, and I agree with the above. -  Dennard Nip, MD

## 2021-09-08 ENCOUNTER — Other Ambulatory Visit: Payer: Self-pay

## 2021-09-08 ENCOUNTER — Ambulatory Visit (HOSPITAL_COMMUNITY): Payer: Medicare Other | Attending: Cardiovascular Disease

## 2021-09-08 DIAGNOSIS — I350 Nonrheumatic aortic (valve) stenosis: Secondary | ICD-10-CM | POA: Insufficient documentation

## 2021-09-08 DIAGNOSIS — I10 Essential (primary) hypertension: Secondary | ICD-10-CM | POA: Diagnosis not present

## 2021-09-08 LAB — ECHOCARDIOGRAM COMPLETE
AR max vel: 1.65 cm2
AV Area VTI: 1.73 cm2
AV Area mean vel: 1.63 cm2
AV Mean grad: 11.5 mmHg
AV Peak grad: 22.2 mmHg
Ao pk vel: 2.36 m/s
Area-P 1/2: 2.37 cm2
S' Lateral: 3.7 cm

## 2021-09-10 DIAGNOSIS — M158 Other polyosteoarthritis: Secondary | ICD-10-CM | POA: Diagnosis not present

## 2021-09-10 DIAGNOSIS — I10 Essential (primary) hypertension: Secondary | ICD-10-CM | POA: Diagnosis not present

## 2021-09-10 DIAGNOSIS — E7800 Pure hypercholesterolemia, unspecified: Secondary | ICD-10-CM | POA: Diagnosis not present

## 2021-09-23 ENCOUNTER — Encounter (INDEPENDENT_AMBULATORY_CARE_PROVIDER_SITE_OTHER): Payer: Self-pay | Admitting: Family Medicine

## 2021-09-23 ENCOUNTER — Ambulatory Visit (INDEPENDENT_AMBULATORY_CARE_PROVIDER_SITE_OTHER): Payer: Medicare Other | Admitting: Family Medicine

## 2021-09-23 ENCOUNTER — Other Ambulatory Visit: Payer: Self-pay

## 2021-09-23 VITALS — BP 146/68 | HR 59 | Temp 98.0°F | Ht 62.0 in | Wt 213.0 lb

## 2021-09-23 DIAGNOSIS — E7849 Other hyperlipidemia: Secondary | ICD-10-CM

## 2021-09-23 DIAGNOSIS — R7303 Prediabetes: Secondary | ICD-10-CM

## 2021-09-23 DIAGNOSIS — E559 Vitamin D deficiency, unspecified: Secondary | ICD-10-CM | POA: Diagnosis not present

## 2021-09-23 DIAGNOSIS — Z6841 Body Mass Index (BMI) 40.0 and over, adult: Secondary | ICD-10-CM

## 2021-09-23 DIAGNOSIS — R195 Other fecal abnormalities: Secondary | ICD-10-CM | POA: Diagnosis not present

## 2021-09-23 NOTE — Progress Notes (Signed)
Chief Complaint:   OBESITY Kristina [name] is here to discuss her progress with her obesity treatment plan along with follow-up of her obesity related diagnoses. Kristina [name] is on keeping a food journal and adhering to recommended goals of 1200 calories and 70+ grams of protein daily and states she is following her eating plan approximately 80% of the time. Kristina [name] states she is walking for 35-40 minutes 3 times per week.  Today's visit was #: 7 Starting weight: 228 lbs Starting date: 05/27/2021 Today's weight: 213 lbs Today's date: 09/23/2021 Total lbs lost to date: 15 Total lbs lost since last in-office visit: 1  Interim History: Kristina [name] has done well with weight loss even with extra holiday temptations. She is making strategies to damage control over Christmas and New Years, and she feels well overall.  Subjective:   1. Vitamin D deficiency, unspecified Kristina [name] is on Vit D, and she is due to have labs checked. Her last Vit D level was at goal.  2. Pre-diabetes Kristina [name] is doing well with decreasing simple carbohydrates in her diet. She is due for labs.  3. Other hyperlipidemia Kristina [name] is on Pravachol and she is doing well with weight loss. No side effects of chest pain were noted.  Assessment/Plan:   1. Vitamin D deficiency, unspecified Low Vitamin D level contributes to fatigue and are associated with obesity, breast, and colon cancer. We will check labs today. Kristina [name] will follow-up for routine testing of Vitamin D, at least 2-3 times per year to avoid over-replacement.  - VITAMIN D 25 Hydroxy (Vit-D Deficiency, Fractures)  2. Pre-diabetes We will check labs today. Kristina [name] will continue to work on weight loss, exercise, and decreasing simple carbohydrates to help decrease the risk of diabetes.   - CMP14+EGFR - Insulin, random - Hemoglobin A1c  3. Other hyperlipidemia Cardiovascular risk and specific lipid/LDL goals reviewed.  We discussed several lifestyle  modifications today. We will check labs today. Kristina [name] will continue to work on diet, exercise, and weight loss efforts. Orders and follow up as documented in patient record.   - Lipid Panel With LDL/HDL Ratio  4. Obesity with current BMI of 39.0 Kristina [name] is currently in the action stage of change. As such, her goal is to continue with weight loss efforts. She has agreed to the Category 2 Plan.   Exercise goals: As is.  Behavioral modification strategies: increasing lean protein intake and meal planning and cooking strategies.  Kristina [name] has agreed to follow-up with our clinic in 4 weeks. She was informed of the importance of frequent follow-up visits to maximize her success with intensive lifestyle modifications for her multiple health conditions.   Kristina [name] was informed we would discuss her lab results at her next visit unless there is a critical issue that needs to be addressed sooner. Kristina [name] agreed to keep her next visit at the agreed upon time to discuss these results.  Objective:   Blood pressure (!) 146/68, pulse (!) 59, temperature 98 F (36.7 C), height _0  (1.575 m), weight 213 lb (96.6 kg), SpO2 97 %. Body mass index is 38.96 kg/m.  General: Cooperative, alert, well developed, in no acute distress. HEENT: Conjunctivae and lids unremarkable. Cardiovascular: Regular rhythm.  Lungs: Normal work of breathing. Neurologic: No focal deficits.   Lab Results  Component Value Date   CREATININE 0.60 07/08/2021   BUN 21 05/27/2021   NA 140 05/27/2021   K 4.4 05/27/2021   CL 98 05/27/2021   CO2 29 05/27/2021   Lab Results  Component Value Date   ALT 20 05/27/2021   AST 21 05/27/2021   ALKPHOS 69 05/27/2021   BILITOT 0.4 05/27/2021   Lab Results  Component Value Date   HGBA1C 5.7 (H) 05/27/2021   HGBA1C 5.5 12/15/2018   Lab Results  Component Value Date   INSULIN 6.3 05/27/2021   INSULIN 7.1 12/15/2018   Lab Results  Component Value Date   TSH 2.290  05/27/2021   Lab Results  Component Value Date   CHOL 183 05/27/2021   HDL 68 05/27/2021   LDLCALC 93 05/27/2021   TRIG 127 05/27/2021   CHOLHDL 2.5 12/22/2019   Lab Results  Component Value Date   VD25OH 60.6 05/27/2021   VD25OH 31.8 12/15/2018   Lab Results  Component Value Date   WBC 6.4 05/27/2021   HGB 11.8 05/27/2021   HCT 39.1 05/27/2021   MCV 68 (L) 05/27/2021   PLT 233 09/28/2019   No results found for: IRON, TIBC, FERRITIN  Obesity Behavioral Intervention:   Approximately 15 minutes were spent on the discussion below.  ASK: We discussed the diagnosis of obesity with Kristina [name] today and Kristina [name] agreed to give [name] permission to discuss obesity behavioral modification therapy today.  ASSESS: Kristina [name] has the diagnosis of obesity and her BMI today is 39.0. Kristina [name] is in the action stage of change.   ADVISE: Kristina [name] was educated on the multiple health risks of obesity as well as the benefit of weight loss to improve her health. She was advised of the need for long term treatment and the importance of lifestyle modifications to improve her current health and to decrease her risk of future health problems.  AGREE: Multiple dietary modification options and treatment options were discussed and Kristina [name] agreed to follow the recommendations documented in the above note.  ARRANGE: Kristina [name] was educated on the importance of frequent visits to treat obesity as outlined per CMS and USPSTF guidelines and agreed to schedule her next follow up appointment today.  Attestation Statements:   Reviewed by clinician on day of visit: allergies, medications, problem list, medical history, surgical history, family history, social history, and previous encounter notes.   I, Trixie Dredge, am acting as transcriptionist for Dennard Nip, MD.  I have reviewed the above documentation for accuracy and completeness, and I agree with the above. -  Dennard Nip, MD

## 2021-09-24 LAB — CMP14+EGFR
ALT: 26 IU/L (ref 0–32)
AST: 25 IU/L (ref 0–40)
Albumin/Globulin Ratio: 2 (ref 1.2–2.2)
Albumin: 4.5 g/dL (ref 3.7–4.7)
Alkaline Phosphatase: 72 IU/L (ref 44–121)
BUN/Creatinine Ratio: 23 (ref 12–28)
BUN: 14 mg/dL (ref 8–27)
Bilirubin Total: 0.6 mg/dL (ref 0.0–1.2)
CO2: 26 mmol/L (ref 20–29)
Calcium: 9.5 mg/dL (ref 8.7–10.3)
Chloride: 100 mmol/L (ref 96–106)
Creatinine, Ser: 0.61 mg/dL (ref 0.57–1.00)
Globulin, Total: 2.3 g/dL (ref 1.5–4.5)
Glucose: 92 mg/dL (ref 70–99)
Potassium: 4.8 mmol/L (ref 3.5–5.2)
Sodium: 143 mmol/L (ref 134–144)
Total Protein: 6.8 g/dL (ref 6.0–8.5)
eGFR: 93 mL/min/{1.73_m2} (ref >59)

## 2021-09-24 LAB — LIPID PANEL WITH LDL/HDL RATIO
Cholesterol, Total: 169 mg/dL (ref 100–199)
HDL: 66 mg/dL (ref >39)
LDL Chol Calc (NIH): 85 mg/dL (ref 0–99)
LDL/HDL Ratio: 1.3 ratio (ref 0.0–3.2)
Triglycerides: 102 mg/dL (ref 0–149)
VLDL Cholesterol Cal: 18 mg/dL (ref 5–40)

## 2021-09-24 LAB — VITAMIN D 25 HYDROXY (VIT D DEFICIENCY, FRACTURES): Vit D, 25-Hydroxy: 67.6 ng/mL (ref 30.0–100.0)

## 2021-09-24 LAB — HEMOGLOBIN A1C
Est. average glucose Bld gHb Est-mCnc: 117 mg/dL
Hgb A1c MFr Bld: 5.7 % — ABNORMAL HIGH (ref 4.8–5.6)

## 2021-09-24 LAB — INSULIN, RANDOM: INSULIN: 5.1 u[IU]/mL (ref 2.6–24.9)

## 2021-09-25 ENCOUNTER — Ambulatory Visit (INDEPENDENT_AMBULATORY_CARE_PROVIDER_SITE_OTHER): Payer: Medicare Other | Admitting: Family Medicine

## 2021-10-05 HISTORY — PX: COLONOSCOPY: SHX174

## 2021-10-17 DIAGNOSIS — H02831 Dermatochalasis of right upper eyelid: Secondary | ICD-10-CM | POA: Diagnosis not present

## 2021-10-17 DIAGNOSIS — H35033 Hypertensive retinopathy, bilateral: Secondary | ICD-10-CM | POA: Diagnosis not present

## 2021-10-17 DIAGNOSIS — H3582 Retinal ischemia: Secondary | ICD-10-CM | POA: Diagnosis not present

## 2021-10-17 DIAGNOSIS — H3581 Retinal edema: Secondary | ICD-10-CM | POA: Diagnosis not present

## 2021-10-17 DIAGNOSIS — H40053 Ocular hypertension, bilateral: Secondary | ICD-10-CM | POA: Diagnosis not present

## 2021-10-17 DIAGNOSIS — H2513 Age-related nuclear cataract, bilateral: Secondary | ICD-10-CM | POA: Diagnosis not present

## 2021-10-27 ENCOUNTER — Other Ambulatory Visit: Payer: Self-pay

## 2021-10-27 ENCOUNTER — Encounter (INDEPENDENT_AMBULATORY_CARE_PROVIDER_SITE_OTHER): Payer: Self-pay | Admitting: Family Medicine

## 2021-10-27 ENCOUNTER — Ambulatory Visit (INDEPENDENT_AMBULATORY_CARE_PROVIDER_SITE_OTHER): Payer: Medicare Other | Admitting: Family Medicine

## 2021-10-27 VITALS — BP 134/72 | HR 51 | Temp 97.5°F | Ht 62.0 in | Wt 212.0 lb

## 2021-10-27 DIAGNOSIS — R7303 Prediabetes: Secondary | ICD-10-CM | POA: Diagnosis not present

## 2021-10-27 DIAGNOSIS — I10 Essential (primary) hypertension: Secondary | ICD-10-CM

## 2021-10-27 DIAGNOSIS — Z6838 Body mass index (BMI) 38.0-38.9, adult: Secondary | ICD-10-CM

## 2021-10-27 DIAGNOSIS — E669 Obesity, unspecified: Secondary | ICD-10-CM

## 2021-10-27 NOTE — Progress Notes (Signed)
Chief Complaint:   OBESITY Kristina [name] is here to discuss her progress with her obesity treatment plan along with follow-up of her obesity related diagnoses. Kristina [name] is on the Category 2 Plan and states she is following her eating plan approximately 85% of the time. Kristina [name] states she is walking the dog 2 times daily for 45 minutes 7 times per week.  Today's visit was #: 8 Starting weight: 228 lbs Starting date: 05/27/2021 Today's weight: 212 lbs Today's date: 10/27/2021 Total lbs lost to date: 16 Total lbs lost since last in-office visit: 1  Interim History: Kristina [name] continues to do well with weight loss. She is working on walking most days of the week. Her hunger is controlled and she is working on increased lean protein and meal planning.  Subjective:   1. Essential hypertension Kristina [name]'s blood pressure is stable on her medications. She continues to do well with weight loss and exercise. She denies signs of hypotension.  2. Pre-diabetes Kristina [name]'s recent A1c is stable at 5.7. She continues to work on decreasing simple carbohydrates, and she denies polyphagia has decreased. I discussed labs with the patient today.  Assessment/Plan:   1. Essential hypertension Kristina [name] will continue with diet, exercise, and medications and increase water intake to improve blood pressure control. We will watch for signs of hypotension as she continues her lifestyle modifications.  2. Pre-diabetes Kristina [name] will continue with diet, exercise, and decreasing simple carbohydrates to help decrease the risk of diabetes. We will continue to monitor.  3. Obesity, current BMI 38.8 Kristina [name] is currently in the action stage of change. As such, her goal is to continue with weight loss efforts. She has agreed to the Category 2 Plan.   Exercise goals: As is.  Behavioral modification strategies: increasing lean protein intake and increasing water intake.  Kristina [name] has agreed to follow-up with  our clinic in 4 to 5 weeks. She was informed of the importance of frequent follow-up visits to maximize her success with intensive lifestyle modifications for her multiple health conditions.   Objective:   Blood pressure 134/72, pulse (!) 51, temperature (!) 97.5 F (36.4 C), height 5\' 2"  (1.575 m), weight 212 lb (96.2 kg), SpO2 98 %. Body mass index is 38.78 kg/m.  General: Cooperative, alert, well developed, in no acute distress. HEENT: Conjunctivae and lids unremarkable. Cardiovascular: Regular rhythm.  Lungs: Normal work of breathing. Neurologic: No focal deficits.   Lab Results  Component Value Date   CREATININE 0.61 09/23/2021   BUN 14 09/23/2021   NA 143 09/23/2021   K 4.8 09/23/2021   CL 100 09/23/2021   CO2 26 09/23/2021   Lab Results  Component Value Date   ALT 26 09/23/2021   AST 25 09/23/2021   ALKPHOS 72 09/23/2021   BILITOT 0.6 09/23/2021   Lab Results  Component Value Date   HGBA1C 5.7 (H) 09/23/2021   HGBA1C 5.7 (H) 05/27/2021   HGBA1C 5.5 12/15/2018   Lab Results  Component Value Date   INSULIN 5.1 09/23/2021   INSULIN 6.3 05/27/2021   INSULIN 7.1 12/15/2018   Lab Results  Component Value Date   TSH 2.290 05/27/2021   Lab Results  Component Value Date   CHOL 169 09/23/2021   HDL 66 09/23/2021   LDLCALC 85 09/23/2021   TRIG 102 09/23/2021   CHOLHDL 2.5 12/22/2019   Lab Results  Component Value Date   VD25OH 67.6 09/23/2021   VD25OH 60.6 05/27/2021   VD25OH 31.8 12/15/2018   Lab Results  Component Value Date   WBC 6.4 05/27/2021   HGB 11.8 05/27/2021   HCT 39.1 05/27/2021   MCV 68 (L) 05/27/2021   PLT 233 09/28/2019   No results found for: IRON, TIBC, FERRITIN  Attestation Statements:   Reviewed by clinician on day of visit: allergies, medications, problem list, medical history, surgical history, family history, social history, and previous encounter notes.  Time spent on visit including pre-visit chart review and post-visit  care and charting was 33 minutes.    I, Trixie Dredge, am acting as transcriptionist for Dennard Nip, MD.  I have reviewed the above documentation for accuracy and completeness, and I agree with the above. -  Dennard Nip, MD

## 2021-11-17 DIAGNOSIS — R079 Chest pain, unspecified: Secondary | ICD-10-CM | POA: Diagnosis not present

## 2021-11-17 DIAGNOSIS — I10 Essential (primary) hypertension: Secondary | ICD-10-CM | POA: Diagnosis not present

## 2021-11-17 DIAGNOSIS — R0789 Other chest pain: Secondary | ICD-10-CM | POA: Diagnosis not present

## 2021-11-21 ENCOUNTER — Other Ambulatory Visit: Payer: Medicare Other

## 2021-12-01 ENCOUNTER — Ambulatory Visit (INDEPENDENT_AMBULATORY_CARE_PROVIDER_SITE_OTHER): Payer: Medicare Other | Admitting: Family Medicine

## 2021-12-01 ENCOUNTER — Encounter (INDEPENDENT_AMBULATORY_CARE_PROVIDER_SITE_OTHER): Payer: Self-pay | Admitting: Family Medicine

## 2021-12-01 ENCOUNTER — Other Ambulatory Visit: Payer: Self-pay

## 2021-12-01 VITALS — BP 146/75 | HR 61 | Temp 97.7°F | Ht 62.0 in | Wt 214.0 lb

## 2021-12-01 DIAGNOSIS — Z6839 Body mass index (BMI) 39.0-39.9, adult: Secondary | ICD-10-CM

## 2021-12-01 DIAGNOSIS — E669 Obesity, unspecified: Secondary | ICD-10-CM

## 2021-12-01 DIAGNOSIS — I10 Essential (primary) hypertension: Secondary | ICD-10-CM

## 2021-12-01 DIAGNOSIS — Z6841 Body Mass Index (BMI) 40.0 and over, adult: Secondary | ICD-10-CM

## 2021-12-02 NOTE — Progress Notes (Signed)
Chief Complaint:   OBESITY Kristina [name] is here to discuss her progress with her obesity treatment plan along with follow-up of her obesity related diagnoses. Kristina [name] is on the Category 2 Plan and states she is following her eating plan approximately 85% of the time. Kristina [name] states she is walking the dog for 45-50 minutes 7 times per week.  Today's visit was #: 9 Starting weight: 228 lbs Starting date: 05/27/2021 Today's weight: 214 lbs Today's date: 12/01/2021 Total lbs lost to date: 14 Total lbs lost since last in-office visit: 0  Interim History: Kristina [name] appears to be retaining some fluid. She had a foot injury and exercise was limited. She is now back to walking.  Subjective:   1. Essential hypertension Kristina [name]'s blood pressure is borderline elevated. She continue to work on diet, exercise, and weight loss to improve her blood pressure and decrease the risk of cardiac problems.  Assessment/Plan:   1. Essential hypertension Kristina [name] will continue with diet and exercise, and we will recheck her blood pressure in 2 to 3 weeks.  2. Obesity, current BMI 39.2 Kristina [name] is currently in the action stage of change. As such, her goal is to continue with weight loss efforts. She has agreed to the Category 2 Plan.   Exercise goals: As is.  Behavioral modification strategies: increasing water intake and decreasing sodium intake.  Kristina [name] has agreed to follow-up with our clinic in 2 to 3 weeks. She was informed of the importance of frequent follow-up visits to maximize her success with intensive lifestyle modifications for her multiple health conditions.   Objective:   Blood pressure (!) 146/75, pulse 61, temperature 97.7 F (36.5 C), height 5\' 2"  (1.575 m), weight 214 lb (97.1 kg), SpO2 98 %. Body mass index is 39.14 kg/m.  General: Cooperative, alert, well developed, in no acute distress. HEENT: Conjunctivae and lids unremarkable. Cardiovascular: Regular rhythm.   Lungs: Normal work of breathing. Neurologic: No focal deficits.   Lab Results  Component Value Date   CREATININE 0.61 09/23/2021   BUN 14 09/23/2021   NA 143 09/23/2021   K 4.8 09/23/2021   CL 100 09/23/2021   CO2 26 09/23/2021   Lab Results  Component Value Date   ALT 26 09/23/2021   AST 25 09/23/2021   ALKPHOS 72 09/23/2021   BILITOT 0.6 09/23/2021   Lab Results  Component Value Date   HGBA1C 5.7 (H) 09/23/2021   HGBA1C 5.7 (H) 05/27/2021   HGBA1C 5.5 12/15/2018   Lab Results  Component Value Date   INSULIN 5.1 09/23/2021   INSULIN 6.3 05/27/2021   INSULIN 7.1 12/15/2018   Lab Results  Component Value Date   TSH 2.290 05/27/2021   Lab Results  Component Value Date   CHOL 169 09/23/2021   HDL 66 09/23/2021   LDLCALC 85 09/23/2021   TRIG 102 09/23/2021   CHOLHDL 2.5 12/22/2019   Lab Results  Component Value Date   VD25OH 67.6 09/23/2021   VD25OH 60.6 05/27/2021   VD25OH 31.8 12/15/2018   Lab Results  Component Value Date   WBC 6.4 05/27/2021   HGB 11.8 05/27/2021   HCT 39.1 05/27/2021   MCV 68 (L) 05/27/2021   PLT 233 09/28/2019   No results found for: IRON, TIBC, FERRITIN  Attestation Statements:   Reviewed by clinician on day of visit: allergies, medications, problem list, medical history, surgical history, family history, social history, and previous encounter notes.  Time spent on visit including pre-visit chart review and post-visit care  and charting was 22 minutes.    I, Trixie Dredge, am acting as transcriptionist for Dennard Nip, MD.  I have reviewed the above documentation for accuracy and completeness, and I agree with the above. -  Dennard Nip, MD

## 2021-12-11 DIAGNOSIS — U071 COVID-19: Secondary | ICD-10-CM | POA: Diagnosis not present

## 2021-12-15 ENCOUNTER — Telehealth (INDEPENDENT_AMBULATORY_CARE_PROVIDER_SITE_OTHER): Payer: Medicare Other | Admitting: Family Medicine

## 2021-12-15 ENCOUNTER — Encounter (INDEPENDENT_AMBULATORY_CARE_PROVIDER_SITE_OTHER): Payer: Self-pay | Admitting: Family Medicine

## 2021-12-15 DIAGNOSIS — U071 COVID-19: Secondary | ICD-10-CM | POA: Diagnosis not present

## 2021-12-15 DIAGNOSIS — E669 Obesity, unspecified: Secondary | ICD-10-CM | POA: Diagnosis not present

## 2021-12-15 DIAGNOSIS — Z6839 Body mass index (BMI) 39.0-39.9, adult: Secondary | ICD-10-CM | POA: Diagnosis not present

## 2021-12-16 NOTE — Progress Notes (Signed)
? ? ?TeleHealth Visit:  ?Due to the COVID-19 pandemic, this visit was completed with telemedicine (audio/video) technology to reduce patient and provider exposure as well as to preserve personal protective equipment.  ? ?Kristina [name] has verbally consented to this TeleHealth visit. The patient is located at home, the provider is located at the [facility] office. The participants in this visit include the listed provider and patient. The visit was conducted today via MyChart video. ? ? ?Chief Complaint: OBESITY ?Kristina [name] is here to discuss her progress with her obesity treatment plan along with follow-up of her obesity related diagnoses. Kristina [name] is on the Category 2 Plan and states she is following her eating plan approximately 75% of the time. Kristina [name] states she is walking for 45-60 minutes 6 times per week. ? ?Today's visit was #: 10 ?Starting weight: 228 lbs ?Starting date: 05/27/2021 ? ?Interim History: Kristina [name] changed her visit to Telehealth due to being COVID positive. She has tried t increase her protein during this time, but her appetite was limited. She is feeling better today, and she is working on getting back on track with her plan. She feels she has at least maintained her weight at this time. ? ?Subjective:  ? ?1. COVID ?Kristina [name] is finishing up her Paxlovid. She tested positive last week and had mild symptoms overall. She has been able to drink liquids and eat soups. She is feeling better today. ? ?Assessment/Plan:  ? ?1. COVID ?Kristina [name] was encouraged to finish her Paxlovid and she will continue to take COVID precautions especially in crowds or groups. ? ?2. Obesity, current BMI 39.2 ?Kristina [name] is currently in the action stage of change. As such, her goal is to continue with weight loss efforts. She has agreed to the Category 2 Plan.  ? ?Behavioral modification strategies: increasing lean protein intake and increasing water intake. ? ?Kristina [name] has agreed to follow-up with our  clinic in 2 weeks. She was informed of the importance of frequent follow-up visits to maximize her success with intensive lifestyle modifications for her multiple health conditions. ? ?Objective:  ? ?VITALS: Per patient if applicable, see vitals. ?GENERAL: Alert and in no acute distress. ?CARDIOPULMONARY: No increased WOB. Speaking in clear sentences.  ?PSYCH: Pleasant and cooperative. Speech normal rate and rhythm. Affect is appropriate. Insight and judgement are appropriate. Attention is focused, linear, and appropriate.  ?NEURO: Oriented as arrived to appointment on time with no prompting.  ? ?Lab Results  ?Component Value Date  ? CREATININE 0.61 09/23/2021  ? BUN 14 09/23/2021  ? NA 143 09/23/2021  ? K 4.8 09/23/2021  ? CL 100 09/23/2021  ? CO2 26 09/23/2021  ? ?Lab Results  ?Component Value Date  ? ALT 26 09/23/2021  ? AST 25 09/23/2021  ? ALKPHOS 72 09/23/2021  ? BILITOT 0.6 09/23/2021  ? ?Lab Results  ?Component Value Date  ? HGBA1C 5.7 (H) 09/23/2021  ? HGBA1C 5.7 (H) 05/27/2021  ? HGBA1C 5.5 12/15/2018  ? ?Lab Results  ?Component Value Date  ? INSULIN 5.1 09/23/2021  ? INSULIN 6.3 05/27/2021  ? INSULIN 7.1 12/15/2018  ? ?Lab Results  ?Component Value Date  ? TSH 2.290 05/27/2021  ? ?Lab Results  ?Component Value Date  ? CHOL 169 09/23/2021  ? HDL 66 09/23/2021  ? [place] 85 09/23/2021  ? TRIG 102 09/23/2021  ? CHOLHDL 2.5 12/22/2019  ? ?Lab Results  ?Component Value Date  ? VD25OH 67.6 09/23/2021  ? VD25OH 60.6 05/27/2021  ? VD25OH 31.8 12/15/2018  ? ?Lab  Results  ?Component Value Date  ? WBC 6.4 05/27/2021  ? HGB 11.8 05/27/2021  ? HCT 39.1 05/27/2021  ? MCV 68 (L) 05/27/2021  ? PLT 233 09/28/2019  ? ?No results found for: IRON, TIBC, FERRITIN ? ?Attestation Statements:  ? ?Reviewed by clinician on day of visit: allergies, medications, problem list, medical history, surgical history, family history, social history, and previous encounter notes. ? ? ?I, Trixie Dredge, am acting as transcriptionist for Dennard Nip, MD. ? ?I have reviewed the above documentation for accuracy and completeness, and I agree with the above. - Dennard Nip, MD ? ? ?

## 2021-12-23 DIAGNOSIS — Z23 Encounter for immunization: Secondary | ICD-10-CM | POA: Diagnosis not present

## 2021-12-23 DIAGNOSIS — D229 Melanocytic nevi, unspecified: Secondary | ICD-10-CM | POA: Diagnosis not present

## 2021-12-23 DIAGNOSIS — L821 Other seborrheic keratosis: Secondary | ICD-10-CM | POA: Diagnosis not present

## 2021-12-23 DIAGNOSIS — L578 Other skin changes due to chronic exposure to nonionizing radiation: Secondary | ICD-10-CM | POA: Diagnosis not present

## 2021-12-23 DIAGNOSIS — D1801 Hemangioma of skin and subcutaneous tissue: Secondary | ICD-10-CM | POA: Diagnosis not present

## 2021-12-23 DIAGNOSIS — L720 Epidermal cyst: Secondary | ICD-10-CM | POA: Diagnosis not present

## 2021-12-23 DIAGNOSIS — L814 Other melanin hyperpigmentation: Secondary | ICD-10-CM | POA: Diagnosis not present

## 2021-12-24 ENCOUNTER — Other Ambulatory Visit: Payer: Self-pay | Admitting: Family Medicine

## 2021-12-24 DIAGNOSIS — R319 Hematuria, unspecified: Secondary | ICD-10-CM

## 2021-12-25 ENCOUNTER — Other Ambulatory Visit: Payer: Self-pay

## 2021-12-25 ENCOUNTER — Ambulatory Visit
Admission: RE | Admit: 2021-12-25 | Discharge: 2021-12-25 | Disposition: A | Discharge status: Home / Self Care | Payer: Medicare Other | Source: Ambulatory Visit | Attending: Family Medicine | Admitting: Family Medicine

## 2021-12-25 DIAGNOSIS — K5730 Diverticulosis of large intestine without perforation or abscess without bleeding: Secondary | ICD-10-CM | POA: Diagnosis not present

## 2021-12-25 DIAGNOSIS — N281 Cyst of kidney, acquired: Secondary | ICD-10-CM | POA: Diagnosis not present

## 2021-12-25 DIAGNOSIS — R310 Gross hematuria: Secondary | ICD-10-CM | POA: Diagnosis not present

## 2021-12-25 DIAGNOSIS — R319 Hematuria, unspecified: Secondary | ICD-10-CM

## 2021-12-25 DIAGNOSIS — I700 Atherosclerosis of aorta: Secondary | ICD-10-CM | POA: Diagnosis not present

## 2021-12-25 MED ORDER — IOPAMIDOL (ISOVUE-300) INJECTION 61%
100.0000 mL | Freq: Once | INTRAVENOUS | Status: AC | PRN
  Administered 2021-12-25: 100 mL via INTRAVENOUS

## 2021-12-29 ENCOUNTER — Encounter (INDEPENDENT_AMBULATORY_CARE_PROVIDER_SITE_OTHER): Payer: Self-pay | Admitting: Family Medicine

## 2021-12-29 ENCOUNTER — Other Ambulatory Visit: Payer: Self-pay

## 2021-12-29 ENCOUNTER — Ambulatory Visit (INDEPENDENT_AMBULATORY_CARE_PROVIDER_SITE_OTHER): Payer: Medicare Other | Admitting: Family Medicine

## 2021-12-29 VITALS — BP 135/69 | HR 60 | Temp 98.1°F | Ht 62.0 in | Wt 211.0 lb

## 2021-12-29 DIAGNOSIS — E559 Vitamin D deficiency, unspecified: Secondary | ICD-10-CM | POA: Diagnosis not present

## 2021-12-29 DIAGNOSIS — E669 Obesity, unspecified: Secondary | ICD-10-CM

## 2021-12-29 DIAGNOSIS — N8500 Endometrial hyperplasia, unspecified: Secondary | ICD-10-CM

## 2021-12-29 DIAGNOSIS — Z6841 Body Mass Index (BMI) 40.0 and over, adult: Secondary | ICD-10-CM

## 2021-12-30 ENCOUNTER — Other Ambulatory Visit: Payer: Self-pay | Admitting: Family Medicine

## 2021-12-30 DIAGNOSIS — R935 Abnormal findings on diagnostic imaging of other abdominal regions, including retroperitoneum: Secondary | ICD-10-CM

## 2021-12-31 ENCOUNTER — Ambulatory Visit
Admission: RE | Admit: 2021-12-31 | Discharge: 2021-12-31 | Disposition: A | Discharge status: Home / Self Care | Payer: Medicare Other | Source: Ambulatory Visit | Attending: Family Medicine | Admitting: Family Medicine

## 2021-12-31 DIAGNOSIS — Z780 Asymptomatic menopausal state: Secondary | ICD-10-CM | POA: Diagnosis not present

## 2021-12-31 DIAGNOSIS — R935 Abnormal findings on diagnostic imaging of other abdominal regions, including retroperitoneum: Secondary | ICD-10-CM

## 2021-12-31 DIAGNOSIS — R9389 Abnormal findings on diagnostic imaging of other specified body structures: Secondary | ICD-10-CM | POA: Diagnosis not present

## 2021-12-31 MED ORDER — VITAMIN D (ERGOCALCIFEROL) 1.25 MG (50000 UNIT) PO CAPS: 50000.0000 [IU] | ORAL_CAPSULE | ORAL | 0 refills | Status: AC

## 2021-12-31 NOTE — Progress Notes (Signed)
? ? ? ?Chief Complaint:  ? ?OBESITY ?Kristina [name] is here to discuss her progress with her obesity treatment plan along with follow-up of her obesity related diagnoses. Kristina [name] is on the Category 2 Plan and states she is following her eating plan approximately 85% of the time. Kristina [name] states she is doing 0 minutes 0 times per week. ? ?Today's visit was #: 61 ?Starting weight: 228 lbs ?Starting date: 05/27/2021 ?Today's weight: 211 lbs ?Today's date: 12/29/2021 ?Total lbs lost to date: 18 ?Total lbs lost since last in-office visit: 3 ? ?Interim History: Kristina [name] continues to do well with weight loss. She is doing well with her eating plan but she hasn't been able to exercise as much due to other obligations. ? ?Subjective:  ? ?1. Vitamin D deficiency, unspecified ?Kristina [name] is stable on Vitamin D, and she due for labs next month. She denies nausea, vomiting, or muscle weakness. ? ?2. Endometrial hyperplasia ?Kristina [name] had what appeared to be blood in her urine. She had a CT scan, which showed a thickened endometrium. She has an ultrasound scheduled but she has questions about what this could mean.   ? ?Assessment/Plan:  ? ?1. Vitamin D deficiency, unspecified ?Kristina [name] will continue prescription Vitamin D 50,000 IU every week #4, and we will refill for 1 month. She will follow-up for routine testing of Vitamin D, at least 2-3 times per year to avoid over-replacement. ? ?2. Endometrial hyperplasia ?Kristina [name] was educated on obesity and increase estrogen leading to thickened endometrium and post menopausal bleeding. She was encouraged to continue her investigation and testing to make sure this is not endometrial cancer. She agreed to follow up as instructed.  ? ?3. Obesity, current BMI 38.7 ?Kristina [name] is currently in the action stage of change. As such, her goal is to continue with weight loss efforts. She has agreed to the Category 2 Plan.  ? ?We will recheck fasting labs at her next visit. ? ?Behavioral  modification strategies: increasing lean protein intake and meal planning and cooking strategies. ? ?Kristina [name] has agreed to follow-up with our clinic in 4 weeks. She was informed of the importance of frequent follow-up visits to maximize her success with intensive lifestyle modifications for her multiple health conditions.  ? ?Objective:  ? ?Blood pressure 135/69, pulse 60, temperature 98.1 ?F (36.7 ?C), height '5\' 2"'$  (1.575 m), weight 211 lb (95.7 kg), SpO2 96 %. ?Body mass index is 38.59 kg/m?. ? ?General: Cooperative, alert, well developed, in no acute distress. ?HEENT: Conjunctivae and lids unremarkable. ?Cardiovascular: Regular rhythm.  ?Lungs: Normal work of breathing. ?Neurologic: No focal deficits.  ? ?Lab Results  ?Component Value Date  ? CREATININE 0.61 09/23/2021  ? BUN 14 09/23/2021  ? NA 143 09/23/2021  ? K 4.8 09/23/2021  ? CL 100 09/23/2021  ? CO2 26 09/23/2021  ? ?Lab Results  ?Component Value Date  ? ALT 26 09/23/2021  ? AST 25 09/23/2021  ? ALKPHOS 72 09/23/2021  ? BILITOT 0.6 09/23/2021  ? ?Lab Results  ?Component Value Date  ? HGBA1C 5.7 (H) 09/23/2021  ? HGBA1C 5.7 (H) 05/27/2021  ? HGBA1C 5.5 12/15/2018  ? ?Lab Results  ?Component Value Date  ? INSULIN 5.1 09/23/2021  ? INSULIN 6.3 05/27/2021  ? INSULIN 7.1 12/15/2018  ? ?Lab Results  ?Component Value Date  ? TSH 2.290 05/27/2021  ? ?Lab Results  ?Component Value Date  ? CHOL 169 09/23/2021  ? HDL 66 09/23/2021  ? [place] 85 09/23/2021  ? TRIG 102 09/23/2021  ? CHOLHDL 2.5  12/22/2019  ? ?Lab Results  ?Component Value Date  ? VD25OH 67.6 09/23/2021  ? VD25OH 60.6 05/27/2021  ? VD25OH 31.8 12/15/2018  ? ?Lab Results  ?Component Value Date  ? WBC 6.4 05/27/2021  ? HGB 11.8 05/27/2021  ? HCT 39.1 05/27/2021  ? MCV 68 (L) 05/27/2021  ? PLT 233 09/28/2019  ? ?No results found for: IRON, TIBC, FERRITIN ? ?Obesity Behavioral Intervention:  ? ?Approximately 15 minutes were spent on the discussion below. ? ?ASK: ?We discussed the diagnosis of obesity  with Kristina [name] today and Kristina [name] agreed to give [name] permission to discuss obesity behavioral modification therapy today. ? ?ASSESS: ?Kristina [name] has the diagnosis of obesity and her BMI today is 38.7. Kristina [name] is in the action stage of change.  ? ?ADVISE: ?Kristina [name] was educated on the multiple health risks of obesity as well as the benefit of weight loss to improve her health. She was advised of the need for long term treatment and the importance of lifestyle modifications to improve her current health and to decrease her risk of future health problems. ? ?AGREE: ?Multiple dietary modification options and treatment options were discussed and Kristina [name] agreed to follow the recommendations documented in the above note. ? ?ARRANGE: ?Kristina [name] was educated on the importance of frequent visits to treat obesity as outlined per CMS and USPSTF guidelines and agreed to schedule her next follow up appointment today. ? ?Attestation Statements:  ? ?Reviewed by clinician on day of visit: allergies, medications, problem list, medical history, surgical history, family history, social history, and previous encounter notes. ? ? ?I, Trixie Dredge, am acting as transcriptionist for Dennard Nip, MD. ? ?I have reviewed the above documentation for accuracy and completeness, and I agree with the above. -  Dennard Nip, MD ? ? ?

## 2022-01-06 DIAGNOSIS — R9389 Abnormal findings on diagnostic imaging of other specified body structures: Secondary | ICD-10-CM | POA: Diagnosis not present

## 2022-01-06 DIAGNOSIS — R310 Gross hematuria: Secondary | ICD-10-CM | POA: Diagnosis not present

## 2022-01-15 DIAGNOSIS — R350 Frequency of micturition: Secondary | ICD-10-CM | POA: Diagnosis not present

## 2022-01-15 DIAGNOSIS — R310 Gross hematuria: Secondary | ICD-10-CM | POA: Diagnosis not present

## 2022-01-21 DIAGNOSIS — R310 Gross hematuria: Secondary | ICD-10-CM | POA: Diagnosis not present

## 2022-01-22 NOTE — Progress Notes (Signed)
?[facility] Note ? ?01/28/2022 ? ?  ? ?CHIEF COMPLAINT ?Patient presents for Retina Follow Up ? ? ?HISTORY OF PRESENT ILLNESS: ?Kristina [name] is a 76 y.o. female who presents to the clinic today for:  ? ?HPI   ? ? Retina Follow Up   ?Patient presents with  Other.  In left eye.  This started 1 year ago.  I, the attending physician,  performed the HPI with the patient and updated documentation appropriately. ? ?  ?  ? ? 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 Patient states vision doing good. No eye pain. Recently had blood in urine. Still being tested. Have been negative so far. One test left to do.  ? ?  ?  ?Last edited by Bernarda Caffey, MD on 01/28/2022 12:30 PM.  ?  ?Pt states vision is stable, she has not had any episodes of vision loss since her endarterectomy  ? ?Referring physician: ?Warden Fillers, MD ?[address] ?[address] ?[place],  [place] [phone] ? ?HISTORICAL INFORMATION:  ? ?Selected notes from the [facility] ?Referred by Dr. Parke Simmers for eval of transient loss of vision OS.  ? ?CURRENT MEDICATIONS: ?Current Outpatient Medications (Ophthalmic Drugs)  ?Medication Sig  ? dorzolamide-timolol (COSOPT) 22.3-6.8 MG/ML ophthalmic solution Place 1 drop into both eyes 2 (two) times daily.  ? ?No current facility-administered medications for this visit. (Ophthalmic Drugs)  ? ?Current Outpatient Medications (Other)  ?Medication Sig  ? acetaminophen (TYLENOL) 325 MG tablet Take 1-2 tablets (325-650 mg total) by mouth every 6 (six) hours as needed for mild pain (or temp >/= 101 F).  ? aspirin EC 81 MG tablet Take 81 mg by mouth daily.  ? ELDERBERRY PO Take 2 capsules by mouth daily.  ? escitalopram (LEXAPRO) 10 MG tablet Take 10 mg by mouth daily.  ? GLUCOSAMINE-CHONDROITIN PO Take 1 tablet by mouth 2 (two) times daily.   ? hydrochlorothiazide (HYDRODIURIL) 25 MG tablet Take 25 mg by mouth daily.   ? losartan (COZAAR) 100 MG  tablet Take 100 mg by mouth daily.  ? MELATONIN ER PO Melatonin  ? montelukast (SINGULAIR) 10 MG tablet TAKE 1 TABLET(10 MG) BY MOUTH AT BEDTIME (Patient taking differently: Take 10 mg by mouth at bedtime.)  ? Multiple Vitamin (MULTIVITAMIN) capsule Take 1 capsule by mouth daily.  ? Multiple Vitamins-Minerals (AIRBORNE GUMMIES PO) Take 3 tablets by mouth daily.   ? terbinafine (LAMISIL) 250 MG tablet 1 tablet  ? Vitamin D, Ergocalciferol, (DRISDOL) 1.25 MG (50000 UNIT) CAPS capsule Take 1 capsule (50,000 Units total) by mouth every 7 (seven) days.  ? pravastatin (PRAVACHOL) 40 MG tablet Take 1 tablet (40 mg total) by mouth every evening. (Patient taking differently: Take 40 mg by mouth daily.)  ? ?No current facility-administered medications for this visit. (Other)  ? ?REVIEW OF SYSTEMS: ?ROS   ?Positive for: Neurological, Musculoskeletal, Cardiovascular, Eyes ?Negative for: Constitutional, Gastrointestinal, Skin, Genitourinary, HENT, Endocrine, Respiratory, Psychiatric, Allergic/Imm, Heme/Lymph ?Last edited by Theodore Demark, COA on 01/28/2022  9:54 AM.  ?  ? ?ALLERGIES ?Allergies  ?Allergen Reactions  ? Penicillins Anaphylaxis and Itching  ?  Did it involve swelling of the face/tongue/throat, SOB, or low BP? Yes ?Did it involve sudden or severe rash/hives, skin peeling, or any reaction on the inside of your mouth or nose? No ?Did you need to seek medical attention at a hospital or doctor's office? Yes ?When did  it last happen?      Childhood allergy ?If all above answers are ?NO?, may proceed with cephalosporin use. ?  ? Mite (D. Farinae)   ?  Other reaction(s): sinus swelling  ? ? ?PAST MEDICAL HISTORY ?Past Medical History:  ?Diagnosis Date  ? Allergic rhinitis   ? Anemia glaucoma  ? Anxiety   ? Back pain   ? Cataract   ? Mixed OU  ? DDD (degenerative disc disease), lumbar   ? High blood pressure   ? High cholesterol   ? History of stomach ulcers   ? Hypertensive retinopathy   ? OU  ? Rheumatic fever   ?  Rheumatic fever   ? Rheumatoid arteritis ([place])   ? ?Past Surgical History:  ?Procedure Laterality Date  ? APPENDECTOMY  1955  ? CHOLECYSTECTOMY  1963  ? ENDARTERECTOMY Left 09/27/2019  ? Procedure: ENDARTERECTOMY CAROTID LEFT;  Surgeon: Serafina Mitchell, MD;  Location: [facility];  Service: Vascular;  Laterality: Left;  ? HERNIA REPAIR  2007  ? HERNIA REPAIR  2015  ? PATCH ANGIOPLASTY Left 09/27/2019  ? Procedure: Patch Angioplasty Using Rueben Bash;  Surgeon: Serafina Mitchell, MD;  Location: [facility];  Service: Vascular;  Laterality: Left;  ? REPLACEMENT TOTAL KNEE  2016  ? TONSILLECTOMY    ? ? ?FAMILY HISTORY ?Family History  ?Problem Relation Age of Onset  ? Kidney disease Mother   ? Hypertension Mother   ? Glaucoma Mother   ? Heart failure Father   ? Emphysema Father   ? Asthma Father   ? Hypertension Father   ? Heart disease Father   ? COPD Brother   ? Allergic rhinitis Neg Hx   ? Angioedema Neg Hx   ? Atopy Neg Hx   ? Eczema Neg Hx   ? Immunodeficiency Neg Hx   ? Urticaria Neg Hx   ? ?SOCIAL HISTORY ?Social History  ? ?Tobacco Use  ? Smoking status: Former  ?  Packs/day: 1.50  ?  Years: 30.00  ?  Pack years: 45.00  ?  Types: Cigarettes  ?  Quit date: 2009  ?  Years since quitting: 14.3  ? Smokeless tobacco: Never  ?Vaping Use  ? Vaping Use: Never used  ?Substance Use Topics  ? Alcohol use: Yes  ?  Comment: social  ? Drug use: Never  ?  ? ?  ?OPHTHALMIC EXAM: ? ?Base Eye Exam   ? ? Visual Acuity (Snellen - Linear)   ? ?   Right Left  ? Dist [place] 20/40 -2 20/30  ? Dist ph [place] 20/25 +1 20/25  ?Patient didn't bring glasses. ? ?  ?  ? ? Tonometry (Tonopen, 9:50 AM)   ? ?   Right Left  ? Pressure 19 16  ? ?  ?  ? ? Pupils   ? ?   Dark Light Shape React APD  ? Right 4 3 Round Brisk None  ? Left 4 3 Round Brisk None  ? ?  ?  ? ? Visual Fields (Counting fingers)   ? ?   Left Right  ?  Full Full  ? ?  ?  ? ? Extraocular Movement   ? ?   Right Left  ?  Full, Ortho Full, Ortho  ? ?  ?  ? ? Neuro/Psych   ? ? Oriented x3: Yes  ?  Mood/Affect: Normal  ? ?  ?  ? ? Dilation   ? ? Both eyes: 1.0% Mydriacyl,  2.5% Phenylephrine @ 9:50 AM  ? ?  ?  ? ?  ? ?Slit Lamp and Fundus Exam   ? ? Slit Lamp Exam   ? ?   Right Left  ? Lids/Lashes Dermatochalasis - upper lid, mild Meibomian gland dysfunction Dermatochalasis - upper lid, Meibomian gland dysfunction, mild Telangiectasia  ? Conjunctiva/Sclera White and quiet white and quiet  ? Cornea mild EBMD, trace Punctate epithelial erosions mild EBMD, trace Punctate epithelial erosions  ? Anterior Chamber Deep and quiet Deep and quiet  ? Iris Round and dilated Round and dilated  ? Lens 2-3+ Nuclear sclerosis with mild brunescence, 2-3+ Cortical cataract 3+ Nuclear sclerosis with mild brunescence, 3+ Cortical cataract  ? Anterior Vitreous Mild Vitreous syneresis, Posterior vitreous detachment Mild Vitreous syneresis  ? ?  ?  ? ? Fundus Exam   ? ?   Right Left  ? Disc Pink and Sharp, narrow inferior rim, +cupping Pink and Sharp, +cupping, focal temporal PPP  ? C/D Ratio 0.75 0.7  ? Macula Flat, Blunted foveal reflex, mild Retinal pigment epithelial mottling and clumping, No heme or edema Flat, Blunted foveal reflex, mild Retinal pigment epithelial mottling and clumping, No heme or edema  ? Vessels attenuated, mild Copper wiring, mild AV crossing changes attenuated, mild Copper wiring, mild AV crossing changes, mild tortuosity  ? Periphery Attached, no heme  Attached, no heme  ? ?  ?  ? ?  ? ?Refraction   ? ? Wearing Rx   ? ?   Sphere Cylinder Axis  ? Right -0.50 +1.00 013  ? Left [place] +1.25 157  ? ?  ?  ? ?  ? ?IMAGING AND PROCEDURES  ?Imaging and Procedures for '@TODAY'$ @ ? ?OCT, Retina - OU - Both Eyes   ? ?   ?Right Eye ?Quality was good. Central Foveal Thickness: 262. Progression has been stable. Findings include normal foveal contour, no IRF, no SRF.  ? ?Left Eye ?Quality was good. Central Foveal Thickness: 263. Progression has been stable. Findings include normal foveal contour, no IRF, no SRF.   ? ?Notes ?*Images captured and stored on drive ? ?Diagnosis / Impression:  ?NFP, no IRF/SRF OU ? ?Clinical management:  ?See below ? ?Abbreviations: NFP - Normal foveal profile. CME - cystoid macular edema. PED - pigment epit

## 2022-01-27 ENCOUNTER — Encounter (INDEPENDENT_AMBULATORY_CARE_PROVIDER_SITE_OTHER): Payer: Self-pay | Admitting: Family Medicine

## 2022-01-27 ENCOUNTER — Ambulatory Visit (INDEPENDENT_AMBULATORY_CARE_PROVIDER_SITE_OTHER): Payer: Medicare Other | Admitting: Family Medicine

## 2022-01-27 VITALS — BP 136/43 | Temp 97.6°F | Ht 62.0 in | Wt 213.0 lb

## 2022-01-27 DIAGNOSIS — R7303 Prediabetes: Secondary | ICD-10-CM

## 2022-01-27 DIAGNOSIS — I10 Essential (primary) hypertension: Secondary | ICD-10-CM

## 2022-01-27 DIAGNOSIS — E669 Obesity, unspecified: Secondary | ICD-10-CM | POA: Diagnosis not present

## 2022-01-27 DIAGNOSIS — Z6839 Body mass index (BMI) 39.0-39.9, adult: Secondary | ICD-10-CM

## 2022-01-27 DIAGNOSIS — E559 Vitamin D deficiency, unspecified: Secondary | ICD-10-CM

## 2022-01-28 ENCOUNTER — Ambulatory Visit (INDEPENDENT_AMBULATORY_CARE_PROVIDER_SITE_OTHER): Payer: Medicare Other | Admitting: Ophthalmology

## 2022-01-28 ENCOUNTER — Encounter (INDEPENDENT_AMBULATORY_CARE_PROVIDER_SITE_OTHER): Payer: Self-pay | Admitting: Ophthalmology

## 2022-01-28 DIAGNOSIS — H3582 Retinal ischemia: Secondary | ICD-10-CM | POA: Diagnosis not present

## 2022-01-28 DIAGNOSIS — I10 Essential (primary) hypertension: Secondary | ICD-10-CM | POA: Diagnosis not present

## 2022-01-28 DIAGNOSIS — H25813 Combined forms of age-related cataract, bilateral: Secondary | ICD-10-CM

## 2022-01-28 DIAGNOSIS — H35033 Hypertensive retinopathy, bilateral: Secondary | ICD-10-CM

## 2022-01-28 DIAGNOSIS — H40003 Preglaucoma, unspecified, bilateral: Secondary | ICD-10-CM | POA: Diagnosis not present

## 2022-01-28 DIAGNOSIS — I6522 Occlusion and stenosis of left carotid artery: Secondary | ICD-10-CM

## 2022-01-28 DIAGNOSIS — H539 Unspecified visual disturbance: Secondary | ICD-10-CM

## 2022-01-28 LAB — HEMOGLOBIN A1C
Est. average glucose Bld gHb Est-mCnc: 114 mg/dL
Hgb A1c MFr Bld: 5.6 % (ref 4.8–5.6)

## 2022-01-28 LAB — CBC WITH DIFFERENTIAL/PLATELET
Basophils Absolute: 0 10*3/uL (ref 0.0–0.2)
Basos: 0 %
EOS (ABSOLUTE): 0.3 10*3/uL (ref 0.0–0.4)
Eos: 6 %
Hematocrit: 40.7 % (ref 34.0–46.6)
Hemoglobin: 12.4 g/dL (ref 11.1–15.9)
Immature Grans (Abs): 0 10*3/uL (ref 0.0–0.1)
Immature Granulocytes: 0 %
Lymphocytes Absolute: 1.2 10*3/uL (ref 0.7–3.1)
Lymphs: 25 %
MCH: 20.2 pg — ABNORMAL LOW (ref 26.6–33.0)
MCHC: 30.5 g/dL — ABNORMAL LOW (ref 31.5–35.7)
MCV: 66 fL — ABNORMAL LOW (ref 79–97)
Monocytes Absolute: 0.6 10*3/uL (ref 0.1–0.9)
Monocytes: 12 %
Neutrophils Absolute: 2.6 10*3/uL (ref 1.4–7.0)
Neutrophils: 57 %
Platelets: 279 10*3/uL (ref 150–450)
RBC: 6.15 x10E6/uL — ABNORMAL HIGH (ref 3.77–5.28)
RDW: 17.8 % — ABNORMAL HIGH (ref 11.7–15.4)
WBC: 4.7 10*3/uL (ref 3.4–10.8)

## 2022-01-28 LAB — CMP14+EGFR
ALT: 33 IU/L — ABNORMAL HIGH (ref 0–32)
AST: 29 IU/L (ref 0–40)
Albumin/Globulin Ratio: 2.1 (ref 1.2–2.2)
Albumin: 4.4 g/dL (ref 3.7–4.7)
Alkaline Phosphatase: 70 IU/L (ref 44–121)
BUN/Creatinine Ratio: 26 (ref 12–28)
BUN: 14 mg/dL (ref 8–27)
Bilirubin Total: 0.6 mg/dL (ref 0.0–1.2)
CO2: 27 mmol/L (ref 20–29)
Calcium: 9.7 mg/dL (ref 8.7–10.3)
Chloride: 100 mmol/L (ref 96–106)
Creatinine, Ser: 0.54 mg/dL — ABNORMAL LOW (ref 0.57–1.00)
Globulin, Total: 2.1 g/dL (ref 1.5–4.5)
Glucose: 100 mg/dL — ABNORMAL HIGH (ref 70–99)
Potassium: 4.7 mmol/L (ref 3.5–5.2)
Sodium: 140 mmol/L (ref 134–144)
Total Protein: 6.5 g/dL (ref 6.0–8.5)
eGFR: 96 mL/min/{1.73_m2} (ref >59)

## 2022-01-28 LAB — TSH: TSH: 2.45 u[IU]/mL (ref 0.450–4.500)

## 2022-01-28 LAB — INSULIN, RANDOM: INSULIN: 7 u[IU]/mL (ref 2.6–24.9)

## 2022-01-28 LAB — VITAMIN D 25 HYDROXY (VIT D DEFICIENCY, FRACTURES): Vit D, 25-Hydroxy: 65 ng/mL (ref 30.0–100.0)

## 2022-02-04 DIAGNOSIS — R9389 Abnormal findings on diagnostic imaging of other specified body structures: Secondary | ICD-10-CM | POA: Diagnosis not present

## 2022-02-09 NOTE — Progress Notes (Signed)
? ? ? ?Chief Complaint:  ? ?OBESITY ?Kristina [name] is here to discuss her progress with her obesity treatment plan along with follow-up of her obesity related diagnoses. Kristina [name] is on the Category 2 Plan and states she is following her eating plan approximately 90% of the time. Kristina [name] states she is walking the dog for 35 minutes 7 times per week. ? ?Today's visit was #: 12 ?Starting weight: 228 lbs ?Starting date: 05/27/2021 ?Today's weight: 213 lbs ?Today's date: 01/27/2022 ?Total lbs lost to date: 52 ?Total lbs lost since last in-office visit: 0 ? ?Interim History: Kristina [name] continues to work on diet and exercise. Her stress level is high lately. She will be traveling with her sister soon which may make weight loss difficult.  ? ?Subjective:  ? ?1. Essential hypertension ?Niajah's diastolic blood pressure is lower then expected today. She is not having symptoms of hypotension. She is stable on her medications.  ? ?2. Pre-diabetes ?Kristina [name] is stable on her diet, and she is due for labs.  ? ?3. Vitamin D deficiency, unspecified ?Kristina [name] is stable on Vitamin D, and she is due for labs.  ? ?Assessment/Plan:  ? ?1. Essential hypertension ?Kristina [name] is to continue with losartan, and with diet and exercise. She agreed to contact her PCP if symptoms present and we will recheck her blood pressure in 1 month.  ? ?- CBC with Differential/Platelet ?- CMP14+EGFR ?- TSH ? ?2. Pre-diabetes ?We will check labs today. Kristina [name] will continue to work on weight loss, exercise, and decreasing simple carbohydrates to help decrease the risk of diabetes.  ? ?- Insulin, random ?- Hemoglobin A1c ? ?3. Vitamin D deficiency, unspecified ?Low Vitamin D level contributes to fatigue and are associated with obesity, breast, and colon cancer. We will check labs today, and Kristina [name] will follow-up for routine testing of Vitamin D, at least 2-3 times per year to avoid over-replacement. ? ?- VITAMIN D 25 Hydroxy (Vit-D Deficiency,  Fractures) ? ?4. Obesity, current BMI 39.1 ?Kristina [name] is currently in the action stage of change. As such, her goal is to continue with weight loss efforts. She has agreed to the Category 2 Plan.  ? ?Exercise goals: As is.  ? ?Behavioral modification strategies: increasing lean protein intake and travel eating strategies. ? ?Kristina [name] has agreed to follow-up with our clinic in 3 to 4 weeks. She was informed of the importance of frequent follow-up visits to maximize her success with intensive lifestyle modifications for her multiple health conditions.  ? ?Kristina [name] was informed we would discuss her lab results at her next visit unless there is a critical issue that needs to be addressed sooner. Kristina [name] agreed to keep her next visit at the agreed upon time to discuss these results. ? ?Objective:  ? ?Blood pressure (!) 136/43, temperature 97.6 ?F (36.4 ?C), height $RemoveBef'5\' 2"'dmDdcTaZCX$  (1.575 m), weight 213 lb (96.6 kg), SpO2 97 %. ?Body mass index is 38.96 kg/m?. ? ?General: Cooperative, alert, well developed, in no acute distress. ?HEENT: Conjunctivae and lids unremarkable. ?Cardiovascular: Regular rhythm.  ?Lungs: Normal work of breathing. ?Neurologic: No focal deficits.  ? ?Lab Results  ?Component Value Date  ? CREATININE 0.54 (L) 01/27/2022  ? BUN 14 01/27/2022  ? NA 140 01/27/2022  ? K 4.7 01/27/2022  ? CL 100 01/27/2022  ? CO2 27 01/27/2022  ? ?Lab Results  ?Component Value Date  ? ALT 33 (H) 01/27/2022  ? AST 29 01/27/2022  ? ALKPHOS 70 01/27/2022  ? BILITOT 0.6 01/27/2022  ? ?Lab Results  ?Component Value  Date  ? HGBA1C 5.6 01/27/2022  ? HGBA1C 5.7 (H) 09/23/2021  ? HGBA1C 5.7 (H) 05/27/2021  ? HGBA1C 5.5 12/15/2018  ? ?Lab Results  ?Component Value Date  ? INSULIN 7.0 01/27/2022  ? INSULIN 5.1 09/23/2021  ? INSULIN 6.3 05/27/2021  ? INSULIN 7.1 12/15/2018  ? ?Lab Results  ?Component Value Date  ? TSH 2.450 01/27/2022  ? ?Lab Results  ?Component Value Date  ? CHOL 169 09/23/2021  ? HDL 66 09/23/2021  ? [place] 85 09/23/2021   ? TRIG 102 09/23/2021  ? CHOLHDL 2.5 12/22/2019  ? ?Lab Results  ?Component Value Date  ? VD25OH 65.0 01/27/2022  ? VD25OH 67.6 09/23/2021  ? VD25OH 60.6 05/27/2021  ? ?Lab Results  ?Component Value Date  ? WBC 4.7 01/27/2022  ? HGB 12.4 01/27/2022  ? HCT 40.7 01/27/2022  ? MCV 66 (L) 01/27/2022  ? PLT 279 01/27/2022  ? ?No results found for: IRON, TIBC, FERRITIN ? ?Obesity Behavioral Intervention:  ? ?Approximately 15 minutes were spent on the discussion below. ? ?ASK: ?We discussed the diagnosis of obesity with Kristina [name] today and Kristina [name] agreed to give [name] permission to discuss obesity behavioral modification therapy today. ? ?ASSESS: ?Kristina [name] has the diagnosis of obesity and her BMI today is 39.1. Kristina [name] is in the action stage of change.  ? ?ADVISE: ?Kristina [name] was educated on the multiple health risks of obesity as well as the benefit of weight loss to improve her health. She was advised of the need for long term treatment and the importance of lifestyle modifications to improve her current health and to decrease her risk of future health problems. ? ?AGREE: ?Multiple dietary modification options and treatment options were discussed and Kristina [name] agreed to follow the recommendations documented in the above note. ? ?ARRANGE: ?Kristina [name] was educated on the importance of frequent visits to treat obesity as outlined per CMS and USPSTF guidelines and agreed to schedule her next follow up appointment today. ? ?Attestation Statements:  ? ?Reviewed by clinician on day of visit: allergies, medications, problem list, medical history, surgical history, family history, social history, and previous encounter notes. ? ? ?I, Trixie Dredge, am acting as transcriptionist for Dennard Nip, MD. ? ?I have reviewed the above documentation for accuracy and completeness, and I agree with the above. -  Dennard Nip, MD ? ? ?

## 2022-02-23 DIAGNOSIS — R9389 Abnormal findings on diagnostic imaging of other specified body structures: Secondary | ICD-10-CM | POA: Diagnosis not present

## 2022-02-24 ENCOUNTER — Ambulatory Visit (INDEPENDENT_AMBULATORY_CARE_PROVIDER_SITE_OTHER): Payer: Medicare Other | Admitting: Family Medicine

## 2022-02-24 ENCOUNTER — Encounter (INDEPENDENT_AMBULATORY_CARE_PROVIDER_SITE_OTHER): Payer: Self-pay | Admitting: Family Medicine

## 2022-02-24 VITALS — BP 146/77 | HR 61 | Temp 97.9°F | Ht 62.0 in | Wt 212.0 lb

## 2022-02-24 DIAGNOSIS — E669 Obesity, unspecified: Secondary | ICD-10-CM

## 2022-02-24 DIAGNOSIS — R7303 Prediabetes: Secondary | ICD-10-CM | POA: Diagnosis not present

## 2022-02-24 DIAGNOSIS — I10 Essential (primary) hypertension: Secondary | ICD-10-CM

## 2022-02-24 DIAGNOSIS — Z6838 Body mass index (BMI) 38.0-38.9, adult: Secondary | ICD-10-CM

## 2022-03-09 NOTE — Progress Notes (Signed)
Chief Complaint:   OBESITY Kristina [name] is here to discuss her progress with her obesity treatment plan along with follow-up of her obesity related diagnoses. Kristina [name] is on the Category 2 Plan and states she is following her eating plan approximately 80% of the time. Kristina [name] states she is walking for 35-45 minutes 7 times per week.  Today's visit was #: 27 Starting weight: 228 lbs Starting date: 05/27/2021 Today's weight: 212 lbs Today's date: 02/24/2022 Total lbs lost to date: 16 Total lbs lost since last in-office visit: 1  Interim History: Kristina [name] continues to work on weight loss. She has good news that her endometrial biopsy was negative. She is struggling active and walking most days.   Subjective:   1. Pre-diabetes Kristina [name]'s A1c was at goal at 5.6. It is improving with diet and weight loss. I discussed labs with the patient today.   2. Essential hypertension Kristina [name] forgot her blood pressure medications this morning. Her blood pressure is mildly elevated.   Assessment/Plan:   1. Pre-diabetes Man will continue her diet, exercise, and weight losss to help decrease the risk of diabetes.   2. Essential hypertension Kristina [name] will continue her diet and medications, and we will recheck her blood pressure in 4 weeks.   3. Obesity, Current BMI 38.9 Kristina [name] is currently in the action stage of change. As such, her goal is to continue with weight loss efforts. She has agreed to the Category 2 Plan.   Exercise goals: As is.   Behavioral modification strategies: increasing lean protein intake.  Kristina [name] has agreed to follow-up with our clinic in 4 weeks. She was informed of the importance of frequent follow-up visits to maximize her success with intensive lifestyle modifications for her multiple health conditions.   Objective:   Blood pressure (!) 146/77, pulse 61, temperature 97.9 F (36.6 C), height '5\' 2"'$  (1.575 m), weight 212 lb (96.2 kg), SpO2 97 %. Body  mass index is 38.78 kg/m.  General: Cooperative, alert, well developed, in no acute distress. HEENT: Conjunctivae and lids unremarkable. Cardiovascular: Regular rhythm.  Lungs: Normal work of breathing. Neurologic: No focal deficits.   Lab Results  Component Value Date   CREATININE 0.54 (L) 01/27/2022   BUN 14 01/27/2022   NA 140 01/27/2022   K 4.7 01/27/2022   CL 100 01/27/2022   CO2 27 01/27/2022   Lab Results  Component Value Date   ALT 33 (H) 01/27/2022   AST 29 01/27/2022   ALKPHOS 70 01/27/2022   BILITOT 0.6 01/27/2022   Lab Results  Component Value Date   HGBA1C 5.6 01/27/2022   HGBA1C 5.7 (H) 09/23/2021   HGBA1C 5.7 (H) 05/27/2021   HGBA1C 5.5 12/15/2018   Lab Results  Component Value Date   INSULIN 7.0 01/27/2022   INSULIN 5.1 09/23/2021   INSULIN 6.3 05/27/2021   INSULIN 7.1 12/15/2018   Lab Results  Component Value Date   TSH 2.450 01/27/2022   Lab Results  Component Value Date   CHOL 169 09/23/2021   HDL 66 09/23/2021   LDLCALC 85 09/23/2021   TRIG 102 09/23/2021   CHOLHDL 2.5 12/22/2019   Lab Results  Component Value Date   VD25OH 65.0 01/27/2022   VD25OH 67.6 09/23/2021   VD25OH 60.6 05/27/2021   Lab Results  Component Value Date   WBC 4.7 01/27/2022   HGB 12.4 01/27/2022   HCT 40.7 01/27/2022   MCV 66 (L) 01/27/2022   PLT 279 01/27/2022   No results found for: IRON,  TIBC, FERRITIN  Attestation Statements:   Reviewed by clinician on day of visit: allergies, medications, problem list, medical history, surgical history, family history, social history, and previous encounter notes.  Time spent on visit including pre-visit chart review and post-visit care and charting was 32 minutes.   I, Trixie Dredge, am acting as transcriptionist for Dennard Nip, MD.  I have reviewed the above documentation for accuracy and completeness, and I agree with the above. -  Dennard Nip, MD

## 2022-03-27 ENCOUNTER — Encounter (INDEPENDENT_AMBULATORY_CARE_PROVIDER_SITE_OTHER): Payer: Self-pay | Admitting: Family Medicine

## 2022-03-30 DIAGNOSIS — R9389 Abnormal findings on diagnostic imaging of other specified body structures: Secondary | ICD-10-CM | POA: Diagnosis not present

## 2022-03-31 ENCOUNTER — Ambulatory Visit (INDEPENDENT_AMBULATORY_CARE_PROVIDER_SITE_OTHER): Payer: Medicare Other | Admitting: Family Medicine

## 2022-04-02 DIAGNOSIS — D123 Benign neoplasm of transverse colon: Secondary | ICD-10-CM | POA: Diagnosis not present

## 2022-04-02 DIAGNOSIS — D120 Benign neoplasm of cecum: Secondary | ICD-10-CM | POA: Diagnosis not present

## 2022-04-02 DIAGNOSIS — D122 Benign neoplasm of ascending colon: Secondary | ICD-10-CM | POA: Diagnosis not present

## 2022-04-02 DIAGNOSIS — K5730 Diverticulosis of large intestine without perforation or abscess without bleeding: Secondary | ICD-10-CM | POA: Diagnosis not present

## 2022-04-02 DIAGNOSIS — Z09 Encounter for follow-up examination after completed treatment for conditions other than malignant neoplasm: Secondary | ICD-10-CM | POA: Diagnosis not present

## 2022-04-02 DIAGNOSIS — K648 Other hemorrhoids: Secondary | ICD-10-CM | POA: Diagnosis not present

## 2022-04-02 DIAGNOSIS — Z86010 Personal history of colonic polyps: Secondary | ICD-10-CM | POA: Diagnosis not present

## 2022-04-06 DIAGNOSIS — D123 Benign neoplasm of transverse colon: Secondary | ICD-10-CM | POA: Diagnosis not present

## 2022-04-06 DIAGNOSIS — D122 Benign neoplasm of ascending colon: Secondary | ICD-10-CM | POA: Diagnosis not present

## 2022-04-14 ENCOUNTER — Ambulatory Visit
Admission: RE | Admit: 2022-04-14 | Discharge: 2022-04-14 | Disposition: A | Discharge status: Home / Self Care | Payer: Medicare Other | Source: Ambulatory Visit | Attending: Family Medicine | Admitting: Family Medicine

## 2022-04-14 ENCOUNTER — Other Ambulatory Visit: Payer: Self-pay | Admitting: Obstetrics and Gynecology

## 2022-04-14 ENCOUNTER — Encounter (HOSPITAL_COMMUNITY): Payer: Self-pay | Admitting: Obstetrics and Gynecology

## 2022-04-14 DIAGNOSIS — E2839 Other primary ovarian failure: Secondary | ICD-10-CM

## 2022-04-14 DIAGNOSIS — Z780 Asymptomatic menopausal state: Secondary | ICD-10-CM | POA: Diagnosis not present

## 2022-04-14 DIAGNOSIS — N950 Postmenopausal bleeding: Secondary | ICD-10-CM

## 2022-04-14 NOTE — Progress Notes (Signed)
PCP - Dr. Sabra Heck Cardiologist - Dr. Gwenlyn Found EKG - 05/27/21 Chest x-ray -  ECHO - 09/08/21 Cardiac Cath -  CPAP -   Blood Thinner Instructions:  Aspirin Instructions: last dose ASA 7/11 ERAS Protcol -  COVID TEST-   Anesthesia review: n/a  -------------  SDW INSTRUCTIONS:  Your procedure is scheduled on 7/12. Please report to [facility] Main Entrance "A" at 1050 A.M., and check in at the Admitting office. Call this number if you have problems the morning of surgery: [phone]   Remember: Do not eat after midnight the night before your surgery  You may drink clear liquids until 10:15 AM the morning of your surgery.   Clear liquids allowed are: Water, Non-Citrus Juices (without pulp), Carbonated Beverages, Clear Tea, Black Coffee Only, and Gatorade   Medications to take morning of surgery with a sip of water include: acetaminophen (TYLENOL) if needed atorvastatin (LIPITOR) escitalopram (LEXAPRO) pravastatin (PRAVACHOL)    As of today, STOP taking any Aspirin (unless otherwise instructed by your surgeon), Aleve, Naproxen, Ibuprofen, Motrin, Advil, Goody's, BC's, all herbal medications, fish oil, and all vitamins.    The Morning of Surgery Do not wear jewelry, make-up or nail polish. Do not wear lotions, powders, or perfumes/colognes, or deodorant Do not bring valuables to the hospital. [facility] is not responsible for any belongings or valuables.  If you are a smoker, DO NOT Smoke 24 hours prior to surgery  If you wear a CPAP at night please bring your mask the morning of surgery   Remember that you must have someone to transport you home after your surgery, and remain with you for 24 hours if you are discharged the same day.  Please bring cases for contacts, glasses, hearing aids, dentures or bridgework because it cannot be worn into surgery.   Patients discharged the day of surgery will not be allowed to drive home.   Please shower the NIGHT BEFORE/MORNING OF  SURGERY (use antibacterial soap like DIAL soap if possible). Wear comfortable clothes the morning of surgery. Oral Hygiene is also important to reduce your risk of infection.  Remember - BRUSH YOUR TEETH THE MORNING OF SURGERY WITH YOUR REGULAR TOOTHPASTE  Patient denies shortness of breath, fever, cough and chest pain.

## 2022-04-15 ENCOUNTER — Other Ambulatory Visit: Payer: Self-pay | Admitting: Obstetrics and Gynecology

## 2022-04-15 ENCOUNTER — Encounter (HOSPITAL_COMMUNITY): Payer: Self-pay | Admitting: Obstetrics and Gynecology

## 2022-04-15 ENCOUNTER — Other Ambulatory Visit: Payer: Self-pay

## 2022-04-15 ENCOUNTER — Ambulatory Visit (HOSPITAL_BASED_OUTPATIENT_CLINIC_OR_DEPARTMENT_OTHER): Payer: Medicare Other | Admitting: Anesthesiology

## 2022-04-15 ENCOUNTER — Ambulatory Visit (HOSPITAL_COMMUNITY)
Admission: RE | Admit: 2022-04-15 | Discharge: 2022-04-15 | Disposition: A | Discharge status: Home / Self Care | Payer: Medicare Other | Attending: Obstetrics and Gynecology | Admitting: Obstetrics and Gynecology

## 2022-04-15 ENCOUNTER — Encounter (HOSPITAL_COMMUNITY)
Admission: RE | Disposition: A | Discharge status: Home / Self Care | Payer: Self-pay | Source: Home / Self Care | Attending: Obstetrics and Gynecology

## 2022-04-15 ENCOUNTER — Ambulatory Visit (HOSPITAL_COMMUNITY): Payer: Medicare Other | Admitting: Anesthesiology

## 2022-04-15 DIAGNOSIS — I10 Essential (primary) hypertension: Secondary | ICD-10-CM

## 2022-04-15 DIAGNOSIS — F419 Anxiety disorder, unspecified: Secondary | ICD-10-CM | POA: Diagnosis not present

## 2022-04-15 DIAGNOSIS — Z87891 Personal history of nicotine dependence: Secondary | ICD-10-CM

## 2022-04-15 DIAGNOSIS — N840 Polyp of corpus uteri: Secondary | ICD-10-CM

## 2022-04-15 DIAGNOSIS — M1990 Unspecified osteoarthritis, unspecified site: Secondary | ICD-10-CM

## 2022-04-15 DIAGNOSIS — N950 Postmenopausal bleeding: Secondary | ICD-10-CM | POA: Insufficient documentation

## 2022-04-15 DIAGNOSIS — Z6841 Body Mass Index (BMI) 40.0 and over, adult: Secondary | ICD-10-CM | POA: Diagnosis not present

## 2022-04-15 HISTORY — PX: DILATATION & CURETTAGE/HYSTEROSCOPY WITH MYOSURE: SHX6511

## 2022-04-15 LAB — CBC
HCT: 39 % (ref 36.0–46.0)
Hemoglobin: 12 g/dL (ref 12.0–15.0)
MCH: 20.3 pg — ABNORMAL LOW (ref 26.0–34.0)
MCHC: 30.8 g/dL (ref 30.0–36.0)
MCV: 66.1 fL — ABNORMAL LOW (ref 80.0–100.0)
Platelets: 254 10*3/uL (ref 150–400)
RBC: 5.9 MIL/uL — ABNORMAL HIGH (ref 3.87–5.11)
RDW: 17.8 % — ABNORMAL HIGH (ref 11.5–15.5)
WBC: 7.2 10*3/uL (ref 4.0–10.5)
nRBC: 0 % (ref 0.0–0.2)

## 2022-04-15 LAB — BASIC METABOLIC PANEL
Anion gap: 12 (ref 5–15)
BUN: 22 mg/dL (ref 8–23)
CO2: 26 mmol/L (ref 22–32)
Calcium: 9.1 mg/dL (ref 8.9–10.3)
Chloride: 102 mmol/L (ref 98–111)
Creatinine, Ser: 0.56 mg/dL (ref 0.44–1.00)
GFR, Estimated: >60 mL/min (ref >60)
Glucose, Bld: 94 mg/dL (ref 70–99)
Potassium: 4.2 mmol/L (ref 3.5–5.1)
Sodium: 140 mmol/L (ref 135–145)

## 2022-04-15 SURGERY — DILATATION & CURETTAGE/HYSTEROSCOPY WITH MYOSURE
Anesthesia: General | Site: Uterus

## 2022-04-15 MED ORDER — HYDROMORPHONE HCL 1 MG/ML IJ SOLN: 0.2500 mg | INTRAMUSCULAR | Status: DC | PRN

## 2022-04-15 MED ORDER — ATROPINE SULFATE 0.4 MG/ML IV SOSY
PREFILLED_SYRINGE | INTRAMUSCULAR | Status: DC | PRN
  Administered 2022-04-15: .2 mg via INTRAVENOUS

## 2022-04-15 MED ORDER — ORAL CARE MOUTH RINSE: 15.0000 mL | Freq: Once | OROMUCOSAL | Status: AC

## 2022-04-15 MED ORDER — LACTATED RINGERS IV SOLN: INTRAVENOUS | Status: DC

## 2022-04-15 MED ORDER — EPHEDRINE SULFATE-NACL 50-0.9 MG/10ML-% IV SOSY
PREFILLED_SYRINGE | INTRAVENOUS | Status: DC | PRN
  Administered 2022-04-15: 10 mg via INTRAVENOUS
  Administered 2022-04-15: 5 mg via INTRAVENOUS
  Administered 2022-04-15: 10 mg via INTRAVENOUS

## 2022-04-15 MED ORDER — OXYCODONE HCL 5 MG/5ML PO SOLN: 5.0000 mg | Freq: Once | ORAL | Status: DC | PRN

## 2022-04-15 MED ORDER — PROPOFOL 10 MG/ML IV BOLUS
INTRAVENOUS | Status: DC | PRN
  Administered 2022-04-15 (×2): 100 mg via INTRAVENOUS

## 2022-04-15 MED ORDER — CHLORHEXIDINE GLUCONATE 0.12 % MT SOLN
15.0000 mL | Freq: Once | OROMUCOSAL | Status: AC
  Administered 2022-04-15: 15 mL via OROMUCOSAL
  Filled 2022-04-15: qty 15

## 2022-04-15 MED ORDER — POVIDONE-IODINE 10 % EX SWAB
2.0000 | Freq: Once | CUTANEOUS | Status: AC
  Administered 2022-04-15: 2 via TOPICAL

## 2022-04-15 MED ORDER — DEXAMETHASONE SODIUM PHOSPHATE 10 MG/ML IJ SOLN
INTRAMUSCULAR | Status: DC | PRN
  Administered 2022-04-15: 5 mg via INTRAVENOUS

## 2022-04-15 MED ORDER — OXYCODONE HCL 5 MG PO TABS: 5.0000 mg | ORAL_TABLET | Freq: Once | ORAL | Status: DC | PRN

## 2022-04-15 MED ORDER — PROPOFOL 10 MG/ML IV BOLUS
INTRAVENOUS | Status: AC
  Filled 2022-04-15: qty 20

## 2022-04-15 MED ORDER — ATROPINE SULFATE 0.4 MG/ML IV SOLN
INTRAVENOUS | Status: AC
  Filled 2022-04-15: qty 1

## 2022-04-15 MED ORDER — GLYCOPYRROLATE PF 0.2 MG/ML IJ SOSY
PREFILLED_SYRINGE | INTRAMUSCULAR | Status: DC | PRN
  Administered 2022-04-15: .2 mg via INTRAVENOUS

## 2022-04-15 MED ORDER — GLYCOPYRROLATE PF 0.2 MG/ML IJ SOSY
PREFILLED_SYRINGE | INTRAMUSCULAR | Status: AC
  Filled 2022-04-15: qty 1

## 2022-04-15 MED ORDER — IBUPROFEN 600 MG PO TABS: 600.0000 mg | ORAL_TABLET | Freq: Four times a day (QID) | ORAL | 0 refills | Status: DC | PRN

## 2022-04-15 MED ORDER — ONDANSETRON HCL 4 MG/2ML IJ SOLN
INTRAMUSCULAR | Status: DC | PRN
  Administered 2022-04-15: 4 mg via INTRAVENOUS

## 2022-04-15 MED ORDER — MIDAZOLAM HCL 2 MG/2ML IJ SOLN
INTRAMUSCULAR | Status: DC | PRN
  Administered 2022-04-15: 1 mg via INTRAVENOUS

## 2022-04-15 MED ORDER — EPHEDRINE 5 MG/ML INJ
INTRAVENOUS | Status: AC
Start: 2022-04-15 — End: ?
  Filled 2022-04-15: qty 5

## 2022-04-15 MED ORDER — BUPIVACAINE HCL (PF) 0.25 % IJ SOLN
INTRAMUSCULAR | Status: AC
  Filled 2022-04-15: qty 30

## 2022-04-15 MED ORDER — FENTANYL CITRATE (PF) 250 MCG/5ML IJ SOLN
INTRAMUSCULAR | Status: AC
  Filled 2022-04-15: qty 5

## 2022-04-15 MED ORDER — PROMETHAZINE HCL 25 MG/ML IJ SOLN: 6.2500 mg | INTRAMUSCULAR | Status: DC | PRN

## 2022-04-15 MED ORDER — SODIUM CHLORIDE 0.9 % IR SOLN
Status: DC | PRN
  Administered 2022-04-15: 694 mL

## 2022-04-15 MED ORDER — SILVER NITRATE-POT NITRATE 75-25 % EX MISC
CUTANEOUS | Status: AC
  Filled 2022-04-15: qty 10

## 2022-04-15 MED ORDER — LIDOCAINE 2% (20 MG/ML) 5 ML SYRINGE
INTRAMUSCULAR | Status: DC | PRN
  Administered 2022-04-15: 60 mg via INTRAVENOUS

## 2022-04-15 MED ORDER — MIDAZOLAM HCL 2 MG/2ML IJ SOLN
INTRAMUSCULAR | Status: AC
  Filled 2022-04-15: qty 2

## 2022-04-15 MED ORDER — PHENYLEPHRINE 80 MCG/ML (10ML) SYRINGE FOR IV PUSH (FOR BLOOD PRESSURE SUPPORT)
PREFILLED_SYRINGE | INTRAVENOUS | Status: AC
  Filled 2022-04-15: qty 10

## 2022-04-15 MED ORDER — CELECOXIB 200 MG PO CAPS
400.0000 mg | ORAL_CAPSULE | ORAL | Status: AC
  Administered 2022-04-15: 400 mg via ORAL
  Filled 2022-04-15: qty 2

## 2022-04-15 MED ORDER — ACETAMINOPHEN 500 MG PO TABS
1000.0000 mg | ORAL_TABLET | ORAL | Status: AC
  Administered 2022-04-15: 1000 mg via ORAL
  Filled 2022-04-15: qty 2

## 2022-04-15 MED ORDER — PHENYLEPHRINE 80 MCG/ML (10ML) SYRINGE FOR IV PUSH (FOR BLOOD PRESSURE SUPPORT)
PREFILLED_SYRINGE | INTRAVENOUS | Status: DC | PRN
  Administered 2022-04-15 (×3): 80 ug via INTRAVENOUS

## 2022-04-15 MED ORDER — AMISULPRIDE (ANTIEMETIC) 5 MG/2ML IV SOLN
10.0000 mg | Freq: Once | INTRAVENOUS | Status: DC | PRN
Start: 2022-04-15 — End: 2022-04-15

## 2022-04-15 MED ORDER — FENTANYL CITRATE (PF) 250 MCG/5ML IJ SOLN
INTRAMUSCULAR | Status: DC | PRN
  Administered 2022-04-15 (×2): 25 ug via INTRAVENOUS

## 2022-04-15 MED ORDER — OXYCODONE HCL 5 MG PO TABS: 5.0000 mg | ORAL_TABLET | Freq: Four times a day (QID) | ORAL | 0 refills | Status: DC | PRN

## 2022-04-15 MED ORDER — BUPIVACAINE HCL (PF) 0.25 % IJ SOLN
INTRAMUSCULAR | Status: DC | PRN
  Administered 2022-04-15: 20 mL

## 2022-04-15 SURGICAL SUPPLY — 19 items
CANISTER SUCT 3000ML PPV (MISCELLANEOUS) ×1 IMPLANT
CATH ROBINSON RED A/P 16FR (CATHETERS) ×2 IMPLANT
DEVICE MYOSURE LITE (MISCELLANEOUS) ×1 IMPLANT
DEVICE MYOSURE REACH (MISCELLANEOUS) IMPLANT
DILATOR CANAL MILEX (MISCELLANEOUS) IMPLANT
GLOVE BIOGEL M 7.0 STRL (GLOVE) ×1 IMPLANT
GLOVE BIOGEL PI IND STRL 6.5 (GLOVE) ×1 IMPLANT
GLOVE BIOGEL PI INDICATOR 6.5 (GLOVE) ×1
GLOVE SURG ENC TEXT LTX SZ6.5 (GLOVE) ×1 IMPLANT
GLOVE SURG UNDER POLY LF SZ7 (GLOVE) ×2 IMPLANT
GOWN STRL REUS W/ TWL LRG LVL3 (GOWN DISPOSABLE) ×2 IMPLANT
GOWN STRL REUS W/TWL LRG LVL3 (GOWN DISPOSABLE) ×2
KIT PROCEDURE FLUENT (KITS) ×2 IMPLANT
KIT TURNOVER KIT B (KITS) ×2 IMPLANT
PACK VAGINAL MINOR WOMEN LF (CUSTOM PROCEDURE TRAY) ×2 IMPLANT
PAD OB MATERNITY 4.3X12.25 (PERSONAL CARE ITEMS) ×2 IMPLANT
SEAL ROD LENS SCOPE MYOSURE (ABLATOR) ×2 IMPLANT
TOWEL GREEN STERILE FF (TOWEL DISPOSABLE) ×4 IMPLANT
UNDERPAD 30X36 HEAVY ABSORB (UNDERPADS AND DIAPERS) ×2 IMPLANT

## 2022-04-15 NOTE — Transfer of Care (Signed)
Immediate Anesthesia Transfer of Care Note  Patient: Mame Twombly  Procedure(s) Performed: DILATATION & CURETTAGE/HYSTEROSCOPY WITH MYOSURE (Uterus)  Patient Location: PACU  Anesthesia Type:General  Level of Consciousness: awake, alert  and oriented  Airway & Oxygen Therapy: Patient Spontanous Breathing and Patient connected to nasal cannula oxygen  Post-op Assessment: Report given to RN and Post -op Vital signs reviewed and stable  Post vital signs: Reviewed and stable  Last Vitals:  Vitals Value Taken Time  BP 160/77   Temp    Pulse 61 04/15/22 1515  Resp 13 04/15/22 1515  SpO2 98 % 04/15/22 1515  Vitals shown include unvalidated device data.  Last Pain:  Vitals:   04/15/22 1141  TempSrc:   PainSc: 0-No pain         Complications: No notable events documented.

## 2022-04-15 NOTE — H&P (Signed)
Reason for Appointment   1. PreOp for 04/15/22/ postmenopausal bleeding/ endometrial polyp       History of Present Illness  General:          76 y/o presents for preop visit. Pt is schedule for a hysteroscopy D&C polypectomy with myosure on 04/15/2022 for the management of PMB and endometrial polyp.        she had an ultrasoud performed on 12/31/2021 that showed a uterus measuring 6.5 cm x 3.4 cm x 4.4 cm. A fibroid anterior submucosal measuring 1.4 cm is noted. The endometrium is 38m.. Her ovaries are normal bilaterally.         When seen on 02/04/2021 she reported 30 days ago. she was urinating and when she flushed the toilet she noticed it was full of blood. it occurred 3 times that day. 2 days later she noticed blood again. she has not noticed the blood unless she is urinating. the last episode was 2 weeks ago.        she went ot [facility] / had a cystoscopy and the work up was normal. she was seen by Dr. SNicki ReaperMcDermaid.        she had an endometrial biopsy on 02/04/2022 that revealed an endometrial polyp.     Current Medications  Taking  Airborne - Tablet Chewable 1 tablet by mouth once a day, Notes: OTC    Aspirin EC 81 MG Tablet Delayed Release 1 tablet Orally Once a day, Notes: OTC    Atorvastatin Calcium 20 mg Tablet TAKE ONE TABLET BY MOUTH EVERY MORNING     Dorzolamide HCl-Timolol Mal 22.3-6.8 MG/ML Solution 1 drop into both eyes Ophthalmic Twice a day, Notes: Groat/Opth    Elderberry 2 tablets Orally once a day, Notes: OTC    Escitalopram Oxalate 10 mg Tablet TAKE ONE TABLET BY MOUTH EVERY MORNING , Notes: MILLER    Glucosamine 500 MG Capsule 1 tablet by mouth once a day, Notes: OTC    hydroCHLOROthiazide 25 mg Tablet TAKE ONE TABLET BY MOUTH EVERY MORNING , Notes: MILLER    Losartan Potassium 100 mg Tablet TAKE ONE TABLET BY MOUTH EVERY MORNING , Notes: MILLER    Melatonin 10 MG Tablet 2 tablets by mouth at bedtime, Notes: OTC    Multivitamin -  Tablet 1 tablet by mouth Once a day, Notes: OTC (vitafusion women's))    Vitamin C 500 MG Tablet 1 tablet Orally Once a day, Notes: OTC    Vitamin D (Ergocalciferol) 1.25 MG (50000 UT) Capsule TAKE ONE CAPSULE BY MOUTH ONCE WEEKLY ON MONDAY , Notes: OTC    Medication List reviewed and reconciled with the patient         Past Medical History        Hyperlipemia.         Hypertension.         Torn right rotator cuff, healed with PT, L shoulder pain .         Osteoarthritis L shoulder,R knee, L spine .         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         Morbid obesity-htn, chol-OA.         Hx of tobacco use, ? how much , 2000 quit date ? screen for lung ca applicable.         Colon 12/2014 polyp, no path-tub ademoma, repeat 5 yrs 12/2019 .         Dexa 2013 nl .  10 yrs ago lost 160 lbs, retired in 2018 weight increased, on diet now to lose weight 01/2019.         Lumbar DJD, epidural in 12/2018 .         Wt loss management 12/2018 labs done in [facility].         Echo 09/2019 NL EF, diast dys, mild AS, LAE.         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         Carotid stenosis 10/2019.         Covid.        Surgical History         knee replacement, left 2016         hernia repair 2007/2015         cholecystectomy 2006         tonsillectomy 1963         appendectomy 1955         left carotid endarterectomy with bovine pericardial patch angioplasty - Dr. Trula Slade 09/2019       Family History   Father: deceased, diagnosed with COPD (chronic obstructive pulmonary disease)   Mother: deceased, kidney disease, kidney failure   Brother 1: alive, diagnosed with COPD (chronic obstructive pulmonary disease)   Sister 1: alive, half sibling   1 brother(s) , 1 sister(s) .        Social History  General:   Tobacco use  cigarettes: Former smoker, Quit in year 2000, Tobacco history last updated 03/30/2022, Vaping No. Alcohol:  yes, social. Caffeine: yes, coffee, tea daily. Recreational drug use: no. DIET: lacking, seen by Dr. Leafy Ro at [facility] - meal plan 1100-1200cal/day 70gm protein daily, low carb. Exercise: yes, walks daily w/dog, 35-64mn every morning also goes to the gym. Marital Status: single, SZenon Mayois her daughter. 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   Periods : postmenopausal.   LMP 2003.   Denies H/O Birth control.   Last pap smear date 6-7 years ago.   Last mammogram date 07/2021 - normal.   Denies H/O Abnormal pap smear.   Denies H/O STD.   GYN procedures Endometrial Biopsy- 02/04/2022.       OB History  Number of pregnancies 1.   Pregnancy # 1 live birth, girl, vaginal delivery.       Allergies   Penicillin G Sodium: yeast - Side Effects   Dust Mite Mixed Allergen Ext: sinus swelling - Allergy       Hospitalization/Major Diagnostic Procedure   Denies Past Hospitalization       Review of Systems  CONSTITUTIONAL:         Chills No. Fatigue No. Fever No. Night sweats No. Recent travel outside [place] Sweats No. Weight change No.     OPHTHALMOLOGY:         Blurring of vision no. Change in vision no. Double vision no.     ENT:         Dizziness no. Nose bleeds no. Sore throat no. Teeth pain no.     ALLERGY:         Hives no.     CARDIOLOGY:         Chest pain no. High blood pressure no. Irregular heart beat no. Leg edema no. Palpitations no.     RESPIRATORY:         Shortness of  breath no. Cough no. Wheezing no.     UROLOGY:         Pain with urination no. Urinary urgency no. Urinary frequency no. Urinary incontinence no. Difficulty urinating No. Blood in urine No.     GASTROENTEROLOGY:         Abdominal pain no. Appetite change no. Bloating/belching no. Blood in stool or on toilet paper no. Change in bowel movements no. Constipation no. Diarrhea no. Difficulty swallowing no. Nausea no.      FEMALE REPRODUCTIVE:         Vulvar pain no. Vulvar rash no. Abnormal vaginal bleeding yes. Breast pain no. Nipple discharge no. Pain with intercourse no. Pelvic pain no. Unusual vaginal discharge no. Vaginal itching no.     MUSCULOSKELETAL:         Muscle aches no.     NEUROLOGY:         Headache no. Tingling/numbness no. Weakness no.     PSYCHOLOGY:         Depression no. Anxiety no. Nervousness no. Sleep disturbances no. Suicidal ideation no .     ENDOCRINOLOGY:         Excessive thirst no. Excessive urination no. Hair loss no. Heat or cold intolerance no.     HEMATOLOGY/LYMPH:         Abnormal bleeding no. Easy bruising no. Swollen glands no.     DERMATOLOGY:         New/changing skin lesion no. Rash no. Sores no.     Negative except as stated in HPI Negative except as stated in HPI Negative except as stated in HPI.      Vital Signs  Wt 226.0, Wt change 7 lbs, Ht 63, BMI 40.03, Pulse sitting 60, BP sitting 146/84 Initial BP: 168/75.     Examination  General Examination:       CONSTITUTIONAL: alert, oriented, NAD . SKIN:  moist, warm. EYES:  Conjunctiva clear. LUNGS:  good I:E efffort noted.. , clear to auscultation bilaterally. HEART:  regular rate and rhythm. ABDOMEN: soft, non-tender/non-distended, bowel sounds present . FEMALE GENITOURINARY: normal external genitalia, labia - unremarkable, vagina - pink moist mucosa, no lesions or abnormal discharge, cervix - no discharge or lesions or CMT, adnexa - no masses or tenderness, uterus - nontender and normal size on palpation . EXTREMITIES:  no edema present. PSYCH:  affect normal, good eye contact.       Physical Examination  Chaperone present:         Chaperone present  Beather Arbour 03/30/2022 09:52:55 AM > , for pelvic exam.            Assessments     1. Postmenopausal bleeding - N95.0 (Primary)   2. Endometrial thickening on ultrasound - R93.89   3. Endometrial polyp - N84.0     Treatment   1. Postmenopausal  bleeding   Notes: Planning hysteroscopy D/C polypectomy with myosure. Pt is advised she will be able to return home the same day if she is doing well. Discussed risks of hysteroscopy including but not limited to infection, bleeding, possible perforation of the uterus, with the need for further surgery. Pt advised to avoid NSAIDs (Aspirin, Aleve, Advil, Ibuprofen, Motrin) from now until surgery given risk of bleeding during surgery. She may take Tylenol for pain management. She is advised to avoid eating or drinking starting midnight prior to surgery. Discussed post-surgery avoidance of driving for 24 hours or intercourse for 2 weeks after procedure.[name]  2. Endometrial thickening on ultrasound   Notes: Planning hysteroscopy D/C polypectomy with myosure. Pt is advised she will be able to return home the same day if she is doing well. Discussed risks of hysteroscopy including but not limited to infection, bleeding, possible perforation of the uterus, with the need for further surgery. Pt advised to avoid NSAIDs (Aspirin, Aleve, Advil, Ibuprofen, Motrin) from now until surgery given risk of bleeding during surgery. She may take Tylenol for pain management. She is advised to avoid eating or drinking starting midnight prior to surgery. Discussed post-surgery avoidance of driving for 24 hours or intercourse for 2 weeks after procedure..      3. Endometrial polyp   Notes: Planning hysteroscopy D/C polypectomy with myosure. Pt is advised she will be able to return home the same day if she is doing well. Discussed risks of hysteroscopy including but not limited to infection, bleeding, possible perforation of the uterus, with the need for further surgery. Pt advised to avoid NSAIDs (Aspirin, Aleve, Advil, Ibuprofen, Motrin) from now until surgery given risk of bleeding during surgery. She may take Tylenol for pain management. She is advised to avoid eating or drinking starting midnight prior to surgery. Discussed  post-surgery avoidance of driving for 24 hours or intercourse for 2 weeks after procedure.[name]

## 2022-04-15 NOTE — Anesthesia Postprocedure Evaluation (Signed)
Anesthesia Post Note  Patient: Kristina [name]  Procedure(s) Performed: DILATATION & CURETTAGE/HYSTEROSCOPY WITH MYOSURE (Uterus)     Patient location during evaluation: PACU Anesthesia Type: General Level of consciousness: awake and alert, oriented and patient cooperative Pain management: pain level controlled Vital Signs Assessment: post-procedure vital signs reviewed and stable Respiratory status: spontaneous breathing, nonlabored ventilation and respiratory function stable Cardiovascular status: blood pressure returned to baseline and stable Postop Assessment: no apparent nausea or vomiting Anesthetic complications: no   No notable events documented.  Last Vitals:  Vitals:   04/15/22 1515 04/15/22 1530  BP: (!) 160/77 119/66  Pulse: 65 (!) 59  Resp: 16 20  Temp: (!) 36.1 C   SpO2: 99% 99%    Last Pain:  Vitals:   04/15/22 1515  TempSrc:   PainSc: 0-No pain                 Pervis Hocking

## 2022-04-15 NOTE — H&P (Signed)
Date of Initial H&P: 04/15/2022  History reviewed, patient examined, no change in status, stable for surgery.

## 2022-04-15 NOTE — Op Note (Signed)
04/15/2022  3:07 PM  PATIENT:  Kristina [name]  76 y.o. female  PRE-OPERATIVE DIAGNOSIS:  Endometrial Polyp  N84.0 PMB N95.0  POST-OPERATIVE DIAGNOSIS:  Endometrial Polyp  N84.0 PMB N95.0  PROCEDURE:  Procedure(s): DILATATION & CURETTAGE/HYSTEROSCOPY WITH MYOSURE (N/A)  SURGEON:  Surgeon(s) and Role:    Christophe Louis, MD - Primary  PHYSICIAN ASSISTANT:   ASSISTANTS: none   ANESTHESIA:   general  EBL:  5 mL   BLOOD ADMINISTERED:none  DRAINS: none   LOCAL MEDICATIONS USED:  MARCAINE     SPECIMEN:  Source of Specimen:  Endometrial polyp and curettings   DISPOSITION OF SPECIMEN:  PATHOLOGY  COUNTS:  YES  TOURNIQUET:  * No tourniquets in log *  DICTATION: .Note written in EPIC  PLAN OF CARE: Discharge to home after PACU  PATIENT DISPOSITION:  PACU - hemodynamically stable.   Delay start of Pharmacological VTE agent (>24hrs) due to surgical blood loss or risk of bleeding: not applicable  Findings: normal external genitalia. Atrophic vaginal mucosa  and atrophic cervix. Small posterior endometrial polyp. Atrophic appearing endometrium   Procedure: Patient was taken to operating room #4 [facility] where she was placed under general anesthesia. She was placed in the dorsal lithotomy position. She was prepped and draped in the usual sterile fashion. A speculum was placed into the vaginal vault. The anterior lip of the cervix was grasped with a single-tooth tenaculum. Quarter percent Marcaine was injected at the 4 and 8:00 positions of the cervix. The cervix was then sounded to 5 cm. The cervix was dilated to approximately 6 mm. Mysosure operative  hysteroscope was inserted. The findings noted above. Myosure Lite  blade was introduced throught the hysteroscope. The endometrial mass was removed in less than 1  minute.  There was no evidence of perforation.Endometrial currettings were obtained with the lite myosure blade. Hysteroscope was then removed. The  single-tooth tenaculum was removed from the anterior lip of the cervix. Excellent hemostasis was noted. The speculum was removed from the patient's vagina. She was awakened from anesthesia taken care  To the recovery  room awake and in stable condition. Sponge lap and needle counts were correct x2.

## 2022-04-15 NOTE — Anesthesia Preprocedure Evaluation (Signed)
Anesthesia Evaluation  Patient identified by MRN, date of birth, ID band Patient awake    Reviewed: Allergy & Precautions, NPO status , Patient's Chart, lab work & pertinent test results  Airway Mallampati: II  TM Distance: >3 FB     Dental  (+) Dental Advisory Given, Upper Dentures, Partial Lower   Pulmonary former smoker,    breath sounds clear to auscultation       Cardiovascular hypertension, Pt. on medications  Rhythm:Regular Rate:Normal  Normal EF. Mild AS   Neuro/Psych Anxiety negative neurological ROS     GI/Hepatic negative GI ROS, Neg liver ROS,   Endo/Other  Morbid obesity  Renal/GU negative Renal ROS     Musculoskeletal  (+) Arthritis , Osteoarthritis,    Abdominal (+) + obese,   Peds  Hematology  (+) Blood dyscrasia, anemia ,   Anesthesia Other Findings   Reproductive/Obstetrics                             Lab Results  Component Value Date   WBC 7.2 04/15/2022   HGB 12.0 04/15/2022   HCT 39.0 04/15/2022   MCV 66.1 (L) 04/15/2022   PLT 254 04/15/2022   Lab Results  Component Value Date   CREATININE 0.56 04/15/2022   BUN 22 04/15/2022   NA 140 04/15/2022   K 4.2 04/15/2022   CL 102 04/15/2022   CO2 26 04/15/2022    Anesthesia Physical  Anesthesia Plan  ASA: III  Anesthesia Plan: General   Post-op Pain Management: Celebrex PO (pre-op)*, Dilaudid IV, Tylenol PO (pre-op)* and Toradol IV (intra-op)*   Induction: Intravenous  PONV Risk Score and Plan: 3 and Dexamethasone, Ondansetron, Treatment may vary due to age or medical condition and Midazolam  Airway Management Planned: LMA  Additional Equipment:   Intra-op Plan:   Post-operative Plan: Extubation in OR  Informed Consent: I have reviewed the patients History and Physical, chart, labs and discussed the procedure including the risks, benefits and alternatives for the proposed anesthesia with the  patient or authorized representative who has indicated his/her understanding and acceptance.     Dental advisory given  Plan Discussed with: CRNA  Anesthesia Plan Comments: ( )        Anesthesia Quick Evaluation

## 2022-04-15 NOTE — Anesthesia Procedure Notes (Signed)
Procedure Name: LMA Insertion Date/Time: 04/15/2022 2:11 PM  Performed by: Michele Rockers, CRNAPre-anesthesia Checklist: Patient identified, Patient being monitored, Timeout performed, Emergency Drugs available and Suction available Patient Re-evaluated:Patient Re-evaluated prior to induction Oxygen Delivery Method: Circle System Utilized Preoxygenation: Pre-oxygenation with 100% oxygen Induction Type: IV induction Ventilation: Mask ventilation without difficulty LMA: LMA inserted LMA Size: 4.0 Number of attempts: 1 Placement Confirmation: positive ETCO2 and breath sounds checked- equal and bilateral Tube secured with: Tape Dental Injury: Teeth and Oropharynx as per pre-operative assessment

## 2022-04-15 NOTE — H&P (Deleted)
  The note originally documented on this encounter has been moved the the encounter in which it belongs.  

## 2022-04-16 ENCOUNTER — Encounter (HOSPITAL_COMMUNITY): Payer: Self-pay | Admitting: Obstetrics and Gynecology

## 2022-04-17 LAB — SURGICAL PATHOLOGY

## 2022-04-21 DIAGNOSIS — H3582 Retinal ischemia: Secondary | ICD-10-CM | POA: Diagnosis not present

## 2022-04-21 DIAGNOSIS — H35033 Hypertensive retinopathy, bilateral: Secondary | ICD-10-CM | POA: Diagnosis not present

## 2022-04-21 DIAGNOSIS — H3581 Retinal edema: Secondary | ICD-10-CM | POA: Diagnosis not present

## 2022-04-21 DIAGNOSIS — H2513 Age-related nuclear cataract, bilateral: Secondary | ICD-10-CM | POA: Diagnosis not present

## 2022-04-21 DIAGNOSIS — H02831 Dermatochalasis of right upper eyelid: Secondary | ICD-10-CM | POA: Diagnosis not present

## 2022-04-21 DIAGNOSIS — H40053 Ocular hypertension, bilateral: Secondary | ICD-10-CM | POA: Diagnosis not present

## 2022-04-21 DIAGNOSIS — H02834 Dermatochalasis of left upper eyelid: Secondary | ICD-10-CM | POA: Diagnosis not present

## 2022-05-13 ENCOUNTER — Encounter (INDEPENDENT_AMBULATORY_CARE_PROVIDER_SITE_OTHER): Payer: Self-pay

## 2022-06-15 DIAGNOSIS — E7800 Pure hypercholesterolemia, unspecified: Secondary | ICD-10-CM | POA: Diagnosis not present

## 2022-06-15 DIAGNOSIS — I739 Peripheral vascular disease, unspecified: Secondary | ICD-10-CM | POA: Diagnosis not present

## 2022-06-15 DIAGNOSIS — Z86010 Personal history of colonic polyps: Secondary | ICD-10-CM | POA: Diagnosis not present

## 2022-06-15 DIAGNOSIS — Z8673 Personal history of transient ischemic attack (TIA), and cerebral infarction without residual deficits: Secondary | ICD-10-CM | POA: Diagnosis not present

## 2022-06-15 DIAGNOSIS — E559 Vitamin D deficiency, unspecified: Secondary | ICD-10-CM | POA: Diagnosis not present

## 2022-06-15 DIAGNOSIS — Z0000 Encounter for general adult medical examination without abnormal findings: Secondary | ICD-10-CM | POA: Diagnosis not present

## 2022-06-15 DIAGNOSIS — Z23 Encounter for immunization: Secondary | ICD-10-CM | POA: Diagnosis not present

## 2022-06-15 DIAGNOSIS — I10 Essential (primary) hypertension: Secondary | ICD-10-CM | POA: Diagnosis not present

## 2022-06-15 DIAGNOSIS — M1711 Unilateral primary osteoarthritis, right knee: Secondary | ICD-10-CM | POA: Diagnosis not present

## 2022-06-17 DIAGNOSIS — Z23 Encounter for immunization: Secondary | ICD-10-CM | POA: Diagnosis not present

## 2022-06-25 DIAGNOSIS — U071 COVID-19: Secondary | ICD-10-CM | POA: Diagnosis not present

## 2022-07-06 ENCOUNTER — Other Ambulatory Visit: Payer: Self-pay | Admitting: Family Medicine

## 2022-07-06 DIAGNOSIS — Z1231 Encounter for screening mammogram for malignant neoplasm of breast: Secondary | ICD-10-CM

## 2022-08-06 ENCOUNTER — Ambulatory Visit
Admission: RE | Admit: 2022-08-06 | Discharge: 2022-08-06 | Disposition: A | Discharge status: Home / Self Care | Payer: Medicare Other | Source: Ambulatory Visit | Attending: Family Medicine | Admitting: Family Medicine

## 2022-08-06 DIAGNOSIS — Z1231 Encounter for screening mammogram for malignant neoplasm of breast: Secondary | ICD-10-CM | POA: Diagnosis not present

## 2022-09-22 ENCOUNTER — Ambulatory Visit: Payer: Medicare Other | Attending: Cardiovascular Disease | Admitting: Cardiovascular Disease

## 2022-09-22 ENCOUNTER — Encounter: Payer: Self-pay | Admitting: Cardiovascular Disease

## 2022-09-22 VITALS — BP 138/84 | HR 55 | Ht 63.0 in | Wt 235.2 lb

## 2022-09-22 DIAGNOSIS — I6523 Occlusion and stenosis of bilateral carotid arteries: Secondary | ICD-10-CM | POA: Diagnosis not present

## 2022-09-22 DIAGNOSIS — I10 Essential (primary) hypertension: Secondary | ICD-10-CM | POA: Diagnosis not present

## 2022-09-22 DIAGNOSIS — I350 Nonrheumatic aortic (valve) stenosis: Secondary | ICD-10-CM

## 2022-09-22 DIAGNOSIS — E782 Mixed hyperlipidemia: Secondary | ICD-10-CM

## 2022-09-22 NOTE — Assessment & Plan Note (Signed)
History of mild aortic stenosis by 2D echo 09/08/2021 with a valve area of 1.73 cm and a peak gradient of 22.  She does have an outflow tract murmur.  Will repeat a 2D echocardiogram.

## 2022-09-22 NOTE — Assessment & Plan Note (Signed)
History of hyperlipidemia on statin therapy with lipid profile performed 06/15/2022 with enteroplasty 179, LDL of 83 and HDL 69.

## 2022-09-22 NOTE — Patient Instructions (Signed)
Medication Instructions:  Your physician recommends that you continue on your current medications as directed. Please refer to the Current Medication list given to you today.  *If you need a refill on your cardiac medications before your next appointment, please call your pharmacy*   Testing/Procedures: Your physician has requested that you have an echocardiogram. Echocardiography is a painless test that uses sound waves to create images of your heart. It provides your doctor with information about the size and shape of your heart and how well your heart's chambers and valves are working. This procedure takes approximately one hour. There are no restrictions for this procedure. Please do NOT wear cologne, perfume, aftershave, or lotions (deodorant is allowed). Please arrive 15 minutes prior to your appointment time. This procedure will be done at [address]    Follow-Up: At [facility], you and your health needs are our priority.  As part of our continuing mission to provide you with exceptional heart care, we have created designated Provider Care Teams.  These Care Teams include your primary Cardiologist (physician) and Advanced Practice Providers (APPs -  Physician Assistants and Nurse Practitioners) who all work together to provide you with the care you need, when you need it.  We recommend signing up for the patient portal called "MyChart".  Sign up information is provided on this After Visit Summary.  MyChart is used to connect with patients for Virtual Visits (Telemedicine).  Patients are able to view lab/test results, encounter notes, upcoming appointments, etc.  Non-urgent messages can be sent to your provider as well.   To learn more about what you can do with MyChart, go to [email or web address].    Your next appointment:   12 month(s)  The format for your next appointment:   In Person  Provider:   Quay Burow, MD

## 2022-09-22 NOTE — Assessment & Plan Note (Signed)
History of essential hypertension with blood pressure measured today at 138/84.  She is on hydrochlorothiazide, and losartan.

## 2022-09-22 NOTE — Progress Notes (Signed)
09/22/2022 Kristina [name]   Feb 23, 1946  [id number]  Primary Physician Kristina Lass, MD Primary Cardiologist: Kristina Harp MD Kristina [name], [place]   HPI:  Kristina [name] is a 76 y.o.  moderately overweight single Caucasian female mother of 1 child, grandmother of 1 grandchild referred to me by Dr. Kathyrn [name] for symptomatic carotid artery disease.  She is a retired Engineer, materials in [employer]  She moved from [place] to [place] 2 years ago to be closer to family.  She worked at [employer] for 50 years.  I last saw her in the office 02/11/2021. Risk factors include remote tobacco abuse having quit 20 years ago, treated hypertension hyperlipidemia.  There is no family history for heart disease.  She is never had a heart attack or stroke.  She denies chest pain or shortness of breath.  She did lose 166 pounds 12 years ago and has been struggling with weight but is on a diet.  She began having amaurosis within the last several months and had the worst episode on Thanksgiving.  Her last episode was 2 days ago.  She has seen a retinal specialist who did not find any retinal abnormalities apparently.  She had a 2D echocardiogram performed today that showed normal LV systolic function without valvular abnormalities with grade 1 diastolic dysfunction and mild aortic stenosis.  Carotid Dopplers revealed moderate right and high-grade left ICA stenosis.   Since I saw her a year and a half ago she continues to do well.  She denies chest pain or shortness of breath.  She continues to follow with Dr. Trula [name] for her carotid disease which by CTA was discordant with the Doppler results but did not show critical disease.  Her weight has remained stable.   Current Meds  Medication Sig   aspirin EC 81 MG tablet Take 81 mg by mouth daily.   atorvastatin (LIPITOR) 20 MG tablet Take 20 mg by mouth every morning.   dorzolamide-timolol (COSOPT) 22.3-6.8 MG/ML ophthalmic solution Place 1  drop into both eyes 2 (two) times daily.   ELDERBERRY PO Take 2 capsules by mouth daily. Airborne   escitalopram (LEXAPRO) 10 MG tablet Take 10 mg by mouth daily.   GLUCOSAMINE-CHONDROITIN PO Take 1 tablet by mouth 2 (two) times daily.    hydrochlorothiazide (HYDRODIURIL) 25 MG tablet Take 25 mg by mouth daily.    ibuprofen (ADVIL) 600 MG tablet Take 1 tablet (600 mg total) by mouth every 6 (six) hours as needed.   losartan (COZAAR) 100 MG tablet Take 100 mg by mouth daily.   Multiple Vitamin (MULTIVITAMIN) capsule Take 1 capsule by mouth daily. Vita fusion   Multiple Vitamins-Minerals (AIRBORNE GUMMIES PO) Take 3 tablets by mouth daily. immune system   oxyCODONE (OXY IR/ROXICODONE) 5 MG immediate release tablet Take 1 tablet (5 mg total) by mouth every 6 (six) hours as needed for severe pain.   pravastatin (PRAVACHOL) 40 MG tablet Take 1 tablet (40 mg total) by mouth every evening. (Patient taking differently: Take 40 mg by mouth daily.)   Vitamin D, Ergocalciferol, (DRISDOL) 1.25 MG (50000 UNIT) CAPS capsule Take 1 capsule (50,000 Units total) by mouth every 7 (seven) days.     Allergies  Allergen Reactions   Penicillins Anaphylaxis and Itching    Did it involve swelling of the face/tongue/throat, SOB, or low BP? Yes Did it involve sudden or severe rash/hives, skin peeling, or any reaction on the inside of your mouth or nose? No  Did you need to seek medical attention at a hospital or doctor's office? Yes When did it last happen?      Childhood allergy If all above answers are "NO", may proceed with cephalosporin use.    Mite (D. 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  Tobacco Use   Smoking  status: Former    Packs/day: 1.50    Years: 30.00    Total pack years: 45.00    Types: Cigarettes    Quit date: 2009    Years since quitting: 14.9   Smokeless tobacco: Never  Vaping Use   Vaping Use: Never used  Substance and Sexual Activity   Alcohol use: Yes    Comment: social   Drug use: Never   Sexual activity: Not on file  Other Topics Concern   Not on file  Social History Narrative   Not on file   Social Determinants of Health   Financial Resource Strain: Not on file  Food Insecurity: Not on file  Transportation Needs: Not on file  Physical Activity: Not on file  Stress: Not on file  Social Connections: Not on file  Intimate Partner Violence: Not on file     Review of Systems: General: negative for chills, fever, night sweats or weight changes.  Cardiovascular: negative for chest pain, dyspnea on exertion, edema, orthopnea, palpitations, paroxysmal nocturnal dyspnea or shortness of breath Dermatological: negative for rash Respiratory: negative for cough or wheezing Urologic: negative for hematuria Abdominal: negative for nausea, vomiting, diarrhea, bright red blood per rectum, melena, or hematemesis Neurologic: negative for visual changes, syncope, or dizziness All other systems reviewed and are otherwise negative except as noted above.    Blood pressure 138/84, pulse (!) 55, height '5\' 3"'$  (1.6 m), weight 235 lb 3.2 oz (106.7 kg).  General appearance: alert and no distress Neck: no adenopathy, no JVD, supple, symmetrical, trachea midline, thyroid not enlarged, symmetric, no tenderness/mass/nodules, and left carotid Kristina [name] Lungs: clear to auscultation bilaterally Heart: 2/6 outflow tract murmur consistent with aortic stenosis. Extremities: extremities normal, atraumatic, no cyanosis or edema Pulses: 2+ and symmetric Skin: Skin color, texture, turgor normal. No rashes or lesions Neurologic: Grossly normal  EKG sinus bradycardia 55 without ST or T wave changes.   Personally reviewed this EKG.  ASSESSMENT AND PLAN:   Hyperlipidemia History of hyperlipidemia on statin therapy with lipid profile performed 06/15/2022 with enteroplasty 179, LDL of 83 and HDL 69.  Essential hypertension History of essential hypertension with blood pressure measured today at 138/84.  She is on hydrochlorothiazide, and losartan.  Carotid artery disease (HCC) History of carotid artery disease followed by Dr. Trula [name].  Aortic stenosis History of mild aortic stenosis by 2D echo 09/08/2021 with a valve area of 1.73 cm and a peak gradient of 22.  She does have an outflow tract murmur.  Will repeat a 2D echocardiogram.     Kristina Harp MD [facility], [facility] 09/22/2022 10:08 AM

## 2022-09-22 NOTE — Assessment & Plan Note (Signed)
History of carotid artery disease followed by Dr. Trula Slade.

## 2022-10-12 DIAGNOSIS — E7800 Pure hypercholesterolemia, unspecified: Secondary | ICD-10-CM | POA: Diagnosis not present

## 2022-10-20 DIAGNOSIS — H2513 Age-related nuclear cataract, bilateral: Secondary | ICD-10-CM | POA: Diagnosis not present

## 2022-10-20 DIAGNOSIS — H02834 Dermatochalasis of left upper eyelid: Secondary | ICD-10-CM | POA: Diagnosis not present

## 2022-10-20 DIAGNOSIS — H3582 Retinal ischemia: Secondary | ICD-10-CM | POA: Diagnosis not present

## 2022-10-20 DIAGNOSIS — H3581 Retinal edema: Secondary | ICD-10-CM | POA: Diagnosis not present

## 2022-10-20 DIAGNOSIS — H40053 Ocular hypertension, bilateral: Secondary | ICD-10-CM | POA: Diagnosis not present

## 2022-10-20 DIAGNOSIS — H02831 Dermatochalasis of right upper eyelid: Secondary | ICD-10-CM | POA: Diagnosis not present

## 2022-10-20 DIAGNOSIS — H35033 Hypertensive retinopathy, bilateral: Secondary | ICD-10-CM | POA: Diagnosis not present

## 2022-10-23 ENCOUNTER — Ambulatory Visit (HOSPITAL_COMMUNITY): Payer: Medicare Other | Attending: Cardiovascular Disease

## 2022-10-23 DIAGNOSIS — I10 Essential (primary) hypertension: Secondary | ICD-10-CM | POA: Insufficient documentation

## 2022-10-23 DIAGNOSIS — I350 Nonrheumatic aortic (valve) stenosis: Secondary | ICD-10-CM | POA: Insufficient documentation

## 2022-10-23 DIAGNOSIS — E782 Mixed hyperlipidemia: Secondary | ICD-10-CM | POA: Insufficient documentation

## 2022-10-23 DIAGNOSIS — I6523 Occlusion and stenosis of bilateral carotid arteries: Secondary | ICD-10-CM | POA: Insufficient documentation

## 2022-10-23 LAB — ECHOCARDIOGRAM COMPLETE
AR max vel: 1.24 cm2
AV Area VTI: 1.22 cm2
AV Area mean vel: 1.19 cm2
AV Mean grad: 19 mmHg
AV Peak grad: 35.1 mmHg
Ao pk vel: 2.96 m/s
Area-P 1/2: 2.94 cm2
S' Lateral: 3.5 cm

## 2022-10-23 NOTE — Progress Notes (Signed)
HISTORY AND PHYSICAL     CC:  follow up. Requesting Provider:  Kathyrn Lass, MD  HPI: This is a 77 y.o. female here for follow up for carotid artery stenosis.  Pt is s/p left CEA for symptomatic carotid artery stenosis on 09/27/2019 by Dr. Trula Slade.  She was having left eye amaurosis at the time.     Pt was last seen October 2022 and at that time her duplex revealed 80-99% right ICA stenosis.  Dr. Trula Slade sent her for CTA of the neck and the stenosis was estimated to be around 35% and was not hemodynamically significant.  He felt it was probably underestimated but definitely not greater than 80%.  Given she was asymptomatic, he recommended continued follow up in one year with u/s.   Pt returns today for follow up.    Pt denies any amaurosis fugax, speech difficulties, weakness, numbness, paralysis or clumsiness or facial droop.     The pt is on a statin for cholesterol management.  The pt is on a daily aspirin.   Other AC:  none The pt is on ARB, diuretic for hypertension.   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[facility])     Past Surgical History:  Procedure Laterality Date   [place] N/A 04/15/2022   Procedure: DILATATION & CURETTAGE/HYSTEROSCOPY WITH MYOSURE;  Surgeon: Christophe Louis, MD;  Location: [place];  Service: Gynecology;  Laterality: N/A;   ENDARTERECTOMY Left 09/27/2019   Procedure: ENDARTERECTOMY CAROTID LEFT;  Surgeon: Serafina Mitchell, MD;  Location: [facility] OR;  Service: Vascular;  Laterality: Left;   HERNIA REPAIR  2007   HERNIA REPAIR   2015   PATCH ANGIOPLASTY Left 09/27/2019   Procedure: Patch Angioplasty Using Rueben Bash;  Surgeon: Serafina Mitchell, MD;  Location[facility] OR;  Service: Vascular;  Laterality: Left;   REPLACEMENT TOTAL KNEE  2016   TONSILLECTOMY      Allergies  Allergen Reactions   Penicillins Anaphylaxis and Itching    Did it involve swelling of the face/tongue/throat, SOB, or low BP? Yes Did it involve sudden or severe rash/hives, skin peeling, or any reaction on the inside of your mouth or nose? No Did you need to seek medical attention at a hospital or doctor's office? Yes When did it last happen?      Childhood allergy If all above answers are "NO", may proceed with cephalosporin use.    Mite (D. Farinae)     Other reaction(s): sinus swelling    Current Outpatient Medications  Medication Sig Dispense Refill   acetaminophen (TYLENOL) 325 MG tablet Take 1-2 tablets (325-650 mg total) by mouth every 6 (six) hours as needed for mild pain (or temp >/= 101 F).     aspirin EC 81 MG tablet Take 81 mg by mouth daily.     atorvastatin (LIPITOR) 20 MG tablet Take 20 mg by mouth every morning.     dorzolamide-timolol (COSOPT) 22.3-6.8  MG/ML ophthalmic solution Place 1 drop into both eyes 2 (two) times daily. 10 mL 4   ELDERBERRY PO Take 2 capsules by mouth daily. Airborne     escitalopram (LEXAPRO) 10 MG tablet Take 10 mg by mouth daily.     GLUCOSAMINE-CHONDROITIN PO Take 1 tablet by mouth 2 (two) times daily.      hydrochlorothiazide (HYDRODIURIL) 25 MG tablet Take 25 mg by mouth daily.      ibuprofen (ADVIL) 600 MG tablet Take 1 tablet (600 mg total) by mouth every 6 (six) hours as needed. 30 tablet 0   losartan (COZAAR) 100 MG tablet Take 100 mg by mouth daily.     montelukast (SINGULAIR) 10 MG tablet TAKE 1 TABLET(10 MG) BY MOUTH AT BEDTIME 30 tablet 0   Multiple Vitamin (MULTIVITAMIN) capsule Take 1 capsule by mouth daily. Vita fusion     Multiple Vitamins-Minerals (AIRBORNE GUMMIES PO) Take 3 tablets by  mouth daily. immune system     oxyCODONE (OXY IR/ROXICODONE) 5 MG immediate release tablet Take 1 tablet (5 mg total) by mouth every 6 (six) hours as needed for severe pain. 6 tablet 0   pravastatin (PRAVACHOL) 40 MG tablet Take 1 tablet (40 mg total) by mouth every evening. (Patient taking differently: Take 40 mg by mouth daily.) 90 tablet 3   Vitamin D, Ergocalciferol, (DRISDOL) 1.25 MG (50000 UNIT) CAPS capsule Take 1 capsule (50,000 Units total) by mouth every 7 (seven) days. 4 capsule 0   No current facility-administered medications for this visit.    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  Tobacco Use   Smoking status: Former    Packs/day: 1.50    Years: 30.00    Total pack years: 45.00    Types: Cigarettes    Quit date: 2009    Years since quitting: 15.0   Smokeless tobacco: Never  Vaping Use   Vaping Use: Never used  Substance and Sexual Activity   Alcohol use: Yes    Comment: social   Drug use: Never   Sexual activity: Not on file  Other Topics Concern   Not on file  Social History Narrative   Not on file   Social Determinants of Health   Financial Resource Strain: Not on file  Food Insecurity: Not on file  Transportation Needs: Not on file  Physical Activity: Not on file  Stress: Not on file  Social Connections: Not on file  Intimate Partner  Violence: Not on file     REVIEW OF SYSTEMS:   '[X]'$  denotes positive finding, '[ ]'$  denotes negative finding Cardiac  Comments:  Chest pain or chest pressure:  Shortness of breath upon exertion:    Short of breath when lying flat:    Irregular heart rhythm:        Vascular    Pain in calf, thigh, or hip brought on by ambulation:    Pain in feet at night that wakes you up from your sleep:     Blood clot in your veins:    Leg swelling:         Pulmonary    Oxygen at home:    Productive cough:     Wheezing:         Neurologic    Sudden weakness in arms or legs:     Sudden numbness in arms or legs:     Sudden onset of difficulty speaking or slurred speech:    Temporary loss of vision in one eye:     Problems with dizziness:         Gastrointestinal    Blood in stool:     Vomited blood:         Genitourinary    Burning when urinating:     Blood in urine:        Psychiatric    Major depression:         Hematologic    Bleeding problems:    Problems with blood clotting too easily:        Skin    Rashes or ulcers:        Constitutional    Fever or chills:      PHYSICAL EXAMINATION:  Today's Vitals   10/26/22 0829 10/26/22 0830  BP: (!) 157/68 (!) 165/70  Pulse: (!) 59   Temp: 98 F (36.7 C)   TempSrc: Temporal   SpO2: 96%   Weight: 235 lb (106.6 kg)   Height: '5\' 3"'$  (1.6 m)    Body mass index is 41.63 kg/m.   General:  WDWN in NAD; vital signs documented above Gait: Normal HENT: WNL, normocephalic Pulmonary: normal non-labored breathing Cardiac: regular HR, with carotid bruit on the right Skin: without rashes Vascular Exam/Pulses: 2+ bilateral radial pulses Extremities: without open wounds Musculoskeletal: no muscle wasting or atrophy  Neurologic: A&O X 3; moving all extremities equally; speech is fluent/normal Psychiatric:  The pt has Normal affect.   Non-Invasive Vascular Imaging:   Carotid Duplex on 10/26/2022 Right:  80-99% ICA  stenosis Left:  1-39% ICA stenosis   Previous Carotid duplex on 07/07/2021: Right: 80-99% ICA stenosis Left:   1-39% ICA stenosis  CTA neck 07/08/2021: IMPRESSION: 1. Approximately 35% narrowing of the right proximal internal carotid artery, just distal to the bifurcation, secondary to calcified plaque. This level of stenosis is not considered hemodynamically significant. 2. No other significant stenosis in the neck. 3. Enlargement of main pulmonary artery, which can be seen in setting of pulmonary hypertension   ASSESSMENT/PLAN:: 77 y.o. female here for follow up carotid artery stenosis and left CEA for symptomatic carotid artery stenosis on 09/27/2019 by Dr. Trula Slade.  She was having left eye amaurosis at the time.     -duplex today reveals 80-99% right ICA stenosis.  She is asymptomatic.  Velocities are decreased from last duplex.  At last visit with Dr. Trula Slade, he had gotten a CTA head/neck and this revealed 35% ICA stenosis.  Dr. Trula Slade felt this was underestimated but certainly not 28%.  Discussed with him and we will have her return in 6 months with CTA head/neck and see Dr. Trula Slade.  -discussed s/s of stroke with  pt and she understands should she develop any of these sx, she will go to the nearest ER or call 911. -pt will call sooner should she have any issues. -continue statin/asa   Leontine Locket, [facility] [phone]  Clinic MD:  Trula Slade

## 2022-10-26 ENCOUNTER — Other Ambulatory Visit: Payer: Self-pay | Admitting: *Deleted

## 2022-10-26 ENCOUNTER — Ambulatory Visit (HOSPITAL_COMMUNITY)
Admission: RE | Admit: 2022-10-26 | Discharge: 2022-10-26 | Disposition: A | Discharge status: Home / Self Care | Payer: Medicare Other | Source: Ambulatory Visit | Attending: Surgery | Admitting: Surgery

## 2022-10-26 ENCOUNTER — Ambulatory Visit (INDEPENDENT_AMBULATORY_CARE_PROVIDER_SITE_OTHER): Payer: Medicare Other | Admitting: Physician Assistant

## 2022-10-26 VITALS — BP 165/70 | HR 59 | Temp 98.0°F | Ht 63.0 in | Wt 235.0 lb

## 2022-10-26 DIAGNOSIS — I6523 Occlusion and stenosis of bilateral carotid arteries: Secondary | ICD-10-CM | POA: Diagnosis not present

## 2022-12-15 DIAGNOSIS — M1711 Unilateral primary osteoarthritis, right knee: Secondary | ICD-10-CM | POA: Diagnosis not present

## 2022-12-16 DIAGNOSIS — M1711 Unilateral primary osteoarthritis, right knee: Secondary | ICD-10-CM | POA: Diagnosis not present

## 2022-12-24 DIAGNOSIS — L814 Other melanin hyperpigmentation: Secondary | ICD-10-CM | POA: Diagnosis not present

## 2022-12-24 DIAGNOSIS — L821 Other seborrheic keratosis: Secondary | ICD-10-CM | POA: Diagnosis not present

## 2022-12-24 DIAGNOSIS — D1801 Hemangioma of skin and subcutaneous tissue: Secondary | ICD-10-CM | POA: Diagnosis not present

## 2022-12-24 DIAGNOSIS — L578 Other skin changes due to chronic exposure to nonionizing radiation: Secondary | ICD-10-CM | POA: Diagnosis not present

## 2023-01-04 DIAGNOSIS — M25561 Pain in right knee: Secondary | ICD-10-CM | POA: Diagnosis not present

## 2023-01-04 DIAGNOSIS — M1711 Unilateral primary osteoarthritis, right knee: Secondary | ICD-10-CM | POA: Diagnosis not present

## 2023-01-25 DIAGNOSIS — I4891 Unspecified atrial fibrillation: Secondary | ICD-10-CM | POA: Diagnosis not present

## 2023-01-25 DIAGNOSIS — I499 Cardiac arrhythmia, unspecified: Secondary | ICD-10-CM | POA: Diagnosis not present

## 2023-01-25 DIAGNOSIS — D649 Anemia, unspecified: Secondary | ICD-10-CM | POA: Diagnosis not present

## 2023-01-26 ENCOUNTER — Telehealth: Payer: Self-pay

## 2023-01-26 NOTE — Telephone Encounter (Signed)
Spoke with pt regarding an appointment to see APP in the next few weeks per Dr. Allyson Sabal. Dr. Hyacinth Meeker reached out to let us know that pt is in new a-fib. Pt was started to eliquis. Appointment scheduled for pt.

## 2023-01-29 DIAGNOSIS — I4891 Unspecified atrial fibrillation: Secondary | ICD-10-CM | POA: Diagnosis not present

## 2023-01-29 DIAGNOSIS — D508 Other iron deficiency anemias: Secondary | ICD-10-CM | POA: Diagnosis not present

## 2023-02-03 ENCOUNTER — Ambulatory Visit: Payer: Medicare Other | Attending: Nurse Practitioner | Admitting: Nurse Practitioner

## 2023-02-03 ENCOUNTER — Encounter: Payer: Self-pay | Admitting: Nurse Practitioner

## 2023-02-03 VITALS — BP 136/86 | HR 82 | Ht 63.0 in | Wt 233.2 lb

## 2023-02-03 DIAGNOSIS — I10 Essential (primary) hypertension: Secondary | ICD-10-CM

## 2023-02-03 DIAGNOSIS — I480 Paroxysmal atrial fibrillation: Secondary | ICD-10-CM | POA: Diagnosis not present

## 2023-02-03 DIAGNOSIS — G4733 Obstructive sleep apnea (adult) (pediatric): Secondary | ICD-10-CM | POA: Diagnosis not present

## 2023-02-03 DIAGNOSIS — I6523 Occlusion and stenosis of bilateral carotid arteries: Secondary | ICD-10-CM

## 2023-02-03 DIAGNOSIS — I350 Nonrheumatic aortic (valve) stenosis: Secondary | ICD-10-CM

## 2023-02-03 DIAGNOSIS — E785 Hyperlipidemia, unspecified: Secondary | ICD-10-CM

## 2023-02-03 NOTE — Progress Notes (Signed)
Office Visit    Patient Name: Kristina [name] Date of Encounter: 02/03/2023  Primary Care Provider:  Sigmund Hazel, MD Primary Cardiologist:  Nanetta Batty, MD  Chief Complaint    77 year old female with a history of paroxysmal atrial fibrillation, aortic stenosis, carotid artery disease, hypertension, and hyperlipidemia who presents for follow-up related to atrial fibrillation.  Past Medical History    Past Medical History:  Diagnosis Date   Allergic rhinitis    Anemia glaucoma   Anxiety    Back pain    Cataract    Mixed OU   DDD (degenerative disc disease), lumbar    High blood pressure    High cholesterol    History of stomach ulcers    Hypertensive retinopathy    OU   Rheumatic fever    Rheumatic fever    Rheumatoid arteritis [facility]    Past Surgical History:  Procedure Laterality Date   APPENDECTOMY  1955   CHOLECYSTECTOMY  1963   DILATATION & CURETTAGE/HYSTEROSCOPY WITH MYOSURE N/A 04/15/2022   Procedure: DILATATION & CURETTAGE/HYSTEROSCOPY WITH MYOSURE;  Surgeon: Gerald Leitz, MD;  Location: [facility] OR;  Service: Gynecology;  Laterality: N/A;   ENDARTERECTOMY Left 09/27/2019   Procedure: ENDARTERECTOMY CAROTID LEFT;  Surgeon: Nada Libman, MD;  Location: [facility] OR;  Service: Vascular;  Laterality: Left;   HERNIA REPAIR  2007   HERNIA REPAIR  2015   PATCH ANGIOPLASTY Left 09/27/2019   Procedure: Patch Angioplasty Using Livia Snellen;  Surgeon: Nada Libman, MD;  Location: [facility] OR;  Service: Vascular;  Laterality: Left;   REPLACEMENT TOTAL KNEE  2016   TONSILLECTOMY      Allergies  Allergies  Allergen Reactions   Penicillins Anaphylaxis and Itching    Did it involve swelling of the face/tongue/throat, SOB, or low BP? Yes Did it involve sudden or severe rash/hives, skin peeling, or any reaction on the inside of your mouth or nose? No Did you need to seek medical attention at a hospital or doctor's office? Yes When did it last happen?      Childhood allergy If all  above answers are "NO", may proceed with cephalosporin use.    Mite (D. Farinae)     Other reaction(s): sinus swelling     Labs/Other Studies Reviewed    The following studies were reviewed today: Echo 10/2022: IMPRESSIONS     1. Left ventricular ejection fraction, by estimation, is 60 to 65%. The  left ventricle has normal function. The left ventricle has no regional  wall motion abnormalities. There is mild left ventricular hypertrophy.  Left ventricular diastolic parameters  are consistent with Grade I diastolic dysfunction (impaired relaxation).  Elevated left ventricular end-diastolic pressure.   2. Right ventricular systolic function is normal. The right ventricular  size is normal.   3. Left atrial size was moderately dilated.   4. The mitral valve is abnormal. Mild mitral valve regurgitation. No  evidence of mitral stenosis. Moderate mitral annular calcification.   5. Gradients have increased since TTE done 09/08/21 . The aortic valve is  tricuspid. There is moderate calcification of the aortic valve. There is  moderate thickening of the aortic valve. Aortic valve regurgitation is not  visualized. Moderate aortic  valve stenosis.   6. The inferior vena cava is normal in size with greater than 50%  respiratory variability, suggesting right atrial pressure of 3 mmHg.    Recent Labs: 04/15/2022: BUN 22; Creatinine, Ser 0.56; Hemoglobin 12.0; Platelets 254; Potassium 4.2; Sodium 140  Recent Lipid  Panel    Component Value Date/Time   CHOL 169 09/23/2021 0858   TRIG 102 09/23/2021 0858   HDL 66 09/23/2021 0858   CHOLHDL 2.5 12/22/2019 1128   LDLCALC 85 09/23/2021 0858    History of Present Illness    77 year old female with the above past medical history including paroxysmal atrial fibrillation, aortic stenosis, carotid artery disease, hypertension, and hyperlipidemia.  Echocardiogram in 09/2021 showed normal LV systolic function, G1 DD, mild aortic stenosis.  Carotid  Dopplers at the time revealed moderate R ICA and high-grade LICA stenosis.  She was referred to vascular surgery.  She was last seen in the office on 09/22/2022 and was stable from a cardiac standpoint.  Repeat echocardiogram in 10/2022 showed EF 60 to 65%, normal LV function, no RWMA, mild LVH, G1 DD, normal RV systolic function, mild mitral valve regurgitation, moderate aortic stenosis, mean gradient 19 mmHg, increased from prior.  Repeat echocardiogram was recommended in 1 year.  Most recent carotid ultrasound in 10/2022 showed 80 to 99% R ICA stenosis, 1 to 39% LICA stenosis.  She was asymptomatic.  Follow-up CTA of the head and neck was recommended in 6 months.  She saw her PCP in 01/2023 who identified new atrial fibrillation with RVR. She was started on Eliquis and metoprolol and advised to follow-up with cardiology as an outpatient.  Labs including BMET, CBC, and TSH were unremarkable.  She presents today for follow-up.  Since her last visit she has been stable from a cardiac standpoint.  She did note significant shortness of breath, fatigue with new onset atrial fibrillation, this has improved with better heart rate control, though she continues to note some dyspnea on exertion.  She is generally very active but has noticed mildly decreased activity tolerance.  She has questions regarding ongoing treatment of atrial fibrillation.   Home Medications    Current Outpatient Medications  Medication Sig Dispense Refill   apixaban (ELIQUIS) 5 MG TABS tablet Take 5 mg by mouth 2 (two) times daily. Pt. Was given samples from her PCP enough to last one month     aspirin EC 81 MG tablet Take 81 mg by mouth daily.     atorvastatin (LIPITOR) 20 MG tablet Take 20 mg by mouth every morning.     dorzolamide-timolol (COSOPT) 22.3-6.8 MG/ML ophthalmic solution Place 1 drop into both eyes 2 (two) times daily. 10 mL 4   ELDERBERRY PO Take 2 capsules by mouth daily. Airborne     escitalopram (LEXAPRO) 10 MG tablet  Take 10 mg by mouth daily.     GLUCOSAMINE-CHONDROITIN PO Take 1 tablet by mouth 2 (two) times daily.      hydrochlorothiazide (HYDRODIURIL) 25 MG tablet Take 25 mg by mouth daily.      ibuprofen (ADVIL) 600 MG tablet Take 1 tablet (600 mg total) by mouth every 6 (six) hours as needed. 30 tablet 0   losartan (COZAAR) 100 MG tablet Take 100 mg by mouth daily.     metoprolol succinate (TOPROL-XL) 50 MG 24 hr tablet Take 50 mg by mouth daily.     Multiple Vitamin (MULTIVITAMIN) capsule Take 1 capsule by mouth daily. Vita fusion     Multiple Vitamins-Minerals (AIRBORNE GUMMIES PO) Take 3 tablets by mouth daily. immune system     Vitamin D, Ergocalciferol, (DRISDOL) 1.25 MG (50000 UNIT) CAPS capsule Take 1 capsule (50,000 Units total) by mouth every 7 (seven) days. 4 capsule 0   acetaminophen (TYLENOL) 325 MG tablet Take 1-2 tablets (325-650  mg total) by mouth every 6 (six) hours as needed for mild pain (or temp >/= 101 F). (Patient not taking: Reported on 02/03/2023)     pravastatin (PRAVACHOL) 40 MG tablet Take 1 tablet (40 mg total) by mouth every evening. (Patient taking differently: Take 40 mg by mouth daily.) 90 tablet 3   No current facility-administered medications for this visit.     Review of Systems    She denies chest pain, palpitations, pnd, orthopnea, n, v, dizziness, syncope, edema, weight gain, or early satiety. All other systems reviewed and are otherwise negative except as noted above.   Physical Exam    VS:  BP 136/86   Pulse 82   Ht 5\' 3"  (1.6 m)   Wt 233 lb 3.2 oz (105.8 kg)   SpO2 97%   BMI 41.31 kg/m   GEN: Well nourished, well developed, in no acute distress. HEENT: normal. Neck: Supple, no JVD, carotid bruits, or masses. Cardiac: IRIR, no murmurs, rubs, or gallops. No clubbing, cyanosis, edema.  Radials/DP/PT 2+ and equal bilaterally.  Respiratory:  Respirations regular and unlabored, clear to auscultation bilaterally. GI: Soft, nontender, nondistended, BS + x  4. MS: no deformity or atrophy. Skin: warm and dry, no rash. Neuro:  Strength and sensation are intact. Psych: Normal affect.  Accessory Clinical Findings    ECG personally reviewed by me today -atrial fibrillation, 82 bpm- no acute changes.   Lab Results  Component Value Date   WBC 7.2 04/15/2022   HGB 12.0 04/15/2022   HCT 39.0 04/15/2022   MCV 66.1 (L) 04/15/2022   PLT 254 04/15/2022   Lab Results  Component Value Date   CREATININE 0.56 04/15/2022   BUN 22 04/15/2022   NA 140 04/15/2022   K 4.2 04/15/2022   CL 102 04/15/2022   CO2 26 04/15/2022   Lab Results  Component Value Date   ALT 33 (H) 01/27/2022   AST 29 01/27/2022   ALKPHOS 70 01/27/2022   BILITOT 0.6 01/27/2022   Lab Results  Component Value Date   CHOL 169 09/23/2021   HDL 66 09/23/2021   LDLCALC 85 09/23/2021   TRIG 102 09/23/2021   CHOLHDL 2.5 12/22/2019    Lab Results  Component Value Date   HGBA1C 5.6 01/27/2022    Assessment & Plan    1. Paroxysmal atrial fibrillation: Newly diagnosed.  EKG today shows rate controlled A-fib.  She has noticed increased dyspnea on exertion, decreased activity tolerance, mild fatigue.  These symptoms were more pronounced with elevated heart rate. Euvolemic and well compensated on exam. We discussed the diagnosis of atrial fibrillation, possible treatment strategies including DCCV.  She expressed interest in restoration of sinus rhythm.  She has only been taking Eliquis for 1 week.  Will have her return to clinic in 2 weeks.  If she remains in atrial fibrillation, consider DCCV time.  Recent labs including CBC, BMET, TSH were unremarkable.  Will check magnesium.  Discussed ED precautions.  Continue metoprolol, Eliquis.   2. Aortic stenosis: Most recent echo in 10/2022 showed EF 60 to 65%, normal LV function, no RWMA, mild LVH, G1 DD, normal RV systolic function, mild mitral valve regurgitation, moderate aortic stenosis, mean gradient 19 mmHg, increased from prior.   Asymptomatic.  Plan for repeat echo in 10/2023.    3. Carotid artery disease: Most recent carotid ultrasound in 10/2022 showed 80 to 99% R ICA stenosis, 1 to 39% LICA stenosis.  She was asymptomatic.  Follow-up CTA of the head  and neck was recommended in 6 months. Following with vascular surgery.  Will discuss  recommendations for aspirin therapy with Dr. Allyson Sabal given initiation of DOAC therapy.   4. Hypertension: BP well controlled. Continue current antihypertensive regimen.   5. Hyperlipidemia: LDL was 70 in 10/2022.  Continue Lipitor.  6. OSA: She has a history of sleep apnea, no longer on CPAP.  She declines repeat sleep study at this point in time.   7. Disposition: Follow-up in 2 weeks.      Joylene Grapes, NP 02/03/2023, 6:33 PM

## 2023-02-03 NOTE — Patient Instructions (Signed)
Medication Instructions:  Your physician recommends that you continue on your current medications as directed. Please refer to the Current Medication list given to you today.  *If you need a refill on your cardiac medications before your next appointment, please call your pharmacy*   Lab Work: Your physician recommends that you complete lab work today. Magnesium  If you have labs (blood work) drawn today and your tests are completely normal, you will receive your results only by: MyChart Message (if you have MyChart) OR A paper copy in the mail If you have any lab test that is abnormal or we need to change your treatment, we will call you to review the results.   Testing/Procedures: NONE ordered at this time of appointment     Follow-Up: At [facility], you and your health needs are our priority.  As part of our continuing mission to provide you with exceptional heart care, we have created designated Provider Care Teams.  These Care Teams include your primary Cardiologist (physician) and Advanced Practice Providers (APPs -  Physician Assistants and Nurse Practitioners) who all work together to provide you with the care you need, when you need it.  We recommend signing up for the patient portal called "MyChart".  Sign up information is provided on this After Visit Summary.  MyChart is used to connect with patients for Virtual Visits (Telemedicine).  Patients are able to view lab/test results, encounter notes, upcoming appointments, etc.  Non-urgent messages can be sent to your provider as well.   To learn more about what you can do with MyChart, go to [email or web address].    Your next appointment:   2 week(s)  Provider:   [name], NP        Other Instructions

## 2023-02-04 DIAGNOSIS — M1711 Unilateral primary osteoarthritis, right knee: Secondary | ICD-10-CM | POA: Diagnosis not present

## 2023-02-04 LAB — MAGNESIUM: Magnesium: 2.2 mg/dL (ref 1.6–2.3)

## 2023-02-08 NOTE — Addendum Note (Signed)
Addended by: Missy Sabins on: 02/08/2023 11:44 AM   Modules accepted: Orders

## 2023-02-11 ENCOUNTER — Telehealth: Payer: Self-pay

## 2023-02-11 DIAGNOSIS — M1711 Unilateral primary osteoarthritis, right knee: Secondary | ICD-10-CM | POA: Diagnosis not present

## 2023-02-11 NOTE — Telephone Encounter (Signed)
Lmom to discuss lab results. Waiting on a return call.  

## 2023-02-12 ENCOUNTER — Ambulatory Visit
Admission: RE | Admit: 2023-02-12 | Discharge: 2023-02-12 | Disposition: A | Discharge status: Home / Self Care | Payer: Medicare Other | Source: Ambulatory Visit | Attending: Medical | Admitting: Medical

## 2023-02-12 ENCOUNTER — Other Ambulatory Visit: Payer: Self-pay | Admitting: Medical

## 2023-02-12 DIAGNOSIS — M25551 Pain in right hip: Secondary | ICD-10-CM

## 2023-02-18 ENCOUNTER — Ambulatory Visit: Payer: Medicare Other | Attending: Nurse Practitioner | Admitting: Nurse Practitioner

## 2023-02-18 ENCOUNTER — Encounter: Payer: Self-pay | Admitting: Nurse Practitioner

## 2023-02-18 VITALS — BP 122/76 | HR 80 | Ht 63.0 in | Wt 231.6 lb

## 2023-02-18 DIAGNOSIS — E785 Hyperlipidemia, unspecified: Secondary | ICD-10-CM

## 2023-02-18 DIAGNOSIS — I4819 Other persistent atrial fibrillation: Secondary | ICD-10-CM

## 2023-02-18 DIAGNOSIS — I6523 Occlusion and stenosis of bilateral carotid arteries: Secondary | ICD-10-CM

## 2023-02-18 DIAGNOSIS — I10 Essential (primary) hypertension: Secondary | ICD-10-CM

## 2023-02-18 DIAGNOSIS — I350 Nonrheumatic aortic (valve) stenosis: Secondary | ICD-10-CM | POA: Diagnosis not present

## 2023-02-18 DIAGNOSIS — G4733 Obstructive sleep apnea (adult) (pediatric): Secondary | ICD-10-CM | POA: Diagnosis not present

## 2023-02-18 DIAGNOSIS — M1711 Unilateral primary osteoarthritis, right knee: Secondary | ICD-10-CM | POA: Diagnosis not present

## 2023-02-18 MED ORDER — METOPROLOL SUCCINATE ER 50 MG PO TB24: 50.0000 mg | ORAL_TABLET | Freq: Every day | ORAL | 3 refills | Status: DC

## 2023-02-18 MED ORDER — APIXABAN 5 MG PO TABS: 5.0000 mg | ORAL_TABLET | Freq: Two times a day (BID) | ORAL | 3 refills | Status: DC

## 2023-02-18 NOTE — Patient Instructions (Signed)
Medication Instructions:  Stop Asprin as directed and continue Eliquis 5 mg twice daily   *If you need a refill on your cardiac medications before your next appointment, please call your pharmacy*   Lab Work: BMET & CBC today.  If you have labs (blood work) drawn today and your tests are completely normal, you will receive your results only by: MyChart Message (if you have MyChart) OR A paper copy in the mail If you have any lab test that is abnormal or we need to change your treatment, we will call you to review the results.   Testing/Procedures: Your physician has requested that you have a Cardioversion. During a TEE, sound waves are used to create images of your heart. It provides your doctor with information about the size and shape of your heart and how well your heart's chambers and valves are working. In this test, a transducer is attached to the end of a flexible tube that is guided down you throat and into your esophagus (the tube leading from your mouth to your stomach) to get a more detailed image of your heart. Once the TEE has determined that a blood clot is not present, the cardioversion begins. Electrical Cardioversion uses a jolt of electricity to your heart either through paddles or wired patches attached to your chest. This is a controlled, usually prescheduled, procedure. This procedure is done at the hospital and you are not awake during the procedure. You usually go home the day of the procedure. Please see the instruction sheet given to you today for more information.   Follow-Up: At [facility], you and your health needs are our priority.  As part of our continuing mission to provide you with exceptional heart care, we have created designated Provider Care Teams.  These Care Teams include your primary Cardiologist (physician) and Advanced Practice Providers (APPs -  Physician Assistants and Nurse Practitioners) who all work together to provide you with the care you  need, when you need it.  We recommend signing up for the patient portal called "MyChart".  Sign up information is provided on this After Visit Summary.  MyChart is used to connect with patients for Virtual Visits (Telemedicine).  Patients are able to view lab/test results, encounter notes, upcoming appointments, etc.  Non-urgent messages can be sent to your provider as well.   To learn more about what you can do with MyChart, go to [email or web address].    Your next appointment:   4 week(s)  Provider:   [name], NP        Other Instructions     Dear Kristina [name]  You are scheduled for a Cardioversion on Thursday, May 23 with Dr. Cristal Deer.  Please arrive at the [facility] (Main Entrance A) at [facility]: [address] [place], [place] [address] at 11:30 AM (This time is 1 hour(s) before your procedure to ensure your preparation). Free valet parking service is available. You will check in at ADMITTING. The support person will be asked to wait in the waiting room.  It is OK to have someone drop you off and come back when you are ready to be discharged.      DIET:  Nothing to eat or drink after midnight except a sip of water with medications (see medication instructions below)  MEDICATION INSTRUCTIONS: !!IF ANY NEW MEDICATIONS ARE STARTED AFTER TODAY, PLEASE NOTIFY YOUR PROVIDER AS SOON AS POSSIBLE!!  FYI: Medications such as Semaglutide (Ozempic, [place]), Tirzepatide (Mounjaro, Zepbound), Dulaglutide (Trulicity),  etc ("GLP1 agonists") must be held around the time of a procedure. Talk to your provider if you take one of these.  Continue taking your anticoagulant (blood thinner): Apixaban (Eliquis).  You will need to continue this after your procedure until you are told by your provider that it is safe to stop.    LABS: labs completed today  FYI:  For your safety, and to allow [name] to monitor your vital signs accurately during the surgery/procedure we request: If you  have artificial nails, gel coating, SNS etc, please have those removed prior to your surgery/procedure. Not having the nail coverings /polish removed may result in cancellation or delay of your surgery/procedure.  You must have a responsible person to drive you home and stay in the waiting area during your procedure. Failure to do so could result in cancellation.  Bring your insurance cards.  *Special Note: Every effort is made to have your procedure done on time. Occasionally there are emergencies that occur at the hospital that may cause delays. Please be patient if a delay does occur.

## 2023-02-18 NOTE — H&P (View-Only) (Signed)
 Office Visit    Patient Name: Kristina [name] Date of Encounter: 02/18/2023  Primary Care Provider:  Miller, Lisa, MD Primary Cardiologist:  Jonathan Berry, MD  Chief Complaint    77-year-old female with a history of paroxysmal atrial fibrillation, aortic stenosis, carotid artery disease, hypertension, and hyperlipidemia who presents for follow-up related to atrial fibrillation.  Past Medical History    Past Medical History:  Diagnosis Date   Allergic rhinitis    Anemia glaucoma   Anxiety    Back pain    Cataract    Mixed OU   DDD (degenerative disc disease), lumbar    High blood pressure    High cholesterol    History of stomach ulcers    Hypertensive retinopathy    OU   Rheumatic fever    Rheumatic fever    Rheumatoid arteritis (HCC)    Past Surgical History:  Procedure Laterality Date   APPENDECTOMY  1955   CHOLECYSTECTOMY  1963   DILATATION & CURETTAGE/HYSTEROSCOPY WITH MYOSURE N/A 04/15/2022   Procedure: DILATATION & CURETTAGE/HYSTEROSCOPY WITH MYOSURE;  Surgeon: Cole, Tara, MD;  Location: [facility] OR;  Service: Gynecology;  Laterality: N/A;   ENDARTERECTOMY Left 09/27/2019   Procedure: ENDARTERECTOMY CAROTID LEFT;  Surgeon: Brabham, Vance W, MD;  Location: [facility] OR;  Service: Vascular;  Laterality: Left;   HERNIA REPAIR  2007   HERNIA REPAIR  2015   PATCH ANGIOPLASTY Left 09/27/2019   Procedure: Patch Angioplasty Using Xenosure;  Surgeon: Brabham, Vance W, MD;  Location: [facility] OR;  Service: Vascular;  Laterality: Left;   REPLACEMENT TOTAL KNEE  2016   TONSILLECTOMY      Allergies  Allergies  Allergen Reactions   Penicillins Anaphylaxis and Itching    Did it involve swelling of the face/tongue/throat, SOB, or low BP? Yes Did it involve sudden or severe rash/hives, skin peeling, or any reaction on the inside of your mouth or nose? No Did you need to seek medical attention at a hospital or doctor's office? Yes When did it last happen?      Childhood allergy If all  above answers are "NO", may proceed with cephalosporin use.    Mite (D. Farinae)     Other reaction(s): sinus swelling     Labs/Other Studies Reviewed    The following studies were reviewed today: Echo 10/2022: IMPRESSIONS     1. Left ventricular ejection fraction, by estimation, is 60 to 65%. The  left ventricle has normal function. The left ventricle has no regional  wall motion abnormalities. There is mild left ventricular hypertrophy.  Left ventricular diastolic parameters  are consistent with Grade I diastolic dysfunction (impaired relaxation).  Elevated left ventricular end-diastolic pressure.   2. Right ventricular systolic function is normal. The right ventricular  size is normal.   3. Left atrial size was moderately dilated.   4. The mitral valve is abnormal. Mild mitral valve regurgitation. No  evidence of mitral stenosis. Moderate mitral annular calcification.   5. Gradients have increased since TTE done 09/08/21 . The aortic valve is  tricuspid. There is moderate calcification of the aortic valve. There is  moderate thickening of the aortic valve. Aortic valve regurgitation is not  visualized. Moderate aortic  valve stenosis.   6. The inferior vena cava is normal in size with greater than 50%  respiratory variability, suggesting right atrial pressure of 3 mmHg.       Recent Labs: 04/15/2022: BUN 22; Creatinine, Ser 0.56; Hemoglobin 12.0; Platelets 254; Potassium 4.2; Sodium 140   02/03/2023: Magnesium 2.2  Recent Lipid Panel    Component Value Date/Time   CHOL 169 09/23/2021 0858   TRIG 102 09/23/2021 0858   HDL 66 09/23/2021 0858   CHOLHDL 2.5 12/22/2019 1128   LDLCALC 85 09/23/2021 0858    History of Present Illness    77-year-old female with the above past medical history including paroxysmal atrial fibrillation, aortic stenosis, carotid artery disease, hypertension, and hyperlipidemia.   Echocardiogram in 09/2021 showed normal LV systolic function, G1 DD,  mild aortic stenosis.  Carotid Dopplers at the time revealed moderate R ICA and high-grade LICA stenosis.  She was referred to vascular surgery.  Repeat echocardiogram in 10/2022 showed EF 60 to 65%, normal LV function, no RWMA, mild LVH, G1 DD, normal RV systolic function, mild mitral valve regurgitation, moderate aortic stenosis, mean gradient 19 mmHg, increased from prior.  Repeat echocardiogram was recommended in 1 year.  Most recent carotid ultrasound in 10/2022 showed 80 to 99% R ICA stenosis, 1 to 39% LICA stenosis.  She was asymptomatic.  Follow-up CTA of the head and neck was recommended in 6 months.  She saw her PCP in 01/2023 who identified new atrial fibrillation with RVR. She was started on Eliquis and metoprolol and advised to follow-up with cardiology as an outpatient.  Labs including BMET, CBC, and TSH were unremarkable. She was last seen in the office on 02/03/2023 and was stable overall from a cardiac standpoint though she did note mildly decreased activity tolerance with atrial fibrillation.  It was noted that should she remain in atrial fibrillation, DCCV could be considered.   She presents today for follow-up.  Since her last visit she has been stable from a cardiac standpoint.  She denies any palpitations, dyspnea, denies symptoms concerning for angina.  She is eager to proceed with cardioversion in hopes of restoring normal sinus rhythm.  She reports adherence to her Eliquis.  Overall, she reports feeling well.  Home Medications    Current Outpatient Medications  Medication Sig Dispense Refill   acetaminophen (TYLENOL) 325 MG tablet Take 1-2 tablets (325-650 mg total) by mouth every 6 (six) hours as needed for mild pain (or temp >/= 101 F).     atorvastatin (LIPITOR) 20 MG tablet Take 20 mg by mouth every morning.     cyanocobalamin (VITAMIN B12) 1000 MCG tablet Take 1,000 mcg by mouth daily.     dorzolamide-timolol (COSOPT) 22.3-6.8 MG/ML ophthalmic solution Place 1 drop into both eyes  2 (two) times daily. 10 mL 4   ELDERBERRY PO Take 2 capsules by mouth daily. Airborne     escitalopram (LEXAPRO) 10 MG tablet Take 10 mg by mouth daily.     ferrous sulfate 325 (65 FE) MG EC tablet Take 325 mg by mouth daily.     GLUCOSAMINE-CHONDROITIN PO Take 1 tablet by mouth 2 (two) times daily.      hydrochlorothiazide (HYDRODIURIL) 25 MG tablet Take 25 mg by mouth daily.      ibuprofen (ADVIL) 600 MG tablet Take 1 tablet (600 mg total) by mouth every 6 (six) hours as needed. 30 tablet 0   losartan (COZAAR) 100 MG tablet Take 100 mg by mouth daily.     Multiple Vitamin (MULTIVITAMIN) capsule Take 1 capsule by mouth daily. Vita fusion     Multiple Vitamins-Minerals (AIRBORNE GUMMIES PO) Take 3 tablets by mouth daily. immune system     Vitamin D, Ergocalciferol, (DRISDOL) 1.25 MG (50000 UNIT) CAPS capsule Take 1 capsule (50,000 Units total) by   mouth every 7 (seven) days. 4 capsule 0   apixaban (ELIQUIS) 5 MG TABS tablet Take 1 tablet (5 mg total) by mouth 2 (two) times daily. Pt. Was given samples from her PCP enough to last one month 180 tablet 3   metoprolol succinate (TOPROL-XL) 50 MG 24 hr tablet Take 1 tablet (50 mg total) by mouth daily. 90 tablet 3   pravastatin (PRAVACHOL) 40 MG tablet Take 1 tablet (40 mg total) by mouth every evening. (Patient taking differently: Take 40 mg by mouth daily.) 90 tablet 3   No current facility-administered medications for this visit.     Review of Systems   She denies chest pain, palpitations, dyspnea, pnd, orthopnea, n, v, dizziness, syncope, edema, weight gain, or early satiety. All other systems reviewed and are otherwise negative except as noted above.   Physical Exam    VS:  BP 122/76 (BP Location: Left Arm, Patient Position: Sitting, Cuff Size: Large)   Pulse 80   Ht 5' 3" (1.6 m)   Wt 231 lb 9.6 oz (105.1 kg)   SpO2 96%   BMI 41.03 kg/m    GEN: Well nourished, well developed, in no acute distress. HEENT: normal. Neck: Supple, no JVD,  carotid bruits, or masses. Cardiac: IRIR, 2/6 murmur, no rubs, or gallops. No clubbing, cyanosis, edema.  Radials/DP/PT 2+ and equal bilaterally.  Respiratory:  Respirations regular and unlabored, clear to auscultation bilaterally. GI: Soft, nontender, nondistended, BS + x 4. MS: no deformity or atrophy. Skin: warm and dry, no rash. Neuro:  Strength and sensation are intact. Psych: Normal affect.  Accessory Clinical Findings    ECG personally reviewed by me today -atrial fibrillation, 80 bpm- no acute changes.   Lab Results  Component Value Date   WBC 7.2 04/15/2022   HGB 12.0 04/15/2022   HCT 39.0 04/15/2022   MCV 66.1 (L) 04/15/2022   PLT 254 04/15/2022   Lab Results  Component Value Date   CREATININE 0.56 04/15/2022   BUN 22 04/15/2022   NA 140 04/15/2022   K 4.2 04/15/2022   CL 102 04/15/2022   CO2 26 04/15/2022   Lab Results  Component Value Date   ALT 33 (H) 01/27/2022   AST 29 01/27/2022   ALKPHOS 70 01/27/2022   BILITOT 0.6 01/27/2022   Lab Results  Component Value Date   CHOL 169 09/23/2021   HDL 66 09/23/2021   LDLCALC 85 09/23/2021   TRIG 102 09/23/2021   CHOLHDL 2.5 12/22/2019    Lab Results  Component Value Date   HGBA1C 5.6 01/27/2022    Assessment & Plan    1. Persistent atrial fibrillation: Diagnosed in 01/2023.  EKG today shows rate controlled A-fib.  Initial symptoms included dyspnea on exertion, decreased activity tolerance, mild fatigue, more pronounced with elevated heart rate.  Symptoms have improved with bettter HR control.  Recent labs including CBC, BMET, TSH, and Mg were unremarkable.  Through shared decision making, will pursue DCCV. DCCV has been scheduled for 12/26/22 with Dr. Christopher.  Advised ongoing adherence to Eliquis. Will update CBC, BMET today.  Discussed ED precautions.  Continue metoprolol, Eliquis.   Shared Decision Making/Informed Consent The risks (stroke, cardiac arrhythmias rarely resulting in the need for a  temporary or permanent pacemaker, skin irritation or burns and complications associated with conscious sedation including aspiration, arrhythmia, respiratory failure and death), benefits (restoration of normal sinus rhythm) and alternatives of a direct current cardioversion were explained in detail to Kristina [name] and she agrees   to proceed.   2. Aortic stenosis: Most recent echo in 10/2022 showed EF 60 to 65%, normal LV function, no RWMA, mild LVH, G1 DD, normal RV systolic function, mild mitral valve regurgitation, moderate aortic stenosis, mean gradient 19 mmHg, increased from prior.  Asymptomatic.  Plan for repeat echo in 10/2023.     3. Carotid artery disease: Most recent carotid ultrasound in 10/2022 showed 80 to 99% R ICA stenosis, 1 to 39% LICA stenosis. She was asymptomatic. Follow-up CTA of the head and neck was recommended in 6 months. Following with vascular surgery.  Per Dr. Berry, ok to D/C Aspirin given chronic DOAC therapy. Continue Lipitor.   4. Hypertension: BP well controlled. Continue current antihypertensive regimen.    5. Hyperlipidemia: LDL was 70 in 10/2022.  Continue Lipitor.   6. OSA: She has a history of sleep apnea, no longer on CPAP.  She declines repeat sleep study at this point in time.    7. Disposition: Follow-up 3 weeks post DCCV.  Follow-up in 6 months with Dr. Berry.      Makylee Sanborn C Seleni Meller, NP 02/18/2023, 10:13 AM   

## 2023-02-18 NOTE — Progress Notes (Signed)
Office Visit    Patient Name: Kristina [name] Date of Encounter: 02/18/2023  Primary Care Provider:  Sigmund Hazel, MD Primary Cardiologist:  Nanetta Batty, MD  Chief Complaint    77 year old female with a history of paroxysmal atrial fibrillation, aortic stenosis, carotid artery disease, hypertension, and hyperlipidemia who presents for follow-up related to atrial fibrillation.  Past Medical History    Past Medical History:  Diagnosis Date   Allergic rhinitis    Anemia glaucoma   Anxiety    Back pain    Cataract    Mixed OU   DDD (degenerative disc disease), lumbar    High blood pressure    High cholesterol    History of stomach ulcers    Hypertensive retinopathy    OU   Rheumatic fever    Rheumatic fever    Rheumatoid arteritis [facility])    Past Surgical History:  Procedure Laterality Date   APPENDECTOMY  1955   CHOLECYSTECTOMY  1963   DILATATION & CURETTAGE/HYSTEROSCOPY WITH MYOSURE N/A 04/15/2022   Procedure: DILATATION & CURETTAGE/HYSTEROSCOPY WITH MYOSURE;  Surgeon: Gerald Leitz, MD;  Location: [facility] OR;  Service: Gynecology;  Laterality: N/A;   ENDARTERECTOMY Left 09/27/2019   Procedure: ENDARTERECTOMY CAROTID LEFT;  Surgeon: Nada Libman, MD;  Location: [facility] OR;  Service: Vascular;  Laterality: Left;   HERNIA REPAIR  2007   HERNIA REPAIR  2015   PATCH ANGIOPLASTY Left 09/27/2019   Procedure: Patch Angioplasty Using Livia Snellen;  Surgeon: Nada Libman, MD;  Location: [facility] OR;  Service: Vascular;  Laterality: Left;   REPLACEMENT TOTAL KNEE  2016   TONSILLECTOMY      Allergies  Allergies  Allergen Reactions   Penicillins Anaphylaxis and Itching    Did it involve swelling of the face/tongue/throat, SOB, or low BP? Yes Did it involve sudden or severe rash/hives, skin peeling, or any reaction on the inside of your mouth or nose? No Did you need to seek medical attention at a hospital or doctor's office? Yes When did it last happen?      Childhood allergy If all  above answers are "NO", may proceed with cephalosporin use.    Mite (D. Farinae)     Other reaction(s): sinus swelling     Labs/Other Studies Reviewed    The following studies were reviewed today: Echo 10/2022: IMPRESSIONS     1. Left ventricular ejection fraction, by estimation, is 60 to 65%. The  left ventricle has normal function. The left ventricle has no regional  wall motion abnormalities. There is mild left ventricular hypertrophy.  Left ventricular diastolic parameters  are consistent with Grade I diastolic dysfunction (impaired relaxation).  Elevated left ventricular end-diastolic pressure.   2. Right ventricular systolic function is normal. The right ventricular  size is normal.   3. Left atrial size was moderately dilated.   4. The mitral valve is abnormal. Mild mitral valve regurgitation. No  evidence of mitral stenosis. Moderate mitral annular calcification.   5. Gradients have increased since TTE done 09/08/21 . The aortic valve is  tricuspid. There is moderate calcification of the aortic valve. There is  moderate thickening of the aortic valve. Aortic valve regurgitation is not  visualized. Moderate aortic  valve stenosis.   6. The inferior vena cava is normal in size with greater than 50%  respiratory variability, suggesting right atrial pressure of 3 mmHg.       Recent Labs: 04/15/2022: BUN 22; Creatinine, Ser 0.56; Hemoglobin 12.0; Platelets 254; Potassium 4.2; Sodium 140  02/03/2023: Magnesium 2.2  Recent Lipid Panel    Component Value Date/Time   CHOL 169 09/23/2021 0858   TRIG 102 09/23/2021 0858   HDL 66 09/23/2021 0858   CHOLHDL 2.5 12/22/2019 1128   LDLCALC 85 09/23/2021 0858    History of Present Illness    77 year old female with the above past medical history including paroxysmal atrial fibrillation, aortic stenosis, carotid artery disease, hypertension, and hyperlipidemia.   Echocardiogram in 09/2021 showed normal LV systolic function, G1 DD,  mild aortic stenosis.  Carotid Dopplers at the time revealed moderate R ICA and high-grade LICA stenosis.  She was referred to vascular surgery.  Repeat echocardiogram in 10/2022 showed EF 60 to 65%, normal LV function, no RWMA, mild LVH, G1 DD, normal RV systolic function, mild mitral valve regurgitation, moderate aortic stenosis, mean gradient 19 mmHg, increased from prior.  Repeat echocardiogram was recommended in 1 year.  Most recent carotid ultrasound in 10/2022 showed 80 to 99% R ICA stenosis, 1 to 39% LICA stenosis.  She was asymptomatic.  Follow-up CTA of the head and neck was recommended in 6 months.  She saw her PCP in 01/2023 who identified new atrial fibrillation with RVR. She was started on Eliquis and metoprolol and advised to follow-up with cardiology as an outpatient.  Labs including BMET, CBC, and TSH were unremarkable. She was last seen in the office on 02/03/2023 and was stable overall from a cardiac standpoint though she did note mildly decreased activity tolerance with atrial fibrillation.  It was noted that should she remain in atrial fibrillation, DCCV could be considered.   She presents today for follow-up.  Since her last visit she has been stable from a cardiac standpoint.  She denies any palpitations, dyspnea, denies symptoms concerning for angina.  She is eager to proceed with cardioversion in hopes of restoring normal sinus rhythm.  She reports adherence to her Eliquis.  Overall, she reports feeling well.  Home Medications    Current Outpatient Medications  Medication Sig Dispense Refill   acetaminophen (TYLENOL) 325 MG tablet Take 1-2 tablets (325-650 mg total) by mouth every 6 (six) hours as needed for mild pain (or temp >/= 101 F).     atorvastatin (LIPITOR) 20 MG tablet Take 20 mg by mouth every morning.     cyanocobalamin (VITAMIN B12) 1000 MCG tablet Take 1,000 mcg by mouth daily.     dorzolamide-timolol (COSOPT) 22.3-6.8 MG/ML ophthalmic solution Place 1 drop into both eyes  2 (two) times daily. 10 mL 4   ELDERBERRY PO Take 2 capsules by mouth daily. Airborne     escitalopram (LEXAPRO) 10 MG tablet Take 10 mg by mouth daily.     ferrous sulfate 325 (65 FE) MG EC tablet Take 325 mg by mouth daily.     GLUCOSAMINE-CHONDROITIN PO Take 1 tablet by mouth 2 (two) times daily.      hydrochlorothiazide (HYDRODIURIL) 25 MG tablet Take 25 mg by mouth daily.      ibuprofen (ADVIL) 600 MG tablet Take 1 tablet (600 mg total) by mouth every 6 (six) hours as needed. 30 tablet 0   losartan (COZAAR) 100 MG tablet Take 100 mg by mouth daily.     Multiple Vitamin (MULTIVITAMIN) capsule Take 1 capsule by mouth daily. Vita fusion     Multiple Vitamins-Minerals (AIRBORNE GUMMIES PO) Take 3 tablets by mouth daily. immune system     Vitamin D, Ergocalciferol, (DRISDOL) 1.25 MG (50000 UNIT) CAPS capsule Take 1 capsule (50,000 Units total) by  mouth every 7 (seven) days. 4 capsule 0   apixaban (ELIQUIS) 5 MG TABS tablet Take 1 tablet (5 mg total) by mouth 2 (two) times daily. Pt. Was given samples from her PCP enough to last one month 180 tablet 3   metoprolol succinate (TOPROL-XL) 50 MG 24 hr tablet Take 1 tablet (50 mg total) by mouth daily. 90 tablet 3   pravastatin (PRAVACHOL) 40 MG tablet Take 1 tablet (40 mg total) by mouth every evening. (Patient taking differently: Take 40 mg by mouth daily.) 90 tablet 3   No current facility-administered medications for this visit.     Review of Systems   She denies chest pain, palpitations, dyspnea, pnd, orthopnea, n, v, dizziness, syncope, edema, weight gain, or early satiety. All other systems reviewed and are otherwise negative except as noted above.   Physical Exam    VS:  BP 122/76 (BP Location: Left Arm, Patient Position: Sitting, Cuff Size: Large)   Pulse 80   Ht 5\' 3"  (1.6 m)   Wt 231 lb 9.6 oz (105.1 kg)   SpO2 96%   BMI 41.03 kg/m    GEN: Well nourished, well developed, in no acute distress. HEENT: normal. Neck: Supple, no JVD,  carotid bruits, or masses. Cardiac: IRIR, 2/6 murmur, no rubs, or gallops. No clubbing, cyanosis, edema.  Radials/DP/PT 2+ and equal bilaterally.  Respiratory:  Respirations regular and unlabored, clear to auscultation bilaterally. GI: Soft, nontender, nondistended, BS + x 4. MS: no deformity or atrophy. Skin: warm and dry, no rash. Neuro:  Strength and sensation are intact. Psych: Normal affect.  Accessory Clinical Findings    ECG personally reviewed by me today -atrial fibrillation, 80 bpm- no acute changes.   Lab Results  Component Value Date   WBC 7.2 04/15/2022   HGB 12.0 04/15/2022   HCT 39.0 04/15/2022   MCV 66.1 (L) 04/15/2022   PLT 254 04/15/2022   Lab Results  Component Value Date   CREATININE 0.56 04/15/2022   BUN 22 04/15/2022   NA 140 04/15/2022   K 4.2 04/15/2022   CL 102 04/15/2022   CO2 26 04/15/2022   Lab Results  Component Value Date   ALT 33 (H) 01/27/2022   AST 29 01/27/2022   ALKPHOS 70 01/27/2022   BILITOT 0.6 01/27/2022   Lab Results  Component Value Date   CHOL 169 09/23/2021   HDL 66 09/23/2021   LDLCALC 85 09/23/2021   TRIG 102 09/23/2021   CHOLHDL 2.5 12/22/2019    Lab Results  Component Value Date   HGBA1C 5.6 01/27/2022    Assessment & Plan    1. Persistent atrial fibrillation: Diagnosed in 01/2023.  EKG today shows rate controlled A-fib.  Initial symptoms included dyspnea on exertion, decreased activity tolerance, mild fatigue, more pronounced with elevated heart rate.  Symptoms have improved with bettter HR control.  Recent labs including CBC, BMET, TSH, and Mg were unremarkable.  Through shared decision making, will pursue DCCV. DCCV has been scheduled for 12/26/22 with Dr. Cristal Deer.  Advised ongoing adherence to Eliquis. Will update CBC, BMET today.  Discussed ED precautions.  Continue metoprolol, Eliquis.   Shared Decision Making/Informed Consent The risks (stroke, cardiac arrhythmias rarely resulting in the need for a  temporary or permanent pacemaker, skin irritation or burns and complications associated with conscious sedation including aspiration, arrhythmia, respiratory failure and death), benefits (restoration of normal sinus rhythm) and alternatives of a direct current cardioversion were explained in detail to Ms. Coolidge and she agrees  to proceed.   2. Aortic stenosis: Most recent echo in 10/2022 showed EF 60 to 65%, normal LV function, no RWMA, mild LVH, G1 DD, normal RV systolic function, mild mitral valve regurgitation, moderate aortic stenosis, mean gradient 19 mmHg, increased from prior.  Asymptomatic.  Plan for repeat echo in 10/2023.     3. Carotid artery disease: Most recent carotid ultrasound in 10/2022 showed 80 to 99% R ICA stenosis, 1 to 39% LICA stenosis. She was asymptomatic. Follow-up CTA of the head and neck was recommended in 6 months. Following with vascular surgery.  Per Dr. Allyson Sabal, ok to D/C Aspirin given chronic DOAC therapy. Continue Lipitor.   4. Hypertension: BP well controlled. Continue current antihypertensive regimen.    5. Hyperlipidemia: LDL was 70 in 10/2022.  Continue Lipitor.   6. OSA: She has a history of sleep apnea, no longer on CPAP.  She declines repeat sleep study at this point in time.    7. Disposition: Follow-up 3 weeks post DCCV.  Follow-up in 6 months with Dr. Allyson Sabal.      Joylene Grapes, NP 02/18/2023, 10:13 AM

## 2023-02-19 LAB — BASIC METABOLIC PANEL
BUN/Creatinine Ratio: 24 (ref 12–28)
BUN: 17 mg/dL (ref 8–27)
CO2: 29 mmol/L (ref 20–29)
Calcium: 9.7 mg/dL (ref 8.7–10.3)
Chloride: 101 mmol/L (ref 96–106)
Creatinine, Ser: 0.71 mg/dL (ref 0.57–1.00)
Glucose: 85 mg/dL (ref 70–99)
Potassium: 5 mmol/L (ref 3.5–5.2)
Sodium: 141 mmol/L (ref 134–144)
eGFR: 88 mL/min/{1.73_m2} (ref >59)

## 2023-02-19 LAB — CBC
Hematocrit: 40.3 % (ref 34.0–46.6)
Hemoglobin: 12 g/dL (ref 11.1–15.9)
MCH: 20 pg — ABNORMAL LOW (ref 26.6–33.0)
MCHC: 29.8 g/dL — ABNORMAL LOW (ref 31.5–35.7)
MCV: 67 fL — ABNORMAL LOW (ref 79–97)
Platelets: 294 10*3/uL (ref 150–450)
RBC: 5.99 x10E6/uL — ABNORMAL HIGH (ref 3.77–5.28)
RDW: 18.1 % — ABNORMAL HIGH (ref 11.7–15.4)
WBC: 6.9 10*3/uL (ref 3.4–10.8)

## 2023-02-24 NOTE — Progress Notes (Addendum)
Spoke with  patient, Procedure scheduled for 1230, Please arrive at the hospital at 1130, NPO after midnight on   Wednesday, May take meds with sips of water in the AM, please have transportation for home post procedure, and someone to stay with pt for approximately 24 hours after  Pt states she has been taking eliquis regularly and hasn't missed any doses

## 2023-02-25 ENCOUNTER — Encounter (HOSPITAL_COMMUNITY)
Admission: RE | Disposition: A | Discharge status: Home / Self Care | Payer: Self-pay | Source: Home / Self Care | Attending: Cardiology

## 2023-02-25 ENCOUNTER — Ambulatory Visit (HOSPITAL_COMMUNITY)
Admission: RE | Admit: 2023-02-25 | Discharge: 2023-02-25 | Disposition: A | Discharge status: Home / Self Care | Payer: Medicare Other | Attending: Cardiology | Admitting: Cardiology

## 2023-02-25 ENCOUNTER — Ambulatory Visit (HOSPITAL_BASED_OUTPATIENT_CLINIC_OR_DEPARTMENT_OTHER): Payer: Medicare Other | Admitting: Certified Registered Nurse Anesthetist

## 2023-02-25 ENCOUNTER — Encounter (HOSPITAL_COMMUNITY): Payer: Self-pay | Admitting: Cardiology

## 2023-02-25 ENCOUNTER — Ambulatory Visit (HOSPITAL_COMMUNITY): Payer: Medicare Other | Admitting: Certified Registered Nurse Anesthetist

## 2023-02-25 ENCOUNTER — Other Ambulatory Visit: Payer: Self-pay

## 2023-02-25 DIAGNOSIS — E669 Obesity, unspecified: Secondary | ICD-10-CM

## 2023-02-25 DIAGNOSIS — I4891 Unspecified atrial fibrillation: Secondary | ICD-10-CM

## 2023-02-25 DIAGNOSIS — I10 Essential (primary) hypertension: Secondary | ICD-10-CM | POA: Insufficient documentation

## 2023-02-25 DIAGNOSIS — F419 Anxiety disorder, unspecified: Secondary | ICD-10-CM | POA: Diagnosis not present

## 2023-02-25 DIAGNOSIS — Z7901 Long term (current) use of anticoagulants: Secondary | ICD-10-CM | POA: Diagnosis not present

## 2023-02-25 DIAGNOSIS — Z79899 Other long term (current) drug therapy: Secondary | ICD-10-CM | POA: Diagnosis not present

## 2023-02-25 DIAGNOSIS — I700 Atherosclerosis of aorta: Secondary | ICD-10-CM | POA: Insufficient documentation

## 2023-02-25 DIAGNOSIS — G4733 Obstructive sleep apnea (adult) (pediatric): Secondary | ICD-10-CM | POA: Diagnosis not present

## 2023-02-25 DIAGNOSIS — I4819 Other persistent atrial fibrillation: Secondary | ICD-10-CM

## 2023-02-25 DIAGNOSIS — Z6838 Body mass index (BMI) 38.0-38.9, adult: Secondary | ICD-10-CM

## 2023-02-25 DIAGNOSIS — Z87891 Personal history of nicotine dependence: Secondary | ICD-10-CM

## 2023-02-25 DIAGNOSIS — I080 Rheumatic disorders of both mitral and aortic valves: Secondary | ICD-10-CM | POA: Diagnosis not present

## 2023-02-25 DIAGNOSIS — E7800 Pure hypercholesterolemia, unspecified: Secondary | ICD-10-CM | POA: Insufficient documentation

## 2023-02-25 DIAGNOSIS — E785 Hyperlipidemia, unspecified: Secondary | ICD-10-CM | POA: Diagnosis not present

## 2023-02-25 HISTORY — PX: CARDIOVERSION: SHX1299

## 2023-02-25 SURGERY — CARDIOVERSION
Anesthesia: General

## 2023-02-25 MED ORDER — PROPOFOL 10 MG/ML IV BOLUS
INTRAVENOUS | Status: DC | PRN
  Administered 2023-02-25: 50 mg via INTRAVENOUS

## 2023-02-25 MED ORDER — LIDOCAINE 2% (20 MG/ML) 5 ML SYRINGE
INTRAMUSCULAR | Status: DC | PRN
  Administered 2023-02-25: 50 mg via INTRAVENOUS

## 2023-02-25 MED ORDER — SODIUM CHLORIDE 0.9 % IV SOLN: INTRAVENOUS | Status: DC

## 2023-02-25 SURGICAL SUPPLY — 1 items: ELECT DEFIB PAD ADLT CADENCE (PAD) ×1 IMPLANT

## 2023-02-25 NOTE — Anesthesia Procedure Notes (Signed)
Procedure Name: General with mask airway Date/Time: 02/25/2023 12:17 PM  Performed by: Cy Blamer, CRNAPre-anesthesia Checklist: Patient identified, Emergency Drugs available, Suction available, Patient being monitored and Timeout performed Patient Re-evaluated:Patient Re-evaluated prior to induction Oxygen Delivery Method: Nasal cannula Placement Confirmation: positive ETCO2 Dental Injury: Teeth and Oropharynx as per pre-operative assessment

## 2023-02-25 NOTE — Transfer of Care (Signed)
Immediate Anesthesia Transfer of Care Note  Patient: Kristina [name]  Procedure(s) Performed: CARDIOVERSION  Patient Location: Cath Lab  Anesthesia Type:General  Level of Consciousness: awake, alert , and oriented  Airway & Oxygen Therapy: Patient Spontanous Breathing and Patient connected to nasal cannula oxygen  Post-op Assessment: Report given to RN, Post -op Vital signs reviewed and stable, Patient moving all extremities X 4, and Patient able to stick tongue midline  Post vital signs: Reviewed  Last Vitals:  Vitals Value Taken Time  BP 115/68   Temp 98.6   Pulse 51 02/25/23 1222  Resp 18 02/25/23 1222  SpO2 95 % 02/25/23 1222    Last Pain:  Vitals:   02/25/23 1150  TempSrc:   PainSc: 0-No pain         Complications: No notable events documented.

## 2023-02-25 NOTE — Anesthesia Preprocedure Evaluation (Signed)
Anesthesia Evaluation  Patient identified by MRN, date of birth, ID band Patient awake    Reviewed: Allergy & Precautions, NPO status , Patient's Chart, lab work & pertinent test results, reviewed documented beta blocker date and time   Airway Mallampati: III  TM Distance: >3 FB Neck ROM: Full    Dental  (+) Upper Dentures, Lower Dentures   Pulmonary former smoker   breath sounds clear to auscultation + decreased breath sounds      Cardiovascular hypertension, Pt. on medications and Pt. on home beta blockers  Rhythm:Irregular Rate:Normal     Neuro/Psych negative neurological ROS  negative psych ROS   GI/Hepatic negative GI ROS, Neg liver ROS,,,  Endo/Other  Obesity  Renal/GU negative Renal ROS  negative genitourinary   Musculoskeletal  (+) Arthritis , Osteoarthritis,    Abdominal  (+) + obese  Peds  Hematology  (+) Blood dyscrasia, anemia Eliquis therapy- last dose this am   Anesthesia Other Findings   Reproductive/Obstetrics                             Anesthesia Physical Anesthesia Plan  ASA: 3  Anesthesia Plan: General   Post-op Pain Management: Minimal or no pain anticipated   Induction: Intravenous  PONV Risk Score and Plan: 3 and Treatment may vary due to age or medical condition  Airway Management Planned: Natural Airway and Mask  Additional Equipment: None  Intra-op Plan:   Post-operative Plan: Extubation in OR  Informed Consent: I have reviewed the patients History and Physical, chart, labs and discussed the procedure including the risks, benefits and alternatives for the proposed anesthesia with the patient or authorized representative who has indicated his/her understanding and acceptance.     Dental advisory given  Plan Discussed with: Anesthesiologist and CRNA  Anesthesia Plan Comments:        Anesthesia Quick Evaluation

## 2023-02-25 NOTE — CV Procedure (Signed)
Procedure:   DCCV  Indication:  Symptomatic atrial fibrillation  Procedure Note:  The patient signed informed consent.  They have had had therapeutic anticoagulation with apixaban greater than 3 weeks.  Anesthesia was administered by Dr. Malen Gauze.  Patient received 50 mg IV lidocaine and 50 mg IV propofol. Adequate airway was maintained throughout and vital followed per protocol.  They were cardioverted x 1 with 150J of biphasic synchronized energy.  They converted to sinus bradycardia.  There were no apparent complications.  The patient had normal neuro status and respiratory status post procedure with vitals stable as recorded elsewhere.    Follow up:  They will continue on current medical therapy and follow up with cardiology as scheduled.  Jodelle Red, MD PhD 02/25/2023 12:22 PM

## 2023-02-25 NOTE — Anesthesia Postprocedure Evaluation (Signed)
Anesthesia Post Note  Patient: Kristina [name]  Procedure(s) Performed: CARDIOVERSION     Patient location during evaluation: PACU Anesthesia Type: General Level of consciousness: awake and alert and oriented Pain management: pain level controlled Vital Signs Assessment: post-procedure vital signs reviewed and stable Respiratory status: spontaneous breathing, nonlabored ventilation and respiratory function stable Cardiovascular status: blood pressure returned to baseline and stable Postop Assessment: no apparent nausea or vomiting Anesthetic complications: no   No notable events documented.  Last Vitals:  Vitals:   02/25/23 1234 02/25/23 1244  BP: (!) 101/53 (!) 127/45  Pulse: (!) 44 (!) 50  Resp: 16 15  Temp:    SpO2: 95% 96%    Last Pain:  Vitals:   02/25/23 1244  TempSrc:   PainSc: 0-No pain                 Kenzo Ozment A.

## 2023-02-25 NOTE — Interval H&P Note (Signed)
History and Physical Interval Note:  02/25/2023 12:05 PM  Kristina [name]  has presented today for surgery, with the diagnosis of PERSISTENT AFIB.  The various methods of treatment have been discussed with the patient and family. After consideration of risks, benefits and other options for treatment, the patient has consented to  Procedure(s): CARDIOVERSION (N/A) as a surgical intervention.  [name] history has been reviewed, patient examined, no change in status, stable for surgery.  I have reviewed the patient's chart and labs.  Questions were answered to the patient's satisfaction.     Ayano Douthitt Cristal Deer

## 2023-02-25 NOTE — Discharge Instructions (Signed)

## 2023-02-26 ENCOUNTER — Encounter (HOSPITAL_COMMUNITY): Payer: Self-pay | Admitting: Cardiology

## 2023-02-26 ENCOUNTER — Telehealth: Payer: Self-pay | Admitting: Cardiology

## 2023-02-26 NOTE — Telephone Encounter (Signed)
Patient would like a call back to discuss how she feels after cardioversion she had yesterday. She states she feels winded and a bit weak. Requesting call back.

## 2023-02-26 NOTE — Telephone Encounter (Signed)
Patient called she was cardioverted on 5/23, prior to the procedure, pt stated she was                         " short winded" . As of today pt stated she feels a little better however, still feeling "short winded" . Pt stated this symptom as not worsen, she thought this symptom would resolve after the procedure. She denies any other symptoms. Explained ED precautions, patient voiced understanding. Will forward to MD and nurse for advise.

## 2023-02-26 NOTE — Telephone Encounter (Signed)
Spoke to the patient explained Dr. Allyson Sabal recommendation:   It may take a while for her symptoms to improve after cardioversion.    Patient voiced understanding.

## 2023-02-28 ENCOUNTER — Emergency Department (HOSPITAL_COMMUNITY)
Admission: EM | Admit: 2023-02-28 | Discharge: 2023-02-28 | Disposition: A | Discharge status: Home / Self Care | Payer: Medicare Other | Attending: Emergency Medicine | Admitting: Emergency Medicine

## 2023-02-28 ENCOUNTER — Telehealth: Payer: Self-pay

## 2023-02-28 ENCOUNTER — Emergency Department (HOSPITAL_COMMUNITY): Payer: Medicare Other

## 2023-02-28 ENCOUNTER — Encounter (HOSPITAL_COMMUNITY): Payer: Self-pay

## 2023-02-28 ENCOUNTER — Telehealth: Payer: Self-pay | Admitting: Physician Assistant

## 2023-02-28 DIAGNOSIS — R0789 Other chest pain: Secondary | ICD-10-CM | POA: Diagnosis not present

## 2023-02-28 DIAGNOSIS — I4891 Unspecified atrial fibrillation: Secondary | ICD-10-CM | POA: Diagnosis not present

## 2023-02-28 DIAGNOSIS — Z7901 Long term (current) use of anticoagulants: Secondary | ICD-10-CM | POA: Diagnosis not present

## 2023-02-28 DIAGNOSIS — I480 Paroxysmal atrial fibrillation: Secondary | ICD-10-CM | POA: Insufficient documentation

## 2023-02-28 DIAGNOSIS — R0602 Shortness of breath: Secondary | ICD-10-CM | POA: Diagnosis not present

## 2023-02-28 LAB — CBC
HCT: 37.3 % (ref 36.0–46.0)
Hemoglobin: 11.2 g/dL — ABNORMAL LOW (ref 12.0–15.0)
MCH: 19.7 pg — ABNORMAL LOW (ref 26.0–34.0)
MCHC: 30 g/dL (ref 30.0–36.0)
MCV: 65.7 fL — ABNORMAL LOW (ref 80.0–100.0)
Platelets: 256 10*3/uL (ref 150–400)
RBC: 5.68 MIL/uL — ABNORMAL HIGH (ref 3.87–5.11)
RDW: 17.8 % — ABNORMAL HIGH (ref 11.5–15.5)
WBC: 7 10*3/uL (ref 4.0–10.5)
nRBC: 0 % (ref 0.0–0.2)

## 2023-02-28 LAB — BASIC METABOLIC PANEL
Anion gap: 10 (ref 5–15)
BUN: 13 mg/dL (ref 8–23)
CO2: 24 mmol/L (ref 22–32)
Calcium: 8.9 mg/dL (ref 8.9–10.3)
Chloride: 105 mmol/L (ref 98–111)
Creatinine, Ser: 0.62 mg/dL (ref 0.44–1.00)
GFR, Estimated: >60 mL/min (ref >60)
Glucose, Bld: 96 mg/dL (ref 70–99)
Potassium: 3.9 mmol/L (ref 3.5–5.1)
Sodium: 139 mmol/L (ref 135–145)

## 2023-02-28 NOTE — ED Triage Notes (Signed)
Pt had a cardiac conversion on 5/23. Pt states that she went out of a fib. However, she has gone back in to a fib and per her smart watch, rate has been increasing.   Pt has been taken meds as prescribed

## 2023-02-28 NOTE — Telephone Encounter (Signed)
Called pt after pt notified case manager that her apple watch told her she was back in Afib. Case manager sent me a secure chat.   She is taking eliquis without missing doses. She has not checked her BP. She is wondering what to do.   She had a call coming in and wanted to take the call. I advised her to call back if she still needed cardiology guidance.    Roe Rutherford Dealva Lafoy, PA-C 02/28/2023, 4:57 PM [phone]

## 2023-02-28 NOTE — Telephone Encounter (Signed)
Patient was transferred to this Hospital Kristina [name], she is calling because her apple watch indicated she is in Atrial fibrillation a rate in the 80's. And she wanted to know what to do. She has a little SHOB on exertion, just got cardioverted on 5/24. She is on eliquis and sees Dr Allyson Sabal. Message sent to Dr Allyson Sabal regarding change. Discussed return to ED precautions with increased heart rate or symptoms

## 2023-02-28 NOTE — Telephone Encounter (Signed)
Paged Cards since no return message on secure chat at [phone], no response yet, in the meantime called Mrs Gubser to see how her heart rate was doing. She indicates she is Short of breath and her heart rate has increased to the 90's/ She said she will come in to the ED and get checked out.

## 2023-02-28 NOTE — ED Provider Notes (Signed)
[place] EMERGENCY DEPARTMENT AT [facility] Provider Note   CSN: [id number] Arrival date & time: 02/28/23  1804     History  Chief Complaint  Patient presents with   Atrial Fibrillation    Kristina [name] is a 77 y.o. female.   Atrial Fibrillation  Patient with history of atrial fibrillation.  Cardioverted 3 days ago.  States at noon today she checked her watch and found out she was in A-fib.  Does not feel short of breath.  No chest pain.  No trouble breathing.  Claims compliant with her medication.  Is on anticoagulation.     Home Medications Prior to Admission medications   Medication Sig Start Date End Date Taking? Authorizing Provider  acetaminophen (TYLENOL) 325 MG tablet Take 1-2 tablets (325-650 mg total) by mouth every 6 (six) hours as needed for mild pain (or temp >/= 101 F). 09/28/19   Setzer, Lynnell Jude, PA-C  apixaban (ELIQUIS) 5 MG TABS tablet Take 1 tablet (5 mg total) by mouth 2 (two) times daily. Pt. Was given samples from her PCP enough to last one month 02/18/23   Joylene Grapes, NP  atorvastatin (LIPITOR) 20 MG tablet Take 20 mg by mouth every morning. 12/12/21   [provider]  cyanocobalamin (VITAMIN B12) 1000 MCG tablet Take 1,000 mcg by mouth daily.    [provider]  dorzolamide-timolol (COSOPT) 22.3-6.8 MG/ML ophthalmic solution Place 1 drop into both eyes 2 (two) times daily. 10/17/19   Rennis Chris, MD  ELDERBERRY PO Take 2 tablets by mouth daily. Airborne Immune gummy    [provider]  escitalopram (LEXAPRO) 10 MG tablet Take 10 mg by mouth daily.    [provider]  ferrous sulfate 325 (65 FE) MG EC tablet Take 325 mg by mouth daily.    [provider]  GLUCOSAMINE-CHONDROITIN PO Take 1,500 mg by mouth daily.    [provider]  hydrochlorothiazide (HYDRODIURIL) 25 MG tablet Take 25 mg by mouth daily.     [provider]  ibuprofen (ADVIL) 600 MG tablet Take 1 tablet (600  mg total) by mouth every 6 (six) hours as needed. 04/15/22   Gerald Leitz, MD  losartan (COZAAR) 100 MG tablet Take 100 mg by mouth daily.    [provider]  metoprolol succinate (TOPROL-XL) 50 MG 24 hr tablet Take 1 tablet (50 mg total) by mouth daily. 02/18/23   Joylene Grapes, NP  Multiple Vitamin (MULTIVITAMIN) capsule Take 1 capsule by mouth daily. Vita fusion    [provider]  Multiple Vitamins-Minerals (AIRBORNE GUMMIES PO) Take 3 tablets by mouth daily. immune system    [provider]  Vitamin D, Ergocalciferol, (DRISDOL) 1.25 MG (50000 UNIT) CAPS capsule Take 1 capsule (50,000 Units total) by mouth every 7 (seven) days. 12/31/21   Quillian Quince D, MD      Allergies    Penicillins and Mite (d. farinae)    Review of Systems   Review of Systems  Physical Exam Updated Vital Signs BP (!) 143/92 (BP Location: Right Arm)   Pulse 90   Temp 98.3 F (36.8 C)   Resp 18   Ht 5\' 3"  (1.6 m)   Wt 99.8 kg   SpO2 95%   BMI 38.97 kg/m  Physical Exam Vitals and nursing note reviewed.  HENT:     Head: Normocephalic.  Eyes:     Pupils: Pupils are equal, round, and reactive to light.  Cardiovascular:  Rate and Rhythm: Normal rate. Rhythm irregular.  Abdominal:     Tenderness: There is no abdominal tenderness.  Musculoskeletal:        General: No tenderness.     Right lower leg: No edema.     Left lower leg: No edema.  Skin:    Capillary Refill: Capillary refill takes less than 2 seconds.  Neurological:     Mental Status: She is alert and oriented to person, place, and time.     ED Results / Procedures / Treatments   Labs (all labs ordered are listed, but only abnormal results are displayed) Labs Reviewed  CBC - Abnormal; Notable for the following components:      Result Value   RBC 5.68 (*)    Hemoglobin 11.2 (*)    MCV 65.7 (*)    MCH 19.7 (*)    RDW 17.8 (*)    All other components within normal limits  BASIC METABOLIC PANEL    EKG EKG  Interpretation  Date/Time:  Sunday Feb 28 2023 18:17:11 EDT Ventricular Rate:  101 PR Interval:    QRS Duration: 84 QT Interval:  316 QTC Calculation: 409 R Axis:   46 Text Interpretation: Atrial fibrillation with rapid ventricular response Abnormal ECG When compared with ECG of 25-Feb-2023 12:26,  atrial flutter is new Confirmed by Benjiman Core [phone]) on 02/28/2023 6:52:51 PM  Radiology DG Chest 1 View  Result Date: 02/28/2023 CLINICAL DATA:  Atrial fibrillation. Shortness of breath and chest tightness. EXAM: CHEST  1 VIEW COMPARISON:  None Available. FINDINGS: Cardiac silhouette and mediastinal contours within normal limits. Mild calcification within aortic arch. The lungs are clear. Minimal blunting of the bilateral costophrenic angles, age indeterminate and possibly from chronic scarring versus trace pleural fluid. No pneumothorax. No acute skeletal abnormality. IMPRESSION: Minimal blunting of the bilateral costophrenic angles, possibly from chronic scarring versus trace pleural fluid. The lungs are clear. Electronically Signed   By: Neita Garnet M.D.   On: 02/28/2023 18:55    Procedures Procedures    Medications Ordered in ED Medications - No data to display  ED Course/ Medical Decision Making/ A&P                             Medical Decision Making Amount and/or Complexity of Data Reviewed Labs: ordered.   Patient with A-fib.  History of same.  Cardioverted 3 days ago.  Appears to be in A-fib now but rate is controlled.  Blood work reassuring.  Chest x-ray usual reassuring.  Reviewed cardiology note.  Discussed with Dr. Orson Aloe from cardiology.  Feels outpatient follow-up is appropriate with no adjustment of medications.  She may need alternate antiarrhythmics.  However appears stable for discharge at this time.        Final Clinical Impression(s) / ED Diagnoses Final diagnoses:  Paroxysmal atrial fibrillation (HCC)    Rx / DC Orders ED Discharge Orders           Ordered    Ambulatory referral to Cardiology       Comments: If you have not heard from the Cardiology office within the next 72 hours please call [phone].   02/28/23 2118              Benjiman Core, MD 02/28/23 2118

## 2023-03-02 ENCOUNTER — Other Ambulatory Visit: Payer: Self-pay

## 2023-03-02 DIAGNOSIS — I480 Paroxysmal atrial fibrillation: Secondary | ICD-10-CM

## 2023-03-02 NOTE — Telephone Encounter (Signed)
Spoke with pt. We are cancelling her apt with [name] NP tomorrow 03/03/23 and send an urgent referral to the AFIB clinic ok per [name] NP. Pt verbalized understanding and will call back if she has any questions or concerns.

## 2023-03-02 NOTE — Addendum Note (Signed)
Addended by: Lamar Benes on: 03/02/2023 01:31 PM   Modules accepted: Orders

## 2023-03-03 ENCOUNTER — Ambulatory Visit: Payer: Medicare Other | Admitting: Nurse Practitioner

## 2023-03-09 ENCOUNTER — Ambulatory Visit (HOSPITAL_COMMUNITY)
Admission: RE | Admit: 2023-03-09 | Discharge: 2023-03-09 | Disposition: A | Discharge status: Home / Self Care | Payer: Medicare Other | Source: Ambulatory Visit | Attending: Internal Medicine | Admitting: Internal Medicine

## 2023-03-09 VITALS — BP 152/92 | HR 88 | Ht 63.0 in | Wt 239.8 lb

## 2023-03-09 DIAGNOSIS — E669 Obesity, unspecified: Secondary | ICD-10-CM | POA: Diagnosis not present

## 2023-03-09 DIAGNOSIS — G4733 Obstructive sleep apnea (adult) (pediatric): Secondary | ICD-10-CM | POA: Insufficient documentation

## 2023-03-09 DIAGNOSIS — I4819 Other persistent atrial fibrillation: Secondary | ICD-10-CM | POA: Insufficient documentation

## 2023-03-09 DIAGNOSIS — D6869 Other thrombophilia: Secondary | ICD-10-CM

## 2023-03-09 DIAGNOSIS — Z7901 Long term (current) use of anticoagulants: Secondary | ICD-10-CM | POA: Diagnosis not present

## 2023-03-09 DIAGNOSIS — Z713 Dietary counseling and surveillance: Secondary | ICD-10-CM | POA: Diagnosis not present

## 2023-03-09 DIAGNOSIS — Z79899 Other long term (current) drug therapy: Secondary | ICD-10-CM | POA: Insufficient documentation

## 2023-03-09 DIAGNOSIS — E785 Hyperlipidemia, unspecified: Secondary | ICD-10-CM | POA: Insufficient documentation

## 2023-03-09 DIAGNOSIS — Z6841 Body Mass Index (BMI) 40.0 and over, adult: Secondary | ICD-10-CM | POA: Insufficient documentation

## 2023-03-09 NOTE — Progress Notes (Signed)
Primary Care Physician: Sigmund Hazel, MD Primary Cardiologist: Dr. Allyson Sabal Primary Electrophysiologist: None Referring Physician: [name], NP   Kristina [name] is a 77 y.o. female with a history of aortic stenosis, aortic atherosclerosis, carotid artery disease, HTN, HLD, OSA no longer on CPAP, and persistent atrial fibrillation who presents for consultation in the [facility]. She underwent successful DCCV on 02/25/23 with ERAF several days later. Patient went to ED on 5/26 due to Afib with HR 101. Patient is on Eliquis 5 mg BID for a CHADS2VASC score of 5.  On evaluation today, she is currently in rate controlled Afib. She feels tired and short of breath when in Afib. She has been compliant with Toprol and notes her HR to always be below 100 bpm. She had a sleep study years ago but never used CPAP therapy.  She is compliant with anticoagulation and has not missed any doses. She has no bleeding concerns.  Today, she denies symptoms of palpitations, chest pain, orthopnea, PND, lower extremity edema, dizziness, presyncope, syncope, snoring, daytime somnolence, bleeding, or neurologic sequela. The patient is tolerating medications without difficulties and is otherwise without complaint today.    she has a BMI of Body mass index is 42.48 kg/m.[name] Filed Weights   03/09/23 1426  Weight: 108.8 kg    Family History  Problem Relation Age of Onset   Kidney disease Mother    Hypertension Mother    Glaucoma Mother    Heart failure Father    Emphysema Father    Asthma Father    Hypertension Father    Heart disease Father    COPD Brother    Allergic rhinitis Neg Hx    Angioedema Neg Hx    Atopy Neg Hx    Eczema Neg Hx    Immunodeficiency Neg Hx    Urticaria Neg Hx     Atrial Fibrillation Management history:  Previous antiarrhythmic drugs: None Previous cardioversions: 02/25/23 Previous ablations: None Anticoagulation history: Eliquis 5 mg  BID   Past Medical History:  Diagnosis Date   Allergic rhinitis    Anemia glaucoma   Anxiety    Back pain    Cataract    Mixed OU   DDD (degenerative disc disease), lumbar    High blood pressure    High cholesterol    History of stomach ulcers    Hypertensive retinopathy    OU   Rheumatic fever    Rheumatic fever    Rheumatoid arteritis (HCC)    Past Surgical History:  Procedure Laterality Date   APPENDECTOMY  1955   CARDIOVERSION N/A 02/25/2023   Procedure: CARDIOVERSION;  Surgeon: Jodelle Red, MD;  Location: [facility] INVASIVE CV LAB;  Service: Cardiovascular;  Laterality: N/A;   CHOLECYSTECTOMY  1963   DILATATION & CURETTAGE/HYSTEROSCOPY WITH MYOSURE N/A 04/15/2022   Procedure: DILATATION & CURETTAGE/HYSTEROSCOPY WITH MYOSURE;  Surgeon: Gerald Leitz, MD;  Location: [facility] OR;  Service: Gynecology;  Laterality: N/A;   ENDARTERECTOMY Left 09/27/2019   Procedure: ENDARTERECTOMY CAROTID LEFT;  Surgeon: Nada Libman, MD;  Location: [facility] OR;  Service: Vascular;  Laterality: Left;   HERNIA REPAIR  2007   HERNIA REPAIR  2015   PATCH ANGIOPLASTY Left 09/27/2019   Procedure: Patch Angioplasty Using Livia Snellen;  Surgeon: Nada Libman, MD;  Location: [facility] OR;  Service: Vascular;  Laterality: Left;   REPLACEMENT TOTAL KNEE  2016   TONSILLECTOMY      Current Outpatient Medications  Medication Sig Dispense Refill  acetaminophen (TYLENOL) 325 MG tablet Take 1-2 tablets (325-650 mg total) by mouth every 6 (six) hours as needed for mild pain (or temp >/= 101 F).     apixaban (ELIQUIS) 5 MG TABS tablet Take 1 tablet (5 mg total) by mouth 2 (two) times daily. Pt. Was given samples from her PCP enough to last one month 180 tablet 3   atorvastatin (LIPITOR) 20 MG tablet Take 20 mg by mouth every morning.     cyanocobalamin (VITAMIN B12) 1000 MCG tablet Take 1,000 mcg by mouth daily.     dorzolamide-timolol (COSOPT) 22.3-6.8 MG/ML ophthalmic solution Place 1 drop into both eyes 2 (two) times  daily. 10 mL 4   ELDERBERRY PO Take 2 tablets by mouth daily. Airborne Immune gummy     escitalopram (LEXAPRO) 10 MG tablet Take 10 mg by mouth daily.     ferrous sulfate 325 (65 FE) MG EC tablet Take 325 mg by mouth daily.     GLUCOSAMINE-CHONDROITIN PO Take 1,500 mg by mouth daily.     hydrochlorothiazide (HYDRODIURIL) 25 MG tablet Take 25 mg by mouth daily.      ibuprofen (ADVIL) 600 MG tablet Take 1 tablet (600 mg total) by mouth every 6 (six) hours as needed. 30 tablet 0   losartan (COZAAR) 100 MG tablet Take 100 mg by mouth daily.     metoprolol succinate (TOPROL-XL) 50 MG 24 hr tablet Take 1 tablet (50 mg total) by mouth daily. 90 tablet 3   Multiple Vitamin (MULTIVITAMIN) capsule Take 1 capsule by mouth daily. Vita fusion     Multiple Vitamins-Minerals (AIRBORNE GUMMIES PO) Take 3 tablets by mouth daily. immune system     Vitamin D, Ergocalciferol, (DRISDOL) 1.25 MG (50000 UNIT) CAPS capsule Take 1 capsule (50,000 Units total) by mouth every 7 (seven) days. 4 capsule 0   No current facility-administered medications for this encounter.    Allergies  Allergen Reactions   Penicillins Anaphylaxis and Itching    Did it involve swelling of the face/tongue/throat, SOB, or low BP? Yes Did it involve sudden or severe rash/hives, skin peeling, or any reaction on the inside of your mouth or nose? No Did you need to seek medical attention at a hospital or doctor's office? Yes When did it last happen?      Childhood allergy If all above answers are "NO", may proceed with cephalosporin use.    Mite (D. 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  Tobacco Use   Smoking status: Former    Packs/day: 1.50     Years: 30.00    Additional pack years: 0.00    Total pack years: 45.00    Types: Cigarettes    Quit date: 2009    Years since quitting: 15.4   Smokeless tobacco: Never  Vaping Use   Vaping Use: Never used  Substance and Sexual Activity   Alcohol use: Yes    Comment: social   Drug use: Never   Sexual activity: Not on file  Other Topics Concern   Not on file  Social History Narrative   Not on file   Social Determinants of Health  Financial Resource Strain: Not on file  Food Insecurity: Not on file  Transportation Needs: Not on file  Physical Activity: Not on file  Stress: Not on file  Social Connections: Not on file  Intimate Partner Violence: Not on file    ROS- All systems are reviewed and negative except as per the HPI above.  Physical Exam: Vitals:   03/09/23 1426  BP: (!) 152/92  Pulse: 88  Weight: 108.8 kg  Height: 5\' 3"  (1.6 m)    GEN- The patient is a well appearing female, alert and oriented x 3 today.   Head- normocephalic, atraumatic Eyes-  Sclera clear, conjunctiva pink Ears- hearing intact Oropharynx- clear Neck- supple  Lungs- Clear to ausculation bilaterally, normal work of breathing Heart- Irregular rate and rhythm, no murmurs, rubs or gallops  GI- soft, NT, ND, + BS Extremities- no clubbing, cyanosis, or edema MS- no significant deformity or atrophy Skin- no rash or lesion Psych- euthymic mood, full affect Neuro- strength and sensation are intact  Wt Readings from Last 3 Encounters:  03/09/23 108.8 kg  02/28/23 99.8 kg  02/25/23 99.8 kg    EKG today demonstrates  Vent. rate 88 BPM PR interval * ms QRS duration 82 ms QT/QTcB 380/459 ms P-R-T axes * 16 49 Atrial fibrillation Abnormal ECG When compared with ECG of 28-Feb-2023 18:17, PREVIOUS ECG IS PRESENT  Echo 10/23/22 demonstrated: 1. Left ventricular ejection fraction, by estimation, is 60 to 65%. The  left ventricle has normal function. The left ventricle has no regional   wall motion abnormalities. There is mild left ventricular hypertrophy.  Left ventricular diastolic parameters  are consistent with Grade I diastolic dysfunction (impaired relaxation).  Elevated left ventricular end-diastolic pressure.   2. Right ventricular systolic function is normal. The right ventricular  size is normal.   3. Left atrial size was moderately dilated.   4. The mitral valve is abnormal. Mild mitral valve regurgitation. No  evidence of mitral stenosis. Moderate mitral annular calcification.   5. Gradients have increased since TTE done 09/08/21 . The aortic valve is  tricuspid. There is moderate calcification of the aortic valve. There is  moderate thickening of the aortic valve. Aortic valve regurgitation is not  visualized. Moderate aortic  valve stenosis.   6. The inferior vena cava is normal in size with greater than 50%  respiratory variability, suggesting right atrial pressure of 3 mmHg.   Epic records are reviewed at length today.  CHA2DS2-VASc Score = 5  [name] score is based upon: CHF History: 0 HTN History: 1 Diabetes History: 0 Stroke History: 0 Vascular Disease History: 1 Age Score: 2 Gender Score: 1       ASSESSMENT AND PLAN: Persistent Atrial Fibrillation (ICD10:  I48.19) [name] CHA2DS2-VASc score is 5, indicating a 7.2% annual risk of stroke.    Education provided about Afib. Discussion about medication treatments which includes Tikosyn or amiodarone. We also discussed ablation in detail but I did advise caution considering her BMI of 42 makes her less favorable as a candidate. Goal of 10% weight loss advised. After discussion, patient and daughter would like to discuss further and let us know what they decide. This is reasonable.  Continue Toprol and Eliquis.   If she chooses Tikosyn, she is not taking Lexapro. Her HCTZ would have to be changed.   Secondary Hypercoagulable State (ICD10:  D68.69) The patient is at significant  risk for stroke/thromboembolism based upon her CHA2DS2-VASc Score of 5.  Continue Apixaban (Eliquis).  No missed doses.  3. Obesity Body mass index is 42.48 kg/m. Lifestyle modification was discussed at length including regular exercise and weight reduction. Daily walking as tolerated.  4. Obstructive sleep apnea Sleep study ordered  Patient will call us with decision on how to proceed.   Lake Bells, PA-C Afib Clinic [facility] [address] [place], [place] [address] [phone] 03/09/2023 3:56 PM

## 2023-03-11 ENCOUNTER — Other Ambulatory Visit: Payer: Self-pay

## 2023-03-11 DIAGNOSIS — I6523 Occlusion and stenosis of bilateral carotid arteries: Secondary | ICD-10-CM

## 2023-03-15 DIAGNOSIS — D6869 Other thrombophilia: Secondary | ICD-10-CM | POA: Diagnosis not present

## 2023-03-15 DIAGNOSIS — I4819 Other persistent atrial fibrillation: Secondary | ICD-10-CM | POA: Diagnosis not present

## 2023-03-15 DIAGNOSIS — D508 Other iron deficiency anemias: Secondary | ICD-10-CM | POA: Diagnosis not present

## 2023-03-15 DIAGNOSIS — I700 Atherosclerosis of aorta: Secondary | ICD-10-CM | POA: Diagnosis not present

## 2023-03-18 ENCOUNTER — Ambulatory Visit: Payer: Medicare Other | Attending: Nurse Practitioner | Admitting: Nurse Practitioner

## 2023-03-18 ENCOUNTER — Encounter: Payer: Self-pay | Admitting: Nurse Practitioner

## 2023-03-18 VITALS — BP 124/82 | HR 83 | Ht 63.0 in | Wt 237.2 lb

## 2023-03-18 DIAGNOSIS — E785 Hyperlipidemia, unspecified: Secondary | ICD-10-CM

## 2023-03-18 DIAGNOSIS — I6523 Occlusion and stenosis of bilateral carotid arteries: Secondary | ICD-10-CM

## 2023-03-18 DIAGNOSIS — I4819 Other persistent atrial fibrillation: Secondary | ICD-10-CM

## 2023-03-18 DIAGNOSIS — I350 Nonrheumatic aortic (valve) stenosis: Secondary | ICD-10-CM

## 2023-03-18 DIAGNOSIS — G4733 Obstructive sleep apnea (adult) (pediatric): Secondary | ICD-10-CM

## 2023-03-18 DIAGNOSIS — I10 Essential (primary) hypertension: Secondary | ICD-10-CM | POA: Diagnosis not present

## 2023-03-18 MED ORDER — METOPROLOL SUCCINATE ER 50 MG PO TB24: 50.0000 mg | ORAL_TABLET | Freq: Every day | ORAL | 0 refills | Status: DC

## 2023-03-18 MED ORDER — FUROSEMIDE 40 MG PO TABS: 40.0000 mg | ORAL_TABLET | Freq: Every day | ORAL | 3 refills | Status: DC

## 2023-03-18 MED ORDER — METOPROLOL SUCCINATE ER 50 MG PO TB24: 100.0000 mg | ORAL_TABLET | Freq: Every day | ORAL | 0 refills | Status: DC

## 2023-03-18 NOTE — Progress Notes (Signed)
Office Visit    Patient Name: Kristina [name] Date of Encounter: 03/18/2023  Primary Care Provider:  Sigmund Hazel, MD Primary Cardiologist:  Nanetta Batty, MD  Chief Complaint    77 year old female with a history of paroxysmal atrial fibrillation, aortic stenosis, carotid artery disease, hypertension, and hyperlipidemia who presents for follow-up related to atrial fibrillation.   Past Medical History    Past Medical History:  Diagnosis Date   Allergic rhinitis    Anemia glaucoma   Anxiety    Back pain    Cataract    Mixed OU   DDD (degenerative disc disease), lumbar    High blood pressure    High cholesterol    History of stomach ulcers    Hypertensive retinopathy    OU   Rheumatic fever    Rheumatic fever    Rheumatoid arteritis (HCC)    Past Surgical History:  Procedure Laterality Date   APPENDECTOMY  1955   CARDIOVERSION N/A 02/25/2023   Procedure: CARDIOVERSION;  Surgeon: Jodelle Red, MD;  Location: [facility] INVASIVE CV LAB;  Service: Cardiovascular;  Laterality: N/A;   CHOLECYSTECTOMY  1963   DILATATION & CURETTAGE/HYSTEROSCOPY WITH MYOSURE N/A 04/15/2022   Procedure: DILATATION & CURETTAGE/HYSTEROSCOPY WITH MYOSURE;  Surgeon: Gerald Leitz, MD;  Location: [facility] OR;  Service: Gynecology;  Laterality: N/A;   ENDARTERECTOMY Left 09/27/2019   Procedure: ENDARTERECTOMY CAROTID LEFT;  Surgeon: Nada Libman, MD;  Location: [facility] OR;  Service: Vascular;  Laterality: Left;   HERNIA REPAIR  2007   HERNIA REPAIR  2015   PATCH ANGIOPLASTY Left 09/27/2019   Procedure: Patch Angioplasty Using Livia Snellen;  Surgeon: Nada Libman, MD;  Location: [facility] OR;  Service: Vascular;  Laterality: Left;   REPLACEMENT TOTAL KNEE  2016   TONSILLECTOMY      Allergies  Allergies  Allergen Reactions   Penicillins Anaphylaxis and Itching    Did it involve swelling of the face/tongue/throat, SOB, or low BP? Yes Did it involve sudden or severe rash/hives, skin peeling, or any reaction  on the inside of your mouth or nose? No Did you need to seek medical attention at a hospital or doctor's office? Yes When did it last happen?      Childhood allergy If all above answers are "NO", may proceed with cephalosporin use.    Mite (D. Farinae)     Other reaction(s): sinus swelling     Labs/Other Studies Reviewed    The following studies were reviewed today:  Cardiac Studies & Procedures       ECHOCARDIOGRAM  ECHOCARDIOGRAM COMPLETE 10/23/2022  Narrative ECHOCARDIOGRAM REPORT    Patient Name:   Kristina [name] Date of Exam: 10/23/2022 Medical Rec #:  [id number]         Height:       63.0 in Accession #:    [id number]        Weight:       235.2 lb Date of Birth:  1945/12/07          BSA:          2.072 m Patient Age:    76 years          BP:           138/84 mmHg Patient Gender: F                 HR:           66 bpm. Exam Location:  [address]  Procedure: 2D Echo, Cardiac Doppler  and Color Doppler  Indications:    I35.0 AS  History:        Patient has prior history of Echocardiogram examinations, most recent 09/08/2021. AS; Risk Factors:Hypertension, Dyslipidemia and Former Smoker.  Sonographer:    Samule Ohm RDCS Referring Phys: [phone] JONATHAN J BERRY  IMPRESSIONS   1. Left ventricular ejection fraction, by estimation, is 60 to 65%. The left ventricle has normal function. The left ventricle has no regional wall motion abnormalities. There is mild left ventricular hypertrophy. Left ventricular diastolic parameters are consistent with Grade I diastolic dysfunction (impaired relaxation). Elevated left ventricular end-diastolic pressure. 2. Right ventricular systolic function is normal. The right ventricular size is normal. 3. Left atrial size was moderately dilated. 4. The mitral valve is abnormal. Mild mitral valve regurgitation. No evidence of mitral stenosis. Moderate mitral annular calcification. 5. Gradients have increased since TTE done 09/08/21 .  The aortic valve is tricuspid. There is moderate calcification of the aortic valve. There is moderate thickening of the aortic valve. Aortic valve regurgitation is not visualized. Moderate aortic valve stenosis. 6. The inferior vena cava is normal in size with greater than 50% respiratory variability, suggesting right atrial pressure of 3 mmHg.  FINDINGS Left Ventricle: Left ventricular ejection fraction, by estimation, is 60 to 65%. The left ventricle has normal function. The left ventricle has no regional wall motion abnormalities. The left ventricular internal cavity size was normal in size. There is mild left ventricular hypertrophy. Left ventricular diastolic parameters are consistent with Grade I diastolic dysfunction (impaired relaxation). Elevated left ventricular end-diastolic pressure.  Right Ventricle: The right ventricular size is normal. No increase in right ventricular wall thickness. Right ventricular systolic function is normal.  Left Atrium: Left atrial size was moderately dilated.  Right Atrium: Right atrial size was normal in size.  Pericardium: There is no evidence of pericardial effusion.  Mitral Valve: The mitral valve is abnormal. There is mild thickening of the mitral valve leaflet(s). There is mild calcification of the mitral valve leaflet(s). Moderate mitral annular calcification. Mild mitral valve regurgitation. No evidence of mitral valve stenosis.  Tricuspid Valve: The tricuspid valve is normal in structure. Tricuspid valve regurgitation is mild . No evidence of tricuspid stenosis.  Aortic Valve: Gradients have increased since TTE done 09/08/21. The aortic valve is tricuspid. There is moderate calcification of the aortic valve. There is moderate thickening of the aortic valve. Aortic valve regurgitation is not visualized. Moderate aortic stenosis is present. Aortic valve mean gradient measures 19.0 mmHg. Aortic valve peak gradient measures 35.1 mmHg. Aortic valve  area, by VTI measures 1.22 cm.  Pulmonic Valve: The pulmonic valve was normal in structure. Pulmonic valve regurgitation is not visualized. No evidence of pulmonic stenosis.  Aorta: The aortic root is normal in size and structure.  Venous: The inferior vena cava is normal in size with greater than 50% respiratory variability, suggesting right atrial pressure of 3 mmHg.  IAS/Shunts: No atrial level shunt detected by color flow Doppler.   LEFT VENTRICLE PLAX 2D LVIDd:         5.30 cm   Diastology LVIDs:         3.50 cm   LV e' medial:    7.29 cm/s LV PW:         1.30 cm   LV E/e' medial:  16.3 LV IVS:        1.40 cm   LV e' lateral:   6.74 cm/s LVOT diam:     2.00 cm  LV E/e' lateral: 17.7 LV SV:         96 LV SV Index:   46 LVOT Area:     3.14 cm   RIGHT VENTRICLE             IVC RV S prime:     15.60 cm/s  IVC diam: 1.10 cm TAPSE (M-mode): 2.3 cm RVSP:           28.6 mmHg  LEFT ATRIUM             Index        RIGHT ATRIUM           Index LA diam:        4.30 cm 2.08 cm/m   RA Pressure: 3.00 mmHg LA Vol (A2C):   93.8 ml 45.28 ml/m  RA Area:     12.10 cm LA Vol (A4C):   71.8 ml 34.66 ml/m  RA Volume:   29.10 ml  14.05 ml/m LA Biplane Vol: 88.5 ml 42.72 ml/m AORTIC VALVE AV Area (Vmax):    1.24 cm AV Area (Vmean):   1.19 cm AV Area (VTI):     1.22 cm AV Vmax:           296.33 cm/s AV Vmean:          202.333 cm/s AV VTI:            0.785 m AV Peak Grad:      35.1 mmHg AV Mean Grad:      19.0 mmHg LVOT Vmax:         117.00 cm/s LVOT Vmean:        76.600 cm/s LVOT VTI:          0.306 m LVOT/AV VTI ratio: 0.39  AORTA Ao Root diam: 3.00 cm Ao Asc diam:  3.30 cm  MITRAL VALVE                TRICUSPID VALVE MV Area (PHT): 2.94 cm     TR Peak grad:   25.6 mmHg MV Decel Time: 258 msec     TR Vmax:        253.00 cm/s MV E velocity: 119.00 cm/s  Estimated RAP:  3.00 mmHg MV A velocity: 112.00 cm/s  RVSP:           28.6 mmHg MV E/A ratio:  1.06 SHUNTS Systemic  VTI:  0.31 m Systemic Diam: 2.00 cm  Charlton Haws MD Electronically signed by Charlton Haws MD Signature Date/Time: 10/23/2022/11:03:55 AM    Final            Recent Labs: 02/03/2023: Magnesium 2.2 02/28/2023: BUN 13; Creatinine, Ser 0.62; Hemoglobin 11.2; Platelets 256; Potassium 3.9; Sodium 139  Recent Lipid Panel    Component Value Date/Time   CHOL 169 09/23/2021 0858   TRIG 102 09/23/2021 0858   HDL 66 09/23/2021 0858   CHOLHDL 2.5 12/22/2019 1128   LDLCALC 85 09/23/2021 0858    History of Present Illness    77 year old female with the above past medical history including paroxysmal atrial fibrillation, aortic stenosis, carotid artery disease, hypertension, and hyperlipidemia.   Echocardiogram in 09/2021 showed normal LV systolic function, G1 DD, mild aortic stenosis.  Carotid Dopplers at the time revealed moderate R ICA and high-grade LICA stenosis.  She was referred to vascular surgery.  Repeat echocardiogram in 10/2022 showed EF 60 to 65%, normal LV function, no RWMA, mild LVH, G1 DD, normal RV systolic function, mild mitral valve regurgitation,  moderate aortic stenosis, mean gradient 19 mmHg, increased from prior.  Repeat echocardiogram was recommended in 1 year.  Most recent carotid ultrasound in 10/2022 showed 80 to 99% R ICA stenosis, 1 to 39% LICA stenosis.  She was asymptomatic.  Follow-up CTA of the head and neck was recommended in 6 months.  She saw her PCP in 01/2023 who identified new atrial fibrillation with RVR. She was started on Eliquis and metoprolol and advised to follow-up with cardiology as an outpatient.  Labs including BMET, CBC, and TSH were unremarkable.  She underwent successful DCCV on 02/25/2023 with restoration of sinus rhythm.  Unfortunately, days later she had an early recurrence of atrial fibrillation, which prompted ED visit.  She was referred to the A-fib clinic and was last seen on 03/09/2023.  Treatment options including Tikosyn and amiodarone were discussed.   Sleep study was ordered and is pending.   She presents today for follow-up.  Her daughter is present by phone.  Since her last visit she has been stable overall from a cardiac standpoint.  Following her cardioversion she noted increased shortness of breath.  Her heart rate was elevated, she also noted increased bilateral lower extremity edema. Her PCP increased her metoprolol to 100 mg daily.  Her heart rate has improved and with this her symptoms of shortness of breath have is also improved.  She does have ongoing bilateral lower extremity edema.  She continues to note some mild dyspnea on exertion.  She denies PND, orthopnea, weight gain.   Home Medications    Current Outpatient Medications  Medication Sig Dispense Refill   acetaminophen (TYLENOL) 325 MG tablet Take 1-2 tablets (325-650 mg total) by mouth every 6 (six) hours as needed for mild pain (or temp >/= 101 F).     apixaban (ELIQUIS) 5 MG TABS tablet Take 1 tablet (5 mg total) by mouth 2 (two) times daily. Pt. Was given samples from her PCP enough to last one month 180 tablet 3   atorvastatin (LIPITOR) 20 MG tablet Take 20 mg by mouth every morning.     cyanocobalamin (VITAMIN B12) 1000 MCG tablet Take 1,000 mcg by mouth daily.     dorzolamide-timolol (COSOPT) 22.3-6.8 MG/ML ophthalmic solution Place 1 drop into both eyes 2 (two) times daily. 10 mL 4   ELDERBERRY PO Take 2 tablets by mouth daily. Airborne Immune gummy     escitalopram (LEXAPRO) 10 MG tablet Take 10 mg by mouth daily.     ferrous sulfate 325 (65 FE) MG EC tablet Take 325 mg by mouth daily.     furosemide (LASIX) 40 MG tablet Take 1 tablet (40 mg total) by mouth daily. 90 tablet 3   GLUCOSAMINE-CHONDROITIN PO Take 1,500 mg by mouth daily.     hydrochlorothiazide (HYDRODIURIL) 25 MG tablet Take 25 mg by mouth daily.      ibuprofen (ADVIL) 600 MG tablet Take 1 tablet (600 mg total) by mouth every 6 (six) hours as needed. 30 tablet 0   losartan (COZAAR) 100 MG tablet Take  100 mg by mouth daily.     metoprolol succinate (TOPROL-XL) 100 MG 24 hr tablet Take 100 mg by mouth daily. Take with or immediately following a meal.     Multiple Vitamin (MULTIVITAMIN) capsule Take 1 capsule by mouth daily. Vita fusion     Multiple Vitamins-Minerals (AIRBORNE GUMMIES PO) Take 3 tablets by mouth daily. immune system     Vitamin D, Ergocalciferol, (DRISDOL) 1.25 MG (50000 UNIT) CAPS capsule Take 1  capsule (50,000 Units total) by mouth every 7 (seven) days. 4 capsule 0   metoprolol succinate (TOPROL-XL) 50 MG 24 hr tablet Take 1 tablet (50 mg total) by mouth daily. 90 tablet 0   No current facility-administered medications for this visit.     Review of Systems    She denies chest pain, palpitations, pnd, orthopnea, n, v, dizziness, syncope, weight gain, or early satiety. All other systems reviewed and are otherwise negative except as noted above.    Physical Exam    VS:  BP 124/82   Pulse 83   Ht 5\' 3"  (1.6 m)   Wt 237 lb 3.2 oz (107.6 kg)   SpO2 97%   BMI 42.02 kg/m . GEN: Well nourished, well developed, in no acute distress. HEENT: normal. Neck: Supple, no JVD, carotid bruits, or masses. Cardiac: RRR, no murmurs, rubs, or gallops. No clubbing, cyanosis, edema.  Radials/DP/PT 2+ and equal bilaterally.  Respiratory:  Respirations regular and unlabored, clear to auscultation bilaterally. GI: Soft, nontender, nondistended, BS + x 4. MS: no deformity or atrophy. Skin: warm and dry, no rash. Neuro:  Strength and sensation are intact. Psych: Normal affect.  Accessory Clinical Findings    ECG personally reviewed by me today -atrial fibrillation, 83 bpm- no acute changes.   Lab Results  Component Value Date   WBC 7.0 02/28/2023   HGB 11.2 (L) 02/28/2023   HCT 37.3 02/28/2023   MCV 65.7 (L) 02/28/2023   PLT 256 02/28/2023   Lab Results  Component Value Date   CREATININE 0.62 02/28/2023   BUN 13 02/28/2023   NA 139 02/28/2023   K 3.9 02/28/2023   CL 105  02/28/2023   CO2 24 02/28/2023   Lab Results  Component Value Date   ALT 33 (H) 01/27/2022   AST 29 01/27/2022   ALKPHOS 70 01/27/2022   BILITOT 0.6 01/27/2022   Lab Results  Component Value Date   CHOL 169 09/23/2021   HDL 66 09/23/2021   LDLCALC 85 09/23/2021   TRIG 102 09/23/2021   CHOLHDL 2.5 12/22/2019    Lab Results  Component Value Date   HGBA1C 5.6 01/27/2022    Assessment & Plan   1. Persistent atrial fibrillation/bilateral lower extremity edema/shortness of breath: S/p DCCV on 02/25/2023 with early recurrence of atrial fibrillation.   Following her cardioversion she noted increased shortness of breath, bilateral lower extremity edema.  Her symptoms worsened with recurrence of atrial fibrillation and improved with better heart rate control.  EKG today shows rate controlled A-fib.  She was seen in the A-fib clinic recently and was given the option of medical therapy with amiodarone or Tikosyn.  She is pending sleep study.   We had a long conversation today about ongoing management options. We discussed relationship to heart failure symptoms and uncontrolled atrial fibrillation. She is still weighing her options.  Given ongoing shortness of breath, bilateral lower extremity edema, will start Lasix 40 mg daily.  Will plan for close follow-up with repeat BMET at follow-up visit next week.  Discussed ongoing monitoring with daily weights, sodium and fluid recommendations.  Discussed potential triggers for A-fib.  She notes she has completely cut out caffeine.  Encouraged ongoing adherence to Eliquis.  Discussed ED precautions.  Continue metoprolol, Eliquis. She currently has a pill pack of metoprolol that has 50 mg tablets to last her for 90 days.  Will provide an additional 90-day supply of 50 mg tablets to meet the full dose of 100 mg daily.  Following the 90-day period we will transition to a 100 mg tablet daily.   2. Aortic stenosis: Most recent echo in 10/2022 showed EF 60 to 65%,  normal LV function, no RWMA, mild LVH, G1 DD, normal RV systolic function, mild mitral valve regurgitation, moderate aortic stenosis, mean gradient 19 mmHg, increased from prior.  Asymptomatic.  Plan for repeat echo in 10/2023.     3. Carotid artery disease: Most recent carotid ultrasound in 10/2022 showed 80 to 99% R ICA stenosis, 1 to 39% LICA stenosis. She was asymptomatic.  CT of the head and neck is scheduled for 04/2023. Following with vascular surgery.  Per Dr. Allyson Sabal, ok to D/C Aspirin given chronic DOAC therapy. Continue Lipitor.    4. Hypertension: BP well controlled. Continue current antihypertensive regimen.    5. Hyperlipidemia: LDL was 70 in 10/2022.  Continue Lipitor.   6. OSA: She has a history of sleep apnea, no longer on CPAP.  She is pending repeat sleep study per recommendation of A-fib clinic.   7. Disposition: Follow-up in 1 week.       Joylene Grapes, NP 03/18/2023, 12:50 PM

## 2023-03-18 NOTE — Patient Instructions (Addendum)
Medication Instructions:  START LASIX 40 MG DAILY TAKE TWO TABLETS OF THE METOPROLOL 50 MG DAILY *If you need a refill on your cardiac medications before your next appointment, please call your pharmacy*   Lab Work: NONE If you have labs (blood work) drawn today and your tests are completely normal, you will receive your results only by: MyChart Message (if you have MyChart) OR A paper copy in the mail If you have any lab test that is abnormal or we need to change your treatment, we will call you to review the results.   Testing/Procedures: NONE   Follow-Up: At [facility], you and your health needs are our priority.  As part of our continuing mission to provide you with exceptional heart care, we have created designated Provider Care Teams.  These Care Teams include your primary Cardiologist (physician) and Advanced Practice Providers (APPs -  Physician Assistants and Nurse Practitioners) who all work together to provide you with the care you need, when you need it.    Your next appointment:   HAS SCHEDULED AN APPOINTMENT TO SEE EMILY MONGE FRIDAY March 26, 2023  Provider:

## 2023-03-26 ENCOUNTER — Encounter: Payer: Self-pay | Admitting: Nurse Practitioner

## 2023-03-26 ENCOUNTER — Ambulatory Visit: Payer: Medicare Other | Attending: Nurse Practitioner | Admitting: Nurse Practitioner

## 2023-03-26 VITALS — BP 120/74 | HR 73 | Ht 63.0 in | Wt 229.2 lb

## 2023-03-26 DIAGNOSIS — I6523 Occlusion and stenosis of bilateral carotid arteries: Secondary | ICD-10-CM

## 2023-03-26 DIAGNOSIS — E785 Hyperlipidemia, unspecified: Secondary | ICD-10-CM | POA: Diagnosis not present

## 2023-03-26 DIAGNOSIS — G4733 Obstructive sleep apnea (adult) (pediatric): Secondary | ICD-10-CM

## 2023-03-26 DIAGNOSIS — I4819 Other persistent atrial fibrillation: Secondary | ICD-10-CM

## 2023-03-26 DIAGNOSIS — I350 Nonrheumatic aortic (valve) stenosis: Secondary | ICD-10-CM

## 2023-03-26 DIAGNOSIS — I10 Essential (primary) hypertension: Secondary | ICD-10-CM

## 2023-03-26 NOTE — Patient Instructions (Signed)
Medication Instructions:  Your physician recommends that you continue on your current medications as directed. Please refer to the Current Medication list given to you today.  *If you need a refill on your cardiac medications before your next appointment, please call your pharmacy*   Lab Work: BMET today  Testing/Procedures: NONE ordered at this time of appointment     Follow-Up: At [facility], you and your health needs are our priority.  As part of our continuing mission to provide you with exceptional heart care, we have created designated Provider Care Teams.  These Care Teams include your primary Cardiologist (physician) and Advanced Practice Providers (APPs -  Physician Assistants and Nurse Practitioners) who all work together to provide you with the care you need, when you need it.  We recommend signing up for the patient portal called "MyChart".  Sign up information is provided on this After Visit Summary.  MyChart is used to connect with patients for Virtual Visits (Telemedicine).  Patients are able to view lab/test results, encounter notes, upcoming appointments, etc.  Non-urgent messages can be sent to your provider as well.   To learn more about what you can do with MyChart, go to [email or web address].    Your next appointment:   3 month(s)  Provider:   [name], NP        Other Instructions Referral sent to EP

## 2023-03-26 NOTE — Progress Notes (Unsigned)
Office Visit    Patient Name: Kristina [name] Date of Encounter: 03/26/2023  Primary Care Provider:  Sigmund Hazel, MD Primary Cardiologist:  Nanetta Batty, MD  Chief Complaint    77 year old female with a history of paroxysmal atrial fibrillation, aortic stenosis, carotid artery disease, hypertension, and hyperlipidemia who presents for follow-up related to atrial fibrillation.    Past Medical History    Past Medical History:  Diagnosis Date   Allergic rhinitis    Anemia glaucoma   Anxiety    Back pain    Cataract    Mixed OU   DDD (degenerative disc disease), lumbar    High blood pressure    High cholesterol    History of stomach ulcers    Hypertensive retinopathy    OU   Rheumatic fever    Rheumatic fever    Rheumatoid arteritis (HCC)    Past Surgical History:  Procedure Laterality Date   APPENDECTOMY  1955   CARDIOVERSION N/A 02/25/2023   Procedure: CARDIOVERSION;  Surgeon: Jodelle Red, MD;  Location: [facility] INVASIVE CV LAB;  Service: Cardiovascular;  Laterality: N/A;   CHOLECYSTECTOMY  1963   DILATATION & CURETTAGE/HYSTEROSCOPY WITH MYOSURE N/A 04/15/2022   Procedure: DILATATION & CURETTAGE/HYSTEROSCOPY WITH MYOSURE;  Surgeon: Gerald Leitz, MD;  Location: [facility] OR;  Service: Gynecology;  Laterality: N/A;   ENDARTERECTOMY Left 09/27/2019   Procedure: ENDARTERECTOMY CAROTID LEFT;  Surgeon: Nada Libman, MD;  Location: [facility] OR;  Service: Vascular;  Laterality: Left;   HERNIA REPAIR  2007   HERNIA REPAIR  2015   PATCH ANGIOPLASTY Left 09/27/2019   Procedure: Patch Angioplasty Using Livia Snellen;  Surgeon: Nada Libman, MD;  Location: [facility] OR;  Service: Vascular;  Laterality: Left;   REPLACEMENT TOTAL KNEE  2016   TONSILLECTOMY      Allergies  Allergies  Allergen Reactions   Penicillins Anaphylaxis and Itching    Did it involve swelling of the face/tongue/throat, SOB, or low BP? Yes Did it involve sudden or severe rash/hives, skin peeling, or any reaction  on the inside of your mouth or nose? No Did you need to seek medical attention at a hospital or doctor's office? Yes When did it last happen?      Childhood allergy If all above answers are "NO", may proceed with cephalosporin use.    Mite (D. Farinae)     Other reaction(s): sinus swelling     Labs/Other Studies Reviewed    The following studies were reviewed today:  Cardiac Studies & Procedures       ECHOCARDIOGRAM  ECHOCARDIOGRAM COMPLETE 10/23/2022  Narrative ECHOCARDIOGRAM REPORT    Patient Name:   Kristina [name] Date of Exam: 10/23/2022 Medical Rec #:  [id number]         Height:       63.0 in Accession #:    [id number]        Weight:       235.2 lb Date of Birth:  Feb 26, 1946          BSA:          2.072 m Patient Age:    76 years          BP:           138/84 mmHg Patient Gender: F                 HR:           66 bpm. Exam Location:  [address]  Procedure: 2D Echo, Cardiac  Doppler and Color Doppler  Indications:    I35.0 AS  History:        Patient has prior history of Echocardiogram examinations, most recent 09/08/2021. AS; Risk Factors:Hypertension, Dyslipidemia and Former Smoker.  Sonographer:    Samule Ohm RDCS Referring Phys: [phone] JONATHAN J BERRY  IMPRESSIONS   1. Left ventricular ejection fraction, by estimation, is 60 to 65%. The left ventricle has normal function. The left ventricle has no regional wall motion abnormalities. There is mild left ventricular hypertrophy. Left ventricular diastolic parameters are consistent with Grade I diastolic dysfunction (impaired relaxation). Elevated left ventricular end-diastolic pressure. 2. Right ventricular systolic function is normal. The right ventricular size is normal. 3. Left atrial size was moderately dilated. 4. The mitral valve is abnormal. Mild mitral valve regurgitation. No evidence of mitral stenosis. Moderate mitral annular calcification. 5. Gradients have increased since TTE done 09/08/21 .  The aortic valve is tricuspid. There is moderate calcification of the aortic valve. There is moderate thickening of the aortic valve. Aortic valve regurgitation is not visualized. Moderate aortic valve stenosis. 6. The inferior vena cava is normal in size with greater than 50% respiratory variability, suggesting right atrial pressure of 3 mmHg.  FINDINGS Left Ventricle: Left ventricular ejection fraction, by estimation, is 60 to 65%. The left ventricle has normal function. The left ventricle has no regional wall motion abnormalities. The left ventricular internal cavity size was normal in size. There is mild left ventricular hypertrophy. Left ventricular diastolic parameters are consistent with Grade I diastolic dysfunction (impaired relaxation). Elevated left ventricular end-diastolic pressure.  Right Ventricle: The right ventricular size is normal. No increase in right ventricular wall thickness. Right ventricular systolic function is normal.  Left Atrium: Left atrial size was moderately dilated.  Right Atrium: Right atrial size was normal in size.  Pericardium: There is no evidence of pericardial effusion.  Mitral Valve: The mitral valve is abnormal. There is mild thickening of the mitral valve leaflet(s). There is mild calcification of the mitral valve leaflet(s). Moderate mitral annular calcification. Mild mitral valve regurgitation. No evidence of mitral valve stenosis.  Tricuspid Valve: The tricuspid valve is normal in structure. Tricuspid valve regurgitation is mild . No evidence of tricuspid stenosis.  Aortic Valve: Gradients have increased since TTE done 09/08/21. The aortic valve is tricuspid. There is moderate calcification of the aortic valve. There is moderate thickening of the aortic valve. Aortic valve regurgitation is not visualized. Moderate aortic stenosis is present. Aortic valve mean gradient measures 19.0 mmHg. Aortic valve peak gradient measures 35.1 mmHg. Aortic valve  area, by VTI measures 1.22 cm.  Pulmonic Valve: The pulmonic valve was normal in structure. Pulmonic valve regurgitation is not visualized. No evidence of pulmonic stenosis.  Aorta: The aortic root is normal in size and structure.  Venous: The inferior vena cava is normal in size with greater than 50% respiratory variability, suggesting right atrial pressure of 3 mmHg.  IAS/Shunts: No atrial level shunt detected by color flow Doppler.   LEFT VENTRICLE PLAX 2D LVIDd:         5.30 cm   Diastology LVIDs:         3.50 cm   LV e' medial:    7.29 cm/s LV PW:         1.30 cm   LV E/e' medial:  16.3 LV IVS:        1.40 cm   LV e' lateral:   6.74 cm/s LVOT diam:     2.00 cm  LV E/e' lateral: 17.7 LV SV:         96 LV SV Index:   46 LVOT Area:     3.14 cm   RIGHT VENTRICLE             IVC RV S prime:     15.60 cm/s  IVC diam: 1.10 cm TAPSE (M-mode): 2.3 cm RVSP:           28.6 mmHg  LEFT ATRIUM             Index        RIGHT ATRIUM           Index LA diam:        4.30 cm 2.08 cm/m   RA Pressure: 3.00 mmHg LA Vol (A2C):   93.8 ml 45.28 ml/m  RA Area:     12.10 cm LA Vol (A4C):   71.8 ml 34.66 ml/m  RA Volume:   29.10 ml  14.05 ml/m LA Biplane Vol: 88.5 ml 42.72 ml/m AORTIC VALVE AV Area (Vmax):    1.24 cm AV Area (Vmean):   1.19 cm AV Area (VTI):     1.22 cm AV Vmax:           296.33 cm/s AV Vmean:          202.333 cm/s AV VTI:            0.785 m AV Peak Grad:      35.1 mmHg AV Mean Grad:      19.0 mmHg LVOT Vmax:         117.00 cm/s LVOT Vmean:        76.600 cm/s LVOT VTI:          0.306 m LVOT/AV VTI ratio: 0.39  AORTA Ao Root diam: 3.00 cm Ao Asc diam:  3.30 cm  MITRAL VALVE                TRICUSPID VALVE MV Area (PHT): 2.94 cm     TR Peak grad:   25.6 mmHg MV Decel Time: 258 msec     TR Vmax:        253.00 cm/s MV E velocity: 119.00 cm/s  Estimated RAP:  3.00 mmHg MV A velocity: 112.00 cm/s  RVSP:           28.6 mmHg MV E/A ratio:  1.06 SHUNTS Systemic  VTI:  0.31 m Systemic Diam: 2.00 cm  Charlton Haws MD Electronically signed by Charlton Haws MD Signature Date/Time: 10/23/2022/11:03:55 AM    Final            Recent Labs: 02/03/2023: Magnesium 2.2 02/28/2023: BUN 13; Creatinine, Ser 0.62; Hemoglobin 11.2; Platelets 256; Potassium 3.9; Sodium 139  Recent Lipid Panel    Component Value Date/Time   CHOL 169 09/23/2021 0858   TRIG 102 09/23/2021 0858   HDL 66 09/23/2021 0858   CHOLHDL 2.5 12/22/2019 1128   LDLCALC 85 09/23/2021 0858    History of Present Illness    77 year old female with the above past medical history including paroxysmal atrial fibrillation, aortic stenosis, carotid artery disease, hypertension, and hyperlipidemia.   Echocardiogram in 09/2021 showed normal LV systolic function, G1 DD, mild aortic stenosis.  Carotid Dopplers at the time revealed moderate R ICA and high-grade LICA stenosis.  She was referred to vascular surgery.  Repeat echocardiogram in 10/2022 showed EF 60 to 65%, normal LV function, no RWMA, mild LVH, G1 DD, normal RV systolic function, mild mitral valve regurgitation,  moderate aortic stenosis, mean gradient 19 mmHg, increased from prior.  Repeat echocardiogram was recommended in 1 year.  Most recent carotid ultrasound in 10/2022 showed 80 to 99% R ICA stenosis, 1 to 39% LICA stenosis.  She was asymptomatic.  Follow-up CTA of the head and neck was recommended in 6 months.  She saw her PCP in 01/2023 who identified new atrial fibrillation with RVR. She was started on Eliquis and metoprolol and advised to follow-up with cardiology as an outpatient.  Labs including BMET, CBC, and TSH were unremarkable.  She underwent successful DCCV on 02/25/2023 with restoration of sinus rhythm.  Unfortunately, days later she had  early recurrence of atrial fibrillation, which prompted ED visit.  She was referred to the A-fib clinic and was seen on 03/09/2023.  Treatment options including Tikosyn and amiodarone were discussed.  Sleep  study was ordered and is pending.  She was last seen in the office on 03/15/2023 and noted increased shortness of breath, bilateral lower extremity edema.  Prior to this visit her PCP increased her metoprolol to 100 mg daily.  She was started on Lasix 40 mg daily.   She presents today for follow-up.  Her daughter is present by phone.  Since her last visit she has Been stable from a cardiac standpoint.  She has improved dramatically with the addition of Lasix.  She does note some dyspnea with significant exertion, however, her activity tolerance has improved.  Edema has also improved.  She has lost weight.  Heart rate has been well-controlled.  We had another long conversation about treatment options for atrial fibrillation.  Patient prefers not to return to the A-fib clinic.  In this setting, will refer to EP for further evaluation given early recurrence of atrial fibrillation.  She is pending sleep study on 04/26/2023 and would like to wait till after this test to see EP.  Will check BMET today.  Will follow-up with general cards APP in 3 months.  Keep follow-up with Dr. Allyson Sabal in 09/2023. 1. Persistent atrial fibrillation/bilateral lower extremity edema/shortness of breath: S/p DCCV on 02/25/2023 with early recurrence of atrial fibrillation.   Following her cardioversion she noted increased shortness of breath, bilateral lower extremity edema.  Her symptoms worsened with recurrence of atrial fibrillation and improved with better heart rate control.  EKG today shows rate controlled A-fib.  She was seen in the A-fib clinic recently and was given the option of medical therapy with amiodarone or Tikosyn.  She is pending sleep study.   We had a long conversation today about ongoing management options. We discussed relationship to heart failure symptoms and uncontrolled atrial fibrillation. She is still weighing her options.  Given ongoing shortness of breath, bilateral lower extremity edema, will start Lasix 40 mg  daily.  Will plan for close follow-up with repeat BMET at follow-up visit next week.  Discussed ongoing monitoring with daily weights, sodium and fluid recommendations.  Discussed potential triggers for A-fib.  She notes she has completely cut out caffeine.  Encouraged ongoing adherence to Eliquis.  Discussed ED precautions.  Continue metoprolol, Eliquis. She currently has a pill pack of metoprolol that has 50 mg tablets to last her for 90 days.  Will provide an additional 90-day supply of 50 mg tablets to meet the full dose of 100 mg daily.  Following the 90-day period we will transition to a 100 mg tablet daily.    2. Aortic stenosis: Most recent echo in 10/2022 showed EF 60 to 65%, normal LV  function, no RWMA, mild LVH, G1 DD, normal RV systolic function, mild mitral valve regurgitation, moderate aortic stenosis, mean gradient 19 mmHg, increased from prior.  Asymptomatic.  Plan for repeat echo in 10/2023.     3. Carotid artery disease: Most recent carotid ultrasound in 10/2022 showed 80 to 99% R ICA stenosis, 1 to 39% LICA stenosis. She was asymptomatic.  CT of the head and neck is scheduled for 04/2023. Following with vascular surgery.  Per Dr. Allyson Sabal, ok to D/C Aspirin given chronic DOAC therapy. Continue Lipitor.    4. Hypertension: BP well controlled. Continue current antihypertensive regimen.    5. Hyperlipidemia: LDL was 70 in 10/2022.  Continue Lipitor.   6. OSA: She has a history of sleep apnea, no longer on CPAP.  She is pending repeat sleep study per recommendation of A-fib clinic.   7. Disposition: Follow-up in   Home Medications    Current Outpatient Medications  Medication Sig Dispense Refill   acetaminophen (TYLENOL) 325 MG tablet Take 1-2 tablets (325-650 mg total) by mouth every 6 (six) hours as needed for mild pain (or temp >/= 101 F).     apixaban (ELIQUIS) 5 MG TABS tablet Take 1 tablet (5 mg total) by mouth 2 (two) times daily. Pt. Was given samples from her PCP enough to last one  month 180 tablet 3   atorvastatin (LIPITOR) 20 MG tablet Take 20 mg by mouth every morning.     cyanocobalamin (VITAMIN B12) 1000 MCG tablet Take 1,000 mcg by mouth daily.     dorzolamide-timolol (COSOPT) 22.3-6.8 MG/ML ophthalmic solution Place 1 drop into both eyes 2 (two) times daily. 10 mL 4   ELDERBERRY PO Take 2 tablets by mouth daily. Airborne Immune gummy     escitalopram (LEXAPRO) 10 MG tablet Take 10 mg by mouth daily.     ferrous sulfate 325 (65 FE) MG EC tablet Take 325 mg by mouth daily.     furosemide (LASIX) 40 MG tablet Take 1 tablet (40 mg total) by mouth daily. 90 tablet 3   GLUCOSAMINE-CHONDROITIN PO Take 1,500 mg by mouth daily.     hydrochlorothiazide (HYDRODIURIL) 25 MG tablet Take 25 mg by mouth daily.      ibuprofen (ADVIL) 600 MG tablet Take 1 tablet (600 mg total) by mouth every 6 (six) hours as needed. 30 tablet 0   losartan (COZAAR) 100 MG tablet Take 100 mg by mouth daily.     metoprolol succinate (TOPROL-XL) 100 MG 24 hr tablet Take 100 mg by mouth daily. Take with or immediately following a meal.     Multiple Vitamin (MULTIVITAMIN) capsule Take 1 capsule by mouth daily. Vita fusion     Multiple Vitamins-Minerals (AIRBORNE GUMMIES PO) Take 3 tablets by mouth daily. immune system     Vitamin D, Ergocalciferol, (DRISDOL) 1.25 MG (50000 UNIT) CAPS capsule Take 1 capsule (50,000 Units total) by mouth every 7 (seven) days. 4 capsule 0   metoprolol succinate (TOPROL-XL) 50 MG 24 hr tablet Take 1 tablet (50 mg total) by mouth daily. 90 tablet 0   No current facility-administered medications for this visit.     Review of Systems    She denies chest pain, palpitations, dyspnea, pnd, orthopnea, n, v, dizziness, syncope, edema, weight gain, or early satiety. All other systems reviewed and are otherwise negative except as noted above.   Physical Exam    VS:  BP 120/74 (BP Location: Right Arm, Patient Position: Sitting, Cuff Size: Large)   Pulse 73  Ht 5\' 3"  (1.6 m)    Wt 229 lb 3.2 oz (104 kg)   SpO2 98%   BMI 40.60 kg/m  GEN: Well nourished, well developed, in no acute distress. HEENT: normal. Neck: Supple, no JVD, carotid bruits, or masses. Cardiac: RRR, no murmurs, rubs, or gallops. No clubbing, cyanosis, edema.  Radials/DP/PT 2+ and equal bilaterally.  Respiratory:  Respirations regular and unlabored, clear to auscultation bilaterally. GI: Soft, nontender, nondistended, BS + x 4. MS: no deformity or atrophy. Skin: warm and dry, no rash. Neuro:  Strength and sensation are intact. Psych: Normal affect.  Accessory Clinical Findings    ECG personally reviewed by me today - EKG Interpretation  Date/Time:  Friday March 26 2023 14:36:55 EDT Ventricular Rate:  73 PR Interval:    QRS Duration: 90 QT Interval:  400 QTC Calculation: 440 R Axis:   45 Text Interpretation: Atrial fibrillation When compared with ECG of 09-Mar-2023 14:41, No significant change was found Confirmed by [name] ([id number]) on 03/26/2023 2:56:01 PM    Lab Results  Component Value Date   WBC 7.0 02/28/2023   HGB 11.2 (L) 02/28/2023   HCT 37.3 02/28/2023   MCV 65.7 (L) 02/28/2023   PLT 256 02/28/2023   Lab Results  Component Value Date   CREATININE 0.62 02/28/2023   BUN 13 02/28/2023   NA 139 02/28/2023   K 3.9 02/28/2023   CL 105 02/28/2023   CO2 24 02/28/2023   Lab Results  Component Value Date   ALT 33 (H) 01/27/2022   AST 29 01/27/2022   ALKPHOS 70 01/27/2022   BILITOT 0.6 01/27/2022   Lab Results  Component Value Date   CHOL 169 09/23/2021   HDL 66 09/23/2021   LDLCALC 85 09/23/2021   TRIG 102 09/23/2021   CHOLHDL 2.5 12/22/2019    Lab Results  Component Value Date   HGBA1C 5.6 01/27/2022    Assessment & Plan    1.  ***      Joylene Grapes, NP 03/26/2023, 2:56 PM

## 2023-03-27 LAB — BASIC METABOLIC PANEL
BUN/Creatinine Ratio: 34 — ABNORMAL HIGH (ref 12–28)
BUN: 23 mg/dL (ref 8–27)
CO2: 28 mmol/L (ref 20–29)
Calcium: 9.8 mg/dL (ref 8.7–10.3)
Chloride: 99 mmol/L (ref 96–106)
Creatinine, Ser: 0.67 mg/dL (ref 0.57–1.00)
Glucose: 96 mg/dL (ref 70–99)
Potassium: 4.5 mmol/L (ref 3.5–5.2)
Sodium: 142 mmol/L (ref 134–144)
eGFR: 91 mL/min/{1.73_m2} (ref >59)

## 2023-03-28 ENCOUNTER — Encounter: Payer: Self-pay | Admitting: Nurse Practitioner

## 2023-04-02 DIAGNOSIS — M1711 Unilateral primary osteoarthritis, right knee: Secondary | ICD-10-CM | POA: Diagnosis not present

## 2023-04-20 ENCOUNTER — Ambulatory Visit
Admission: RE | Admit: 2023-04-20 | Discharge: 2023-04-20 | Disposition: A | Discharge status: Home / Self Care | Payer: Medicare Other | Source: Ambulatory Visit | Attending: Surgery | Admitting: Surgery

## 2023-04-20 ENCOUNTER — Other Ambulatory Visit: Payer: Self-pay | Admitting: Surgery

## 2023-04-20 DIAGNOSIS — I6523 Occlusion and stenosis of bilateral carotid arteries: Secondary | ICD-10-CM

## 2023-04-20 DIAGNOSIS — I708 Atherosclerosis of other arteries: Secondary | ICD-10-CM | POA: Diagnosis not present

## 2023-04-20 DIAGNOSIS — I6502 Occlusion and stenosis of left vertebral artery: Secondary | ICD-10-CM | POA: Diagnosis not present

## 2023-04-20 DIAGNOSIS — I700 Atherosclerosis of aorta: Secondary | ICD-10-CM | POA: Diagnosis not present

## 2023-04-20 MED ORDER — IOPAMIDOL (ISOVUE-370) INJECTION 76%
75.0000 mL | Freq: Once | INTRAVENOUS | Status: AC | PRN
  Administered 2023-04-20: 75 mL via INTRAVENOUS

## 2023-04-21 DIAGNOSIS — H3581 Retinal edema: Secondary | ICD-10-CM | POA: Diagnosis not present

## 2023-04-21 DIAGNOSIS — H2513 Age-related nuclear cataract, bilateral: Secondary | ICD-10-CM | POA: Diagnosis not present

## 2023-04-21 DIAGNOSIS — H40053 Ocular hypertension, bilateral: Secondary | ICD-10-CM | POA: Diagnosis not present

## 2023-04-21 DIAGNOSIS — H3582 Retinal ischemia: Secondary | ICD-10-CM | POA: Diagnosis not present

## 2023-04-21 DIAGNOSIS — H02831 Dermatochalasis of right upper eyelid: Secondary | ICD-10-CM | POA: Diagnosis not present

## 2023-04-21 DIAGNOSIS — H02834 Dermatochalasis of left upper eyelid: Secondary | ICD-10-CM | POA: Diagnosis not present

## 2023-04-21 DIAGNOSIS — H35033 Hypertensive retinopathy, bilateral: Secondary | ICD-10-CM | POA: Diagnosis not present

## 2023-04-26 ENCOUNTER — Ambulatory Visit: Payer: Medicare Other | Admitting: Surgery

## 2023-05-10 ENCOUNTER — Telehealth: Payer: Self-pay | Admitting: Cardiovascular Disease

## 2023-05-10 ENCOUNTER — Encounter: Payer: Self-pay | Admitting: Surgery

## 2023-05-10 ENCOUNTER — Ambulatory Visit: Payer: Medicare Other | Admitting: Surgery

## 2023-05-10 VITALS — BP 122/61 | HR 72 | Temp 98.1°F | Resp 20 | Ht 63.0 in | Wt 234.0 lb

## 2023-05-10 DIAGNOSIS — I6523 Occlusion and stenosis of bilateral carotid arteries: Secondary | ICD-10-CM | POA: Diagnosis not present

## 2023-05-10 NOTE — Telephone Encounter (Signed)
   Pre-operative Risk Assessment    Patient Name: Kristina [name]  DOB: 07-Apr-1946 MRN: [id number]      Request for Surgical Clearance    Procedure:   Right Carotid Endarterectomy  Date of Surgery:  Clearance TBD                                 Surgeon:  Dr. Kristie Cowman Group or Practice Name:  [facility] Phone number:  [phone] Fax number:  [phone]   Type of Clearance Requested:   - Medical    Type of Anesthesia:  Not Indicated   Additional requests/questions:   Pt is scheduled to see Dr. [name] 08/06. Would like for patient to receive clearance at this appointment.  Cathey Endow   05/10/2023, 2:24 PM

## 2023-05-10 NOTE — Progress Notes (Signed)
[facility] [place]  Patient name: Kristina [name] MRN: [id number] DOB: [birth year] Sex: female   REASON FOR VISIT:    Follow up  HISOTRY OF PRESENT ILLNESS:    Kristina [name] is a 77 y.o. female who is status post left carotid endarterectomy on 09/27/2019 for symptomatic stenosis (left amaurosis).  She had an ultrasound in 2022 that suggested greater than 80% right-sided stenosis.  She was sent for CT angiogram that indicated only about 35% stenosis.  She remains asymptomatic.  Duplex in 2024 showed grade stenosis and so she was sent for a CT angiogram for further evaluation.   She is medically managed for hypertension with an ARB.  She takes a statin for hypercholesterolemia.  She is on aspirin.  She is a former smoker.  PAST MEDICAL HISTORY:   Past Medical History:  Diagnosis Date   Allergic rhinitis    Anemia glaucoma   Anxiety    Back pain    Cataract    Mixed OU   DDD (degenerative disc disease), lumbar    High blood pressure    High cholesterol    History of stomach ulcers    Hypertensive retinopathy    OU   Rheumatic fever    Rheumatic fever    Rheumatoid arteritis (HCC)      FAMILY HISTORY:   Family History  Problem Relation Age of Onset   Kidney disease Mother    Hypertension Mother    Glaucoma Mother    Heart failure Father    Emphysema Father    Asthma Father    Hypertension Father    Heart disease Father    COPD Brother    Allergic rhinitis Neg Hx    Angioedema Neg Hx    Atopy Neg Hx    Eczema Neg Hx    Immunodeficiency Neg Hx    Urticaria Neg Hx     SOCIAL HISTORY:   Social History   Tobacco Use   Smoking status: Former    Current packs/day: 0.00    Average packs/day: 1.5 packs/day for 30.0 years (45.0 ttl pk-yrs)    Types: Cigarettes    Start date: 43    Quit date: 2009    Years since quitting: 15.6   Smokeless tobacco: Never  Substance Use Topics   Alcohol use: Yes     Comment: social     ALLERGIES:   Allergies  Allergen Reactions   Penicillins Anaphylaxis and Itching    Did it involve swelling of the face/tongue/throat, SOB, or low BP? Yes Did it involve sudden or severe rash/hives, skin peeling, or any reaction on the inside of your mouth or nose? No Did you need to seek medical attention at a hospital or doctor's office? Yes When did it last happen?      Childhood allergy If all above answers are "NO", may proceed with cephalosporin use.    Mite (D. Farinae)     Other reaction(s): sinus swelling     CURRENT MEDICATIONS:   Current Outpatient Medications  Medication Sig Dispense Refill   apixaban (ELIQUIS) 5 MG TABS tablet Take 1 tablet (5 mg total) by mouth 2 (two) times daily. Pt. Was given samples from her PCP enough to last one month 180 tablet 3   atorvastatin (LIPITOR) 20 MG tablet Take 20 mg by mouth every morning.     cyanocobalamin (VITAMIN B12) 1000 MCG tablet Take 1,000 mcg by mouth daily.     dorzolamide-timolol (COSOPT) 22.3-6.8 MG/ML ophthalmic  solution Place 1 drop into both eyes 2 (two) times daily. 10 mL 4   ELDERBERRY PO Take 2 tablets by mouth daily. Airborne Immune gummy     escitalopram (LEXAPRO) 10 MG tablet Take 10 mg by mouth daily.     ferrous sulfate 325 (65 FE) MG EC tablet Take 325 mg by mouth daily.     furosemide (LASIX) 40 MG tablet Take 1 tablet (40 mg total) by mouth daily. 90 tablet 3   GLUCOSAMINE-CHONDROITIN PO Take 1,500 mg by mouth daily.     hydrochlorothiazide (HYDRODIURIL) 25 MG tablet Take 25 mg by mouth daily.      ibuprofen (ADVIL) 600 MG tablet Take 1 tablet (600 mg total) by mouth every 6 (six) hours as needed. 30 tablet 0   losartan (COZAAR) 100 MG tablet Take 100 mg by mouth daily.     metoprolol succinate (TOPROL-XL) 100 MG 24 hr tablet Take 100 mg by mouth daily. Take with or immediately following a meal.     Multiple Vitamin (MULTIVITAMIN) capsule Take 1 capsule by mouth daily. Vita fusion      Multiple Vitamins-Minerals (AIRBORNE GUMMIES PO) Take 3 tablets by mouth daily. immune system     Vitamin D, Ergocalciferol, (DRISDOL) 1.25 MG (50000 UNIT) CAPS capsule Take 1 capsule (50,000 Units total) by mouth every 7 (seven) days. 4 capsule 0   No current facility-administered medications for this visit.    REVIEW OF SYSTEMS:   [X]  denotes positive finding, [ ]  denotes negative finding Cardiac  Comments:  Chest pain or chest pressure:    Shortness of breath upon exertion:    Short of breath when lying flat:    Irregular heart rhythm: x       Vascular    Pain in calf, thigh, or hip brought on by ambulation:    Pain in feet at night that wakes you up from your sleep:     Blood clot in your veins:    Leg swelling:         Pulmonary    Oxygen at home:    Productive cough:     Wheezing:         Neurologic    Sudden weakness in arms or legs:     Sudden numbness in arms or legs:     Sudden onset of difficulty speaking or slurred speech:    Temporary loss of vision in one eye:     Problems with dizziness:         Gastrointestinal    Blood in stool:     Vomited blood:         Genitourinary    Burning when urinating:     Blood in urine:        Psychiatric    Major depression:         Hematologic    Bleeding problems:    Problems with blood clotting too easily:        Skin    Rashes or ulcers:        Constitutional    Fever or chills:      PHYSICAL EXAM:   Vitals:   05/10/23 1358 05/10/23 1400  BP: 117/88 122/61  Pulse: 72   Resp: 20   Temp: 98.1 F (36.7 C)   SpO2: 96%   Weight: 234 lb (106.1 kg)   Height: 5\' 3"  (1.6 m)     GENERAL: The patient is a well-nourished female, in no acute distress. The vital  signs are documented above. CARDIAC: There is a regular rate and rhythm.  PULMONARY: Non-labored respirations MUSCULOSKELETAL: There are no major deformities or cyanosis. NEUROLOGIC: No focal weakness or paresthesias are detected. SKIN: There  are no ulcers or rashes noted. PSYCHIATRIC: The patient has a normal affect.  STUDIES:   I have reviewed the following CT angiogram:  Aortic atherosclerosis. 2. Advanced calcified plaque at the right carotid bifurcation and proximal ICA bulb. Minimal diameter is only 1.3 cm. Compared to a more distal cervical ICA diameter of 4 mm, this indicates a 70% stenosis. Beyond that, the cervical ICA is patent to the skull base. 3. Wide patency of the left carotid bifurcation and proximal ICA bulb. Previous carotid endarterectomy. Mild narrowing of the distal ICA bulb, 3.5 mm in diameter, stenosis only 10%. 4. 30% stenosis of the both subclavian arteries proximal to the vertebral artery origins. 30% stenosis of the left vertebral artery origin. 5. No intracranial large vessel occlusion or proximal stenosis.  MEDICAL ISSUES:   Asymptomatic right carotid stenosis: CT scan shows at least a 70% stenosis on the right and duplex shows greater than 80%.  She is asymptomatic.  I have recommended that we proceed with right carotid endarterectomy.  We discussed the risk of stroke and nerve injury.  I would like to get this done within the next few months.  She is currently being evaluated for new onset atrial fibrillation.  She has had cardioversion in the past which lasted for 4 days.  She is scheduled to see cardiology tomorrow.  She would need to be off of her Eliquis prior to surgery and so I will let cardiology dictate the timing.    Charlena Cross, MD, FACS [facility] [phone] Pager [phone]

## 2023-05-11 ENCOUNTER — Ambulatory Visit: Payer: Medicare Other | Attending: Cardiovascular Disease | Admitting: Cardiovascular Disease

## 2023-05-11 ENCOUNTER — Encounter: Payer: Self-pay | Admitting: Cardiovascular Disease

## 2023-05-11 ENCOUNTER — Telehealth: Payer: Self-pay | Admitting: Pharmacist

## 2023-05-11 VITALS — BP 128/74 | HR 85 | Ht 63.0 in | Wt 233.0 lb

## 2023-05-11 DIAGNOSIS — D6869 Other thrombophilia: Secondary | ICD-10-CM | POA: Diagnosis not present

## 2023-05-11 DIAGNOSIS — I350 Nonrheumatic aortic (valve) stenosis: Secondary | ICD-10-CM

## 2023-05-11 DIAGNOSIS — I4819 Other persistent atrial fibrillation: Secondary | ICD-10-CM

## 2023-05-11 NOTE — Telephone Encounter (Signed)
Medication list reviewed in anticipation of upcoming Tikosyn initiation. Patient is taking 1 contraindicated or QTc prolonging medications.   Concurrent use of ESCITALOPRAM and QT INTERVAL-PROLONGING DRUGS may result in an increased risk of QT interval prolongation. Recommend patient discuss with PCP about switching escitalopram to duloxetine.  Patient is anticoagulated on Eliquis on the appropriate dose. Please ensure that patient has not missed any anticoagulation doses in the 3 weeks prior to Tikosyn initiation.   Patient will need to be counseled to avoid use of Benadryl while on Tikosyn and in the 2-3 days prior to Tikosyn initiation.

## 2023-05-11 NOTE — Telephone Encounter (Signed)
-----   Message from Nurse Milta Deiters sent at 05/11/2023  3:33 PM EDT ----- Regarding: FW: Tikosyn Load Pt for tikosyn please review meds thanks stacy ----- Message ----- From: Festus Holts, RN Sent: 05/11/2023   3:20 PM EDT To: Shona Simpson, RN Subject: Sandria Manly afternoon, Stacy!  This patient was seen in clinic today by Dr [name] and needs to be set up for a tikosyn load. Patient already made aware to check on cost   Thanks Brayton Caves

## 2023-05-11 NOTE — Progress Notes (Unsigned)
Electrophysiology Office Note:    Date:  05/11/2023   ID:  Kristina [name], DOB 1946-06-17, MRN [id number]  PCP:  Sigmund Hazel, MD   [facility] Providers Cardiologist:  Nanetta Batty, MD { Click to update primary MD,subspecialty MD or APP then REFRESH:1}    Referring MD: Joylene Grapes, NP   History of Present Illness:    Kristina [name] is a 77 y.o. female with a medical history significant for moderate aortic stenosis, carotid stenosis, obstructive sleep apnea, and persistent atrial fibrillation referred for management of atrial fibrillation.     She was diagnosed with atrial fibrillation in April 2024.  She presented with dyspnea on exertion, fatigue, and elevated heart rates.  The symptoms improved with heart rate control.  She underwent DC cardioversion on Feb 25, 2023 but returned to the ED 3 days later with recurrence of atrial fibrillation.  On ER follow-up, she was not symptomatic but was alerted by her watch.  She was referred to atrial fibrillation clinic where medical therapy with Tikosyn or amiodarone was suggested.  She was thought not to be a good candidate for ablation due to to the BMI of 42.5.  Sleep study has been ordered.     She remains in atrial fibrillation.  She does not have as much energy or exertional capacity as she did prior to having AF.  She did not feel significant better after her cardioversion, but she was in sinus for only 3 days.  She is being evaluated for carotid endarterectomy.  She reports that she is able to climb a flight of stairs.  EKGs/Labs/Other Studies Reviewed Today:    Echocardiogram:  TTE 10/23/2022 EF 60-65% Mild LVH. Grade I diastolic dysfunction. Moderately dilated LA. Moderate aortic stenosis.   Monitors:   Stress testing:    Advanced imaging:    Cardiac catherization    EKG:   EKG Interpretation Date/Time:  Tuesday May 11 2023 14:28:56 EDT Ventricular Rate:  85 PR Interval:    QRS  Duration:  88 QT Interval:  382 QTC Calculation: 454 R Axis:   68  Text Interpretation: Atrial fibrillation When compared with ECG of 26-Mar-2023 14:36, No significant change was found Confirmed by York Pellant [phone]) on 05/11/2023 2:52:57 PM     Physical Exam:    VS:  BP 128/74   Pulse 85   Ht 5\' 3"  (1.6 m)   Wt 233 lb (105.7 kg)   SpO2 97%   BMI 41.27 kg/m     Wt Readings from Last 3 Encounters:  05/11/23 233 lb (105.7 kg)  05/10/23 234 lb (106.1 kg)  03/26/23 229 lb 3.2 oz (104 kg)     GEN: Well nourished, well developed in no acute distress CARDIAC: iRRR, no murmurs, rubs, gallops RESPIRATORY:  Normal work of breathing MUSCULOSKELETAL: no edema    ASSESSMENT & PLAN:    Persistent atrial fibrillation ERAF after DCCV 02/25/23 Symptomatic with fatigue, shortness of breath --improved with rate control She had CHF with rapid rates Due to the presence of symptoms, I think rhythm control is appropriate At present, BMI poses increased risk with ablation I recommended Tikosyn load - explained the risks of Tikosyn including life threatening arrhythmia that requires careful monitoring She is making progress with weight loss and has sleep study pending We will follow-up 3 months after Tikosyn load and if her BMI is below 40, we can consider scheduling an atrial fibrillation ablation.  CHFpEF AF with RVR in the setting of moderate AS and  Grade I diastolic dysfunction  Obstructive sleep apnea Sleep study pending  Bilateral carotid stenosis Status post CEA, now planning for contralateral CEA  Moderate aortic stenosis Monitor  Pre-operative evaluation Revised cardiac risk index estimates that she is at low risk for cardiovascular complications such as malignant arrhythmia, pulmonary edema, heart attack, or stroke.  I anticipate her risk is slightly higher than the estimated 0.9% due to presence of moderate aortic stenosis and diastolic dysfunction.  No further testing  indicated.   Signed, Maurice Small, MD  05/11/2023 2:53 PM    [facility]

## 2023-05-11 NOTE — Telephone Encounter (Signed)
[facility] is calling to update on surgery date and other information:  Surgery projection date: 05/27/2022  Type of Anesthesia: General

## 2023-05-11 NOTE — Patient Instructions (Signed)
Medication Instructions:  Tikosyn load - we will contact you to set this up as it requires hospitalization, in the meantime contact your pharmacy/insurance about cost *If you need a refill on your cardiac medications before your next appointment, please call your pharmacy*   Follow-Up: At [facility], you and your health needs are our priority.  As part of our continuing mission to provide you with exceptional heart care, we have created designated Provider Care Teams.  These Care Teams include your primary Cardiologist (physician) and Advanced Practice Providers (APPs -  Physician Assistants and Nurse Practitioners) who all work together to provide you with the care you need, when you need it.  We recommend signing up for the patient portal called "MyChart".  Sign up information is provided on this After Visit Summary.  MyChart is used to connect with patients for Virtual Visits (Telemedicine).  Patients are able to view lab/test results, encounter notes, upcoming appointments, etc.  Non-urgent messages can be sent to your provider as well.   To learn more about what you can do with MyChart, go to [email or web address].    Your next appointment:   4 months   Provider:   York Pellant, MD

## 2023-05-12 ENCOUNTER — Telehealth: Payer: Self-pay

## 2023-05-12 NOTE — Telephone Encounter (Signed)
Noted patient cleared by cardiology to proceed with right carotid endarterectomy. Contacted patient to schedule surgery, offering next available date of 8/23 with Dr. Myra Gianotti. Patient declined, stating she will be out of town for a couple of weeks. Offered to schedule patient in September but she requested to contact office back to schedule after she has pending sleep study completed. Patient was made aware that testing can be completed after surgery, as Dr. [name] indicated no additional testing needed prior but she still preferred to wait. Contact information given to patient when ready to schedule. Will update Dr. Myra Gianotti.

## 2023-05-14 NOTE — Telephone Encounter (Signed)
Patient notified to stop hydrochlorothiazide 3 days prior to admission.   Pt will contact PCP regarding stopping lexapro - pt does not feel she needs replacement but encouraged pt to discuss weaning off lexapro and shared decision on if replacement warranted. Pt to update once she has spoken with PCP.

## 2023-05-28 ENCOUNTER — Other Ambulatory Visit: Payer: Self-pay

## 2023-05-28 DIAGNOSIS — I6523 Occlusion and stenosis of bilateral carotid arteries: Secondary | ICD-10-CM

## 2023-06-01 NOTE — Telephone Encounter (Signed)
Patient called requesting to proceed with scheduling right carotid endarterectomy in October. Surgery scheduled for 10/2. Instructions provided and will be also sent via MyChart. Voiced understanding to information provided.

## 2023-06-05 DIAGNOSIS — M62830 Muscle spasm of back: Secondary | ICD-10-CM | POA: Diagnosis not present

## 2023-06-14 ENCOUNTER — Other Ambulatory Visit (HOSPITAL_COMMUNITY): Payer: Self-pay | Admitting: Family Medicine

## 2023-06-14 DIAGNOSIS — R1903 Right lower quadrant abdominal swelling, mass and lump: Secondary | ICD-10-CM

## 2023-06-15 ENCOUNTER — Ambulatory Visit (HOSPITAL_COMMUNITY)
Admission: RE | Admit: 2023-06-15 | Discharge: 2023-06-15 | Disposition: A | Discharge status: Home / Self Care | Payer: Medicare Other | Source: Ambulatory Visit | Attending: Family Medicine | Admitting: Family Medicine

## 2023-06-15 DIAGNOSIS — Z9049 Acquired absence of other specified parts of digestive tract: Secondary | ICD-10-CM | POA: Diagnosis not present

## 2023-06-15 DIAGNOSIS — K5730 Diverticulosis of large intestine without perforation or abscess without bleeding: Secondary | ICD-10-CM | POA: Diagnosis not present

## 2023-06-15 DIAGNOSIS — R1903 Right lower quadrant abdominal swelling, mass and lump: Secondary | ICD-10-CM | POA: Insufficient documentation

## 2023-06-15 DIAGNOSIS — K4090 Unilateral inguinal hernia, without obstruction or gangrene, not specified as recurrent: Secondary | ICD-10-CM | POA: Diagnosis not present

## 2023-06-15 MED ORDER — IOHEXOL 350 MG/ML SOLN
75.0000 mL | Freq: Once | INTRAVENOUS | Status: AC | PRN
  Administered 2023-06-15: 75 mL via INTRAVENOUS

## 2023-06-16 DIAGNOSIS — I4891 Unspecified atrial fibrillation: Secondary | ICD-10-CM | POA: Diagnosis not present

## 2023-06-16 DIAGNOSIS — G4733 Obstructive sleep apnea (adult) (pediatric): Secondary | ICD-10-CM | POA: Diagnosis not present

## 2023-06-21 ENCOUNTER — Encounter: Payer: Self-pay | Admitting: Nurse Practitioner

## 2023-06-21 ENCOUNTER — Ambulatory Visit: Payer: Medicare Other | Attending: Nurse Practitioner | Admitting: Nurse Practitioner

## 2023-06-21 VITALS — BP 132/78 | HR 89 | Ht 63.0 in | Wt 241.8 lb

## 2023-06-21 DIAGNOSIS — I350 Nonrheumatic aortic (valve) stenosis: Secondary | ICD-10-CM

## 2023-06-21 DIAGNOSIS — G4733 Obstructive sleep apnea (adult) (pediatric): Secondary | ICD-10-CM

## 2023-06-21 DIAGNOSIS — I10 Essential (primary) hypertension: Secondary | ICD-10-CM | POA: Diagnosis not present

## 2023-06-21 DIAGNOSIS — E785 Hyperlipidemia, unspecified: Secondary | ICD-10-CM

## 2023-06-21 DIAGNOSIS — I4819 Other persistent atrial fibrillation: Secondary | ICD-10-CM | POA: Diagnosis not present

## 2023-06-21 DIAGNOSIS — I6523 Occlusion and stenosis of bilateral carotid arteries: Secondary | ICD-10-CM

## 2023-06-21 NOTE — Progress Notes (Signed)
Office Visit    Patient Name: Kristina [name] Date of Encounter: 06/21/2023  Primary Care Provider:  Sigmund Hazel, MD Primary Cardiologist:  Nanetta Batty, MD  Chief Complaint    77 year old female with a history of paroxysmal atrial fibrillation, aortic stenosis, carotid artery disease, hypertension, and hyperlipidemia who presents for follow-up related to atrial fibrillation.    Past Medical History    Past Medical History:  Diagnosis Date   Allergic rhinitis    Anemia glaucoma   Anxiety    Back pain    Cataract    Mixed OU   DDD (degenerative disc disease), lumbar    High blood pressure    High cholesterol    History of stomach ulcers    Hypertensive retinopathy    OU   Rheumatic fever    Rheumatic fever    Rheumatoid arteritis (HCC)    Past Surgical History:  Procedure Laterality Date   APPENDECTOMY  1955   CARDIOVERSION N/A 02/25/2023   Procedure: CARDIOVERSION;  Surgeon: Jodelle Red, MD;  Location: [facility] INVASIVE CV LAB;  Service: Cardiovascular;  Laterality: N/A;   CHOLECYSTECTOMY  1963   DILATATION & CURETTAGE/HYSTEROSCOPY WITH MYOSURE N/A 04/15/2022   Procedure: DILATATION & CURETTAGE/HYSTEROSCOPY WITH MYOSURE;  Surgeon: Gerald Leitz, MD;  Location: [facility] OR;  Service: Gynecology;  Laterality: N/A;   ENDARTERECTOMY Left 09/27/2019   Procedure: ENDARTERECTOMY CAROTID LEFT;  Surgeon: Nada Libman, MD;  Location: [facility] OR;  Service: Vascular;  Laterality: Left;   HERNIA REPAIR  2007   HERNIA REPAIR  2015   PATCH ANGIOPLASTY Left 09/27/2019   Procedure: Patch Angioplasty Using Livia Snellen;  Surgeon: Nada Libman, MD;  Location: [facility] OR;  Service: Vascular;  Laterality: Left;   REPLACEMENT TOTAL KNEE  2016   TONSILLECTOMY      Allergies  Allergies  Allergen Reactions   Penicillins Anaphylaxis and Itching    Did it involve swelling of the face/tongue/throat, SOB, or low BP? Yes Did it involve sudden or severe rash/hives, skin peeling, or any reaction  on the inside of your mouth or nose? No Did you need to seek medical attention at a hospital or doctor's office? Yes When did it last happen?      Childhood allergy If all above answers are "NO", may proceed with cephalosporin use.    Mite (D. Farinae) Swelling    Other reaction(s): sinus swelling     Labs/Other Studies Reviewed    The following studies were reviewed today:  Cardiac Studies & Procedures       ECHOCARDIOGRAM  ECHOCARDIOGRAM COMPLETE 10/23/2022  Narrative ECHOCARDIOGRAM REPORT    Patient Name:   Kristina [name] Date of Exam: 10/23/2022 Medical Rec #:  [id number]         Height:       63.0 in Accession #:    [id number]        Weight:       235.2 lb Date of Birth:  Mar 30, 1946          BSA:          2.072 m Patient Age:    76 years          BP:           138/84 mmHg Patient Gender: F                 HR:           66 bpm. Exam Location:  [address]  Procedure: 2D Echo, Cardiac  Doppler and Color Doppler  Indications:    I35.0 AS  History:        Patient has prior history of Echocardiogram examinations, most recent 09/08/2021. AS; Risk Factors:Hypertension, Dyslipidemia and Former Smoker.  Sonographer:    Samule Ohm RDCS Referring Phys: [phone] JONATHAN J BERRY  IMPRESSIONS   1. Left ventricular ejection fraction, by estimation, is 60 to 65%. The left ventricle has normal function. The left ventricle has no regional wall motion abnormalities. There is mild left ventricular hypertrophy. Left ventricular diastolic parameters are consistent with Grade I diastolic dysfunction (impaired relaxation). Elevated left ventricular end-diastolic pressure. 2. Right ventricular systolic function is normal. The right ventricular size is normal. 3. Left atrial size was moderately dilated. 4. The mitral valve is abnormal. Mild mitral valve regurgitation. No evidence of mitral stenosis. Moderate mitral annular calcification. 5. Gradients have increased since TTE done  09/08/21 . The aortic valve is tricuspid. There is moderate calcification of the aortic valve. There is moderate thickening of the aortic valve. Aortic valve regurgitation is not visualized. Moderate aortic valve stenosis. 6. The inferior vena cava is normal in size with greater than 50% respiratory variability, suggesting right atrial pressure of 3 mmHg.  FINDINGS Left Ventricle: Left ventricular ejection fraction, by estimation, is 60 to 65%. The left ventricle has normal function. The left ventricle has no regional wall motion abnormalities. The left ventricular internal cavity size was normal in size. There is mild left ventricular hypertrophy. Left ventricular diastolic parameters are consistent with Grade I diastolic dysfunction (impaired relaxation). Elevated left ventricular end-diastolic pressure.  Right Ventricle: The right ventricular size is normal. No increase in right ventricular wall thickness. Right ventricular systolic function is normal.  Left Atrium: Left atrial size was moderately dilated.  Right Atrium: Right atrial size was normal in size.  Pericardium: There is no evidence of pericardial effusion.  Mitral Valve: The mitral valve is abnormal. There is mild thickening of the mitral valve leaflet(s). There is mild calcification of the mitral valve leaflet(s). Moderate mitral annular calcification. Mild mitral valve regurgitation. No evidence of mitral valve stenosis.  Tricuspid Valve: The tricuspid valve is normal in structure. Tricuspid valve regurgitation is mild . No evidence of tricuspid stenosis.  Aortic Valve: Gradients have increased since TTE done 09/08/21. The aortic valve is tricuspid. There is moderate calcification of the aortic valve. There is moderate thickening of the aortic valve. Aortic valve regurgitation is not visualized. Moderate aortic stenosis is present. Aortic valve mean gradient measures 19.0 mmHg. Aortic valve peak gradient measures 35.1 mmHg. Aortic  valve area, by VTI measures 1.22 cm.  Pulmonic Valve: The pulmonic valve was normal in structure. Pulmonic valve regurgitation is not visualized. No evidence of pulmonic stenosis.  Aorta: The aortic root is normal in size and structure.  Venous: The inferior vena cava is normal in size with greater than 50% respiratory variability, suggesting right atrial pressure of 3 mmHg.  IAS/Shunts: No atrial level shunt detected by color flow Doppler.   LEFT VENTRICLE PLAX 2D LVIDd:         5.30 cm   Diastology LVIDs:         3.50 cm   LV e' medial:    7.29 cm/s LV PW:         1.30 cm   LV E/e' medial:  16.3 LV IVS:        1.40 cm   LV e' lateral:   6.74 cm/s LVOT diam:     2.00 cm  LV E/e' lateral: 17.7 LV SV:         96 LV SV Index:   46 LVOT Area:     3.14 cm   RIGHT VENTRICLE             IVC RV S prime:     15.60 cm/s  IVC diam: 1.10 cm TAPSE (M-mode): 2.3 cm RVSP:           28.6 mmHg  LEFT ATRIUM             Index        RIGHT ATRIUM           Index LA diam:        4.30 cm 2.08 cm/m   RA Pressure: 3.00 mmHg LA Vol (A2C):   93.8 ml 45.28 ml/m  RA Area:     12.10 cm LA Vol (A4C):   71.8 ml 34.66 ml/m  RA Volume:   29.10 ml  14.05 ml/m LA Biplane Vol: 88.5 ml 42.72 ml/m AORTIC VALVE AV Area (Vmax):    1.24 cm AV Area (Vmean):   1.19 cm AV Area (VTI):     1.22 cm AV Vmax:           296.33 cm/s AV Vmean:          202.333 cm/s AV VTI:            0.785 m AV Peak Grad:      35.1 mmHg AV Mean Grad:      19.0 mmHg LVOT Vmax:         117.00 cm/s LVOT Vmean:        76.600 cm/s LVOT VTI:          0.306 m LVOT/AV VTI ratio: 0.39  AORTA Ao Root diam: 3.00 cm Ao Asc diam:  3.30 cm  MITRAL VALVE                TRICUSPID VALVE MV Area (PHT): 2.94 cm     TR Peak grad:   25.6 mmHg MV Decel Time: 258 msec     TR Vmax:        253.00 cm/s MV E velocity: 119.00 cm/s  Estimated RAP:  3.00 mmHg MV A velocity: 112.00 cm/s  RVSP:           28.6 mmHg MV E/A ratio:   1.06 SHUNTS Systemic VTI:  0.31 m Systemic Diam: 2.00 cm  Charlton Haws MD Electronically signed by Charlton Haws MD Signature Date/Time: 10/23/2022/11:03:55 AM    Final            Recent Labs: 02/03/2023: Magnesium 2.2 02/28/2023: Hemoglobin 11.2; Platelets 256 03/26/2023: BUN 23; Creatinine, Ser 0.67; Potassium 4.5; Sodium 142  Recent Lipid Panel    Component Value Date/Time   CHOL 169 09/23/2021 0858   TRIG 102 09/23/2021 0858   HDL 66 09/23/2021 0858   CHOLHDL 2.5 12/22/2019 1128   LDLCALC 85 09/23/2021 0858    History of Present Illness    77 year old female with the above past medical history including paroxysmal atrial fibrillation, aortic stenosis, carotid artery disease, hypertension, and hyperlipidemia.   Echocardiogram in 09/2021 showed normal LV systolic function, G1 DD, mild aortic stenosis.  Carotid Dopplers at the time revealed moderate R ICA and high-grade LICA stenosis.  She was referred to vascular surgery.  Repeat echocardiogram in 10/2022 showed EF 60 to 65%, normal LV function, no RWMA, mild LVH, G1 DD, normal RV systolic function, mild mitral valve  regurgitation, moderate aortic stenosis, mean gradient 19 mmHg, increased from prior.  Repeat echocardiogram was recommended in 1 year.  Most recent carotid ultrasound in 10/2022 showed 80 to 99% R ICA stenosis, 1 to 39% LICA stenosis.  She was asymptomatic.  Follow-up CTA of the head and neck was recommended in 6 months.  She saw her PCP in 01/2023 who identified new atrial fibrillation with RVR. She was started on Eliquis and metoprolol and advised to follow-up with cardiology as an outpatient.  Labs including BMET, CBC, and TSH were unremarkable.  She underwent successful DCCV on 02/25/2023 with restoration of sinus rhythm.  Unfortunately, days later she had  early recurrence of atrial fibrillation, which prompted ED visit.  She was referred to the A-fib clinic and was seen on 03/09/2023.  Treatment options including Tikosyn and  amiodarone were discussed.  Sleep study was ordered and is pending.  She was last seen in the office on 03/15/2023 and noted increased shortness of breath, bilateral lower extremity edema.  Prior to this visit her PCP increased her metoprolol to 100 mg daily.  She was started on Lasix 40 mg daily with improvement in her symptoms.  She was referred to EP for ongoing discussion of rhythm versus rate control strategy.  She was last seen in the office on 05/11/2023 by Dr. [name].  She is now pending Tikosyn load.  It was recommended she switch her citalopram to duloxetine given increased risk of QT prolongation.  She was cleared for carotid endarterectomy (scheduled for 07/07/23).   She presents today for follow-up. Since her last visit she has done well from a cardiac standpoint.  She has not been taking her Lasix daily and as a result she has noted some increased dyspnea on exertion, fatigue, mild bilateral lower extremity edema.  HR has been well-controlled.  She is eager to proceed with Tikosyn load as well as her upcoming carotid endarterectomy.  Home Medications    Current Outpatient Medications  Medication Sig Dispense Refill   apixaban (ELIQUIS) 5 MG TABS tablet Take 1 tablet (5 mg total) by mouth 2 (two) times daily. Pt. Was given samples from her PCP enough to last one month 180 tablet 3   atorvastatin (LIPITOR) 20 MG tablet Take 20 mg by mouth every morning.     calcium elemental as carbonate (BARIATRIC TUMS ULTRA) 400 MG chewable tablet Chew 1,000 mg by mouth daily.     cyanocobalamin (VITAMIN B12) 1000 MCG tablet Take 1,000 mcg by mouth daily.     dorzolamide-timolol (COSOPT) 22.3-6.8 MG/ML ophthalmic solution Place 1 drop into both eyes 2 (two) times daily. 10 mL 4   ELDERBERRY PO Take 2 tablets by mouth daily. Airborne Immune gummy     ferrous sulfate 325 (65 FE) MG EC tablet Take 325 mg by mouth daily.     furosemide (LASIX) 40 MG tablet Take 40 mg by mouth daily as needed. For weight gain or  SOB     GLUCOSAMINE-CHONDROITIN PO Take 1,500 mg by mouth daily.     hydrochlorothiazide (HYDRODIURIL) 25 MG tablet Take 25 mg by mouth daily.      ibuprofen (ADVIL) 600 MG tablet Take 1 tablet (600 mg total) by mouth every 6 (six) hours as needed. 30 tablet 0   losartan (COZAAR) 100 MG tablet Take 100 mg by mouth daily.     metoprolol succinate (TOPROL-XL) 100 MG 24 hr tablet Take 100 mg by mouth daily. Take with or immediately following a meal.  Multiple Vitamin (MULTIVITAMIN) capsule Take 1 capsule by mouth daily. Vita fusion     Multiple Vitamins-Minerals (AIRBORNE GUMMIES PO) Take 3 tablets by mouth daily. immune system     Vitamin D, Ergocalciferol, (DRISDOL) 1.25 MG (50000 UNIT) CAPS capsule Take 1 capsule (50,000 Units total) by mouth every 7 (seven) days. 4 capsule 0   No current facility-administered medications for this visit.     Review of Systems    She denies chest pain, palpitations, pnd, orthopnea, n, v, dizziness, syncope, edema, weight gain, or early satiety. All other systems reviewed and are otherwise negative except as noted above.   Physical Exam    VS:  BP 132/78   Pulse 89   Ht 5\' 3"  (1.6 m)   Wt 241 lb 12.8 oz (109.7 kg)   SpO2 99%   BMI 42.83 kg/m  GEN: Well nourished, well developed, in no acute distress. HEENT: normal. Neck: Supple, no JVD, carotid bruits, or masses. Cardiac: RRR, no murmurs, rubs, or gallops. No clubbing, cyanosis, edema.  Radials/DP/PT 2+ and equal bilaterally.  Respiratory:  Respirations regular and unlabored, clear to auscultation bilaterally. GI: Soft, nontender, nondistended, BS + x 4. MS: no deformity or atrophy. Skin: warm and dry, no rash. Neuro:  Strength and sensation are intact. Psych: Normal affect.  Accessory Clinical Findings    ECG personally reviewed by me today - EKG Interpretation Date/Time:  Monday June 21 2023 10:12:29 EDT Ventricular Rate:  89 PR Interval:    QRS Duration:  82 QT Interval:  364 QTC  Calculation: 442 R Axis:   27  Text Interpretation: Atrial fibrillation When compared with ECG of 11-May-2023 14:28, No significant change was found Confirmed by [name] ([id number]) on 06/21/2023 10:37:42 AM    Lab Results  Component Value Date   WBC 7.0 02/28/2023   HGB 11.2 (L) 02/28/2023   HCT 37.3 02/28/2023   MCV 65.7 (L) 02/28/2023   PLT 256 02/28/2023   Lab Results  Component Value Date   CREATININE 0.67 03/26/2023   BUN 23 03/26/2023   NA 142 03/26/2023   K 4.5 03/26/2023   CL 99 03/26/2023   CO2 28 03/26/2023   Lab Results  Component Value Date   ALT 33 (H) 01/27/2022   AST 29 01/27/2022   ALKPHOS 70 01/27/2022   BILITOT 0.6 01/27/2022   Lab Results  Component Value Date   CHOL 169 09/23/2021   HDL 66 09/23/2021   LDLCALC 85 09/23/2021   TRIG 102 09/23/2021   CHOLHDL 2.5 12/22/2019    Lab Results  Component Value Date   HGBA1C 5.6 01/27/2022    Assessment & Plan   1. Persistent atrial fibrillation/bilateral lower extremity edema/shortness of breath: S/p DCCV on 02/25/2023 with early recurrence of atrial fibrillation.   Following her cardioversion she noted increased shortness of breath, bilateral lower extremity edema, improved with Lasix.  She has not been taking Lasix recently and has noted a slight increase in dyspnea on exertion, fatigue, bilateral lower extremity edema.  Advised her to resume Lasix 40 mg daily as needed.  She is pending Tikosyn load per EP. Continue metoprolol, Eliquis.    2. Aortic stenosis: Most recent echo in 10/2022 showed EF 60 to 65%, normal LV function, no RWMA, mild LVH, G1 DD, normal RV systolic function, mild mitral valve regurgitation, moderate aortic stenosis, mean gradient 19 mmHg, increased from prior.  Plan for repeat echo in 10/2023.     3. Carotid artery disease: Most recent carotid  ultrasound in 10/2022 showed 80 to 99% R ICA stenosis, 1 to 39% LICA stenosis.  CT of the head and neck in July 2024 showed closer to 70%  stenosis.  Following with vascular surgery, she is pending R CEA. No ASA in the setting of chronic DOAC therapy. Continue Lipitor.    4. Hypertension: BP well controlled. Continue current antihypertensive regimen.    5. Hyperlipidemia: LDL was 70 in 10/2022.  Continue Lipitor.   6. OSA: She has a history of sleep apnea.  Repeat sleep study showed OSA, she is pending CPAP titration.   7. Disposition:  Follow-up as scheduled with EP, follow-up as scheduled with Dr. Allyson Sabal in 09/2023.      Joylene Grapes, NP 06/21/2023, 11:12 AM

## 2023-06-21 NOTE — Patient Instructions (Signed)
Medication Instructions:  Your physician recommends that you continue on your current medications as directed. Please refer to the Current Medication list given to you today.  *If you need a refill on your cardiac medications before your next appointment, please call your pharmacy*   Lab Work: NONE ordered at this time of appointment    Testing/Procedures: NONE ordered at this time of appointment     Follow-Up: At [facility], you and your health needs are our priority.  As part of our continuing mission to provide you with exceptional heart care, we have created designated Provider Care Teams.  These Care Teams include your primary Cardiologist (physician) and Advanced Practice Providers (APPs -  Physician Assistants and Nurse Practitioners) who all work together to provide you with the care you need, when you need it.  We recommend signing up for the patient portal called "MyChart".  Sign up information is provided on this After Visit Summary.  MyChart is used to connect with patients for Virtual Visits (Telemedicine).  Patients are able to view lab/test results, encounter notes, upcoming appointments, etc.  Non-urgent messages can be sent to your provider as well.   To learn more about what you can do with MyChart, go to [email or web address].    Your next appointment:    Keep follow up   Provider:   Nanetta Batty, MD

## 2023-06-22 ENCOUNTER — Encounter (HOSPITAL_COMMUNITY): Payer: Self-pay

## 2023-06-22 DIAGNOSIS — I4819 Other persistent atrial fibrillation: Secondary | ICD-10-CM | POA: Diagnosis not present

## 2023-06-22 DIAGNOSIS — Z0000 Encounter for general adult medical examination without abnormal findings: Secondary | ICD-10-CM | POA: Diagnosis not present

## 2023-06-22 DIAGNOSIS — Z23 Encounter for immunization: Secondary | ICD-10-CM | POA: Diagnosis not present

## 2023-06-22 DIAGNOSIS — E7800 Pure hypercholesterolemia, unspecified: Secondary | ICD-10-CM | POA: Diagnosis not present

## 2023-06-22 DIAGNOSIS — D518 Other vitamin B12 deficiency anemias: Secondary | ICD-10-CM | POA: Diagnosis not present

## 2023-06-22 DIAGNOSIS — D508 Other iron deficiency anemias: Secondary | ICD-10-CM | POA: Diagnosis not present

## 2023-06-22 DIAGNOSIS — I10 Essential (primary) hypertension: Secondary | ICD-10-CM | POA: Diagnosis not present

## 2023-06-22 NOTE — Progress Notes (Signed)
Surgical Instructions   Your procedure is scheduled on October 2. Report to [facility] Main Entrance "A" at 06:30 A.M., then check in with the Admitting office. Any questions or running late day of surgery: call [phone]  Questions prior to your surgery date: call [phone], Monday-Friday, 8am-4pm. If you experience any cold or flu symptoms such as cough, fever, chills, shortness of breath, etc. between now and your scheduled surgery, please notify us at the above number.     Remember:  Do not eat or drink after midnight the night before your surgery    Take these medicines the morning of surgery with A SIP OF WATER   atorvastatin (LIPITOR)  dorzolamide-timolol (COSOPT)  metoprolol succinate (TOPROL-XL)    Follow your physician's order to stop apixaban (Eliquis) 2 days prior to surgery.  Last dose: 07/04/23.   One week prior to surgery, STOP taking any Aspirin (unless otherwise instructed by your surgeon) Aleve, Naproxen, Ibuprofen, Motrin, Advil, Goody's, BC's, all herbal medications, fish oil, and non-prescription vitamins.                     Do NOT Smoke (Tobacco/Vaping) for 24 hours prior to your procedure.  If you use a CPAP at night, you may bring your mask/headgear for your overnight stay.   You will be asked to remove any contacts, glasses, piercing's, hearing aid's, dentures/partials prior to surgery. Please bring cases for these items if needed.    Patients discharged the day of surgery will not be allowed to drive home, and someone needs to stay with them for 24 hours.  SURGICAL WAITING ROOM VISITATION Patients may have no more than 2 support people in the waiting area - these visitors may rotate.   Pre-op nurse will coordinate an appropriate time for 1 ADULT support person, who may not rotate, to accompany patient in pre-op.  Children under the age of 57 must have an adult with them who is not the patient and must remain in the main waiting area with an  adult.  If the patient needs to stay at the hospital during part of their recovery, the visitor guidelines for inpatient rooms apply.  Please refer to the [facility] website for the visitor guidelines for any additional information.   If you received a COVID test during your pre-op visit  it is requested that you wear a mask when out in public, stay away from anyone that may not be feeling well and notify your surgeon if you develop symptoms. If you have been in contact with anyone that has tested positive in the last 10 days please notify you surgeon.      Pre-operative CHG Bathing Instructions   You can play a key role in reducing the risk of infection after surgery. Your skin needs to be as free of germs as possible. You can reduce the number of germs on your skin by washing with CHG (chlorhexidine gluconate) soap before surgery. CHG is an antiseptic soap that kills germs and continues to kill germs even after washing.   DO NOT use if you have an allergy to chlorhexidine/CHG or antibacterial soaps. If your skin becomes reddened or irritated, stop using the CHG and notify one of our RNs at [phone].              TAKE A SHOWER THE NIGHT BEFORE SURGERY AND THE DAY OF SURGERY    Please keep in mind the following:  DO NOT shave, including legs and underarms,  48 hours prior to surgery.   You may shave your face before/day of surgery.  Place clean sheets on your bed the night before surgery Use a clean washcloth (not used since being washed) for each shower. DO NOT sleep with pet's night before surgery.  CHG Shower Instructions:  Wash your face and private area with normal soap. If you choose to wash your hair, wash first with your normal shampoo.  After you use shampoo/soap, rinse your hair and body thoroughly to remove shampoo/soap residue.  Turn the water OFF and apply half the bottle of CHG soap to a CLEAN washcloth.  Apply CHG soap ONLY FROM YOUR NECK DOWN TO YOUR TOES (washing  for 3-5 minutes)  DO NOT use CHG soap on face, private areas, open wounds, or sores.  Pay special attention to the area where your surgery is being performed.  If you are having back surgery, having someone wash your back for you may be helpful. Wait 2 minutes after CHG soap is applied, then you may rinse off the CHG soap.  Pat dry with a clean towel  Put on clean pajamas    Additional instructions for the day of surgery: DO NOT APPLY any lotions, deodorants, cologne, or perfumes.   Do not wear jewelry or makeup Do not wear nail polish, gel polish, artificial nails, or any other type of covering on natural nails (fingers and toes) Do not bring valuables to the hospital. [facility] is not responsible for valuables/personal belongings. Put on clean/comfortable clothes.  Please brush your teeth.  Ask your nurse before applying any prescription medications to the skin.

## 2023-06-23 ENCOUNTER — Encounter (HOSPITAL_COMMUNITY)
Admission: RE | Admit: 2023-06-23 | Discharge: 2023-06-23 | Disposition: A | Discharge status: Home / Self Care | Payer: Medicare Other | Source: Ambulatory Visit | Attending: Surgery | Admitting: Surgery

## 2023-06-23 ENCOUNTER — Encounter (HOSPITAL_COMMUNITY): Payer: Self-pay | Admitting: *Deleted

## 2023-06-23 ENCOUNTER — Other Ambulatory Visit: Payer: Self-pay

## 2023-06-23 VITALS — BP 109/76 | HR 84 | Temp 98.4°F | Resp 18 | Ht 63.0 in | Wt 235.5 lb

## 2023-06-23 DIAGNOSIS — I6523 Occlusion and stenosis of bilateral carotid arteries: Secondary | ICD-10-CM | POA: Diagnosis not present

## 2023-06-23 DIAGNOSIS — I4891 Unspecified atrial fibrillation: Secondary | ICD-10-CM | POA: Diagnosis not present

## 2023-06-23 DIAGNOSIS — Z87891 Personal history of nicotine dependence: Secondary | ICD-10-CM | POA: Insufficient documentation

## 2023-06-23 DIAGNOSIS — Z01818 Encounter for other preprocedural examination: Secondary | ICD-10-CM | POA: Insufficient documentation

## 2023-06-23 HISTORY — DX: Disorder of arteries and arterioles, unspecified: I77.9

## 2023-06-23 HISTORY — DX: Unspecified atrial fibrillation: I48.91

## 2023-06-23 HISTORY — DX: Cardiac murmur, unspecified: R01.1

## 2023-06-23 HISTORY — DX: Family history of other specified conditions: Z84.89

## 2023-06-23 LAB — TYPE AND SCREEN
ABO/RH(D): O POS
Antibody Screen: NEGATIVE

## 2023-06-23 LAB — URINALYSIS, ROUTINE W REFLEX MICROSCOPIC
Bilirubin Urine: NEGATIVE
Glucose, UA: NEGATIVE mg/dL
Ketones, ur: NEGATIVE mg/dL
Leukocytes,Ua: NEGATIVE
Nitrite: NEGATIVE
Protein, ur: NEGATIVE mg/dL
Specific Gravity, Urine: 1.015 (ref 1.005–1.030)
pH: 6 (ref 5.0–8.0)

## 2023-06-23 LAB — COMPREHENSIVE METABOLIC PANEL WITH GFR
ALT: 29 U/L (ref 0–44)
AST: 25 U/L (ref 15–41)
Albumin: 3.6 g/dL (ref 3.5–5.0)
Alkaline Phosphatase: 67 U/L (ref 38–126)
Anion gap: 12 (ref 5–15)
BUN: 23 mg/dL (ref 8–23)
CO2: 25 mmol/L (ref 22–32)
Calcium: 9.4 mg/dL (ref 8.9–10.3)
Chloride: 100 mmol/L (ref 98–111)
Creatinine, Ser: 0.77 mg/dL (ref 0.44–1.00)
GFR, Estimated: >60 mL/min (ref >60)
Glucose, Bld: 111 mg/dL — ABNORMAL HIGH (ref 70–99)
Potassium: 3.7 mmol/L (ref 3.5–5.1)
Sodium: 137 mmol/L (ref 135–145)
Total Bilirubin: 0.9 mg/dL (ref 0.3–1.2)
Total Protein: 7.1 g/dL (ref 6.5–8.1)

## 2023-06-23 LAB — CBC
HCT: 39 % (ref 36.0–46.0)
Hemoglobin: 11.9 g/dL — ABNORMAL LOW (ref 12.0–15.0)
MCH: 20.2 pg — ABNORMAL LOW (ref 26.0–34.0)
MCHC: 30.5 g/dL (ref 30.0–36.0)
MCV: 66.2 fL — ABNORMAL LOW (ref 80.0–100.0)
Platelets: 341 10*3/uL (ref 150–400)
RBC: 5.89 MIL/uL — ABNORMAL HIGH (ref 3.87–5.11)
RDW: 18.8 % — ABNORMAL HIGH (ref 11.5–15.5)
WBC: 5.7 10*3/uL (ref 4.0–10.5)
nRBC: 0 % (ref 0.0–0.2)

## 2023-06-23 LAB — URINALYSIS, MICROSCOPIC (REFLEX)

## 2023-06-23 LAB — PROTIME-INR
INR: 1.4 — ABNORMAL HIGH (ref 0.8–1.2)
Prothrombin Time: 17.4 s — ABNORMAL HIGH (ref 11.4–15.2)

## 2023-06-23 LAB — SURGICAL PCR SCREEN
MRSA, PCR: NEGATIVE
Staphylococcus aureus: NEGATIVE

## 2023-06-23 LAB — APTT: aPTT: 35 s (ref 24–36)

## 2023-06-23 NOTE — Progress Notes (Signed)
PCP - Dr. Sigmund Hazel Cardiologist - Dr. Nanetta Batty  PPM/ICD - denies   Chest x-ray - 02/28/23 EKG - 06/21/23 Stress Test - 20+ years ago, In [place], normal per pt ECHO - 10/23/22 Cardiac Cath - denies  Sleep Study - OSA+ CPAP - pt is awaiting a CPAP machine (she said she was recently diagnosed with OSA)  DM- denies  Blood Thinner Instructions: Hold Eliquis 2 days. Last dose 9/29 Aspirin Instructions: n/a  ERAS Protcol - no, NPO   COVID TEST- n/a   Anesthesia review: yes, cardiac hx  Patient denies shortness of breath, fever, cough and chest pain at PAT appointment   All instructions explained to the patient, with a verbal understanding of the material. Patient agrees to go over the instructions while at home for a better understanding.  The opportunity to ask questions was provided.

## 2023-06-23 NOTE — Progress Notes (Signed)
PCP - Dr. Sigmund Hazel Cardiologist - Dr. Nanetta Batty   PPM/ICD - denies     Chest x-ray - 02/28/23 EKG - 06/21/23 Stress Test - 20+ years ago, In [place], normal per pt ECHO - 10/23/22 Cardiac Cath - denies   Sleep Study - OSA+ CPAP - pt is awaiting a CPAP machine (she said she was recently diagnosed with OSA)   DM- denies   Blood Thinner Instructions: Hold Eliquis 2 days. Last dose 9/29 Aspirin Instructions: n/a   ERAS Protcol - no, NPO     COVID TEST- n/a     Anesthesia review: yes, cardiac hx, abnormal lab PT/INR   Patient denies shortness of breath, fever, cough and chest pain at PAT appointment     All instructions explained to the patient, with a verbal understanding of the material. Patient agrees to go over the instructions while at home for a better understanding.  The opportunity to ask questions was provided.

## 2023-06-24 ENCOUNTER — Encounter (HOSPITAL_COMMUNITY): Payer: Self-pay | Admitting: Vascular Surgery

## 2023-06-24 NOTE — Progress Notes (Signed)
Anesthesia Chart Review:  Case: [id number] Date/Time: 07/07/23 0815   Procedure: ENDARTERECTOMY CAROTID (Right)   Anesthesia type: General   Pre-op diagnosis: Bilateral carotid artery stenosis   [facility] ROOM 11 / [facility]   Surgeons: Nada Libman, MD       DISCUSSION: Patient is a 77 year old female scheduled for the above procedure.    History includes former smoker (quit 2009), HTN, hypercholesterolemia, RA, rheumatic fever, aortic stenosis (moderate AS 10/2022), afib (diagnosed 01/2023; s/p DCCV 02/25/23, recurrent afib 02/28/23), carotid artery stenosis (left carotid endarterectomy 09/27/19), OSA (awaiting CPAP), anemia, glaucoma. BMI is consistent with morbid obesity.    She had EP visit with Dr. [name] on 05/11/23. Patient felt better after DCCV, but reverted to afib after 3 days. He would like her BMI < 40 before considering afib ablation. He discussed future Tikosyn load. Pharmacist advised she should discuss potentially switching escitalopram to duloxetine due to contraindication with Tikosyn and QT interval prolonging drugs. In regards to cardiac risk assessment for right CEA, Dr. [name] wrote, "Revised cardiac risk index estimates that she is at low risk for cardiovascular complications such as malignant arrhythmia, pulmonary edema, heart attack, or stroke. I anticipate her risk is slightly higher than the estimated 0.9% due to presence of moderate aortic stenosis and diastolic dysfunction. No further testing indicated." Patient wanted to delay right CEA until after her sleep study, which has reported been completed now and CPAP is pending.  Most recently, she had general cardiology following on 0/16/24 with [name], NP. She had been going well fro ma cardiac standpoint since starting Lasix 40 mg daily in June after she had increased dyspnea and LE. She had increased symptoms if she is not compliant. HR was controlled on metoprolol 100 mg daily. She had not had Tikosyn load yet, but  had Afib Clinic visit scheduled for 06/28/23. NP also noted plans for upcoming CEA.  VVS instructed to hold Eliquis for 2 days prior to surgery, last dose planned for 07/04/23. Will plan to follow-up 06/28/23 Afib Clinic notes with Fenton, Clint, PA-C. She also has a pending report from a CT abd/pelvis done on 06/15/23.    VS: BP 109/76   Pulse 84   Temp 36.9 C (Oral)   Resp 18   Ht 5\' 3"  (1.6 m)   Wt 106.8 kg   SpO2 97%   BMI 41.72 kg/m    PROVIDERS: Sigmund Hazel, MD is PCP  Nanetta Batty, MD is cardiologist Mealor, Jari Pigg, MD is EP   LABS: Labs reviewed: Acceptable for surgery. (all labs ordered are listed, but only abnormal results are displayed)  Labs Reviewed  CBC - Abnormal; Notable for the following components:      Result Value   RBC 5.89 (*)    Hemoglobin 11.9 (*)    MCV 66.2 (*)    MCH 20.2 (*)    RDW 18.8 (*)    All other components within normal limits  COMPREHENSIVE METABOLIC PANEL - Abnormal; Notable for the following components:   Glucose, Bld 111 (*)    All other components within normal limits  PROTIME-INR - Abnormal; Notable for the following components:   Prothrombin Time 17.4 (*)    INR 1.4 (*)    All other components within normal limits  URINALYSIS, ROUTINE W REFLEX MICROSCOPIC - Abnormal; Notable for the following components:   Hgb urine dipstick SMALL (*)    All other components within normal limits  URINALYSIS, MICROSCOPIC (REFLEX) - Abnormal;  Notable for the following components:   Bacteria, UA RARE (*)    All other components within normal limits  SURGICAL PCR SCREEN  APTT  TYPE AND SCREEN    Spirometry 03/30/18: FVC 2.72 (105%), FEV1 2.07 (103#), FEV1R 0.76 (100%), FEF25-75 1.15 (66%.).    IMAGES: CT Abd/pelvis 06/15/23: In process.  CTA Head/Neck 04/20/23: IMPRESSION: 1. Aortic atherosclerosis. 2. Advanced calcified plaque at the right carotid bifurcation and proximal ICA bulb. Minimal diameter is only 1.3 cm. Compared to  a more distal cervical ICA diameter of 4 mm, this indicates a 70% stenosis. Beyond that, the cervical ICA is patent to the skull base. 3. Wide patency of the left carotid bifurcation and proximal ICA bulb. Previous carotid endarterectomy. Mild narrowing of the distal ICA bulb, 3.5 mm in diameter, stenosis only 10%. 4. 30% stenosis of the both subclavian arteries proximal to the vertebral artery origins. 30% stenosis of the left vertebral artery origin. 5. No intracranial large vessel occlusion or proximal stenosis.   1V CXR 02/28/23: IMPRESSION: Minimal blunting of the bilateral costophrenic angles, possibly from chronic scarring versus trace pleural fluid. The lungs are clear.   EKG: 06/21/23: Atrial fibrillation at 89 bpm When compared with ECG of 11-May-2023 14:28, No significant change was found Confirmed by [name] ([id number]) on 06/21/2023 10:37:42 AM  CV: US Carotid 10/26/22: Summary:  - Right Carotid: Velocities in the right ICA are consistent with a 80-99% stenosis.  - Left Carotid: Velocities in the left ICA are consistent with a 1-39% stenosis.  - Vertebrals: Bilateral vertebral arteries demonstrate antegrade flow.  - Subclavians: Normal flow hemodynamics were seen in bilateral subclavian arteries.   Echo 10/23/22: IMPRESSIONS   1. Left ventricular ejection fraction, by estimation, is 60 to 65%. The  left ventricle has normal function. The left ventricle has no regional  wall motion abnormalities. There is mild left ventricular hypertrophy.  Left ventricular diastolic parameters  are consistent with Grade I diastolic dysfunction (impaired relaxation).  Elevated left ventricular end-diastolic pressure.   2. Right ventricular systolic function is normal. The right ventricular  size is normal.   3. Left atrial size was moderately dilated.   4. The mitral valve is abnormal. Mild mitral valve regurgitation. No  evidence of mitral stenosis. Moderate mitral annular  calcification.   5. Gradients have increased since TTE done 09/08/21 . The aortic valve is tricuspid. There is moderate calcification of the aortic valve. There is  moderate thickening of the aortic valve. Aortic valve regurgitation is not  visualized. Moderate aortic  valve stenosis. Aortic valve mean gradient measures 19.0 mmHg.  Aortic valve peak gradient measures 35.1 mmHg. Aortic valve area, by VTI measures 1.22 cm.   6. The inferior vena cava is normal in size with greater than 50%  respiratory variability, suggesting right atrial pressure of 3 mmHg.  - Comparison 09/07/21: LVEF 60-65%, no RWMA, mild LVH, normal RVSF, moderately dilated LA, mild MR, AV mean gradient 11.5 mmHg, AV peak gradient 22.2 mmHg, AVA (VTI) 1.73 cm.   Reported a normal stress test in [place] over 20 years ago.  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   TONSILLECTOMY  1963   removed at 77 years old    MEDICATIONS:  apixaban (ELIQUIS) 5 MG TABS tablet   atorvastatin (LIPITOR) 20 MG tablet   calcium elemental as carbonate (BARIATRIC TUMS ULTRA) 400 MG chewable tablet   cyanocobalamin (VITAMIN B12) 1000 MCG tablet   dorzolamide-timolol (COSOPT) 22.3-6.8 MG/ML ophthalmic solution   ELDERBERRY PO   ferrous sulfate 325 (65 FE) MG EC tablet   furosemide (LASIX) 40 MG tablet   GLUCOSAMINE-CHONDROITIN PO   hydrochlorothiazide (HYDRODIURIL) 25 MG tablet   ibuprofen (ADVIL) 600 MG tablet   losartan (COZAAR) 100 MG tablet   metoprolol succinate (TOPROL-XL) 100 MG 24 hr tablet   Multiple Vitamin (MULTIVITAMIN) capsule   Multiple Vitamins-Minerals (AIRBORNE GUMMIES PO)   Vitamin D, Ergocalciferol, (DRISDOL) 1.25 MG (50000 UNIT) CAPS capsule   No current facility-administered medications for this encounter.    Shonna Chock, PA-C Surgical Short Stay/Anesthesiology [facility] Phone [phone] [facility] Phone [phone] 06/24/2023 5:56 PM

## 2023-06-25 ENCOUNTER — Encounter (HOSPITAL_COMMUNITY): Payer: Self-pay

## 2023-06-28 ENCOUNTER — Encounter (HOSPITAL_COMMUNITY): Payer: Self-pay | Admitting: Physician Assistant

## 2023-06-28 ENCOUNTER — Inpatient Hospital Stay (HOSPITAL_COMMUNITY): Admission: AD | Admit: 2023-06-28 | Payer: Medicare Other | Source: Ambulatory Visit | Admitting: *Deleted

## 2023-06-28 ENCOUNTER — Inpatient Hospital Stay (HOSPITAL_COMMUNITY)
Admission: RE | Admit: 2023-06-28 | Discharge: 2023-07-01 | DRG: 309 | Disposition: A | Discharge status: Home / Self Care | Payer: Medicare Other | Source: Ambulatory Visit | Attending: Cardiovascular Disease | Admitting: Cardiovascular Disease

## 2023-06-28 ENCOUNTER — Other Ambulatory Visit: Payer: Self-pay

## 2023-06-28 ENCOUNTER — Other Ambulatory Visit (HOSPITAL_COMMUNITY): Payer: Self-pay

## 2023-06-28 ENCOUNTER — Telehealth: Payer: Self-pay

## 2023-06-28 VITALS — BP 140/70 | HR 92 | Temp 97.9°F | Resp 20 | Ht 63.0 in | Wt 237.0 lb

## 2023-06-28 DIAGNOSIS — Z825 Family history of asthma and other chronic lower respiratory diseases: Secondary | ICD-10-CM

## 2023-06-28 DIAGNOSIS — I10 Essential (primary) hypertension: Secondary | ICD-10-CM | POA: Diagnosis present

## 2023-06-28 DIAGNOSIS — D6869 Other thrombophilia: Secondary | ICD-10-CM | POA: Diagnosis not present

## 2023-06-28 DIAGNOSIS — Z6841 Body Mass Index (BMI) 40.0 and over, adult: Secondary | ICD-10-CM | POA: Diagnosis not present

## 2023-06-28 DIAGNOSIS — I4819 Other persistent atrial fibrillation: Secondary | ICD-10-CM | POA: Diagnosis not present

## 2023-06-28 DIAGNOSIS — Z9049 Acquired absence of other specified parts of digestive tract: Secondary | ICD-10-CM

## 2023-06-28 DIAGNOSIS — Z91048 Other nonmedicinal substance allergy status: Secondary | ICD-10-CM | POA: Diagnosis not present

## 2023-06-28 DIAGNOSIS — Z96652 Presence of left artificial knee joint: Secondary | ICD-10-CM | POA: Diagnosis present

## 2023-06-28 DIAGNOSIS — Z8249 Family history of ischemic heart disease and other diseases of the circulatory system: Secondary | ICD-10-CM

## 2023-06-28 DIAGNOSIS — Z7901 Long term (current) use of anticoagulants: Secondary | ICD-10-CM

## 2023-06-28 DIAGNOSIS — Z880 Allergy status to penicillin: Secondary | ICD-10-CM

## 2023-06-28 DIAGNOSIS — Z79899 Other long term (current) drug therapy: Secondary | ICD-10-CM

## 2023-06-28 DIAGNOSIS — G4733 Obstructive sleep apnea (adult) (pediatric): Secondary | ICD-10-CM | POA: Diagnosis present

## 2023-06-28 DIAGNOSIS — Z841 Family history of disorders of kidney and ureter: Secondary | ICD-10-CM

## 2023-06-28 DIAGNOSIS — E7800 Pure hypercholesterolemia, unspecified: Secondary | ICD-10-CM | POA: Diagnosis not present

## 2023-06-28 LAB — BASIC METABOLIC PANEL
Anion gap: 7 (ref 5–15)
BUN: 22 mg/dL (ref 8–23)
CO2: 28 mmol/L (ref 22–32)
Calcium: 9.3 mg/dL (ref 8.9–10.3)
Chloride: 103 mmol/L (ref 98–111)
Creatinine, Ser: 0.75 mg/dL (ref 0.44–1.00)
GFR, Estimated: >60 mL/min (ref >60)
Glucose, Bld: 94 mg/dL (ref 70–99)
Potassium: 4.7 mmol/L (ref 3.5–5.1)
Sodium: 138 mmol/L (ref 135–145)

## 2023-06-28 LAB — MAGNESIUM: Magnesium: 2.2 mg/dL (ref 1.7–2.4)

## 2023-06-28 MED ORDER — DOFETILIDE 500 MCG PO CAPS
500.0000 ug | ORAL_CAPSULE | Freq: Two times a day (BID) | ORAL | Status: DC
  Administered 2023-06-28 – 2023-07-01 (×6): 500 ug via ORAL
  Filled 2023-06-28 (×6): qty 1

## 2023-06-28 MED ORDER — SODIUM CHLORIDE 0.9% FLUSH: 3.0000 mL | INTRAVENOUS | Status: DC | PRN

## 2023-06-28 MED ORDER — SODIUM CHLORIDE 0.9% FLUSH
3.0000 mL | Freq: Two times a day (BID) | INTRAVENOUS | Status: DC
  Administered 2023-06-28 – 2023-06-30 (×5): 3 mL via INTRAVENOUS

## 2023-06-28 MED ORDER — SODIUM CHLORIDE 0.9 % IV SOLN: 250.0000 mL | INTRAVENOUS | Status: DC | PRN

## 2023-06-28 MED ORDER — APIXABAN 5 MG PO TABS
5.0000 mg | ORAL_TABLET | Freq: Two times a day (BID) | ORAL | Status: DC
  Administered 2023-06-28 – 2023-07-01 (×6): 5 mg via ORAL
  Filled 2023-06-28 (×6): qty 1

## 2023-06-28 NOTE — Progress Notes (Signed)
Primary Care Physician: Sigmund Hazel, MD Primary Cardiologist: Dr. Allyson Sabal Primary Electrophysiologist: Dr [name]  Referring Physician: [name], NP   Kristina [name] is a 77 y.o. female with a history of aortic stenosis, aortic atherosclerosis, carotid artery disease, HTN, HLD, OSA no longer on CPAP, and persistent atrial fibrillation who presents for consultation in the [facility]. She underwent successful DCCV on 02/25/23 with ERAF several days later. Patient went to ED on 5/26 due to Afib with HR 101. Patient is on Eliquis 5 mg BID for a CHADS2VASC score of 5.  On follow up today, patient remains in rate controlled afib. She initially had SOB and fatigue but this improved with better rate control. No missed doses of anticoagulation in the past 3 weeks.   Today, she denies symptoms of palpitations, chest pain, orthopnea, PND, lower extremity edema, dizziness, presyncope, syncope, snoring, daytime somnolence, bleeding, or neurologic sequela. The patient is tolerating medications without difficulties and is otherwise without complaint today.    she has a BMI of Body mass index is 41.7 kg/m.[name] Filed Weights   06/28/23 1117  Weight: 106.8 kg    Family History  Problem Relation Age of Onset   Kidney disease Mother    Hypertension Mother    Glaucoma Mother    Heart failure Father    Emphysema Father    Asthma Father    Hypertension Father    Heart disease Father    COPD Brother    Allergic rhinitis Neg Hx    Angioedema Neg Hx    Atopy Neg Hx    Eczema Neg Hx    Immunodeficiency Neg Hx    Urticaria Neg Hx     Atrial Fibrillation Management history:  Previous antiarrhythmic drugs: None Previous cardioversions: 02/25/23 Previous ablations: None Anticoagulation history: Eliquis 5 mg BID   Past Medical History:  Diagnosis Date   Allergic rhinitis    Anemia glaucoma   Anxiety    Atrial fibrillation (HCC)    Back pain    Carotid arterial  disease (HCC)    Cataract    Mixed OU   DDD (degenerative disc disease), lumbar    Family history of adverse reaction to anesthesia    daughter has PONV   Heart murmur    ECHO 10/23/22   High blood pressure    High cholesterol    History of stomach ulcers    Hypertensive retinopathy    OU   Rheumatic fever    Rheumatic fever    Rheumatoid arteritis (HCC)    Past Surgical History:  Procedure Laterality Date   APPENDECTOMY  1955   CARDIOVERSION N/A 02/25/2023   Procedure: CARDIOVERSION;  Surgeon: Jodelle Red, MD;  Location: [facility] INVASIVE CV LAB;  Service: Cardiovascular;  Laterality: N/A;   CHOLECYSTECTOMY  2006   COLONOSCOPY  2023   DILATATION & CURETTAGE/HYSTEROSCOPY WITH MYOSURE N/A 04/15/2022   Procedure: DILATATION & CURETTAGE/HYSTEROSCOPY WITH MYOSURE;  Surgeon: Gerald Leitz, MD;  Location: [facility] OR;  Service: Gynecology;  Laterality: N/A;   ENDARTERECTOMY Left 09/27/2019   Procedure: ENDARTERECTOMY CAROTID LEFT;  Surgeon: Nada Libman, MD;  Location: [facility] OR;  Service: Vascular;  Laterality: Left;   HERNIA REPAIR  2007   incisional hernia after cholecystecomy   HERNIA REPAIR  2015   incisional hernia   PATCH ANGIOPLASTY Left 09/27/2019   Procedure: Patch Angioplasty Using Livia Snellen;  Surgeon: Nada Libman, MD;  Location: [facility] OR;  Service: Vascular;  Laterality: Left;  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 Pt. Was given samples from her PCP enough to last one month 180 tablet 3   atorvastatin (LIPITOR) 20 MG tablet Take 20 mg by mouth every morning.     calcium elemental as carbonate (BARIATRIC TUMS ULTRA) 400 MG chewable tablet Chew 1,000 mg by mouth daily.     cyanocobalamin (VITAMIN B12) 1000 MCG tablet Take 1,000 mcg by mouth daily.      dorzolamide-timolol (COSOPT) 22.3-6.8 MG/ML ophthalmic solution Place 1 drop into both eyes 2 (two) times daily. 10 mL 4   ELDERBERRY PO Take 2 tablets by mouth daily. Airborne Immune gummy     ferrous sulfate 325 (65 FE) MG EC tablet Take 325 mg by mouth daily.     furosemide (LASIX) 40 MG tablet Take 40 mg by mouth daily as needed. For weight gain or SOB     GLUCOSAMINE-CHONDROITIN PO Take 1,500 mg by mouth daily.     ibuprofen (ADVIL) 600 MG tablet Take 1 tablet (600 mg total) by mouth every 6 (six) hours as needed. 30 tablet 0   losartan (COZAAR) 100 MG tablet Take 100 mg by mouth daily.     metoprolol succinate (TOPROL-XL) 100 MG 24 hr tablet Take 100 mg by mouth daily. Take with or immediately following a meal.     Multiple Vitamin (MULTIVITAMIN) capsule Take 1 capsule by mouth daily. Vita fusion     Multiple Vitamins-Minerals (AIRBORNE GUMMIES PO) Take 3 tablets by mouth daily. immune system     Vitamin D, Ergocalciferol, (DRISDOL) 1.25 MG (50000 UNIT) CAPS capsule Take 1 capsule (50,000 Units total) by mouth every 7 (seven) days. 4 capsule 0   No current facility-administered medications for this encounter.    ROS- All systems are reviewed and negative except as per the HPI above.  Physical Exam: Vitals:   06/28/23 1117  BP: 102/82  Pulse: 85  Weight: 106.8 kg  Height: 5\' 3"  (1.6 m)    GEN: Well nourished, well developed in no acute distress NECK: No JVD; No carotid bruits CARDIAC: Irregularly irregular rate and rhythm, no murmurs, rubs, gallops RESPIRATORY:  Clear to auscultation without rales, wheezing or rhonchi  ABDOMEN: Soft, non-tender, non-distended EXTREMITIES:  No edema; No deformity    Wt Readings from Last 3 Encounters:  06/28/23 106.8 kg  06/23/23 106.8 kg  06/21/23 109.7 kg    EKG today demonstrates  Afib Vent. rate 85 BPM PR interval * ms QRS duration 84 ms QT/QTcB 370/440 ms  Echo 10/23/22 demonstrated: 1. Left ventricular ejection fraction, by  estimation, is 60 to 65%. The  left ventricle has normal function. The left ventricle has no regional  wall motion abnormalities. There is mild left ventricular hypertrophy.  Left ventricular diastolic parameters  are consistent with Grade I diastolic dysfunction (impaired relaxation).  Elevated left ventricular end-diastolic pressure.   2. Right ventricular systolic function is normal. The right ventricular  size is normal.   3. Left atrial size was moderately dilated.   4. The mitral valve is abnormal. Mild mitral valve regurgitation. No  evidence of mitral stenosis. Moderate mitral annular calcification.   5. Gradients have increased since TTE done 09/08/21 . The aortic valve is  tricuspid. There is moderate calcification of  the aortic valve. There is  moderate thickening of the aortic valve. Aortic valve regurgitation is not  visualized. Moderate aortic  valve stenosis.   6. The inferior vena cava is normal in size with greater than 50%  respiratory variability, suggesting right atrial pressure of 3 mmHg.   Epic records are reviewed at length today.  CHA2DS2-VASc Score = 5  [name] score is based upon: CHF History: 0 HTN History: 1 Diabetes History: 0 Stroke History: 0 Vascular Disease History: 1 Age Score: 2 Gender Score: 1       ASSESSMENT AND PLAN: Persistent Atrial Fibrillation (ICD10:  I48.19) [name] CHA2DS2-VASc score is 5, indicating a 7.2% annual risk of stroke.   Patient presents for dofetilide admission. Continue Eliquis 5 mg BID, states no missed doses in the last 3 weeks. No recent benadryl use PharmD has screened medications, she is off escitalopram.  QTc in SR 430 ms Labs today show creatinine at 0.75, K+ 4.7 and mag 2.2, CrCl calculated at 106 mL/min Continue Toprol 100 mg daily  Secondary Hypercoagulable State (ICD10:  D68.69) The patient is at significant risk for stroke/thromboembolism based upon her CHA2DS2-VASc Score of 5.  Continue  Apixaban (Eliquis).  No missed doses.  Obesity Body mass index is 41.7 kg/m.  Encouraged lifestyle modification  OSA  Waiting for CPAP   To be admitted later today once a bed becomes available.   Jorja Loa PA-C Afib Clinic [facility] [address] [place], [place] [address] [phone] 06/28/2023 11:30 AM

## 2023-06-28 NOTE — TOC CM/SW Note (Signed)
Transition of Care [facility]) - Inpatient Brief Assessment   Patient Details  Name: Dori Calhoon MRN: [id number] Date of Birth: May 16, 1946  Transition of Care [facility]) CM/SW Contact:    Gala Lewandowsky, RN Phone Number: 06/28/2023, 3:50 PM   Clinical Narrative: Transition of Care Department [facility]) has reviewed the patient. Patient presented for Tikosyn Load. Benefits check submitted for cost. Case Manager will discuss cost and pharmacy of choice as the patient progresses.   Transition of Care Asessment: Insurance and Status: Insurance coverage has been reviewed Patient has primary care physician: Yes Prior/Current Home Services: No current home services Social Determinants of Health Reivew: SDOH reviewed no interventions necessary Readmission risk has been reviewed: Yes Transition of care needs: no transition of care needs at this time

## 2023-06-28 NOTE — H&P (Signed)
Electrophysiology H&P  Note    Primary Care Physician: Sigmund Hazel, MD Primary Cardiologist: Dr. Allyson Sabal Primary Electrophysiologist: Dr [name]  Referring Physician: [name], NP   Kristina [name] is a 77 y.o. female with a history of aortic stenosis, aortic atherosclerosis, carotid artery disease, HTN, HLD, OSA no longer on CPAP, and persistent atrial fibrillation who presents for consultation in the [facility]. She underwent successful DCCV on 02/25/23 with ERAF several days later. Patient went to ED on 5/26 due to Afib with HR 101. Patient is on Eliquis 5 mg BID for a CHADS2VASC score of 5.  On follow up today, patient remains in rate controlled afib. She initially had SOB and fatigue but this improved with better rate control. No missed doses of anticoagulation in the past 3 weeks.   Today, she denies symptoms of palpitations, chest pain, orthopnea, PND, lower extremity edema, dizziness, presyncope, syncope, snoring, daytime somnolence, bleeding, or neurologic sequela. The patient is tolerating medications without difficulties and is otherwise without complaint today.    she has a BMI of Body mass index is 41.7 kg/m.[name] Filed Weights   06/28/23 1117  Weight: 106.8 kg    Family History  Problem Relation Age of Onset   Kidney disease Mother    Hypertension Mother    Glaucoma Mother    Heart failure Father    Emphysema Father    Asthma Father    Hypertension Father    Heart disease Father    COPD Brother    Allergic rhinitis Neg Hx    Angioedema Neg Hx    Atopy Neg Hx    Eczema Neg Hx    Immunodeficiency Neg Hx    Urticaria Neg Hx     Atrial Fibrillation Management history:  Previous antiarrhythmic drugs: None Previous cardioversions: 02/25/23 Previous ablations: None Anticoagulation history: Eliquis 5 mg BID   Past Medical History:  Diagnosis Date   Allergic rhinitis    Anemia glaucoma   Anxiety    Atrial fibrillation (HCC)     Back pain    Carotid arterial disease (HCC)    Cataract    Mixed OU   DDD (degenerative disc disease), lumbar    Family history of adverse reaction to anesthesia    daughter has PONV   Heart murmur    ECHO 10/23/22   High blood pressure    High cholesterol    History of stomach ulcers    Hypertensive retinopathy    OU   Rheumatic fever    Rheumatic fever    Rheumatoid arteritis (HCC)    Past Surgical History:  Procedure Laterality Date   APPENDECTOMY  1955   CARDIOVERSION N/A 02/25/2023   Procedure: CARDIOVERSION;  Surgeon: Jodelle Red, MD;  Location: [facility] INVASIVE CV LAB;  Service: Cardiovascular;  Laterality: N/A;   CHOLECYSTECTOMY  2006   COLONOSCOPY  2023   DILATATION & CURETTAGE/HYSTEROSCOPY WITH MYOSURE N/A 04/15/2022   Procedure: DILATATION & CURETTAGE/HYSTEROSCOPY WITH MYOSURE;  Surgeon: Gerald Leitz, MD;  Location: [facility] OR;  Service: Gynecology;  Laterality: N/A;   ENDARTERECTOMY Left 09/27/2019   Procedure: ENDARTERECTOMY CAROTID LEFT;  Surgeon: Nada Libman, MD;  Location: [facility] OR;  Service: Vascular;  Laterality: Left;   HERNIA REPAIR  2007   incisional hernia after cholecystecomy   HERNIA REPAIR  2015   incisional hernia   PATCH ANGIOPLASTY Left 09/27/2019   Procedure: Patch Angioplasty Using Livia Snellen;  Surgeon: Nada Libman, MD;  Location: [facility] OR;  Service: Vascular;  Laterality: Left;   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   TONSILLECTOMY  1963   removed at 77 years old    No current facility-administered medications for this encounter.    ROS- All systems are reviewed and negative except as per the HPI above.  Physical Exam: Vitals:   06/28/23 1117  BP: 102/82  Pulse: 85  Weight: 106.8 kg  Height: 5\' 3"  (1.6 m)    GEN: Well nourished, well developed in no acute distress NECK: No JVD; No carotid bruits CARDIAC: Irregularly irregular rate and rhythm, no murmurs, rubs, gallops RESPIRATORY:  Clear to auscultation without rales, wheezing  or rhonchi  ABDOMEN: Soft, non-tender, non-distended EXTREMITIES:  No edema; No deformity    Wt Readings from Last 3 Encounters:  06/28/23 106.8 kg  06/23/23 106.8 kg  06/21/23 109.7 kg    EKG today demonstrates  Afib Vent. rate 85 BPM PR interval * ms QRS duration 84 ms QT/QTcB 370/440 ms  Echo 10/23/22 demonstrated: 1. Left ventricular ejection fraction, by estimation, is 60 to 65%. The  left ventricle has normal function. The left ventricle has no regional  wall motion abnormalities. There is mild left ventricular hypertrophy.  Left ventricular diastolic parameters  are consistent with Grade I diastolic dysfunction (impaired relaxation).  Elevated left ventricular end-diastolic pressure.   2. Right ventricular systolic function is normal. The right ventricular  size is normal.   3. Left atrial size was moderately dilated.   4. The mitral valve is abnormal. Mild mitral valve regurgitation. No  evidence of mitral stenosis. Moderate mitral annular calcification.   5. Gradients have increased since TTE done 09/08/21 . The aortic valve is  tricuspid. There is moderate calcification of the aortic valve. There is  moderate thickening of the aortic valve. Aortic valve regurgitation is not  visualized. Moderate aortic  valve stenosis.   6. The inferior vena cava is normal in size with greater than 50%  respiratory variability, suggesting right atrial pressure of 3 mmHg.   Epic records are reviewed at length today.  CHA2DS2-VASc Score = 5  [name] score is based upon: CHF History: 0 HTN History: 1 Diabetes History: 0 Stroke History: 0 Vascular Disease History: 1 Age Score: 2 Gender Score: 1       ASSESSMENT AND PLAN: Persistent Atrial Fibrillation (ICD10:  I48.19) [name] CHA2DS2-VASc score is 5, indicating a 7.2% annual risk of stroke.   Patient presents for dofetilide admission. Continue Eliquis 5 mg BID, states no missed doses in the last 3 weeks. No  recent benadryl use PharmD has screened medications, she is off escitalopram.  QTc in SR 430 ms Labs today show creatinine at 0.75, K+ 4.7 and mag 2.2, CrCl calculated at 106 mL/min Continue Toprol 100 mg daily  Secondary Hypercoagulable State (ICD10:  D68.69) The patient is at significant risk for stroke/thromboembolism based upon her CHA2DS2-VASc Score of 5.  Continue Apixaban (Eliquis).  No missed doses.  Obesity Body mass index is 41.7 kg/m.  Encouraged lifestyle modification  OSA  Waiting for CPAP  Pt presents today for tikosyn as above.   Casimiro Needle [address] [place], [place]  06/28/2023 3:06 PM

## 2023-06-28 NOTE — Progress Notes (Signed)
Patient arrived to room 6E05 in no distress. MD at bedside to see patient.

## 2023-06-28 NOTE — TOC Benefit Eligibility Note (Signed)
Patient Product/process development scientist completed.    The patient is insured through [facility]. Patient has Medicare and is not eligible for a copay card, but may be able to apply for patient assistance, if available.    Ran test claim for dofetilide (Tikosyn) 500 mcg capsules and the current 30 day co-pay is $46.66.   This test claim was processed through [facility]- copay amounts may vary at other pharmacies due to pharmacy/plan contracts, or as the patient moves through the different stages of their insurance plan.     Roland Earl, CPHT Pharmacy Technician III Certified Patient Advocate [facility] Patient Advocate Team Direct Number: [phone]  Fax: [phone]

## 2023-06-28 NOTE — Progress Notes (Signed)
Pharmacy: Dofetilide (Tikosyn) - Initial Consult Assessment and Electrolyte Replacement  Pharmacy consulted to assist in monitoring and replacing electrolytes in this 77 y.o. female admitted on 06/28/2023 undergoing dofetilide initiation.  Assessment:  Patient Exclusion Criteria: If any screening criteria checked as "Yes", then  patient  should NOT receive dofetilide until criteria item is corrected.  If "Yes" please indicate correction plan.  YES  NO Patient  Exclusion Criteria Correction Plan   []   [x]   Baseline QTc interval is greater than or equal to 440 msec. IF above YES box checked dofetilide contraindicated unless patient has ICD; then may proceed if QTc 500-550 msec or with known ventricular conduction abnormalities may proceed with QTc 550-600 msec. QTc = 430 per clinic note    []   [x]   Patient is known or suspected to have a digoxin level greater than 2 ng/ml: No results found for: "DIGOXIN"     []   [x]   Creatinine clearance less than 20 ml/min (calculated using Cockcroft-Gault, actual body weight and serum creatinine): Estimated Creatinine Clearance: 69.2 mL/min (by C-G formula based on SCr of 0.75 mg/dL).     []   [x]  Patient has received drugs known to prolong the QT intervals within the last 48 hours (phenothiazines, tricyclics or tetracyclic antidepressants, erythromycin, H-1 antihistamines, cisapride, fluoroquinolones, azithromycin, ondansetron).   Updated information on QT prolonging agents is available to be searched on the following database:QT prolonging agents     []   [x]   Patient received a dose of hydrochlorothiazide (Oretic) alone or in any combination including triamterene (Dyazide, Maxzide) in the last 48 hours.    []   [x]  Patient received a medication known to increase dofetilide plasma concentrations prior to initial dofetilide dose:  Trimethoprim (Primsol, Proloprim) in the last 36 hours Verapamil (Calan, Verelan) in the last 36 hours or a  sustained release dose in the last 72 hours Megestrol (Megace) in the last 5 days  Cimetidine (Tagamet) in the last 6 hours Ketoconazole (Nizoral) in the last 24 hours Itraconazole (Sporanox) in the last 48 hours  Prochlorperazine (Compazine) in the last 36 hours     []   [x]   Patient is known to have a history of torsades de pointes; congenital or acquired long QT syndromes.    []   [x]   Patient has received a Class 1 antiarrhythmic with less than 2 half-lives since last dose. (Disopyramide, Quinidine, Procainamide, Lidocaine, Mexiletine, Flecainide, Propafenone)    []   [x]   Patient has received amiodarone therapy in the past 3 months or amiodarone level is greater than 0.3 ng/ml.    Labs:    Component Value Date/Time   K 4.7 06/28/2023 1111   MG 2.2 06/28/2023 1111     Plan: Select One Calculated CrCl  Dose q12h  [x]  > 60 ml/min 500 mcg  []  40-60 ml/min 250 mcg  []  20-40 ml/min 125 mcg   [x]   Physician selected initial dose within range recommended for patients level of renal function - will monitor for response.  []   Physician selected initial dose outside of range recommended for patients level of renal function - will discuss if the dose should be altered at this time.   Patient has been appropriately anticoagulated with apixaban.  Potassium: K >/= 4: Appropriate to initiate Tikosyn, no replacement needed    Magnesium: Mg >2: Appropriate to initiate Tikosyn, no replacement needed     Thank you for allowing pharmacy to participate in this patient's care   Harland German, PharmD Clinical Pharmacist **Pharmacist phone directory  can now be found on [email or web address] ([email or web address]).  Listed under [facility].

## 2023-06-28 NOTE — Telephone Encounter (Signed)
Patient is scheduled for a right CEA on 10/2 for asymptomatic right carotid stenosis: CT scan shows at least a 70% stenosis on the right and duplex shows greater than 80%.  She called to inquire if OK for surgery to postponed because she feels it is too close to her Tikosyn initiation.   Per Dr. Myra Gianotti, patient is having a cardioversion and will need to be anticoagulated for a least 4 weeks post procedure. Follow up with her in November to discuss rescheduling surgery.   Spoke with patient while inpatient at [facility]. Informed her of above recommendations per Dr. Myra Gianotti. Patient voiced understanding and agreed for our office to follow up with her in November. States she prefers to be scheduled prior to Thanksgiving, while her daughter is available.

## 2023-06-29 ENCOUNTER — Other Ambulatory Visit: Payer: Self-pay

## 2023-06-29 DIAGNOSIS — I4819 Other persistent atrial fibrillation: Secondary | ICD-10-CM | POA: Diagnosis not present

## 2023-06-29 LAB — BASIC METABOLIC PANEL
Anion gap: 9 (ref 5–15)
BUN: 23 mg/dL (ref 8–23)
CO2: 25 mmol/L (ref 22–32)
Calcium: 8.8 mg/dL — ABNORMAL LOW (ref 8.9–10.3)
Chloride: 102 mmol/L (ref 98–111)
Creatinine, Ser: 0.68 mg/dL (ref 0.44–1.00)
GFR, Estimated: >60 mL/min (ref >60)
Glucose, Bld: 94 mg/dL (ref 70–99)
Potassium: 3.9 mmol/L (ref 3.5–5.1)
Sodium: 136 mmol/L (ref 135–145)

## 2023-06-29 LAB — GLUCOSE, CAPILLARY: Glucose-Capillary: 136 mg/dL — ABNORMAL HIGH (ref 70–99)

## 2023-06-29 LAB — MAGNESIUM: Magnesium: 2 mg/dL (ref 1.7–2.4)

## 2023-06-29 MED ORDER — LOSARTAN POTASSIUM 50 MG PO TABS
100.0000 mg | ORAL_TABLET | Freq: Every day | ORAL | Status: DC
  Administered 2023-06-29 – 2023-07-01 (×3): 100 mg via ORAL
  Filled 2023-06-29 (×3): qty 2

## 2023-06-29 MED ORDER — POTASSIUM CHLORIDE CRYS ER 20 MEQ PO TBCR
40.0000 meq | EXTENDED_RELEASE_TABLET | Freq: Once | ORAL | Status: AC
  Administered 2023-06-29: 40 meq via ORAL
  Filled 2023-06-29: qty 2

## 2023-06-29 MED ORDER — FERROUS SULFATE 325 (65 FE) MG PO TABS
325.0000 mg | ORAL_TABLET | Freq: Every day | ORAL | Status: DC
  Administered 2023-06-29 – 2023-07-01 (×3): 325 mg via ORAL
  Filled 2023-06-29 (×5): qty 1

## 2023-06-29 MED ORDER — ATORVASTATIN CALCIUM 10 MG PO TABS
20.0000 mg | ORAL_TABLET | Freq: Every morning | ORAL | Status: DC
  Administered 2023-06-29 – 2023-07-01 (×3): 20 mg via ORAL
  Filled 2023-06-29 (×3): qty 2

## 2023-06-29 MED ORDER — DORZOLAMIDE HCL-TIMOLOL MAL 2-0.5 % OP SOLN
1.0000 [drp] | Freq: Two times a day (BID) | OPHTHALMIC | Status: DC
  Administered 2023-06-29 – 2023-07-01 (×5): 1 [drp] via OPHTHALMIC
  Filled 2023-06-29: qty 10

## 2023-06-29 MED ORDER — MAGNESIUM SULFATE 2 GM/50ML IV SOLN
2.0000 g | Freq: Once | INTRAVENOUS | Status: AC
  Administered 2023-06-29: 2 g via INTRAVENOUS
  Filled 2023-06-29: qty 50

## 2023-06-29 MED ORDER — METOPROLOL SUCCINATE ER 100 MG PO TB24
100.0000 mg | ORAL_TABLET | Freq: Every day | ORAL | Status: DC
  Administered 2023-06-29: 100 mg via ORAL
  Filled 2023-06-29: qty 1

## 2023-06-29 MED ORDER — VITAMIN B-12 1000 MCG PO TABS
1000.0000 ug | ORAL_TABLET | Freq: Every day | ORAL | Status: DC
  Administered 2023-06-29 – 2023-07-01 (×3): 1000 ug via ORAL
  Filled 2023-06-29 (×3): qty 1

## 2023-06-29 NOTE — Progress Notes (Signed)
This RN and other staff RN's were called into the room by the patients family. Patient had a syncopal episode on the bedside table. Patient did not fall on the floor or hit her head. Patient did have a 9 sec pause on telemetry.[place], PA paged and at the bedside. Vital signs stable. Patient HR remains in the 40's-50's. Patient was instructed by this RN to call for help when she needs to go to the bathroom. Will continue to check on patient.

## 2023-06-29 NOTE — Plan of Care (Signed)
  Problem: Health Behavior/Discharge Planning: Goal: Ability to manage health-related needs will improve Outcome: Progressing   Problem: Clinical Measurements: Goal: Will remain free from infection Outcome: Progressing Goal: Respiratory complications will improve Outcome: Progressing Goal: Cardiovascular complication will be avoided Outcome: Progressing   Problem: Nutrition: Goal: Adequate nutrition will be maintained Outcome: Progressing   Problem: Coping: Goal: Level of anxiety will decrease Outcome: Progressing   Problem: Safety: Goal: Ability to remain free from injury will improve Outcome: Progressing   Problem: Skin Integrity: Goal: Risk for impaired skin integrity will decrease Outcome: Progressing

## 2023-06-29 NOTE — Progress Notes (Signed)
Paged to see pt for syncopal episode  Correlates with 8.9s post conversion pause.    QT remains stable after conversion.     Continue tikosyn 500 mcg BID  Hold toprol for now, can resume at lower dose as needed.    Casimiro Needle [address] [place], PA-C  06/29/2023 1:14 PM

## 2023-06-29 NOTE — Plan of Care (Signed)

## 2023-06-29 NOTE — Progress Notes (Signed)
EKG from yesterday evening 06/28/2023 reviewed     Shows pt remains in afib with stable QTc at ~480 ms, likely better, confounded by flutter waves  Continue  Tikosyn 500 mcg BID.   Potassium3.9 (09/24 0330) Magnesium  2.0 (09/24 0330) Creatinine, ser  0.68 (09/24 0330)  Pt will be NPO after midnight for DCCV if remains in afib   Graciella Freer, PA-C  06/29/2023 11:23 AM

## 2023-06-29 NOTE — Progress Notes (Signed)
Pharmacy: Dofetilide (Tikosyn) - Follow Up Assessment and Electrolyte Replacement  Pharmacy consulted to assist in monitoring and replacing electrolytes in this 77 y.o. female admitted on 06/28/2023 undergoing dofetilide initiation. First dofetilide dose: 9/23 PM  Labs:    Component Value Date/Time   K 3.9 06/29/2023 0330   MG 2.0 06/29/2023 0330     Plan: Potassium: K 3.8-3.9:  Give KCl 40 mEq po x1   Magnesium: Mg 1.8-2: Give Mg 2 gm IV x1    Thank you for allowing pharmacy to participate in this patient's care   Trixie Rude, PharmD Clinical Pharmacist 06/29/2023  7:40 AM

## 2023-06-29 NOTE — Progress Notes (Signed)
   06/29/23 1501  Assess: MEWS Score  Temp 97.6 F (36.4 C)  BP 99/69  MAP (mmHg) 77  Pulse Rate (!) 41  ECG Heart Rate (!) 47  Resp 18  SpO2 94 %  O2 Device Room Air  Assess: MEWS Score  MEWS Temp 0  MEWS Systolic 1  MEWS Pulse 1  MEWS RR 0  MEWS LOC 0  MEWS Score 2  MEWS Score Color Yellow  Assess: if the MEWS score is Yellow or Red  Were vital signs accurate and taken at a resting state? Yes  Does the patient meet 2 or more of the SIRS criteria? No  MEWS guidelines implemented  Yes, yellow  Treat  MEWS Interventions Considered administering scheduled or prn medications/treatments as ordered  Take Vital Signs  Increase Vital Sign Frequency  Yellow: Q2hr x1, continue Q4hrs until patient remains green for 12hrs  Escalate  MEWS: Escalate Yellow: Discuss with charge nurse and consider notifying provider and/or RRT  Notify: Charge Nurse/RN  Name of Charge Nurse/RN Notified Timber Marshman RN  Assess: SIRS CRITERIA  SIRS Temperature  0  SIRS Pulse 0  SIRS Respirations  0  SIRS WBC 0  SIRS Score Sum  0

## 2023-06-29 NOTE — Progress Notes (Cosign Needed Addendum)
   Electrophysiology Rounding Note  Patient Name: Kristina [name] Date of Encounter: 06/29/2023  Primary Cardiologist: Nanetta Batty, MD  Electrophysiologist: Maurice Small, MD    Subjective   Pt remains in afib on Tikosyn 500 mcg BID   QTc from EKG last pm shows stable QTc at ~470-480  The patient is doing well today.  At this time, the patient denies chest pain, shortness of breath, or any new concerns.  Inpatient Medications    Scheduled Meds:  apixaban  5 mg Oral BID   atorvastatin  20 mg Oral q morning   cyanocobalamin  1,000 mcg Oral Daily   dofetilide  500 mcg Oral BID   dorzolamide-timolol  1 drop Both Eyes BID   ferrous sulfate  325 mg Oral Daily   losartan  100 mg Oral Daily   metoprolol succinate  100 mg Oral Daily   potassium chloride  40 mEq Oral Once   sodium chloride flush  3 mL Intravenous Q12H   Continuous Infusions:  sodium chloride     magnesium sulfate bolus IVPB     PRN Meds: sodium chloride, sodium chloride flush   Vital Signs    Vitals:   06/28/23 1533 06/28/23 2000 06/29/23 0609 06/29/23 0734  BP:  119/70 121/79 118/62  Pulse:   89 81  Resp:  19 18 18   Temp:  97.8 F (36.6 C) (!) 97.5 F (36.4 C) 97.6 F (36.4 C)  TempSrc:  Oral Oral Oral  SpO2:  99% 100% 99%  Weight: 107.5 kg     Height: 5\' 3"  (1.6 m)      No intake or output data in the 24 hours ending 06/29/23 0842 Filed Weights   06/28/23 1117 06/28/23 1533  Weight: 106.8 kg 107.5 kg    Physical Exam    GEN- NAD, A&O x 3. Normal affect.  Lungs- CTAB, Normal effort.  Heart- Irregularly irregular rate and rhythm. No M/G/R GI- Soft, NT, ND Extremities- No clubbing, cyanosis, or edema Skin- no rash or lesion  Labs    CBC No results for input(s): "WBC", "NEUTROABS", "HGB", "HCT", "MCV", "PLT" in the last 72 hours. Basic Metabolic Panel Recent Labs    16/10/96 1111 06/29/23 0330  NA 138 136  K 4.7 3.9  CL 103 102  CO2 28 25  GLUCOSE 94 94  BUN 22 23   CREATININE 0.75 0.68  CALCIUM 9.3 8.8*  MG 2.2 2.0    Telemetry    AF 70-90s (personally reviewed)  Patient Profile     Kristina [name] is a 77 y.o. female with a past medical history significant for persistent atrial fibrillation.  They were admitted for tikosyn load.   Assessment & Plan    Persistent atrial fibrillation Pt remains in afib on Tikosyn 500 mcg BID  Continue Eliquis Creatinine, ser  0.68 (09/24 0330) Magnesium  2.0 (09/24 0330) Potassium3.9 (09/24 0330) Supplement K  If pt does not convert chemically, plan on DCCV tomorrow   Obesity Body mass index is 41.98 kg/m.  Encouraged lifestyle modification   For questions or updates, please contact [facility] Please consult [email or web address] for contact info under Cardiology/STEMI.  Dustin Flock, PA-C  06/29/2023, 8:42 AM

## 2023-06-30 ENCOUNTER — Other Ambulatory Visit: Payer: Self-pay

## 2023-06-30 ENCOUNTER — Encounter (HOSPITAL_COMMUNITY)
Admission: RE | Disposition: A | Discharge status: Home / Self Care | Payer: Self-pay | Source: Ambulatory Visit | Attending: Physician Assistant

## 2023-06-30 DIAGNOSIS — I4819 Other persistent atrial fibrillation: Secondary | ICD-10-CM | POA: Diagnosis not present

## 2023-06-30 LAB — BASIC METABOLIC PANEL
Anion gap: 10 (ref 5–15)
BUN: 40 mg/dL — ABNORMAL HIGH (ref 8–23)
CO2: 25 mmol/L (ref 22–32)
Calcium: 9.1 mg/dL (ref 8.9–10.3)
Chloride: 98 mmol/L (ref 98–111)
Creatinine, Ser: 0.98 mg/dL (ref 0.44–1.00)
GFR, Estimated: 59 mL/min — ABNORMAL LOW (ref >60)
Glucose, Bld: 97 mg/dL (ref 70–99)
Potassium: 4.7 mmol/L (ref 3.5–5.1)
Sodium: 133 mmol/L — ABNORMAL LOW (ref 135–145)

## 2023-06-30 LAB — MAGNESIUM: Magnesium: 2.6 mg/dL — ABNORMAL HIGH (ref 1.7–2.4)

## 2023-06-30 SURGERY — CARDIOVERSION
Anesthesia: General

## 2023-06-30 MED ORDER — DOXYLAMINE SUCCINATE (SLEEP) 25 MG PO TABS
25.0000 mg | ORAL_TABLET | Freq: Every evening | ORAL | Status: DC | PRN
  Administered 2023-06-30: 25 mg via ORAL
  Filled 2023-06-30 (×2): qty 1

## 2023-06-30 NOTE — Care Management Important Message (Signed)
Important Message  Patient Details  Name: Kristina [name] MRN: [id number] Date of Birth: [birth year]   Important Message Given:  Yes - Medicare IM     Sherilyn Banker 06/30/2023, 1:48 PM

## 2023-06-30 NOTE — Progress Notes (Signed)
Morning EKG reviewed  Shows remains in sinus bradycardia in the 40s with QTc that corrects to ~ 440 when measured manually and corrected for rate.   Continue  Tikosyn 500 mcg BID.   Potassium4.7 (09/25 0428) Magnesium  2.6* (09/25 0428) Creatinine, ser  0.98 (09/25 0428)  Plan for home Thursday if QTc remains stable   Graciella Freer, [place]  06/30/2023 11:31 AM

## 2023-06-30 NOTE — Care Management (Signed)
1642 06-30-23 Patient presented for Tikosyn Load. Case Manager spoke with the patient regarding co pay cost. Patient is agreeable to cost and would like to have the initial Rx filled via [facility] and the Rx refills escribed to [facility] [place] [facility] [employer]. Patient wants to see if she can get a referral to cardiac rehab. Secure chat submitted to PA to review. Case Manager discussed the option of home health and patient felt like cardiac rehab would be better. No further needs identified at this time.

## 2023-06-30 NOTE — Progress Notes (Signed)
Pharmacy: Dofetilide (Tikosyn) - Follow Up Assessment and Electrolyte Replacement  Pharmacy consulted to assist in monitoring and replacing electrolytes in this 77 y.o. female admitted on 06/28/2023 undergoing dofetilide initiation. First dofetilide dose: 9/23 PM  Labs:    Component Value Date/Time   K 4.7 06/30/2023 0428   MG 2.6 (H) 06/30/2023 0428     Plan: Potassium: K >/= 4: No additional supplementation needed  Magnesium: Mg > 2: No additional supplementation needed   Thank you for allowing pharmacy to participate in this patient's care   Trixie Rude, PharmD Clinical Pharmacist 06/30/2023  7:14 AM

## 2023-06-30 NOTE — Plan of Care (Signed)

## 2023-06-30 NOTE — Progress Notes (Signed)
  Progress Note   Date: 06/30/2023  Patient Name: Kristina [name]        MRN#: [id number]   Clarification of diagnosis:  severe obesity  Body mass index is 41.98 kg/m.

## 2023-06-30 NOTE — Progress Notes (Signed)
   Electrophysiology Rounding Note  Patient Name: Kristina [name] Date of Encounter: 06/30/2023  Primary Cardiologist: Nanetta Batty, MD  Electrophysiologist: Maurice Small, MD    Subjective   Pt remains in NSR on Tikosyn 500 mcg BID   QTc from EKG last pm shows stable QTc at ~450 when measured manually and corrected for rate  The patient is doing well today.  At this time, the patient denies chest pain, shortness of breath, or any new concerns.  Inpatient Medications    Scheduled Meds:  apixaban  5 mg Oral BID   atorvastatin  20 mg Oral q morning   cyanocobalamin  1,000 mcg Oral Daily   dofetilide  500 mcg Oral BID   dorzolamide-timolol  1 drop Both Eyes BID   ferrous sulfate  325 mg Oral Daily   losartan  100 mg Oral Daily   sodium chloride flush  3 mL Intravenous Q12H   Continuous Infusions:  sodium chloride     PRN Meds: sodium chloride, sodium chloride flush   Vital Signs    Vitals:   06/29/23 1300 06/29/23 1501 06/29/23 2021 06/30/23 0449  BP: 124/83 99/69 (!) 109/51 (!) 120/52  Pulse:  (!) 41 (!) 46 (!) 45  Resp: 16 18 18 18   Temp:  97.6 F (36.4 C) (!) 97.5 F (36.4 C) 97.7 F (36.5 C)  TempSrc:  Oral Oral Oral  SpO2: 95% 94% 97% 96%  Weight:      Height:        Intake/Output Summary (Last 24 hours) at 06/30/2023 0740 Last data filed at 06/30/2023 0600 Gross per 24 hour  Intake 1380 ml  Output --  Net 1380 ml   Filed Weights   06/28/23 1117 06/28/23 1533  Weight: 106.8 kg 107.5 kg    Physical Exam    GEN- NAD, A&O x 3. Normal affect.  Lungs- CTAB, Normal effort.  Heart- Regular rate and rhythm. No M/G/R GI- Soft, NT, ND Extremities- No clubbing, cyanosis, or edema Skin- no rash or lesion  Labs    CBC No results for input(s): "WBC", "NEUTROABS", "HGB", "HCT", "MCV", "PLT" in the last 72 hours. Basic Metabolic Panel Recent Labs    21/30/86 0330 06/30/23 0428  NA 136 133*  K 3.9 4.7  CL 102 98  CO2 25 25  GLUCOSE 94 97   BUN 23 40*  CREATININE 0.68 0.98  CALCIUM 8.8* 9.1  MG 2.0 2.6*    Telemetry    Sinus brady/NSR 40-60s (personally reviewed)  Patient Profile     Kristina [name] is a 77 y.o. female with a past medical history significant for persistent atrial fibrillation.  They were admitted for tikosyn load.   Assessment & Plan    Persistent atrial fibrillation Pt remains in NSR on Tikosyn 500 mcg BID  Continue Eliquis Creatinine, ser  0.98 (09/25 0428) Magnesium  2.6* (09/25 0428) Potassium4.7 (09/25 0428) No electrolyte supplementation needed  Plan for home Thursday if QTc remains stable.  Post conversion pause 9s pause yesterday with syncope Leave off toprol and follow rates today.  For questions or updates, please contact [facility] Please consult [email or web address] for contact info under Cardiology/STEMI.  Signed, Graciella Freer, PA-C  06/30/2023, 7:40 AM

## 2023-07-01 ENCOUNTER — Other Ambulatory Visit: Payer: Self-pay

## 2023-07-01 ENCOUNTER — Other Ambulatory Visit (HOSPITAL_COMMUNITY): Payer: Self-pay

## 2023-07-01 DIAGNOSIS — I4819 Other persistent atrial fibrillation: Secondary | ICD-10-CM | POA: Diagnosis not present

## 2023-07-01 LAB — BASIC METABOLIC PANEL
Anion gap: 7 (ref 5–15)
BUN: 36 mg/dL — ABNORMAL HIGH (ref 8–23)
CO2: 24 mmol/L (ref 22–32)
Calcium: 8.9 mg/dL (ref 8.9–10.3)
Chloride: 106 mmol/L (ref 98–111)
Creatinine, Ser: 0.72 mg/dL (ref 0.44–1.00)
GFR, Estimated: >60 mL/min (ref >60)
Glucose, Bld: 87 mg/dL (ref 70–99)
Potassium: 4.4 mmol/L (ref 3.5–5.1)
Sodium: 137 mmol/L (ref 135–145)

## 2023-07-01 LAB — MAGNESIUM: Magnesium: 2.4 mg/dL (ref 1.7–2.4)

## 2023-07-01 MED ORDER — DOFETILIDE 500 MCG PO CAPS
500.0000 ug | ORAL_CAPSULE | Freq: Two times a day (BID) | ORAL | 6 refills | Status: DC
  Filled 2023-07-01: qty 60, 30d supply, fill #0

## 2023-07-01 NOTE — Progress Notes (Signed)
EKG from yesterday evening 06/30/2023 reviewed     Shows remains in NSR at 55 bpm with stable QTc at ~450-460 ms.  Continue  Tikosyn 500 mcg BID.   Potassium4.4 (09/26 0453) Magnesium  2.4 (09/26 0453) Creatinine, ser  0.72 (09/26 0453)  Plan for home this afternoon if QTc remains stable.   Graciella Freer, PA-C  07/01/2023 7:10 AM

## 2023-07-01 NOTE — Discharge Summary (Signed)
ELECTROPHYSIOLOGY DISCHARGE SUMMARY    Patient ID: Kristina [name],  MRN: [id number], DOB/AGE: 1945-11-15 77 y.o.  Admit date: 06/28/2023 Discharge date: 07/01/2023  Primary Care Physician: Kristina Hazel, MD  Primary Cardiologist: Kristina Batty, MD  Electrophysiologist: Dr. Nelly [name]   Primary Discharge Diagnosis:  Persistent atrial fibrillation status post Tikosyn loading this admission  Secondary Discharge Diagnosis:  Post conversion pause Sinus bradycardia  Allergies  Allergen Reactions   Penicillins Anaphylaxis and Itching    Did it involve swelling of the face/tongue/throat, SOB, or low BP? Yes Did it involve sudden or severe rash/hives, skin peeling, or any reaction on the inside of your mouth or nose? No Did you need to seek medical attention at a hospital or doctor's office? Yes When did it last happen?      Childhood allergy If all above answers are "NO", may proceed with cephalosporin use.    Mite (D. Farinae) Swelling    Other reaction(s): sinus swelling (dust mites)     Procedures This Admission:  1.  Tikosyn loading   Brief HPI: Kristina [name] is a 77 y.o. female with a past medical history as noted above.  They were referred to EP for treatment options of atrial fibrillation.  Risks, benefits, and alternatives to Tikosyn were reviewed with the patient who wished to proceed with admission for loading.  Hospital Course:  The patient was admitted and Tikosyn was initiated.  Renal function and electrolytes were followed during the hospitalization.  Their QTc remained stable. The patient converted chemically and did not require cardioversion. The patient had significant post conversion pause of 9 seconds resulting in syncope and subsequent sinus bradycardia in the 40s. This resolved with holding her BB. It will NOT be resumed on discharge. No urgent indication for pacing.   The patients QTc remained stable. They were monitored on telemetry up to discharge. On  the day of discharge, they were examined by Dr. Nelly [name]  who considered them stable for discharge to home.  Follow-up has been arranged with the Atrial Fibrillation clinic in approximately 1 week.   Physical Exam: Vitals:   06/30/23 1935 07/01/23 0442 07/01/23 0700 07/01/23 0900  BP:   (!) 126/50 (!) 104/55  Pulse:  62 (!) 59 64  Resp: 20 20 17 17   Temp: 97.9 F (36.6 C) 97.8 F (36.6 C) (!) 97.4 F (36.3 C) 97.6 F (36.4 C)  TempSrc: Oral Oral Oral Oral  SpO2:   97% 97%  Weight:      Height:        GEN- NAD, A&O x 3. Normal affect.  Lungs- CTAB, Normal effort.  Heart- Regular rate and rhythm. No M/G/R GI- Soft, NT, ND Extremities- No clubbing, cyanosis, or edema Skin- no rash or lesion  Labs:   Lab Results  Component Value Date   WBC 5.7 06/23/2023   HGB 11.9 (L) 06/23/2023   HCT 39.0 06/23/2023   MCV 66.2 (L) 06/23/2023   PLT 341 06/23/2023    Recent Labs  Lab 07/01/23 0453  NA 137  K 4.4  CL 106  CO2 24  BUN 36*  CREATININE 0.72  CALCIUM 8.9  GLUCOSE 87    Discharge Medications:  Allergies as of 07/01/2023       Reactions   Penicillins Anaphylaxis, Itching   Did it involve swelling of the face/tongue/throat, SOB, or low BP? Yes Did it involve sudden or severe rash/hives, skin peeling, or any reaction on the inside of your mouth or nose? No  Did you need to seek medical attention at a hospital or doctor's office? Yes When did it last happen?      Childhood allergy If all above answers are "NO", may proceed with cephalosporin use.   Mite (d. Farinae) Swelling   Other reaction(s): sinus swelling (dust mites)        Medication List     STOP taking these medications    metoprolol succinate 100 MG 24 hr tablet Commonly known as: TOPROL-XL       TAKE these medications    AIRBORNE GUMMIES PO Take 3 tablets by mouth daily. immune system   apixaban 5 MG Tabs tablet Commonly known as: Eliquis Take 1 tablet (5 mg total) by mouth 2 (two) times  daily. Pt. Was given samples from her PCP enough to last one month   atorvastatin 20 MG tablet Commonly known as: LIPITOR Take 20 mg by mouth every morning.   calcium elemental as carbonate 400 MG chewable tablet Commonly known as: BARIATRIC TUMS ULTRA Chew 1,000 mg by mouth daily as needed for heartburn.   cyanocobalamin 1000 MCG tablet Commonly known as: VITAMIN B12 Take 1,000 mcg by mouth daily.   dofetilide 500 MCG capsule Commonly known as: TIKOSYN Take 1 capsule (500 mcg total) by mouth 2 (two) times daily.   dorzolamide-timolol 2-0.5 % ophthalmic solution Commonly known as: COSOPT Place 1 drop into both eyes 2 (two) times daily.   ELDERBERRY PO Take 2 tablets by mouth daily. Airborne Immune gummy   ferrous sulfate 325 (65 FE) MG EC tablet Take 325 mg by mouth daily.   furosemide 40 MG tablet Commonly known as: LASIX Take 40 mg by mouth daily as needed. For weight gain or SOB   GLUCOSAMINE-CHONDROITIN PO Take 1,500 mg by mouth daily.   ibuprofen 600 MG tablet Commonly known as: ADVIL Take 1 tablet (600 mg total) by mouth every 6 (six) hours as needed.   losartan 100 MG tablet Commonly known as: COZAAR Take 100 mg by mouth daily.   multivitamin capsule Take 1 capsule by mouth daily. Vita fusion   Vitamin D (Ergocalciferol) 1.25 MG (50000 UNIT) Caps capsule Commonly known as: DRISDOL Take 1 capsule (50,000 Units total) by mouth every 7 (seven) days.        Disposition:  Home with 1 week follow up in AF clinic as in AVS    Duration of Discharge Encounter: Greater than 30 minutes including physician time.  Kristina Flock, PA-C  07/01/2023 10:36 AM

## 2023-07-01 NOTE — Evaluation (Signed)
Physical Therapy Brief Evaluation and Discharge Note Patient Details Name: Kristina [name] MRN: [id number] DOB: 1946/07/10 Today's Date: 07/01/2023   History of Present Illness  Pt is 77 year old presented to [facility] on  06/28/23 for Tikosyn loading for persistent Afib. PMH - CAD, HTN, afib, aortic stenosis  Clinical Impression  Pt doing well with mobility and no further acute PT needed.  Ready for dc from PT standpoint. Recommend OPPT to work on improving strength and activity tolerance for pt.         PT Assessment All further PT needs can be met in the next venue of care  Assistance Needed at Discharge  PRN    Equipment Recommendations None recommended by PT  Recommendations for Other Services       Precautions/Restrictions Precautions Precautions: Other (comment) Precaution Comments: watch HR        Mobility  Bed Mobility Rolling: Independent Supine/Sidelying to sit: Modified independent (Device/Increased time) Sit to supine/sidelying: Modified independent (Device/Increased time)    Transfers Overall transfer level: Modified independent Equipment used: None                    Ambulation/Gait Ambulation/Gait assistance: Modified independent (Device/Increase time), Supervision Gait Distance (Feet): 225 Feet Assistive device: None Gait Pattern/deviations: Step-through pattern, Decreased stride length Gait Speed: Below normal General Gait Details: Modified independent in small environment. Supervision for more open environment and with fatigue. Distance longer than pt's typical household distance.  Home Activity Instructions Home Activity Instructions: Perform sit to stand repetitions building to 10 reps at breakfast, lunch and dinner  Stairs            Modified Rankin (Stroke Patients Only)        Balance Overall balance assessment: Mild deficits observed, not formally tested                        Pertinent Vitals/Pain PT - Brief  Vital Signs All Vital Signs Stable: Other (comment) (RHR 70's. HR with amb 110's.) Pain Assessment Pain Assessment: No/denies pain     Home Living Family/patient expects to be discharged to:: Private residence Living Arrangements: Alone Available Help at Discharge: Friend(s);Available PRN/intermittently Home Environment: Stairs to enter  Stairs-Number of Steps: 1 Home Equipment: None        Prior Function Level of Independence: Independent      UE/LE Assessment   UE ROM/Strength/Tone/Coordination: WFL    LE ROM/Strength/Tone/Coordination: Generalized weakness      Communication   Communication Communication: No apparent difficulties     Cognition Overall Cognitive Status: Appears within functional limits for tasks assessed/performed       General Comments      Exercises     Assessment/Plan    PT Problem List Decreased strength;Decreased balance       PT Visit Diagnosis Muscle weakness (generalized) (M62.81)    No Skilled PT     Co-evaluation                AMPAC 6 Clicks Help needed turning from your back to your side while in a flat bed without using bedrails?: None Help needed moving from lying on your back to sitting on the side of a flat bed without using bedrails?: None Help needed moving to and from a bed to a chair (including a wheelchair)?: None Help needed standing up from a chair using your arms (e.g., wheelchair or bedside chair)?: None Help needed to walk in hospital room?: None  Help needed climbing 3-5 steps with a railing? : A Little 6 Click Score: 23      End of Session   Activity Tolerance: Patient tolerated treatment well Patient left: in bed;with call bell/phone within reach Nurse Communication: Mobility status PT Visit Diagnosis: Muscle weakness (generalized) (M62.81)     Time: 7829-5621 PT Time Calculation (min) (ACUTE ONLY): 9 min  Charges:   PT Evaluation $PT Eval Low Complexity: 1 Low      [facility]  PT Acute Rehabilitation Services Office [phone]   Angelina Ok [facility]  07/01/2023, 9:00 AM

## 2023-07-01 NOTE — Discharge Instructions (Signed)
Dofetilide Capsules What is this medication? DOFETILIDE (doe FET il ide) treats a fast or irregular heartbeat (arrhythmia). It works by slowing down overactive electric signals in the heart, which stabilizes your heart rhythm. It belongs to a group of medications called antiarrhythmics. This medicine may be used for other purposes; ask your health care provider or pharmacist if you have questions. COMMON BRAND NAME(S): Tikosyn What should I tell my care team before I take this medication? They need to know if you have any of these conditions: Heart disease History of irregular heartbeat History of low levels of potassium or magnesium in the blood Kidney disease Liver disease An unusual or allergic reaction to dofetilide, other medications, foods, dyes, or preservatives Pregnant or trying to get pregnant Breast-feeding How should I use this medication? Take this medication by mouth with a glass of water. Follow the directions on the prescription label. Do not take with grapefruit juice. You can take it with or without food. If it upsets your stomach, take it with food. Take your medication at regular intervals. Do not take it more often than directed. Do not stop taking except on your care team's advice. A special MedGuide will be given to you by the pharmacist with each prescription and refill. Be sure to read this information carefully each time. Talk to your care team about the use of this medication in children. Special care may be needed. Overdosage: If you think you have taken too much of this medicine contact a poison control center or emergency room at once. NOTE: This medicine is only for you. Do not share this medicine with others. What if I miss a dose? If you miss a dose, skip it. Take your next dose at the normal time. Do not take extra or 2 doses at the same time to make up for the missed dose. What may interact with this medication? Do not take this medication with any of the  following: Benadryl (Diphenhydramine) Cimetidine Cisapride Dolutegravir Dronedarone Erdafitinib Hydrochlorothiazide Immodium Ketoconazole Megestrol Pimozide Prochlorperazine Thioridazine Trimethoprim Verapamil This medication may also interact with the following: Amiloride Cannabinoids Certain antibiotics like erythromycin or clarithromycin Certain antiviral medications for HIV or hepatitis Certain medications for depression, anxiety, or psychotic disorders Digoxin Diltiazem Grapefruit juice Metformin Nefazodone Other medications that prolong the QT interval (an abnormal heart rhythm) Quinine Triamterene Zafirlukast Ziprasidone This list may not describe all possible interactions. Give your health care provider a list of all the medicines, herbs, non-prescription drugs, or dietary supplements you use. Also tell them if you smoke, drink alcohol, or use illegal drugs. Some items may interact with your medicine. What should I watch for while using this medication? Your condition will be monitored carefully while you are receiving this medication. What side effects may I notice from receiving this medication? Side effects that you should report to your care team as soon as possible: Allergic reactions--skin rash, itching, hives, swelling of the face, lips, tongue, or throat Chest pain Heart rhythm changes--fast or irregular heartbeat, dizziness, feeling faint or lightheaded, chest pain, trouble breathing Side effects that usually do not require medical attention (report to your care team if they continue or are bothersome): Dizziness Headache Nausea Stomach pain Trouble sleeping This list may not describe all possible side effects. Call your doctor for medical advice about side effects. You may report side effects to FDA at 1-800-FDA-1088. Where should I keep my medication? Keep out of the reach of children. Store at room temperature between 15 and 30 degrees  C (59 and 86  degrees F). Throw away any unused medication after the expiration date. NOTE: This sheet is a summary. It may not cover all possible information. If you have questions about this medicine, talk to your doctor, pharmacist, or health care provider.  2024 Elsevier/Gold Standard (2021-08-22 00:00:00)

## 2023-07-01 NOTE — Plan of Care (Signed)

## 2023-07-01 NOTE — Care Management (Signed)
07-01-23 1156 Ambulatory referral for outpatient physical therapy submitted to [facility] location. No further needs identified at this time.

## 2023-07-01 NOTE — Progress Notes (Signed)
Pharmacy: Dofetilide (Tikosyn) - Follow Up Assessment and Electrolyte Replacement  Pharmacy consulted to assist in monitoring and replacing electrolytes in this 77 y.o. female admitted on 06/28/2023 undergoing dofetilide initiation.   Labs:    Component Value Date/Time   K 4.4 07/01/2023 0453   MG 2.4 07/01/2023 0453     Plan: Potassium: K >/= 4: No additional supplementation needed  Magnesium: Mg > 2: No additional supplementation needed  -She has only needed a total of potassium this admission. No potassium supplementation recommended at discharge  Thank you for allowing pharmacy to participate in this patient's care   Harland German, PharmD Clinical Pharmacist **Pharmacist phone directory can now be found on [email or web address] ([email or web address]).  Listed under [facility].

## 2023-07-01 NOTE — Plan of Care (Signed)
  Problem: Education: Goal: Knowledge of General Education information will improve Description: Including pain rating scale, medication(s)/side effects and non-pharmacologic comfort measures 07/01/2023 1052 by Herma Carson, RN Outcome: Adequate for Discharge 07/01/2023 0738 by Herma Carson, RN Outcome: Progressing   Problem: Health Behavior/Discharge Planning: Goal: Ability to manage health-related needs will improve 07/01/2023 1052 by Herma Carson, RN Outcome: Adequate for Discharge 07/01/2023 0738 by Herma Carson, RN Outcome: Progressing   Problem: Clinical Measurements: Goal: Ability to maintain clinical measurements within normal limits will improve 07/01/2023 1052 by Herma Carson, RN Outcome: Adequate for Discharge 07/01/2023 0738 by Herma Carson, RN Outcome: Progressing Goal: Will remain free from infection 07/01/2023 1052 by Herma Carson, RN Outcome: Adequate for Discharge 07/01/2023 0738 by Herma Carson, RN Outcome: Progressing Goal: Diagnostic test results will improve 07/01/2023 1052 by Herma Carson, RN Outcome: Adequate for Discharge 07/01/2023 0738 by Herma Carson, RN Outcome: Progressing Goal: Respiratory complications will improve 07/01/2023 1052 by Herma Carson, RN Outcome: Adequate for Discharge 07/01/2023 0738 by Herma Carson, RN Outcome: Progressing Goal: Cardiovascular complication will be avoided 07/01/2023 1052 by Herma Carson, RN Outcome: Adequate for Discharge 07/01/2023 1324 by Herma Carson, RN Outcome: Progressing   Problem: Activity: Goal: Risk for activity intolerance will decrease 07/01/2023 1052 by Herma Carson, RN Outcome: Adequate for Discharge 07/01/2023 4010 by Herma Carson, RN Outcome: Progressing   Problem: Nutrition: Goal: Adequate nutrition will be maintained 07/01/2023 1052 by Herma Carson, RN Outcome: Adequate for Discharge 07/01/2023 0738 by Herma Carson, RN Outcome: Progressing    Problem: Coping: Goal: Level of anxiety will decrease 07/01/2023 1052 by Herma Carson, RN Outcome: Adequate for Discharge 07/01/2023 0738 by Herma Carson, RN Outcome: Progressing   Problem: Elimination: Goal: Will not experience complications related to bowel motility 07/01/2023 1052 by Herma Carson, RN Outcome: Adequate for Discharge 07/01/2023 0738 by Herma Carson, RN Outcome: Progressing Goal: Will not experience complications related to urinary retention 07/01/2023 1052 by Herma Carson, RN Outcome: Adequate for Discharge 07/01/2023 0738 by Herma Carson, RN Outcome: Progressing   Problem: Pain Managment: Goal: General experience of comfort will improve 07/01/2023 1052 by Herma Carson, RN Outcome: Adequate for Discharge 07/01/2023 0738 by Herma Carson, RN Outcome: Progressing   Problem: Safety: Goal: Ability to remain free from injury will improve 07/01/2023 1052 by Herma Carson, RN Outcome: Adequate for Discharge 07/01/2023 0738 by Herma Carson, RN Outcome: Progressing   Problem: Skin Integrity: Goal: Risk for impaired skin integrity will decrease 07/01/2023 1052 by Herma Carson, RN Outcome: Adequate for Discharge 07/01/2023 2725 by Herma Carson, RN Outcome: Progressing

## 2023-07-07 ENCOUNTER — Other Ambulatory Visit: Payer: Self-pay

## 2023-07-07 ENCOUNTER — Ambulatory Visit: Payer: Medicare Other | Attending: Cardiovascular Disease

## 2023-07-07 ENCOUNTER — Inpatient Hospital Stay (HOSPITAL_COMMUNITY): Admission: RE | Admit: 2023-07-07 | Payer: Medicare Other | Source: Ambulatory Visit | Admitting: Surgery

## 2023-07-07 DIAGNOSIS — I4819 Other persistent atrial fibrillation: Secondary | ICD-10-CM | POA: Diagnosis not present

## 2023-07-07 DIAGNOSIS — R2689 Other abnormalities of gait and mobility: Secondary | ICD-10-CM | POA: Diagnosis not present

## 2023-07-07 DIAGNOSIS — M6281 Muscle weakness (generalized): Secondary | ICD-10-CM | POA: Diagnosis not present

## 2023-07-07 DIAGNOSIS — R2681 Unsteadiness on feet: Secondary | ICD-10-CM | POA: Diagnosis not present

## 2023-07-07 SURGERY — ENDARTERECTOMY, CAROTID
Anesthesia: General | Laterality: Right

## 2023-07-07 NOTE — Therapy (Signed)
OUTPATIENT PHYSICAL THERAPY NEURO EVALUATION   Patient Name: Kristina [name] MRN: [id number] DOB:1946-07-16, 77 y.o., female Today's Date: 07/08/2023   PCP: Sigmund Hazel, MD REFERRING PROVIDER: Mealor, Roberts Gaudy, MD  END OF SESSION:  PT End of Session - 07/07/23 0759     Visit Number 1    Number of Visits 8    Date for PT Re-Evaluation 08/18/23    Authorization Type [employer]    PT Start Time 0800    PT Stop Time 0845    PT Time Calculation (min) 45 min             Past Medical History:  Diagnosis Date   Allergic rhinitis    Anemia glaucoma   Anxiety    Atrial fibrillation (HCC)    Back pain    Carotid arterial disease (HCC)    Cataract    Mixed OU   DDD (degenerative disc disease), lumbar    Family history of adverse reaction to anesthesia    daughter has PONV   Heart murmur    ECHO 10/23/22   High blood pressure    High cholesterol    History of stomach ulcers    Hypertensive retinopathy    OU   Rheumatic fever    Rheumatic fever    Rheumatoid arteritis (HCC)    Past Surgical History:  Procedure Laterality Date   APPENDECTOMY  1955   CARDIOVERSION N/A 02/25/2023   Procedure: CARDIOVERSION;  Surgeon: Jodelle Red, MD;  Location: [facility] INVASIVE CV LAB;  Service: Cardiovascular;  Laterality: N/A;   CHOLECYSTECTOMY  2006   COLONOSCOPY  2023   DILATATION & CURETTAGE/HYSTEROSCOPY WITH MYOSURE N/A 04/15/2022   Procedure: DILATATION & CURETTAGE/HYSTEROSCOPY WITH MYOSURE;  Surgeon: Gerald Leitz, MD;  Location: [facility] OR;  Service: Gynecology;  Laterality: N/A;   ENDARTERECTOMY Left 09/27/2019   Procedure: ENDARTERECTOMY CAROTID LEFT;  Surgeon: Nada Libman, MD;  Location: [facility] OR;  Service: Vascular;  Laterality: Left;   HERNIA REPAIR  2007   incisional hernia after cholecystecomy   HERNIA REPAIR  2015   incisional hernia   PATCH ANGIOPLASTY Left 09/27/2019   Procedure: Patch Angioplasty Using Livia Snellen;  Surgeon: Nada Libman, MD;   Location: [facility] OR;  Service: Vascular;  Laterality: Left;   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   TONSILLECTOMY  1963   removed at 77 years old   Patient Active Problem List   Diagnosis Date Noted   Persistent atrial fibrillation (HCC) 03/09/2023   Hypercoagulable state due to persistent atrial fibrillation (HCC) 03/09/2023   Postmenopausal bleeding 04/15/2022   Endometrial polyp 04/15/2022   Prediabetes 10/27/2021   Anxiety 01/10/2021   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   Degenerative joint disease involving multiple joints 01/10/2021   Other allergy status, other than to drugs and biological substances 01/10/2021   Personal history of transient ischemic attack (TIA), and cerebral infarction without residual deficits 01/10/2021   Pure hypercholesterolemia 01/10/2021   Unspecified urinary incontinence 01/10/2021   Visual loss 01/10/2021   Vitamin D deficiency, unspecified 01/10/2021   Aortic stenosis 12/22/2019   Left carotid artery stenosis 09/27/2019   Carotid artery disease (HCC) 09/22/2019   Class 3 severe obesity with serious comorbidity and body mass index (BMI) of 40.0 to 44.9 in adult [facility]) 12/15/2018   Chronic back pain 11/21/2018   Hyperlipidemia 02/24/2017   Essential hypertension 02/24/2017    ONSET DATE: 06/28/23  REFERRING DIAG: I48.19 (ICD-10-CM) - Persistent atrial fibrillation (HCC)  THERAPY DIAG:  Muscle weakness (  generalized) - Plan: PT plan of care cert/re-cert  Other abnormalities of gait and mobility - Plan: PT plan of care cert/re-cert  Unsteadiness on feet - Plan: PT plan of care cert/re-cert  Rationale for Evaluation and Treatment: Rehabilitation  SUBJECTIVE:                                                                                                                                                                                             SUBJECTIVE STATEMENT: Persistent hx of A-fib with recent hospitalization for Tikosyn. Reports very  limited endurance due to prolonged hx of A-fib and currently unable to perform housekeeping, fitness program, and dog walking.  Notes overall feeling improved since D/C but still limited in activity.  Reports able to perform housekeeping but requiring numerous breaks during tasks. Reports chronic LBP Pt accompanied by: self  PERTINENT HISTORY: 77 y.o. female with a history of aortic stenosis, aortic atherosclerosis, carotid artery disease, HTN, HLD, OSA no longer on CPAP, and persistent atrial fibrillation, L TKR, R knee OA.Recent hospital admission for Tikosyn loading  PAIN:  Are you having pain? Yes: NPRS scale: 2-3/10 Pain location: low back. midline Pain description: ache, sore Aggravating factors: worse in AM Relieving factors: improves with activity  PRECAUTIONS: None  RED FLAGS: None   WEIGHT BEARING RESTRICTIONS: No  FALLS: Has patient fallen in last 6 months? Yes. Number of falls 1, tripped  LIVING ENVIRONMENT: Lives with: lives alone Lives in: House/apartment Stairs: Yes: Internal: flight, not required to go up steps; bilateral but cannot reach both and External: 1 steps; none Has following equipment at home: Walker - 4 wheeled  PLOF: Independent  PATIENT GOALS: improve endurance, return to [employer]  OBJECTIVE:  Note: Objective measures were completed at Evaluation unless otherwise noted.  VITALS: 95%, 74 bpm, 114/70 mmHg  DIAGNOSTIC FINDINGS: EKG during hospitalization  COGNITION: Overall cognitive status: Within functional limits for tasks assessed   SENSATION: WFL  COORDINATION: NL  EDEMA:  none  MUSCLE TONE: WNL    DTRs:  NT  POSTURE: No Significant postural limitations  LOWER EXTREMITY ROM:     WNL  LOWER EXTREMITY MMT:    Grossly 4/5  BED MOBILITY:  Indep  TRANSFERS: Assistive device utilized: None  Sit to stand: Complete Independence Stand to sit: Complete Independence Chair to chair: Complete  Independence Floor:  NT   CURB:  Level of Assistance: Complete Independence Assistive device utilized: None Curb Comments: requires to stop and position  STAIRS: Level of Assistance: Modified independence Stair Negotiation Technique: Step to Pattern with Bilateral Rails   GAIT: Gait pattern:  lateral trunk displacement in  stance due to LBP Distance walked: 300 ft Assistive device utilized: None Level of assistance: Complete Independence Comments:   FUNCTIONAL TESTS:  5 times sit to stand: 25 sec 2 minute walk test: 300 ft Vitals pre-test: see above Vitals post-test: 98%, 79 bpm, 161/85 mmHg Rates 13/20 Borg RPE  M-CTSIB  Condition 1: Firm Surface, EO 30 Sec, Mild Sway  Condition 2: Firm Surface, EC 30 Sec, Mild and Moderate Sway  Condition 3: Foam Surface, EO 30 Sec, Mild Sway  Condition 4: Foam Surface, EC 20 Sec, Mild and Moderate Sway       TODAY'S TREATMENT:                                                                                                                              DATE: 07/07/23    PATIENT EDUCATION: Education details: assessment details, rationale of PT intervention, HEP initiated Person educated: Patient Education method: Explanation and Handouts Education comprehension: verbalized understanding  HOME EXERCISE PROGRAM: Access Code: [email or web address] URL: [email or web address] Date: 07/07/2023 Prepared by: Shary Decamp  Exercises - Seated Heel Toe Raises  - 1 x daily - 7 x weekly - 3 sets - 10 reps - Seated Long Arc Quad  - 1 x daily - 7 x weekly - 3 sets - 10 reps - Seated Hip Adduction Squeeze with Ball  - 1 x daily - 7 x weekly - 3 sets - 10 reps - 2 sec hold - Mini Squat with Counter Support  - 1 x daily - 7 x weekly - 5 sets - 5 reps  GOALS: Goals reviewed with patient? Yes  SHORT TERM GOALS: Target date: 07/28/2023    Patient will be independent in HEP to improve functional outcomes Baseline: Goal status: INITIAL  2.   Demo improved LE strength and reduced risk for falls per 15 sec 5xSTS Test Baseline: 25 sec Goal status: INITIAL    LONG TERM GOALS: Target date: 08/18/2023    Reports ability to resume Silver Sneakers program at local facility Baseline:  Goal status: INITIAL  2.  Demo improved cardiovascular endurance and gait speed per distance of 450 ft during 2 MWT Baseline: 300 ft, 13/20 Borg RPE Goal status: INITIAL  3.  Demo improved postural stability per mild sway x 30 sec condition 4 M-CTSIB Baseline: 20 sec mild-mod Goal status: INITIAL    ASSESSMENT:  CLINICAL IMPRESSION: Patient is a 77 y.o. lady who was seen today for physical therapy evaluation and treatment for persistent A-fib recently hospitalized.  Pt endorses general deconditioning due to prolonged episode.  Demonstrates generalized weakness per 5xSTS test and postural instability per M-CTSIB.  Decreased ambulation tolerance/velocity as revealed by below average of age/condition-matched peers.  Pt would benefit from PT services to improve functional activity tolerance and address deficits/limitations to restore capabilities to PLOF   OBJECTIVE IMPAIRMENTS: Abnormal gait, cardiopulmonary status limiting activity, decreased activity tolerance, decreased balance, decreased endurance, difficulty walking, and decreased strength.  ACTIVITY LIMITATIONS: carrying, lifting, standing, stairs, and locomotion level  PARTICIPATION LIMITATIONS: meal prep, cleaning, laundry, shopping, community activity, and yard work  PERSONAL FACTORS: Age, Past/current experiences, Time since onset of injury/illness/exacerbation, and 1-2 comorbidities: PMH  are also affecting patient's functional outcome.   REHAB POTENTIAL: Excellent  CLINICAL DECISION MAKING: Stable/uncomplicated  EVALUATION COMPLEXITY: Low  PLAN:  PT FREQUENCY: 1-2x/week  PT DURATION: 6 weeks  PLANNED INTERVENTIONS: Therapeutic exercises, Therapeutic activity,  Neuromuscular re-education, Balance training, Gait training, Patient/Family education, Self Care, Joint mobilization, Stair training, Vestibular training, Canalith repositioning, DME instructions, Aquatic Therapy, Dry Needling, Electrical stimulation, Spinal mobilization, Moist heat, and Manual therapy  PLAN FOR NEXT SESSION: HEP review and additions, working for muscular endurance   1:27 PM, 07/08/23 M. Shary Decamp, PT, DPT Physical Therapist- [place] Office Number: [phone]

## 2023-07-08 ENCOUNTER — Ambulatory Visit (HOSPITAL_COMMUNITY)
Admit: 2023-07-08 | Discharge: 2023-07-08 | Disposition: A | Discharge status: Home / Self Care | Payer: Medicare Other | Source: Ambulatory Visit | Attending: Physician Assistant | Admitting: Physician Assistant

## 2023-07-08 ENCOUNTER — Encounter (HOSPITAL_COMMUNITY): Payer: Self-pay | Admitting: Physician Assistant

## 2023-07-08 VITALS — BP 108/70 | HR 68 | Ht 63.0 in | Wt 236.4 lb

## 2023-07-08 DIAGNOSIS — Z79899 Other long term (current) drug therapy: Secondary | ICD-10-CM | POA: Diagnosis not present

## 2023-07-08 DIAGNOSIS — Z7901 Long term (current) use of anticoagulants: Secondary | ICD-10-CM | POA: Insufficient documentation

## 2023-07-08 DIAGNOSIS — I4819 Other persistent atrial fibrillation: Secondary | ICD-10-CM | POA: Diagnosis not present

## 2023-07-08 DIAGNOSIS — I10 Essential (primary) hypertension: Secondary | ICD-10-CM | POA: Diagnosis not present

## 2023-07-08 DIAGNOSIS — Z6841 Body Mass Index (BMI) 40.0 and over, adult: Secondary | ICD-10-CM | POA: Insufficient documentation

## 2023-07-08 DIAGNOSIS — D6869 Other thrombophilia: Secondary | ICD-10-CM | POA: Diagnosis not present

## 2023-07-08 DIAGNOSIS — Z5181 Encounter for therapeutic drug level monitoring: Secondary | ICD-10-CM | POA: Insufficient documentation

## 2023-07-08 DIAGNOSIS — G4733 Obstructive sleep apnea (adult) (pediatric): Secondary | ICD-10-CM | POA: Insufficient documentation

## 2023-07-08 DIAGNOSIS — E669 Obesity, unspecified: Secondary | ICD-10-CM | POA: Insufficient documentation

## 2023-07-08 LAB — BASIC METABOLIC PANEL
Anion gap: 15 (ref 5–15)
BUN: 18 mg/dL (ref 8–23)
CO2: 22 mmol/L (ref 22–32)
Calcium: 9.2 mg/dL (ref 8.9–10.3)
Chloride: 101 mmol/L (ref 98–111)
Creatinine, Ser: 0.7 mg/dL (ref 0.44–1.00)
GFR, Estimated: >60 mL/min (ref >60)
Glucose, Bld: 88 mg/dL (ref 70–99)
Potassium: 4.2 mmol/L (ref 3.5–5.1)
Sodium: 138 mmol/L (ref 135–145)

## 2023-07-08 LAB — MAGNESIUM: Magnesium: 2.2 mg/dL (ref 1.7–2.4)

## 2023-07-08 MED ORDER — DOFETILIDE 500 MCG PO CAPS: 500.0000 ug | ORAL_CAPSULE | Freq: Two times a day (BID) | ORAL | 3 refills | Status: DC

## 2023-07-08 NOTE — Progress Notes (Signed)
Primary Care Physician: Sigmund Hazel, MD Primary Cardiologist: Dr. Allyson Sabal Primary Electrophysiologist: Dr [name]  Referring Physician: [name], NP   Kristina [name] is a 77 y.o. female with a history of aortic stenosis, aortic atherosclerosis, carotid artery disease, HTN, HLD, OSA no longer on CPAP, and persistent atrial fibrillation who presents for consultation in the [facility]. She underwent successful DCCV on 02/25/23 with ERAF several days later. Patient went to ED on 5/26 due to Afib with HR 101. Patient is on Eliquis 5 mg BID for a CHADS2VASC score of 5.  Patient is s/p dofetilide loading 9/23-9/26/24. The patient converted chemically and did not require cardioversion. The patient had significant post conversion pause of 9 seconds resulting in syncope and subsequent sinus bradycardia in the 40s. This resolved with holding her BB.   On follow up today, patient reports that she has done well since discharge. She does feel improved in SR and is working with PT to improve her muscle strength and stamina. No bleeding issues on anticoagulation.   Today, she denies symptoms of palpitations, chest pain, orthopnea, PND, lower extremity edema, dizziness, presyncope, syncope, snoring, daytime somnolence, bleeding, or neurologic sequela. The patient is tolerating medications without difficulties and is otherwise without complaint today.    Family History  Problem Relation Age of Onset   Kidney disease Mother    Hypertension Mother    Glaucoma Mother    Heart failure Father    Emphysema Father    Asthma Father    Hypertension Father    Heart disease Father    COPD Brother    Allergic rhinitis Neg Hx    Angioedema Neg Hx    Atopy Neg Hx    Eczema Neg Hx    Immunodeficiency Neg Hx    Urticaria Neg Hx     Atrial Fibrillation Management history:  Previous antiarrhythmic drugs: None Previous cardioversions: 02/25/23 Previous ablations:  None Anticoagulation history: Eliquis 5 mg BID   Past Medical History:  Diagnosis Date   Allergic rhinitis    Anemia glaucoma   Anxiety    Atrial fibrillation (HCC)    Back pain    Carotid arterial disease (HCC)    Cataract    Mixed OU   DDD (degenerative disc disease), lumbar    Family history of adverse reaction to anesthesia    daughter has PONV   Heart murmur    ECHO 10/23/22   High blood pressure    High cholesterol    History of stomach ulcers    Hypertensive retinopathy    OU   Rheumatic fever    Rheumatic fever    Rheumatoid arteritis (HCC)     Current Outpatient Medications  Medication Sig Dispense Refill   apixaban (ELIQUIS) 5 MG TABS tablet Take 1 tablet (5 mg total) by mouth 2 (two) times daily. Pt. Was given samples from her PCP enough to last one month 180 tablet 3   atorvastatin (LIPITOR) 20 MG tablet Take 20 mg by mouth every morning.     calcium elemental as carbonate (BARIATRIC TUMS ULTRA) 400 MG chewable tablet Chew 1,000 mg by mouth daily as needed for heartburn.     cyanocobalamin (VITAMIN B12) 1000 MCG tablet Take 1,000 mcg by mouth daily.     dofetilide (TIKOSYN) 500 MCG capsule Take 1 capsule (500 mcg total) by mouth 2 (two) times daily. 60 capsule 6   dorzolamide-timolol (COSOPT) 22.3-6.8 MG/ML ophthalmic solution Place 1 drop into both eyes 2 (  two) times daily. 10 mL 4   ELDERBERRY PO Take 2 tablets by mouth daily. Airborne Immune gummy     ferrous sulfate 325 (65 FE) MG EC tablet Take 325 mg by mouth daily.     furosemide (LASIX) 40 MG tablet Take 40 mg by mouth daily as needed. For weight gain or SOB     GLUCOSAMINE-CHONDROITIN PO Take 1,500 mg by mouth daily.     ibuprofen (ADVIL) 600 MG tablet Take 1 tablet (600 mg total) by mouth every 6 (six) hours as needed. 30 tablet 0   losartan (COZAAR) 100 MG tablet Take 100 mg by mouth daily.     Multiple Vitamin (MULTIVITAMIN) capsule Take 1 capsule by mouth daily. Vita fusion     Multiple  Vitamins-Minerals (AIRBORNE GUMMIES PO) Take 3 tablets by mouth daily. immune system     Vitamin D, Ergocalciferol, (DRISDOL) 1.25 MG (50000 UNIT) CAPS capsule Take 1 capsule (50,000 Units total) by mouth every 7 (seven) days. 4 capsule 0   No current facility-administered medications for this encounter.    ROS- All systems are reviewed and negative except as per the HPI above.  Physical Exam: Vitals:   07/08/23 1504  BP: 108/70  Pulse: 68  Weight: 107.2 kg  Height: 5\' 3"  (1.6 m)     GEN: Well nourished, well developed in no acute distress NECK: No JVD; No carotid bruits CARDIAC: Regular rate and rhythm, no rubs, gallops, 2/6 systolic murmur  RESPIRATORY:  Clear to auscultation without rales, wheezing or rhonchi  ABDOMEN: Soft, non-tender, non-distended EXTREMITIES:  No edema; No deformity    Wt Readings from Last 3 Encounters:  07/08/23 107.2 kg  06/28/23 107.5 kg  06/23/23 106.8 kg    EKG today demonstrates  SR, 1st degree AV block, PAC Vent. rate 68 BPM PR interval 202 ms QRS duration 82 ms QT/QTcB 434/461 ms   Echo 10/23/22 demonstrated: 1. Left ventricular ejection fraction, by estimation, is 60 to 65%. The  left ventricle has normal function. The left ventricle has no regional  wall motion abnormalities. There is mild left ventricular hypertrophy.  Left ventricular diastolic parameters  are consistent with Grade I diastolic dysfunction (impaired relaxation).  Elevated left ventricular end-diastolic pressure.   2. Right ventricular systolic function is normal. The right ventricular  size is normal.   3. Left atrial size was moderately dilated.   4. The mitral valve is abnormal. Mild mitral valve regurgitation. No  evidence of mitral stenosis. Moderate mitral annular calcification.   5. Gradients have increased since TTE done 09/08/21 . The aortic valve is  tricuspid. There is moderate calcification of the aortic valve. There is  moderate thickening of the  aortic valve. Aortic valve regurgitation is not  visualized. Moderate aortic  valve stenosis.   6. The inferior vena cava is normal in size with greater than 50%  respiratory variability, suggesting right atrial pressure of 3 mmHg.   Epic records are reviewed at length today.  CHA2DS2-VASc Score = 5  [name] score is based upon: CHF History: 0 HTN History: 1 Diabetes History: 0 Stroke History: 0 Vascular Disease History: 1 Age Score: 2 Gender Score: 1       ASSESSMENT AND PLAN: Persistent Atrial Fibrillation (ICD10:  I48.19) [name] CHA2DS2-VASc score is 5, indicating a 7.2% annual risk of stroke.   S/p dofetilide admission 9/23-9/26/24 Patient appears to be maintaining SR Continue dofetilide 500 mcg BID, QT stable Check bmet/mag today Continue Eliquis 5 mg BID  Secondary Hypercoagulable State (ICD10:  D68.69) The patient is at significant risk for stroke/thromboembolism based upon her CHA2DS2-VASc Score of 5.  Continue Apixaban (Eliquis).   Obesity Body mass index is 41.88 kg/m.  Encouraged lifestyle modification  OSA  Waiting for CPAP   Follow up with Dr [name] as scheduled.    Jorja Loa PA-C [facility] [address] [place], [place] [address] [phone] 07/08/2023 3:14 PM

## 2023-07-09 NOTE — Therapy (Signed)
OUTPATIENT PHYSICAL THERAPY NEURO TREATMENT   Patient Name: Kristina [name] MRN: [id number] DOB:03-26-1946, 77 y.o., female Today's Date: 07/12/2023   PCP: Sigmund Hazel, MD REFERRING PROVIDER: Mealor, Roberts Gaudy, MD  END OF SESSION:  PT End of Session - 07/12/23 0929     Visit Number 2    Number of Visits 8    Date for PT Re-Evaluation 08/18/23    Authorization Type [employer]    PT Start Time 0848    PT Stop Time 0929    PT Time Calculation (min) 41 min    Activity Tolerance Patient tolerated treatment well    Behavior During Therapy [facility] for tasks assessed/performed              Past Medical History:  Diagnosis Date   Allergic rhinitis    Anemia glaucoma   Anxiety    Atrial fibrillation (HCC)    Back pain    Carotid arterial disease (HCC)    Cataract    Mixed OU   DDD (degenerative disc disease), lumbar    Family history of adverse reaction to anesthesia    daughter has PONV   Heart murmur    ECHO 10/23/22   High blood pressure    High cholesterol    History of stomach ulcers    Hypertensive retinopathy    OU   Rheumatic fever    Rheumatic fever    Rheumatoid arteritis (HCC)    Past Surgical History:  Procedure Laterality Date   APPENDECTOMY  1955   CARDIOVERSION N/A 02/25/2023   Procedure: CARDIOVERSION;  Surgeon: Jodelle Red, MD;  Location: [facility] INVASIVE CV LAB;  Service: Cardiovascular;  Laterality: N/A;   CHOLECYSTECTOMY  2006   COLONOSCOPY  2023   DILATATION & CURETTAGE/HYSTEROSCOPY WITH MYOSURE N/A 04/15/2022   Procedure: DILATATION & CURETTAGE/HYSTEROSCOPY WITH MYOSURE;  Surgeon: Gerald Leitz, MD;  Location: [facility] OR;  Service: Gynecology;  Laterality: N/A;   ENDARTERECTOMY Left 09/27/2019   Procedure: ENDARTERECTOMY CAROTID LEFT;  Surgeon: Nada Libman, MD;  Location: [facility] OR;  Service: Vascular;  Laterality: Left;   HERNIA REPAIR  2007   incisional hernia after cholecystecomy   HERNIA REPAIR  2015   incisional hernia    PATCH ANGIOPLASTY Left 09/27/2019   Procedure: Patch Angioplasty Using Livia Snellen;  Surgeon: Nada Libman, MD;  Location: [facility] OR;  Service: Vascular;  Laterality: Left;   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   TONSILLECTOMY  1963   removed at 77 years old   Patient Active Problem List   Diagnosis Date Noted   Persistent atrial fibrillation (HCC) 03/09/2023   Hypercoagulable state due to persistent atrial fibrillation (HCC) 03/09/2023   Postmenopausal bleeding 04/15/2022   Endometrial polyp 04/15/2022   Prediabetes 10/27/2021   Anxiety 01/10/2021   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   Degenerative joint disease involving multiple joints 01/10/2021   Other allergy status, other than to drugs and biological substances 01/10/2021   Personal history of transient ischemic attack (TIA), and cerebral infarction without residual deficits 01/10/2021   Pure hypercholesterolemia 01/10/2021   Unspecified urinary incontinence 01/10/2021   Visual loss 01/10/2021   Vitamin D deficiency, unspecified 01/10/2021   Aortic stenosis 12/22/2019   Left carotid artery stenosis 09/27/2019   Carotid artery disease (HCC) 09/22/2019   Class 3 severe obesity with serious comorbidity and body mass index (BMI) of 40.0 to 44.9 in adult [facility]) 12/15/2018   Chronic back pain 11/21/2018   Hyperlipidemia 02/24/2017   Essential hypertension 02/24/2017  ONSET DATE: 06/28/23  REFERRING DIAG: I48.19 (ICD-10-CM) - Persistent atrial fibrillation (HCC)  THERAPY DIAG:  Muscle weakness (generalized)  Other abnormalities of gait and mobility  Unsteadiness on feet  Rationale for Evaluation and Treatment: Rehabilitation  SUBJECTIVE:                                                                                                                                                                                             SUBJECTIVE STATEMENT: Doing well. No changes.   Pt accompanied by: self  PERTINENT HISTORY:  77 y.o. female with a history of aortic stenosis, aortic atherosclerosis, carotid artery disease, HTN, HLD, OSA no longer on CPAP, and persistent atrial fibrillation, L TKR, R knee OA.Recent hospital admission for Tikosyn loading  PAIN:  Are you having pain? Yes: NPRS scale: 0/10 Pain location: low back. midline Pain description: ache, sore Aggravating factors: worse in AM Relieving factors: improves with activity  PRECAUTIONS: None  RED FLAGS: None   WEIGHT BEARING RESTRICTIONS: No  FALLS: Has patient fallen in last 6 months? Yes. Number of falls 1, tripped  LIVING ENVIRONMENT: Lives with: lives alone Lives in: House/apartment Stairs: Yes: Internal: flight, not required to go up steps; bilateral but cannot reach both and External: 1 steps; none Has following equipment at home: Walker - 4 wheeled  PLOF: Independent  PATIENT GOALS: improve endurance, return to [employer] Silver[employer]neakers  OBJECTIVE:       TODAY'S TREATMENT: 07/12/23 Activity Comments  review of HEP: sitting heel/toe raise 10x LAQ 10x each LE sitting ball squeeze 10x5" mini squat 10x Good form and recall. Required cues for form with squats  97% spO2, 76 bpm   STS 10x, 10x with green medball Focus on controlled descent   Superset:   standing march with 1.5# 2x20x   1.5# sidestepping  2x1 min Lateral trunk lean, flexed posture. R LE dragging d/t fatigue   96% spO2, 75bpm   alt toe tap under sink 2x10 Light fingertip support; B hip drop  backwards walking along counter 4x 96%, 74 bpm    PATIENT EDUCATION: Education details: edu on increasing reps for endurance and adjusting pt's baseline home exercises to progress endurance, HEP update, edu on adjustable ankle weights 1-5lbs  Person educated: Patient Education method: Explanation, Demonstration, Tactile cues, Verbal cues, and Handouts Education comprehension: verbalized understanding and returned demonstration    HOME EXERCISE PROGRAM: Access  Code: [email or web address] URL: [email or web address] Date: 07/12/2023 Prepared by: [facility] - Outpatient  Rehab - [facility]  Exercises - Seated Heel Toe Raises  - 1 x daily - 7 x weekly - 3 sets - 10 reps -  Seated Long Arc Quad  - 1 x daily - 7 x weekly - 3 sets - 10 reps - Seated Hip Adduction Squeeze with Ball  - 1 x daily - 7 x weekly - 3 sets - 10 reps - 2 sec hold - Sit to Stand with Arms Crossed  - 1 x daily - 5 x weekly - 2 sets - 10 reps - Standing March with Counter Support  - 1 x daily - 5 x weekly - 2 sets - 20 reps - Side Stepping with Counter Support  - 1 x daily - 5 x weekly - 2 sets - 10 reps - Mini Squat with Counter Support  - 1 x daily - 7 x weekly - 5 sets - 5 reps    ............................  Note: Objective measures were completed at Evaluation unless otherwise noted.  VITALS: 95%, 74 bpm, 114/70 mmHg  DIAGNOSTIC FINDINGS: EKG during hospitalization  COGNITION: Overall cognitive status: Within functional limits for tasks assessed   SENSATION: WFL  COORDINATION: NL  EDEMA:  none  MUSCLE TONE: WNL    DTRs:  NT  POSTURE: No Significant postural limitations  LOWER EXTREMITY ROM:     WNL  LOWER EXTREMITY MMT:    Grossly 4/5  BED MOBILITY:  Indep  TRANSFERS: Assistive device utilized: None  Sit to stand: Complete Independence Stand to sit: Complete Independence Chair to chair: Complete Independence Floor:  NT   CURB:  Level of Assistance: Complete Independence Assistive device utilized: None Curb Comments: requires to stop and position  STAIRS: Level of Assistance: Modified independence Stair Negotiation Technique: Step to Pattern with Bilateral Rails   GAIT: Gait pattern:  lateral trunk displacement in stance due to LBP Distance walked: 300 ft Assistive device utilized: None Level of assistance: Complete Independence Comments:   FUNCTIONAL TESTS:  5 times sit to stand: 25 sec 2 minute walk test: 300  ft Vitals pre-test: see above Vitals post-test: 98%, 79 bpm, 161/85 mmHg Rates 13/20 Borg RPE  M-CTSIB  Condition 1: Firm Surface, EO 30 Sec, Mild Sway  Condition 2: Firm Surface, EC 30 Sec, Mild and Moderate Sway  Condition 3: Foam Surface, EO 30 Sec, Mild Sway  Condition 4: Foam Surface, EC 20 Sec, Mild and Moderate Sway       TODAY'S TREATMENT:                                                                                                                              DATE: 07/07/23    PATIENT EDUCATION: Education details: assessment details, rationale of PT intervention, HEP initiated Person educated: Patient Education method: Explanation and Handouts Education comprehension: verbalized understanding  HOME EXERCISE PROGRAM: Access Code: [email or web address] URL: [email or web address] Date: 07/07/2023 Prepared by: Shary Decamp  Exercises - Seated Heel Toe Raises  - 1 x daily - 7 x weekly - 3 sets - 10 reps - Seated Long Arc Quad  - 1 x daily - 7 x  weekly - 3 sets - 10 reps - Seated Hip Adduction Squeeze with Ball  - 1 x daily - 7 x weekly - 3 sets - 10 reps - 2 sec hold - Mini Squat with Counter Support  - 1 x daily - 7 x weekly - 5 sets - 5 reps  GOALS: Goals reviewed with patient? Yes  SHORT TERM GOALS: Target date: 07/28/2023    Patient will be independent in HEP to improve functional outcomes Baseline: Goal status: INITIAL  2.  Demo improved LE strength and reduced risk for falls per 15 sec 5xSTS Test Baseline: 25 sec Goal status: IN PROGRESS    LONG TERM GOALS: Target date: 08/18/2023    Reports ability to resume Silver Sneakers program at local facility Baseline:  Goal status: IN PROGRESS  2.  Demo improved cardiovascular endurance and gait speed per distance of 450 ft during 2 MWT Baseline: 300 ft, 13/20 Borg RPE Goal status: IN PROGRESS  3.  Demo improved postural stability per mild sway x 30 sec condition 4 M-CTSIB Baseline: 20 sec  mild-mod Goal status:IN PROGRESS    ASSESSMENT:  CLINICAL IMPRESSION: Patient arrived to session without complaints. Reviewed HEP which was performed with good form and recall. Progressed LE strengthening and endurance exercises further, providing cueing and demo for form. Signs of fatigue including flexed posture and dragging of R LE evident with exercises. Vitals were monitored throughout session, which stayed fairly consistent. Patient tolerated session well and reported understanding of HEP update. No complaints upon leaving.   OBJECTIVE IMPAIRMENTS: Abnormal gait, cardiopulmonary status limiting activity, decreased activity tolerance, decreased balance, decreased endurance, difficulty walking, and decreased strength.   ACTIVITY LIMITATIONS: carrying, lifting, standing, stairs, and locomotion level  PARTICIPATION LIMITATIONS: meal prep, cleaning, laundry, shopping, community activity, and yard work  PERSONAL FACTORS: Age, Past/current experiences, Time since onset of injury/illness/exacerbation, and 1-2 comorbidities: PMH  are also affecting patient's functional outcome.   REHAB POTENTIAL: Excellent  CLINICAL DECISION MAKING: Stable/uncomplicated  EVALUATION COMPLEXITY: Low  PLAN:  PT FREQUENCY: 1-2x/week  PT DURATION: 6 weeks  PLANNED INTERVENTIONS: Therapeutic exercises, Therapeutic activity, Neuromuscular re-education, Balance training, Gait training, Patient/Family education, Self Care, Joint mobilization, Stair training, Vestibular training, Canalith repositioning, DME instructions, Aquatic Therapy, Dry Needling, Electrical stimulation, Spinal mobilization, Moist heat, and Manual therapy  PLAN FOR NEXT SESSION: working for muscular endurance    Anette Guarneri, PT, DPT 07/12/23 9:30 AM   Outpatient Rehab at [facility] [address] [place], [place] [address] Phone # [phone] Fax # [phone]

## 2023-07-12 ENCOUNTER — Encounter: Payer: Self-pay | Admitting: Physical Therapy

## 2023-07-12 ENCOUNTER — Ambulatory Visit: Payer: Medicare Other | Admitting: Physical Therapy

## 2023-07-12 DIAGNOSIS — M6281 Muscle weakness (generalized): Secondary | ICD-10-CM

## 2023-07-12 DIAGNOSIS — R2681 Unsteadiness on feet: Secondary | ICD-10-CM

## 2023-07-12 DIAGNOSIS — R2689 Other abnormalities of gait and mobility: Secondary | ICD-10-CM

## 2023-07-12 DIAGNOSIS — I4819 Other persistent atrial fibrillation: Secondary | ICD-10-CM | POA: Diagnosis not present

## 2023-07-15 NOTE — Therapy (Signed)
OUTPATIENT PHYSICAL THERAPY NEURO TREATMENT   Patient Name: Kristina [name] MRN: [id number] DOB:1946/04/15, 77 y.o., female Today's Date: 07/16/2023   PCP: Sigmund Hazel, MD REFERRING PROVIDER: Mealor, Roberts Gaudy, MD  END OF SESSION:  PT End of Session - 07/16/23 0937     Visit Number 3    Number of Visits 8    Date for PT Re-Evaluation 08/18/23    Authorization Type [employer] Medicare    PT Start Time [phone]    PT Stop Time 0939    PT Time Calculation (min) 43 min    Activity Tolerance Patient tolerated treatment well    Behavior During Therapy [facility] for tasks assessed/performed               Past Medical History:  Diagnosis Date   Allergic rhinitis    Anemia glaucoma   Anxiety    Atrial fibrillation (HCC)    Back pain    Carotid arterial disease (HCC)    Cataract    Mixed OU   DDD (degenerative disc disease), lumbar    Family history of adverse reaction to anesthesia    daughter has PONV   Heart murmur    ECHO 10/23/22   High blood pressure    High cholesterol    History of stomach ulcers    Hypertensive retinopathy    OU   Rheumatic fever    Rheumatic fever    Rheumatoid arteritis (HCC)    Past Surgical History:  Procedure Laterality Date   APPENDECTOMY  1955   CARDIOVERSION N/A 02/25/2023   Procedure: CARDIOVERSION;  Surgeon: Jodelle Red, MD;  Location: [facility] INVASIVE CV LAB;  Service: Cardiovascular;  Laterality: N/A;   CHOLECYSTECTOMY  2006   COLONOSCOPY  2023   DILATATION & CURETTAGE/HYSTEROSCOPY WITH MYOSURE N/A 04/15/2022   Procedure: DILATATION & CURETTAGE/HYSTEROSCOPY WITH MYOSURE;  Surgeon: Gerald Leitz, MD;  Location: [facility] OR;  Service: Gynecology;  Laterality: N/A;   ENDARTERECTOMY Left 09/27/2019   Procedure: ENDARTERECTOMY CAROTID LEFT;  Surgeon: Nada Libman, MD;  Location: [facility] OR;  Service: Vascular;  Laterality: Left;   HERNIA REPAIR  2007   incisional hernia after cholecystecomy   HERNIA REPAIR  2015   incisional  hernia   PATCH ANGIOPLASTY Left 09/27/2019   Procedure: Patch Angioplasty Using Livia Snellen;  Surgeon: Nada Libman, MD;  Location: [facility] OR;  Service: Vascular;  Laterality: Left;   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   TONSILLECTOMY  1963   removed at 77 years old   Patient Active Problem List   Diagnosis Date Noted   Persistent atrial fibrillation (HCC) 03/09/2023   Hypercoagulable state due to persistent atrial fibrillation (HCC) 03/09/2023   Postmenopausal bleeding 04/15/2022   Endometrial polyp 04/15/2022   Prediabetes 10/27/2021   Anxiety 01/10/2021   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   Degenerative joint disease involving multiple joints 01/10/2021   Other allergy status, other than to drugs and biological substances 01/10/2021   Personal history of transient ischemic attack (TIA), and cerebral infarction without residual deficits 01/10/2021   Pure hypercholesterolemia 01/10/2021   Unspecified urinary incontinence 01/10/2021   Visual loss 01/10/2021   Vitamin D deficiency, unspecified 01/10/2021   Aortic stenosis 12/22/2019   Left carotid artery stenosis 09/27/2019   Carotid artery disease (HCC) 09/22/2019   Class 3 severe obesity with serious comorbidity and body mass index (BMI) of 40.0 to 44.9 in adult [facility]) 12/15/2018   Chronic back pain 11/21/2018   Hyperlipidemia 02/24/2017   Essential hypertension 02/24/2017  ONSET DATE: 06/28/23  REFERRING DIAG: I48.19 (ICD-10-CM) - Persistent atrial fibrillation (HCC)  THERAPY DIAG:  Muscle weakness (generalized)  Other abnormalities of gait and mobility  Unsteadiness on feet  Rationale for Evaluation and Treatment: Rehabilitation  SUBJECTIVE:                                                                                                                                                                                             SUBJECTIVE STATEMENT: Been working with the ankle weights. Going well.   Pt accompanied  by: self  PERTINENT HISTORY: 77 y.o. female with a history of aortic stenosis, aortic atherosclerosis, carotid artery disease, HTN, HLD, OSA no longer on CPAP, and persistent atrial fibrillation, L TKR, R knee OA.Recent hospital admission for Tikosyn loading  PAIN:  Are you having pain? Yes: NPRS scale: 0/10 Pain location: low back. midline Pain description: ache, sore Aggravating factors: worse in AM Relieving factors: improves with activity  PRECAUTIONS: None  RED FLAGS: None   WEIGHT BEARING RESTRICTIONS: No  FALLS: Has patient fallen in last 6 months? Yes. Number of falls 1, tripped  LIVING ENVIRONMENT: Lives with: lives alone Lives in: House/apartment Stairs: Yes: Internal: flight, not required to go up steps; bilateral but cannot reach both and External: 1 steps; none Has following equipment at home: Walker - 4 wheeled  PLOF: Independent  PATIENT GOALS: improve endurance, return to [employer] SilverSneakers  OBJECTIVE:     TODAY'S TREATMENT: 07/16/23 Activity Comments  Vitals at start of session 95%, 80bpm  Nustep L5 x 6 min UEs/LEs  Cues to maintain ~ 55 SPM 97%, 88bpm  Superset:  standing hip ABD 2# 2x10 standing hip EX 2# 2x10  Cues for upright standing posture   98% spO2 91 bpm   Superset:  step ups on 6" step 2x30" with 2# romberg + head turns/nods 2x30" each  Required B UE support with step ups; mild sway and slow speed in romberg   98% spO2 80 bpm   Superset:  Sidestepping 2# 2x 1 min backwards walking  2# 2x 1 min  Cues to stand tall, pick up R foot, wider BOS with backwards walking   Superset:  standing heel raise 2x10 mini squat 2x10   Good form 98% spO2 91 bpm          PATIENT EDUCATION: Education details: discussion on cardio to increase endurance for patient's goal of walking her dog at the park, HEP update  Person educated: Patient Education method: Explanation, Demonstration, Tactile cues, Verbal cues, and Handouts Education  comprehension: verbalized understanding and returned demonstration    HOME EXERCISE PROGRAM: Access  Code: [email or web address] URL: [email or web address] Date: 07/16/2023 Prepared by: [facility] - Outpatient  Rehab - [facility]  Exercises - Seated Heel Toe Raises  - 1 x daily - 7 x weekly - 3 sets - 10 reps - Seated Long Arc Quad  - 1 x daily - 7 x weekly - 3 sets - 10 reps - Seated Hip Adduction Squeeze with Ball  - 1 x daily - 7 x weekly - 3 sets - 10 reps - 2 sec hold - Sit to Stand with Arms Crossed  - 1 x daily - 5 x weekly - 2 sets - 10 reps - Standing March with Counter Support  - 1 x daily - 5 x weekly - 2 sets - 20 reps - Side Stepping with Counter Support  - 1 x daily - 5 x weekly - 2 sets - 10 reps - Mini Squat with Counter Support  - 1 x daily - 7 x weekly - 5 sets - 5 reps - Standing Hip Abduction with Ankle Weight  - 1 x daily - 5 x weekly - 2 sets - 10 reps    ............................  Note: Objective measures were completed at Evaluation unless otherwise noted.  VITALS: 95%, 74 bpm, 114/70 mmHg  DIAGNOSTIC FINDINGS: EKG during hospitalization  COGNITION: Overall cognitive status: Within functional limits for tasks assessed   SENSATION: WFL  COORDINATION: NL  EDEMA:  none  MUSCLE TONE: WNL    DTRs:  NT  POSTURE: No Significant postural limitations  LOWER EXTREMITY ROM:     WNL  LOWER EXTREMITY MMT:    Grossly 4/5  BED MOBILITY:  Indep  TRANSFERS: Assistive device utilized: None  Sit to stand: Complete Independence Stand to sit: Complete Independence Chair to chair: Complete Independence Floor:  NT   CURB:  Level of Assistance: Complete Independence Assistive device utilized: None Curb Comments: requires to stop and position  STAIRS: Level of Assistance: Modified independence Stair Negotiation Technique: Step to Pattern with Bilateral Rails   GAIT: Gait pattern:  lateral trunk displacement in stance due to  LBP Distance walked: 300 ft Assistive device utilized: None Level of assistance: Complete Independence Comments:   FUNCTIONAL TESTS:  5 times sit to stand: 25 sec 2 minute walk test: 300 ft Vitals pre-test: see above Vitals post-test: 98%, 79 bpm, 161/85 mmHg Rates 13/20 Borg RPE  M-CTSIB  Condition 1: Firm Surface, EO 30 Sec, Mild Sway  Condition 2: Firm Surface, EC 30 Sec, Mild and Moderate Sway  Condition 3: Foam Surface, EO 30 Sec, Mild Sway  Condition 4: Foam Surface, EC 20 Sec, Mild and Moderate Sway       TODAY'S TREATMENT:                                                                                                                              DATE: 07/07/23    PATIENT EDUCATION: Education details: assessment details, rationale of PT intervention, HEP initiated  Person educated: Patient Education method: Explanation and Handouts Education comprehension: verbalized understanding  HOME EXERCISE PROGRAM: Access Code: [email or web address] URL: [email or web address] Date: 07/07/2023 Prepared by: Shary Decamp  Exercises - Seated Heel Toe Raises  - 1 x daily - 7 x weekly - 3 sets - 10 reps - Seated Long Arc Quad  - 1 x daily - 7 x weekly - 3 sets - 10 reps - Seated Hip Adduction Squeeze with Ball  - 1 x daily - 7 x weekly - 3 sets - 10 reps - 2 sec hold - Mini Squat with Counter Support  - 1 x daily - 7 x weekly - 5 sets - 5 reps  GOALS: Goals reviewed with patient? Yes  SHORT TERM GOALS: Target date: 07/28/2023    Patient will be independent in HEP to improve functional outcomes Baseline: Goal status: MET  2.  Demo improved LE strength and reduced risk for falls per 15 sec 5xSTS Test Baseline: 25 sec Goal status: IN PROGRESS    LONG TERM GOALS: Target date: 08/18/2023    Reports ability to resume Silver Sneakers program at local facility Baseline:  Goal status: IN PROGRESS  2.  Demo improved cardiovascular endurance and gait speed per distance of  450 ft during 2 MWT Baseline: 300 ft, 13/20 Borg RPE Goal status: IN PROGRESS  3.  Demo improved postural stability per mild sway x 30 sec condition 4 M-CTSIB Baseline: 20 sec mild-mod Goal status:IN PROGRESS    ASSESSMENT:  CLINICAL IMPRESSION: Patient arrived to session without complaints. Reports compliance with HEP. Vitals WNL at start of session; continued to monitor HR and O2 saturation throughout to ensure normal response to exercise. Continued working on standing LE strengthening and balance tasks to challenge endurance and strength. Patient required very minimal rest breaks today. Lateral hip instability is evident with standing and walking, thus updated HEP with hip abduction strengthening which was well-tolerated today. Patient reported some muscle fatigue upon leaving.   OBJECTIVE IMPAIRMENTS: Abnormal gait, cardiopulmonary status limiting activity, decreased activity tolerance, decreased balance, decreased endurance, difficulty walking, and decreased strength.   ACTIVITY LIMITATIONS: carrying, lifting, standing, stairs, and locomotion level  PARTICIPATION LIMITATIONS: meal prep, cleaning, laundry, shopping, community activity, and yard work  PERSONAL FACTORS: Age, Past/current experiences, Time since onset of injury/illness/exacerbation, and 1-2 comorbidities: PMH  are also affecting patient's functional outcome.   REHAB POTENTIAL: Excellent  CLINICAL DECISION MAKING: Stable/uncomplicated  EVALUATION COMPLEXITY: Low  PLAN:  PT FREQUENCY: 1-2x/week  PT DURATION: 6 weeks  PLANNED INTERVENTIONS: Therapeutic exercises, Therapeutic activity, Neuromuscular re-education, Balance training, Gait training, Patient/Family education, Self Care, Joint mobilization, Stair training, Vestibular training, Canalith repositioning, DME instructions, Aquatic Therapy, Dry Needling, Electrical stimulation, Spinal mobilization, Moist heat, and Manual therapy  PLAN FOR NEXT SESSION: working  for muscular endurance    Anette Guarneri, PT, DPT 07/16/23 9:44 AM  [facility] [address] [place], [place] [address] Phone # [phone] Fax # [phone]

## 2023-07-16 ENCOUNTER — Encounter: Payer: Self-pay | Admitting: Physical Therapy

## 2023-07-16 ENCOUNTER — Ambulatory Visit: Payer: Medicare Other | Admitting: Physical Therapy

## 2023-07-16 DIAGNOSIS — R2681 Unsteadiness on feet: Secondary | ICD-10-CM | POA: Diagnosis not present

## 2023-07-16 DIAGNOSIS — R2689 Other abnormalities of gait and mobility: Secondary | ICD-10-CM | POA: Diagnosis not present

## 2023-07-16 DIAGNOSIS — M6281 Muscle weakness (generalized): Secondary | ICD-10-CM | POA: Diagnosis not present

## 2023-07-16 DIAGNOSIS — I4819 Other persistent atrial fibrillation: Secondary | ICD-10-CM | POA: Diagnosis not present

## 2023-07-20 ENCOUNTER — Telehealth: Payer: Self-pay | Admitting: Cardiovascular Disease

## 2023-07-20 ENCOUNTER — Ambulatory Visit: Payer: Medicare Other

## 2023-07-20 ENCOUNTER — Other Ambulatory Visit (HOSPITAL_COMMUNITY): Payer: Self-pay

## 2023-07-20 DIAGNOSIS — R2681 Unsteadiness on feet: Secondary | ICD-10-CM | POA: Diagnosis not present

## 2023-07-20 DIAGNOSIS — R2689 Other abnormalities of gait and mobility: Secondary | ICD-10-CM

## 2023-07-20 DIAGNOSIS — M6281 Muscle weakness (generalized): Secondary | ICD-10-CM

## 2023-07-20 DIAGNOSIS — I4819 Other persistent atrial fibrillation: Secondary | ICD-10-CM | POA: Diagnosis not present

## 2023-07-20 NOTE — Therapy (Signed)
OUTPATIENT PHYSICAL THERAPY NEURO TREATMENT   Patient Name: Kristina [name] MRN: [id number] DOB:[birth year], 77 y.o., female Today's Date: 07/23/2023   PCP: Sigmund Hazel, MD REFERRING PROVIDER: Mealor, Roberts Gaudy, MD  END OF SESSION:  PT End of Session - 07/23/23 1008     Visit Number 5    Number of Visits 8    Date for PT Re-Evaluation 08/18/23    Authorization Type [employer]    PT Start Time [phone]    PT Stop Time 1009    PT Time Calculation (min) 40 min    Equipment Utilized During Treatment Gait belt    Activity Tolerance Patient tolerated treatment well    Behavior During Therapy WFL for tasks assessed/performed                Past Medical History:  Diagnosis Date   Allergic rhinitis    Anemia glaucoma   Anxiety    Atrial fibrillation (HCC)    Back pain    Carotid arterial disease (HCC)    Cataract    Mixed OU   DDD (degenerative disc disease), lumbar    Family history of adverse reaction to anesthesia    daughter has PONV   Heart murmur    ECHO 10/23/22   High blood pressure    High cholesterol    History of stomach ulcers    Hypertensive retinopathy    OU   Rheumatic fever    Rheumatic fever    Rheumatoid arteritis (HCC)    Past Surgical History:  Procedure Laterality Date   APPENDECTOMY  1955   CARDIOVERSION N/A 02/25/2023   Procedure: CARDIOVERSION;  Surgeon: Jodelle Red, MD;  Location: [facility] INVASIVE CV LAB;  Service: Cardiovascular;  Laterality: N/A;   CHOLECYSTECTOMY  2006   COLONOSCOPY  2023   DILATATION & CURETTAGE/HYSTEROSCOPY WITH MYOSURE N/A 04/15/2022   Procedure: DILATATION & CURETTAGE/HYSTEROSCOPY WITH MYOSURE;  Surgeon: Gerald Leitz, MD;  Location: [facility] OR;  Service: Gynecology;  Laterality: N/A;   ENDARTERECTOMY Left 09/27/2019   Procedure: ENDARTERECTOMY CAROTID LEFT;  Surgeon: Nada Libman, MD;  Location: [facility] OR;  Service: Vascular;  Laterality: Left;   HERNIA REPAIR  2007   incisional hernia after  cholecystecomy   HERNIA REPAIR  2015   incisional hernia   PATCH ANGIOPLASTY Left 09/27/2019   Procedure: Patch Angioplasty Using Livia Snellen;  Surgeon: Nada Libman, MD;  Location: [facility] OR;  Service: Vascular;  Laterality: Left;   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   TONSILLECTOMY  1963   removed at 77 years old   Patient Active Problem List   Diagnosis Date Noted   Persistent atrial fibrillation (HCC) 03/09/2023   Hypercoagulable state due to persistent atrial fibrillation (HCC) 03/09/2023   Postmenopausal bleeding 04/15/2022   Endometrial polyp 04/15/2022   Prediabetes 10/27/2021   Anxiety 01/10/2021   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   Degenerative joint disease involving multiple joints 01/10/2021   Other allergy status, other than to drugs and biological substances 01/10/2021   Personal history of transient ischemic attack (TIA), and cerebral infarction without residual deficits 01/10/2021   Pure hypercholesterolemia 01/10/2021   Unspecified urinary incontinence 01/10/2021   Visual loss 01/10/2021   Vitamin D deficiency, unspecified 01/10/2021   Aortic stenosis 12/22/2019   Left carotid artery stenosis 09/27/2019   Carotid artery disease (HCC) 09/22/2019   Class 3 severe obesity with serious comorbidity and body mass index (BMI) of 40.0 to 44.9 in adult (HCC) 12/15/2018   Chronic back pain 11/21/2018  Hyperlipidemia 02/24/2017   Essential hypertension 02/24/2017    ONSET DATE: 06/28/23  REFERRING DIAG: I48.19 (ICD-10-CM) - Persistent atrial fibrillation (HCC)  THERAPY DIAG:  Muscle weakness (generalized)  Other abnormalities of gait and mobility  Unsteadiness on feet  Rationale for Evaluation and Treatment: Rehabilitation  SUBJECTIVE:                                                                                                                                                                                             SUBJECTIVE STATEMENT: I feel that I'm  getting stronger. Still feel that I don't have a long walking endurance yet.  Pt accompanied by: self  PERTINENT HISTORY: 77 y.o. female with a history of aortic stenosis, aortic atherosclerosis, carotid artery disease, HTN, HLD, OSA no longer on CPAP, and persistent atrial fibrillation, L TKR, R knee OA.Recent hospital admission for Tikosyn loading  PAIN:  Are you having pain? Yes: NPRS scale: 0/10 Pain location: low back. midline Pain description: ache, sore Aggravating factors: worse in AM Relieving factors: improves with activity  PRECAUTIONS: None  RED FLAGS: None   WEIGHT BEARING RESTRICTIONS: No  FALLS: Has patient fallen in last 6 months? Yes. Number of falls 1, tripped  LIVING ENVIRONMENT: Lives with: lives alone Lives in: House/apartment Stairs: Yes: Internal: flight, not required to go up steps; bilateral but cannot reach both and External: 1 steps; none Has following equipment at home: Walker - 4 wheeled  PLOF: Independent  PATIENT GOALS: improve endurance, return to [employer] Silver[employer]neakers  OBJECTIVE:    TODAY'S TREATMENT: 07/23/23 Activity Comments  Nustep L6 x 6 min UEs/LEs  Maintaining ~ ~90 SPM 98% spO2, 95 bpm  Superset: 2 sets of each alt toe tap on 6" step 5# 30" 1 foot on step + alt UE raise 30" each LE  CGA for toe taps d/t some hip instability  98% spO2, 97 bpm  Superset:  2 sets of each alt step ups 5# 10x total HS curls 5# 10x each  R LE appears to fatigue and drag; B UE support    Water break    Superset:  2 sets of each STS with green medball 10x  sidestepping with "jumping jack" arms 1 min  Instability and significant hip drop with sidesteps- requires supervision   Water/rest break  98% spO2, 96 bpm  Superset:  2 sets of each pulley rows 20# 10x  mini squat 10x  CGA during rows d/t imbalance in staggered stance 97% spO2, 96 bpm      HOME EXERCISE PROGRAM: Access Code: [email or web address] URL: [email or web address] Date:  07/16/2023 Prepared by: [facility] - Outpatient  Rehab - [facility]  Exercises - Seated Heel Toe Raises  - 1 x daily - 7 x weekly - 3 sets - 10 reps - Seated Long Arc Quad  - 1 x daily - 7 x weekly - 3 sets - 10 reps - Seated Hip Adduction Squeeze with Ball  - 1 x daily - 7 x weekly - 3 sets - 10 reps - 2 sec hold - Sit to Stand with Arms Crossed  - 1 x daily - 5 x weekly - 2 sets - 10 reps - Standing March with Counter Support  - 1 x daily - 5 x weekly - 2 sets - 20 reps - Side Stepping with Counter Support  - 1 x daily - 5 x weekly - 2 sets - 10 reps - Mini Squat with Counter Support  - 1 x daily - 7 x weekly - 5 sets - 5 reps - Standing Hip Abduction with Ankle Weight  - 1 x daily - 5 x weekly - 2 sets - 10 reps     ............................  Note: Objective measures were completed at Evaluation unless otherwise noted.  VITALS: 95%, 74 bpm, 114/70 mmHg  DIAGNOSTIC FINDINGS: EKG during hospitalization  COGNITION: Overall cognitive status: Within functional limits for tasks assessed   SENSATION: WFL  COORDINATION: NL  EDEMA:  none  MUSCLE TONE: WNL    DTRs:  NT  POSTURE: No Significant postural limitations  LOWER EXTREMITY ROM:     WNL  LOWER EXTREMITY MMT:    Grossly 4/5  BED MOBILITY:  Indep  TRANSFERS: Assistive device utilized: None  Sit to stand: Complete Independence Stand to sit: Complete Independence Chair to chair: Complete Independence Floor:  NT   CURB:  Level of Assistance: Complete Independence Assistive device utilized: None Curb Comments: requires to stop and position  STAIRS: Level of Assistance: Modified independence Stair Negotiation Technique: Step to Pattern with Bilateral Rails   GAIT: Gait pattern:  lateral trunk displacement in stance due to LBP Distance walked: 300 ft Assistive device utilized: None Level of assistance: Complete Independence Comments:   FUNCTIONAL TESTS:  5 times sit to stand: 25 sec 2  minute walk test: 300 ft Vitals pre-test: see above Vitals post-test: 98%, 79 bpm, 161/85 mmHg Rates 13/20 Borg RPE  M-CTSIB  Condition 1: Firm Surface, EO 30 Sec, Mild Sway  Condition 2: Firm Surface, EC 30 Sec, Mild and Moderate Sway  Condition 3: Foam Surface, EO 30 Sec, Mild Sway  Condition 4: Foam Surface, EC 20 Sec, Mild and Moderate Sway         GOALS: Goals reviewed with patient? Yes  SHORT TERM GOALS: Target date: 07/28/2023    Patient will be independent in HEP to improve functional outcomes Baseline: Goal status: MET  2.  Demo improved LE strength and reduced risk for falls per 15 sec 5xSTS Test Baseline: 25 sec Goal status: IN PROGRESS    LONG TERM GOALS: Target date: 08/18/2023    Reports ability to resume Silver Sneakers program at local facility Baseline:  Goal status: IN PROGRESS  2.  Demo improved cardiovascular endurance and gait speed per distance of 450 ft during 2 MWT Baseline: 300 ft, 13/20 Borg RPE Goal status: IN PROGRESS  3.  Demo improved postural stability per mild sway x 30 sec condition 4 M-CTSIB Baseline: 20 sec mild-mod Goal status:IN PROGRESS    ASSESSMENT:  CLINICAL IMPRESSION: Patient arrived to session with report of progress towards goals and improving strength;  still feels limited in walking endurance. Continued progressing stamina and increased activity tolerance today. Patient required CGA for toe taps d/t hip instability, UE support with step ups, and instability with sidestepping without UE support. Patient continues to stay motivated and demonstrate good effort during sessions. Occasional rest breaks taken between exercises and to monitor vitals which continue to be WNL for exercise. No complaints upon leaving.   OBJECTIVE IMPAIRMENTS: Abnormal gait, cardiopulmonary status limiting activity, decreased activity tolerance, decreased balance, decreased endurance, difficulty walking, and decreased strength.   ACTIVITY  LIMITATIONS: carrying, lifting, standing, stairs, and locomotion level  PARTICIPATION LIMITATIONS: meal prep, cleaning, laundry, shopping, community activity, and yard work  PERSONAL FACTORS: Age, Past/current experiences, Time since onset of injury/illness/exacerbation, and 1-2 comorbidities: PMH  are also affecting patient's functional outcome.   REHAB POTENTIAL: Excellent  CLINICAL DECISION MAKING: Stable/uncomplicated  EVALUATION COMPLEXITY: Low  PLAN:  PT FREQUENCY: 1-2x/week  PT DURATION: 6 weeks  PLANNED INTERVENTIONS: Therapeutic exercises, Therapeutic activity, Neuromuscular re-education, Balance training, Gait training, Patient/Family education, Self Care, Joint mobilization, Stair training, Vestibular training, Canalith repositioning, DME instructions, Aquatic Therapy, Dry Needling, Electrical stimulation, Spinal mobilization, Moist heat, and Manual therapy  PLAN FOR NEXT SESSION: working for muscular endurance    Anette Guarneri, PT, DPT 07/23/23 10:10 AM  [facility] at [facility] [address] [place], [place] [address] Phone # [phone] Fax # [phone]

## 2023-07-20 NOTE — Telephone Encounter (Signed)
Patient called to ask some question regarding the medications she received in the hospital. Please call back

## 2023-07-20 NOTE — Telephone Encounter (Signed)
Spoke with patient. Pt recently discharged from hospital stay. She wants to know who will fill her Tikosyn when it's time for refills. Advised pt she could call the office of the A-fib clinic for refills. She currently have 30 days and 3 refill at [facility] pharmacy and she is aware.

## 2023-07-20 NOTE — Therapy (Signed)
OUTPATIENT PHYSICAL THERAPY NEURO TREATMENT   Patient Name: Kristina [name] MRN: [id number] DOB:June 24, 1946, 77 y.o., female Today's Date: 07/20/2023   PCP: Sigmund Hazel, MD REFERRING PROVIDER: Mealor, Roberts Gaudy, MD  END OF SESSION:  PT End of Session - 07/20/23 0929     Visit Number 4    Number of Visits 8    Date for PT Re-Evaluation 08/18/23    Authorization Type [employer] Medicare    PT Start Time 0930    PT Stop Time 1015    PT Time Calculation (min) 45 min    Activity Tolerance Patient tolerated treatment well    Behavior During Therapy WFL for tasks assessed/performed               Past Medical History:  Diagnosis Date   Allergic rhinitis    Anemia glaucoma   Anxiety    Atrial fibrillation (HCC)    Back pain    Carotid arterial disease (HCC)    Cataract    Mixed OU   DDD (degenerative disc disease), lumbar    Family history of adverse reaction to anesthesia    daughter has PONV   Heart murmur    ECHO 10/23/22   High blood pressure    High cholesterol    History of stomach ulcers    Hypertensive retinopathy    OU   Rheumatic fever    Rheumatic fever    Rheumatoid arteritis (HCC)    Past Surgical History:  Procedure Laterality Date   APPENDECTOMY  1955   CARDIOVERSION N/A 02/25/2023   Procedure: CARDIOVERSION;  Surgeon: Jodelle Red, MD;  Location: [facility] INVASIVE CV LAB;  Service: Cardiovascular;  Laterality: N/A;   CHOLECYSTECTOMY  2006   COLONOSCOPY  2023   DILATATION & CURETTAGE/HYSTEROSCOPY WITH MYOSURE N/A 04/15/2022   Procedure: DILATATION & CURETTAGE/HYSTEROSCOPY WITH MYOSURE;  Surgeon: Gerald Leitz, MD;  Location: [facility] OR;  Service: Gynecology;  Laterality: N/A;   ENDARTERECTOMY Left 09/27/2019   Procedure: ENDARTERECTOMY CAROTID LEFT;  Surgeon: Nada Libman, MD;  Location: [facility] OR;  Service: Vascular;  Laterality: Left;   HERNIA REPAIR  2007   incisional hernia after cholecystecomy   HERNIA REPAIR  2015   incisional  hernia   PATCH ANGIOPLASTY Left 09/27/2019   Procedure: Patch Angioplasty Using Livia Snellen;  Surgeon: Nada Libman, MD;  Location: [facility] OR;  Service: Vascular;  Laterality: Left;   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   TONSILLECTOMY  1963   removed at 77 years old   Patient Active Problem List   Diagnosis Date Noted   Persistent atrial fibrillation (HCC) 03/09/2023   Hypercoagulable state due to persistent atrial fibrillation (HCC) 03/09/2023   Postmenopausal bleeding 04/15/2022   Endometrial polyp 04/15/2022   Prediabetes 10/27/2021   Anxiety 01/10/2021   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   Degenerative joint disease involving multiple joints 01/10/2021   Other allergy status, other than to drugs and biological substances 01/10/2021   Personal history of transient ischemic attack (TIA), and cerebral infarction without residual deficits 01/10/2021   Pure hypercholesterolemia 01/10/2021   Unspecified urinary incontinence 01/10/2021   Visual loss 01/10/2021   Vitamin D deficiency, unspecified 01/10/2021   Aortic stenosis 12/22/2019   Left carotid artery stenosis 09/27/2019   Carotid artery disease (HCC) 09/22/2019   Class 3 severe obesity with serious comorbidity and body mass index (BMI) of 40.0 to 44.9 in adult [facility]) 12/15/2018   Chronic back pain 11/21/2018   Hyperlipidemia 02/24/2017   Essential hypertension 02/24/2017  ONSET DATE: 06/28/23  REFERRING DIAG: I48.19 (ICD-10-CM) - Persistent atrial fibrillation (HCC)  THERAPY DIAG:  Muscle weakness (generalized)  Other abnormalities of gait and mobility  Unsteadiness on feet  Rationale for Evaluation and Treatment: Rehabilitation  SUBJECTIVE:                                                                                                                                                                                             SUBJECTIVE STATEMENT: Went on short walk with dog yesterday  Pt accompanied by:  self  PERTINENT HISTORY: 77 y.o. female with a history of aortic stenosis, aortic atherosclerosis, carotid artery disease, HTN, HLD, OSA no longer on CPAP, and persistent atrial fibrillation, L TKR, R knee OA.Recent hospital admission for Tikosyn loading  PAIN:  Are you having pain? Yes: NPRS scale: 0/10 Pain location: low back. midline Pain description: ache, sore Aggravating factors: worse in AM Relieving factors: improves with activity  PRECAUTIONS: None  RED FLAGS: None   WEIGHT BEARING RESTRICTIONS: No  FALLS: Has patient fallen in last 6 months? Yes. Number of falls 1, tripped  LIVING ENVIRONMENT: Lives with: lives alone Lives in: House/apartment Stairs: Yes: Internal: flight, not required to go up steps; bilateral but cannot reach both and External: 1 steps; none Has following equipment at home: Walker - 4 wheeled  PLOF: Independent  PATIENT GOALS: improve endurance, return to [employer] Silver[employer]neakers  OBJECTIVE:    TODAY'S TREATMENT: 07/20/23 Activity Comments  vitals Seated at rest: 131/78 mmHg, 66 bpm  LAQ 3x10 3#  Sidestep x 2 min Backwards x 2 min 3#, along counter; 96%, 84 bpm  Superset:  standing hip ABD 3# 3x10 standing hip EX 3# 3x10   Standing march x 60 sec 3# 97%, 79 bpm  2 min therapeutic rest period   balance -on foam: EO/EC x 30 sec. Head turns EO/EC x 3 reps. Sway x 60 sec ea direction. Lateral disc taps 10x ea  Seated UBE x 6 min, "hills" program for HIIT   vitals 97%, 131/68 mmHg, 67 bpm     TODAY'S TREATMENT: 07/16/23 Activity Comments  Vitals at start of session 95%, 80bpm  Nustep L5 x 6 min UEs/LEs  Cues to maintain ~ 55 SPM 97%, 88bpm  Superset:  standing hip ABD 2# 2x10 standing hip EX 2# 2x10  Cues for upright standing posture   98% spO2 91 bpm   Superset:  step ups on 6" step 2x30" with 2# romberg + head turns/nods 2x30" each  Required B UE support with step ups; mild sway and slow speed in romberg   98% spO2 80 bpm  Superset:  Sidestepping 2# 2x 1 min backwards walking  2# 2x 1 min  Cues to stand tall, pick up R foot, wider BOS with backwards walking   Superset:  standing heel raise 2x10 mini squat 2x10   Good form 98% spO2 91 bpm          PATIENT EDUCATION: Education details: discussion on cardio to increase endurance for patient's goal of walking her dog at the park, HEP update  Person educated: Patient Education method: Explanation, Demonstration, Tactile cues, Verbal cues, and Handouts Education comprehension: verbalized understanding and returned demonstration    HOME EXERCISE PROGRAM: Access Code: [email or web address] URL: [email or web address] Date: 07/16/2023 Prepared by: [facility] - Outpatient  Rehab - [facility]  Exercises - Seated Heel Toe Raises  - 1 x daily - 7 x weekly - 3 sets - 10 reps - Seated Long Arc Quad  - 1 x daily - 7 x weekly - 3 sets - 10 reps - Seated Hip Adduction Squeeze with Ball  - 1 x daily - 7 x weekly - 3 sets - 10 reps - 2 sec hold - Sit to Stand with Arms Crossed  - 1 x daily - 5 x weekly - 2 sets - 10 reps - Standing March with Counter Support  - 1 x daily - 5 x weekly - 2 sets - 20 reps - Side Stepping with Counter Support  - 1 x daily - 5 x weekly - 2 sets - 10 reps - Mini Squat with Counter Support  - 1 x daily - 7 x weekly - 5 sets - 5 reps - Standing Hip Abduction with Ankle Weight  - 1 x daily - 5 x weekly - 2 sets - 10 reps    ............................  Note: Objective measures were completed at Evaluation unless otherwise noted.  VITALS: 95%, 74 bpm, 114/70 mmHg  DIAGNOSTIC FINDINGS: EKG during hospitalization  COGNITION: Overall cognitive status: Within functional limits for tasks assessed   SENSATION: WFL  COORDINATION: NL  EDEMA:  none  MUSCLE TONE: WNL    DTRs:  NT  POSTURE: No Significant postural limitations  LOWER EXTREMITY ROM:     WNL  LOWER EXTREMITY MMT:    Grossly 4/5  BED MOBILITY:   Indep  TRANSFERS: Assistive device utilized: None  Sit to stand: Complete Independence Stand to sit: Complete Independence Chair to chair: Complete Independence Floor:  NT   CURB:  Level of Assistance: Complete Independence Assistive device utilized: None Curb Comments: requires to stop and position  STAIRS: Level of Assistance: Modified independence Stair Negotiation Technique: Step to Pattern with Bilateral Rails   GAIT: Gait pattern:  lateral trunk displacement in stance due to LBP Distance walked: 300 ft Assistive device utilized: None Level of assistance: Complete Independence Comments:   FUNCTIONAL TESTS:  5 times sit to stand: 25 sec 2 minute walk test: 300 ft Vitals pre-test: see above Vitals post-test: 98%, 79 bpm, 161/85 mmHg Rates 13/20 Borg RPE  M-CTSIB  Condition 1: Firm Surface, EO 30 Sec, Mild Sway  Condition 2: Firm Surface, EC 30 Sec, Mild and Moderate Sway  Condition 3: Foam Surface, EO 30 Sec, Mild Sway  Condition 4: Foam Surface, EC 20 Sec, Mild and Moderate Sway         GOALS: Goals reviewed with patient? Yes  SHORT TERM GOALS: Target date: 07/28/2023    Patient will be independent in HEP to improve functional outcomes Baseline: Goal status: MET  2.  Demo improved LE  strength and reduced risk for falls per 15 sec 5xSTS Test Baseline: 25 sec Goal status: IN PROGRESS    LONG TERM GOALS: Target date: 08/18/2023    Reports ability to resume Silver Sneakers program at local facility Baseline:  Goal status: IN PROGRESS  2.  Demo improved cardiovascular endurance and gait speed per distance of 450 ft during 2 MWT Baseline: 300 ft, 13/20 Borg RPE Goal status: IN PROGRESS  3.  Demo improved postural stability per mild sway x 30 sec condition 4 M-CTSIB Baseline: 20 sec mild-mod Goal status:IN PROGRESS    ASSESSMENT:  CLINICAL IMPRESSION: Session focus on progressing POC details with emphasis on greater muscular endurance  increasing weight to 3# and reps to additional set of 10 and time-based activities + 1 minute with excellent tolerance demonstrated throughout only requiring one seated therapeutic rest period. Vitals monitored throughout with commensurate heart rate to effort appreciated.  Balance activities to improve postural stability with multi-sensory conditions to improve safety with mobility and ADL with good tolerance. Ending session with steady state cardio of upper body erg for endurance and HR demands. Good tolerance and educated in relevant updates to HEP of increased resistance/repetition  OBJECTIVE IMPAIRMENTS: Abnormal gait, cardiopulmonary status limiting activity, decreased activity tolerance, decreased balance, decreased endurance, difficulty walking, and decreased strength.   ACTIVITY LIMITATIONS: carrying, lifting, standing, stairs, and locomotion level  PARTICIPATION LIMITATIONS: meal prep, cleaning, laundry, shopping, community activity, and yard work  PERSONAL FACTORS: Age, Past/current experiences, Time since onset of injury/illness/exacerbation, and 1-2 comorbidities: PMH  are also affecting patient's functional outcome.   REHAB POTENTIAL: Excellent  CLINICAL DECISION MAKING: Stable/uncomplicated  EVALUATION COMPLEXITY: Low  PLAN:  PT FREQUENCY: 1-2x/week  PT DURATION: 6 weeks  PLANNED INTERVENTIONS: Therapeutic exercises, Therapeutic activity, Neuromuscular re-education, Balance training, Gait training, Patient/Family education, Self Care, Joint mobilization, Stair training, Vestibular training, Canalith repositioning, DME instructions, Aquatic Therapy, Dry Needling, Electrical stimulation, Spinal mobilization, Moist heat, and Manual therapy  PLAN FOR NEXT SESSION: working for muscular endurance    10:27 AM, 07/20/23 M. Shary Decamp, PT, DPT Physical Therapist- [place] Office Number: [phone]

## 2023-07-21 IMAGING — CT CT ABD-PEL WO/W CM
2 of 6 series · 13 of 32 positions shown, 18 images · IV contrast (agent unspecified)
Comparison: None.

CLINICAL DATA: Gross hematuria.  Bilateral groin pressure.

EXAM:
CT ABDOMEN AND PELVIS WITHOUT AND WITH CONTRAST
TECHNIQUE: Multidetector CT imaging of the abdomen and pelvis was performed
following the standard protocol before and following the bolus
administration of intravenous contrast.

[Series 2: w/o supine 5.0 · axial · non-contrast · 0.98mm/px · z∈[-513,-193]mm · 6 of 90 slices shown]
[im 13/90  soft-tissue]
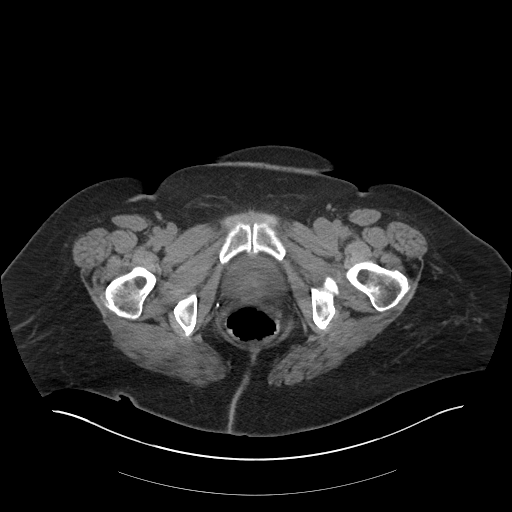
[im 26/90  soft-tissue]
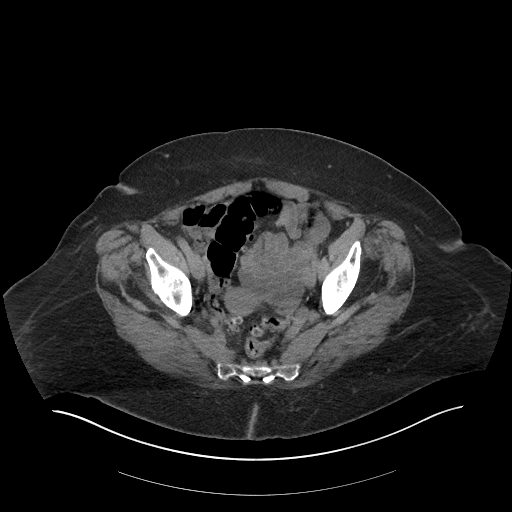
[im 39/90  soft-tissue]
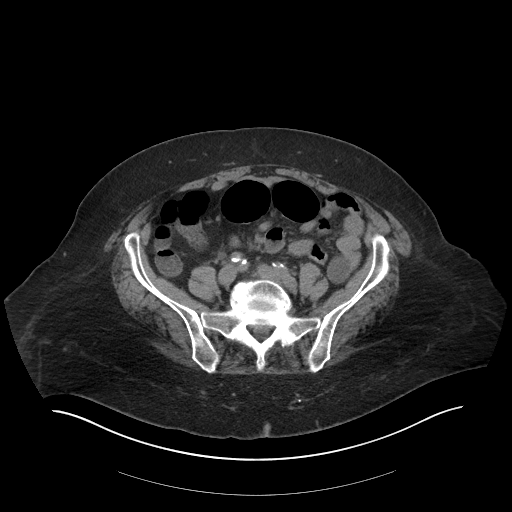
[im 51/90  soft-tissue]
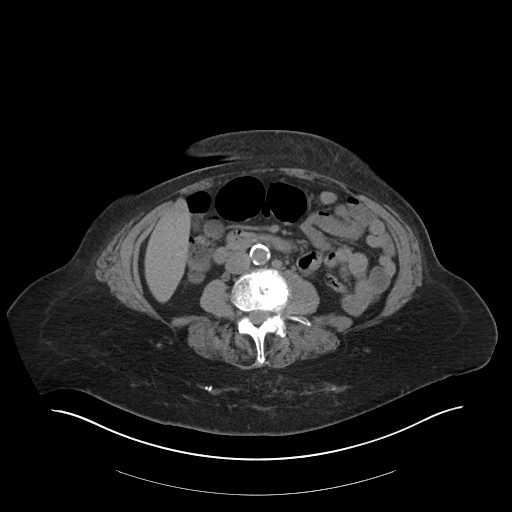
[im 64/90  soft-tissue]
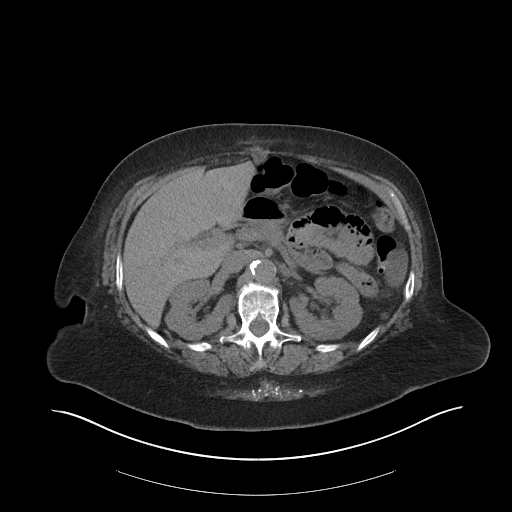
[im 77/90  soft-tissue]
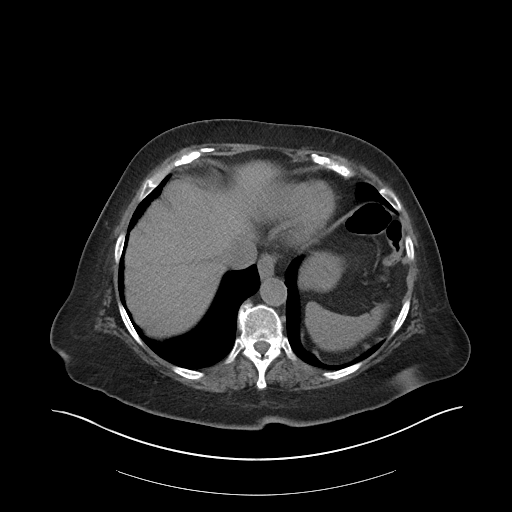

[Series 11: with prone 5.0 · axial · 0.98mm/px · z∈[-412,-52]mm · 7 of 97 slices shown, 12 images]
[im 13/97  soft-tissue]
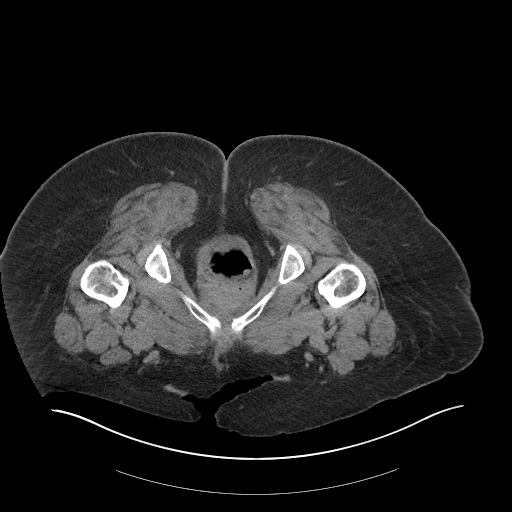
[im 13/97  bone]
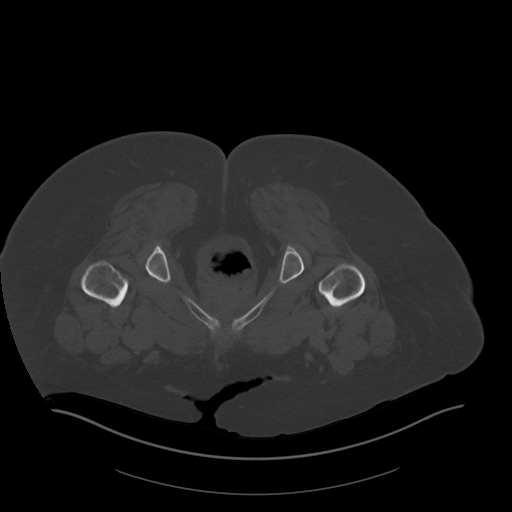
[im 25/97  soft-tissue]
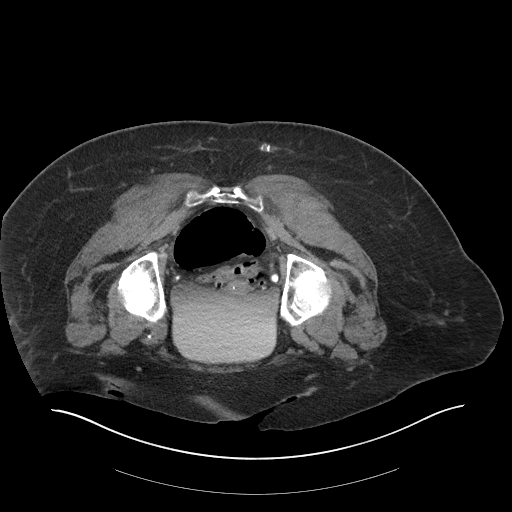
[im 37/97  soft-tissue]
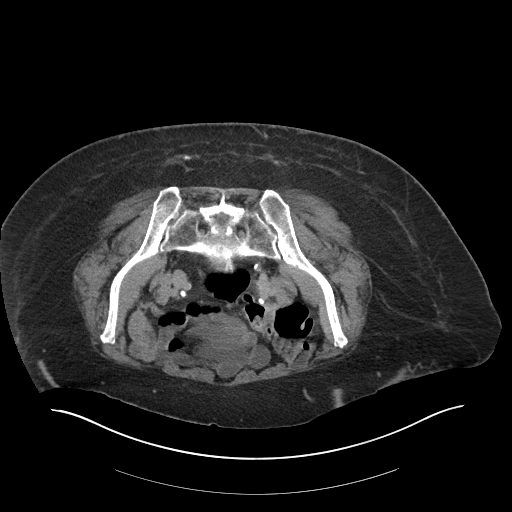
[im 49/97  soft-tissue]
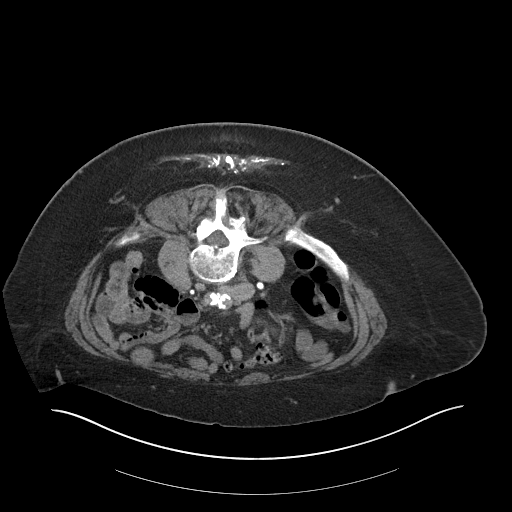
[im 49/97  lung]
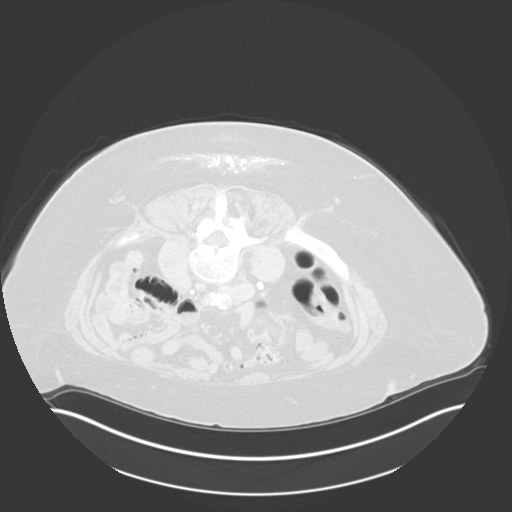
[im 61/97  soft-tissue]
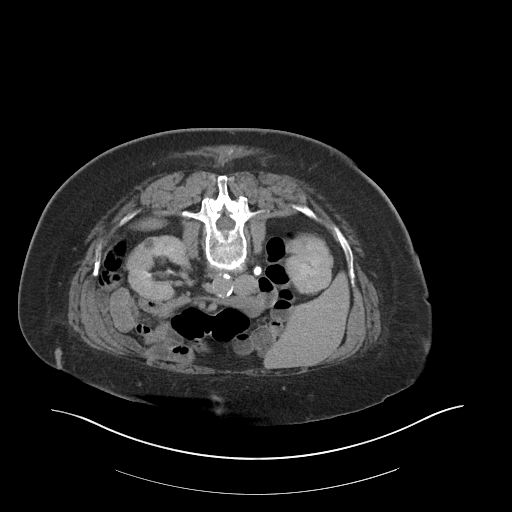
[im 61/97  lung]
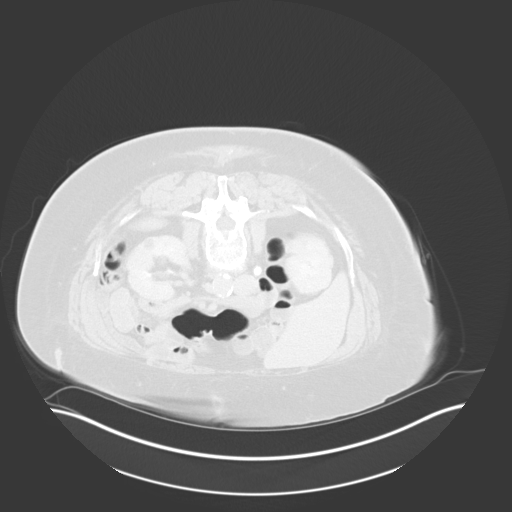
[im 73/97  soft-tissue]
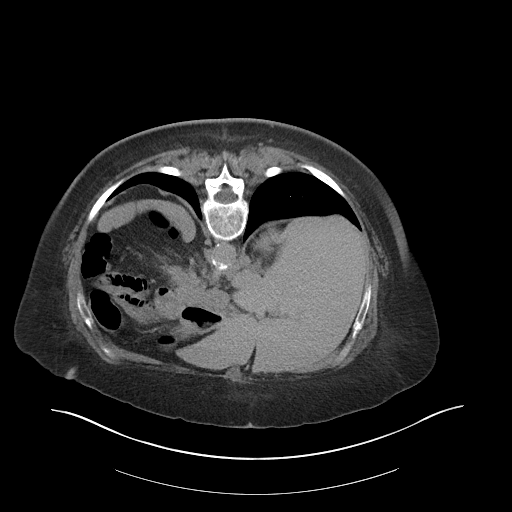
[im 73/97  lung]
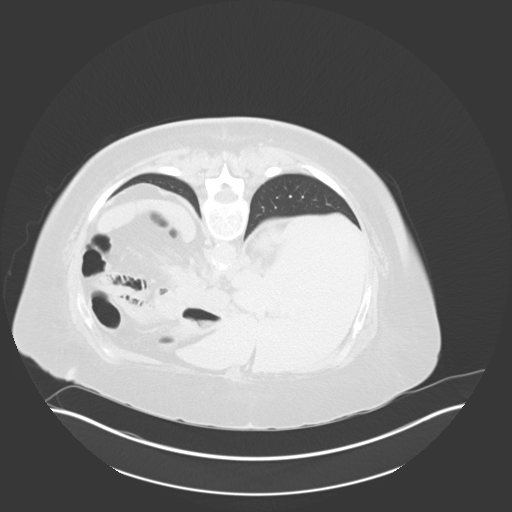
[im 85/97  soft-tissue]
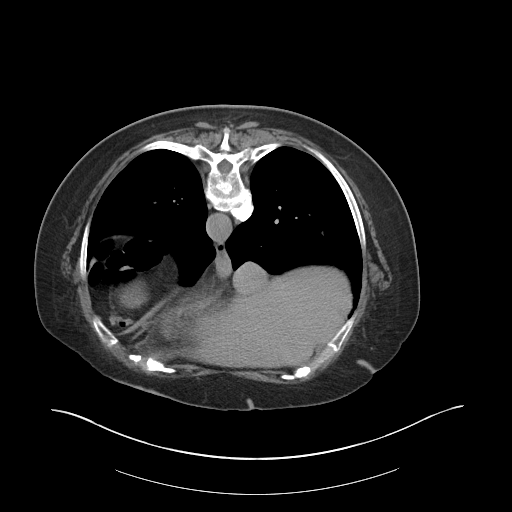
[im 85/97  lung]
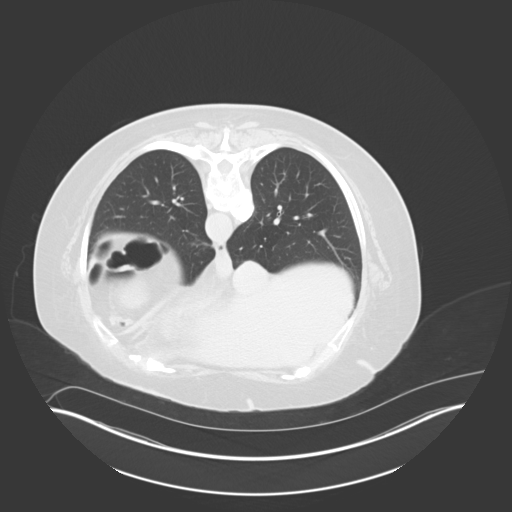

[13 of 32 positions shown; findings below may reference images not displayed]

RADIATION DOSE REDUCTION: This exam was performed according to the
departmental dose-optimization program which includes automated
exposure control, adjustment of the mA and/or kV according to
patient size and/or use of iterative reconstruction technique.

CONTRAST:  100mL L6A4Z6-GHH IOPAMIDOL ([id number]-GHH) INJECTION 61%
FINDINGS: Lower chest: No significant pulmonary nodules or acute consolidative
airspace disease.

Hepatobiliary: Normal liver size. No liver mass. Cholecystectomy. No
biliary ductal dilatation.

Pancreas: Normal, with no mass or duct dilation.

Spleen: Normal size. No mass.

Adrenals/Urinary Tract: Normal adrenals. No renal stones. No
hydronephrosis. Small simple parapelvic renal cysts in both kidneys.
Simple 1.3 cm renal cortical cyst in the posterior interpolar left
kidney. No suspicious renal cortical masses. Mild-to-moderate patchy
renal cortical scarring throughout both kidneys. Normal caliber
ureters. No ureteral stones. On delayed imaging, there is no
urothelial wall thickening and there are no filling defects in the
opacified portions of the bilateral collecting systems or ureters.
No bladder stones, masses, wall thickening or diverticula.

Stomach/Bowel: Normal non-distended stomach. Normal caliber small
bowel with no small bowel wall thickening. Appendix not discretely
visualized. Moderate diffuse colonic diverticulosis, most prominent
in the sigmoid colon, with no large bowel wall thickening or
significant pericolonic fat stranding.

Vascular/Lymphatic: Atherosclerotic nonaneurysmal abdominal aorta.
Patent portal, splenic, hepatic and renal veins. No pathologically
enlarged lymph nodes in the abdomen or pelvis.

Reproductive: Apparent mild thickening of the endometrium (7 mm). No
adnexal masses.

Other: No pneumoperitoneum, ascites or focal fluid collection.

Musculoskeletal: No aggressive appearing focal osseous lesions.
Marked thoracolumbar spondylosis.
IMPRESSION: 1. No urolithiasis. No hydronephrosis. No suspicious renal cortical
masses. No evidence of urothelial lesions.
2. Mild-to-moderate patchy renal cortical scarring throughout both
kidneys.
3. Apparent mild thickening of the endometrium (7 mm). Recommend
further evaluation with transabdominal and transvaginal pelvic
ultrasound at this time.
4. Moderate diffuse colonic diverticulosis.
5. Aortic Atherosclerosis ([id number]).

These results will be called to the ordering clinician or
representative by the [HOSPITAL] at the imaging location.

## 2023-07-23 ENCOUNTER — Ambulatory Visit: Payer: Medicare Other | Admitting: Physical Therapy

## 2023-07-23 ENCOUNTER — Encounter: Payer: Self-pay | Admitting: Physical Therapy

## 2023-07-23 DIAGNOSIS — M6281 Muscle weakness (generalized): Secondary | ICD-10-CM

## 2023-07-23 DIAGNOSIS — R2689 Other abnormalities of gait and mobility: Secondary | ICD-10-CM | POA: Diagnosis not present

## 2023-07-23 DIAGNOSIS — R2681 Unsteadiness on feet: Secondary | ICD-10-CM | POA: Diagnosis not present

## 2023-07-23 DIAGNOSIS — I4819 Other persistent atrial fibrillation: Secondary | ICD-10-CM | POA: Diagnosis not present

## 2023-07-26 NOTE — Therapy (Signed)
OUTPATIENT PHYSICAL THERAPY NEURO TREATMENT   Patient Name: Kristina [name] MRN: [id number] DOB:05-21-1946, 77 y.o., female Today's Date: 07/28/2023   PCP: Sigmund Hazel, MD REFERRING PROVIDER: Mealor, Roberts Gaudy, MD  END OF SESSION:  PT End of Session - 07/28/23 1012     Visit Number 6    Number of Visits 8    Date for PT Re-Evaluation 08/18/23    Authorization Type [employer]    PT Start Time 0930    PT Stop Time 1015    PT Time Calculation (min) 45 min    Equipment Utilized During Treatment Gait belt    Activity Tolerance Patient tolerated treatment well    Behavior During Therapy WFL for tasks assessed/performed                 Past Medical History:  Diagnosis Date   Allergic rhinitis    Anemia glaucoma   Anxiety    Atrial fibrillation (HCC)    Back pain    Carotid arterial disease (HCC)    Cataract    Mixed OU   DDD (degenerative disc disease), lumbar    Family history of adverse reaction to anesthesia    daughter has PONV   Heart murmur    ECHO 10/23/22   High blood pressure    High cholesterol    History of stomach ulcers    Hypertensive retinopathy    OU   Rheumatic fever    Rheumatic fever    Rheumatoid arteritis (HCC)    Past Surgical History:  Procedure Laterality Date   APPENDECTOMY  1955   CARDIOVERSION N/A 02/25/2023   Procedure: CARDIOVERSION;  Surgeon: Jodelle Red, MD;  Location: [facility] INVASIVE CV LAB;  Service: Cardiovascular;  Laterality: N/A;   CHOLECYSTECTOMY  2006   COLONOSCOPY  2023   DILATATION & CURETTAGE/HYSTEROSCOPY WITH MYOSURE N/A 04/15/2022   Procedure: DILATATION & CURETTAGE/HYSTEROSCOPY WITH MYOSURE;  Surgeon: Gerald Leitz, MD;  Location: [facility] OR;  Service: Gynecology;  Laterality: N/A;   ENDARTERECTOMY Left 09/27/2019   Procedure: ENDARTERECTOMY CAROTID LEFT;  Surgeon: Nada Libman, MD;  Location: [facility] OR;  Service: Vascular;  Laterality: Left;   HERNIA REPAIR  2007   incisional hernia after  cholecystecomy   HERNIA REPAIR  2015   incisional hernia   PATCH ANGIOPLASTY Left 09/27/2019   Procedure: Patch Angioplasty Using Livia Snellen;  Surgeon: Nada Libman, MD;  Location: [facility] OR;  Service: Vascular;  Laterality: Left;   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   TONSILLECTOMY  1963   removed at 77 years old   Patient Active Problem List   Diagnosis Date Noted   Persistent atrial fibrillation (HCC) 03/09/2023   Hypercoagulable state due to persistent atrial fibrillation (HCC) 03/09/2023   Postmenopausal bleeding 04/15/2022   Endometrial polyp 04/15/2022   Prediabetes 10/27/2021   Anxiety 01/10/2021   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   Degenerative joint disease involving multiple joints 01/10/2021   Other allergy status, other than to drugs and biological substances 01/10/2021   Personal history of transient ischemic attack (TIA), and cerebral infarction without residual deficits 01/10/2021   Pure hypercholesterolemia 01/10/2021   Unspecified urinary incontinence 01/10/2021   Visual loss 01/10/2021   Vitamin D deficiency, unspecified 01/10/2021   Aortic stenosis 12/22/2019   Left carotid artery stenosis 09/27/2019   Carotid artery disease (HCC) 09/22/2019   Class 3 severe obesity with serious comorbidity and body mass index (BMI) of 40.0 to 44.9 in adult (HCC) 12/15/2018   Chronic back pain  11/21/2018   Hyperlipidemia 02/24/2017   Essential hypertension 02/24/2017    ONSET DATE: 06/28/23  REFERRING DIAG: I48.19 (ICD-10-CM) - Persistent atrial fibrillation (HCC)  THERAPY DIAG:  Muscle weakness (generalized)  Other abnormalities of gait and mobility  Unsteadiness on feet  Rationale for Evaluation and Treatment: Rehabilitation  SUBJECTIVE:                                                                                                                                                                                             SUBJECTIVE STATEMENT: Going good.  Waiting to hear back from her MD about returning to gym as it relates to her hernia.   Pt accompanied by: self  PERTINENT HISTORY: 77 y.o. female with a history of aortic stenosis, aortic atherosclerosis, carotid artery disease, HTN, HLD, OSA no longer on CPAP, and persistent atrial fibrillation, L TKR, R knee OA.Recent hospital admission for Tikosyn loading  PAIN:  Are you having pain? Yes: NPRS scale: 0/10 Pain location: low back. midline Pain description: ache, sore Aggravating factors: worse in AM Relieving factors: improves with activity  PRECAUTIONS: None  RED FLAGS: None   WEIGHT BEARING RESTRICTIONS: No  FALLS: Has patient fallen in last 6 months? Yes. Number of falls 1, tripped  LIVING ENVIRONMENT: Lives with: lives alone Lives in: House/apartment Stairs: Yes: Internal: flight, not required to go up steps; bilateral but cannot reach both and External: 1 steps; none Has following equipment at home: Walker - 4 wheeled  PLOF: Independent  PATIENT GOALS: improve endurance, return to [employer]  OBJECTIVE:     TODAY'S TREATMENT: 07/28/23 Activity Comments  Vitals at start of session 97% spO2, 83 bpm   Nustep L6 x 6 min UEs/LEs  97% spO2, 91 bpm   Superset: 2 sets  pulley rows 20# 10x  high knee march with 7# in 1 hand 1 min CGA for safety  96% spO2, 90 bpm   Superset:  2 sets  standing hip ABD 5# 10x  standing hip EX 5# 10x  Slight anterior lean on counter; knee bent with extension  97% spO2, 85 bpm   Superset: 2 sets  step ups 10 with 7# and 1 handrail support 10x  alt toe tap to cone 20x  Light UE support with toe taps required  97% spO2, 88 bpm   Superset:  2 sets  monster walks red TB along counter 1 min Walking backwards along counter 1 min 1 UE support on counter  97% spO2, 82 bpm     HOME EXERCISE PROGRAM: Access Code: [email or web address] URL: [email or web address] Date: 07/16/2023 Prepared by: [facility] - Outpatient  Rehab -  [facility]  Exercises - Seated Heel Toe Raises  - 1 x daily - 7 x weekly - 3 sets - 10 reps - Seated Long Arc Quad  - 1 x daily - 7 x weekly - 3 sets - 10 reps - Seated Hip Adduction Squeeze with Ball  - 1 x daily - 7 x weekly - 3 sets - 10 reps - 2 sec hold - Sit to Stand with Arms Crossed  - 1 x daily - 5 x weekly - 2 sets - 10 reps - Standing March with Counter Support  - 1 x daily - 5 x weekly - 2 sets - 20 reps - Side Stepping with Counter Support  - 1 x daily - 5 x weekly - 2 sets - 10 reps - Mini Squat with Counter Support  - 1 x daily - 7 x weekly - 5 sets - 5 reps - Standing Hip Abduction with Ankle Weight  - 1 x daily - 5 x weekly - 2 sets - 10 reps     ............................  Note: Objective measures were completed at Evaluation unless otherwise noted.  VITALS: 95%, 74 bpm, 114/70 mmHg  DIAGNOSTIC FINDINGS: EKG during hospitalization  COGNITION: Overall cognitive status: Within functional limits for tasks assessed   SENSATION: WFL  COORDINATION: NL  EDEMA:  none  MUSCLE TONE: WNL    DTRs:  NT  POSTURE: No Significant postural limitations  LOWER EXTREMITY ROM:     WNL  LOWER EXTREMITY MMT:    Grossly 4/5  BED MOBILITY:  Indep  TRANSFERS: Assistive device utilized: None  Sit to stand: Complete Independence Stand to sit: Complete Independence Chair to chair: Complete Independence Floor:  NT   CURB:  Level of Assistance: Complete Independence Assistive device utilized: None Curb Comments: requires to stop and position  STAIRS: Level of Assistance: Modified independence Stair Negotiation Technique: Step to Pattern with Bilateral Rails   GAIT: Gait pattern:  lateral trunk displacement in stance due to LBP Distance walked: 300 ft Assistive device utilized: None Level of assistance: Complete Independence Comments:   FUNCTIONAL TESTS:  5 times sit to stand: 25 sec 2 minute walk test: 300 ft Vitals pre-test: see  above Vitals post-test: 98%, 79 bpm, 161/85 mmHg Rates 13/20 Borg RPE  M-CTSIB  Condition 1: Firm Surface, EO 30 Sec, Mild Sway  Condition 2: Firm Surface, EC 30 Sec, Mild and Moderate Sway  Condition 3: Foam Surface, EO 30 Sec, Mild Sway  Condition 4: Foam Surface, EC 20 Sec, Mild and Moderate Sway         GOALS: Goals reviewed with patient? Yes  SHORT TERM GOALS: Target date: 07/28/2023    Patient will be independent in HEP to improve functional outcomes Baseline: Goal status: MET  2.  Demo improved LE strength and reduced risk for falls per 15 sec 5xSTS Test Baseline: 25 sec Goal status: IN PROGRESS    LONG TERM GOALS: Target date: 08/18/2023    Reports ability to resume Silver Sneakers program at local facility Baseline:  Goal status: IN PROGRESS  2.  Demo improved cardiovascular endurance and gait speed per distance of 450 ft during 2 MWT Baseline: 300 ft, 13/20 Borg RPE Goal status: IN PROGRESS  3.  Demo improved postural stability per mild sway x 30 sec condition 4 M-CTSIB Baseline: 20 sec mild-mod Goal status:IN PROGRESS    ASSESSMENT:  CLINICAL IMPRESSION: Patient arrived to session without complaints. Monitored vitals throughout session to ensure normal response to  exercise. More challenging strengthening activities with balance challenges were performed, with occasional CGA for safety. Patient is able to perform activities with increased weight compared to initial sessions. Tolerated session without complaints; less frequent rest breaks required d/t improving endurance.   OBJECTIVE IMPAIRMENTS: Abnormal gait, cardiopulmonary status limiting activity, decreased activity tolerance, decreased balance, decreased endurance, difficulty walking, and decreased strength.   ACTIVITY LIMITATIONS: carrying, lifting, standing, stairs, and locomotion level  PARTICIPATION LIMITATIONS: meal prep, cleaning, laundry, shopping, community activity, and yard  work  PERSONAL FACTORS: Age, Past/current experiences, Time since onset of injury/illness/exacerbation, and 1-2 comorbidities: PMH  are also affecting patient's functional outcome.   REHAB POTENTIAL: Excellent  CLINICAL DECISION MAKING: Stable/uncomplicated  EVALUATION COMPLEXITY: Low  PLAN:  PT FREQUENCY: 1-2x/week  PT DURATION: 6 weeks  PLANNED INTERVENTIONS: Therapeutic exercises, Therapeutic activity, Neuromuscular re-education, Balance training, Gait training, Patient/Family education, Self Care, Joint mobilization, Stair training, Vestibular training, Canalith repositioning, DME instructions, Aquatic Therapy, Dry Needling, Electrical stimulation, Spinal mobilization, Moist heat, and Manual therapy  PLAN FOR NEXT SESSION: working for muscular endurance    Anette Guarneri, PT, DPT 07/28/23 10:15 AM  [facility] at [facility] [address], [address] [place], [place] [address] Phone # [phone] Fax # [phone]

## 2023-07-27 ENCOUNTER — Other Ambulatory Visit: Payer: Self-pay

## 2023-07-27 DIAGNOSIS — I6523 Occlusion and stenosis of bilateral carotid arteries: Secondary | ICD-10-CM

## 2023-07-27 NOTE — Telephone Encounter (Signed)
Received surgical clearance again for procedure below.  They are wanting to see if pt is cleared.  They are asking clearance for medical and pt to hold Eliquis X's 2 days.  See below and advise.

## 2023-07-27 NOTE — Telephone Encounter (Signed)
     Primary Cardiologist: Nanetta Batty, MD  Chart reviewed as part of pre-operative protocol coverage. Given past medical history and time since last visit, based on ACC/AHA guidelines, Briggette Katt would be at acceptable risk for the planned procedure without further cardiovascular testing.   Patient with diagnosis of afib diagnosed 01/2023 on Eliquis for anticoagulation.     Procedure: right carotid endarterectomy Date of procedure: 08/18/23   CHA2DS2-VASc Score = 6  This indicates a 9.7% annual risk of stroke. [name] score is based upon: CHF History: 1 HTN History: 1 Diabetes History: 0 Stroke History: 0 Vascular Disease History: 1 Age Score: 2 Gender Score: 1   Underwent DCCV 02/25/23. Underwent Tikosyn load 9/23-9/26/24. Sees Dr [name] again 12/4, prior note mentioned potential ablation if she lost weight.   CrCl 57mL/min using adjusted body weight Platelet count 341K   Per office protocol, patient can hold Eliquis for 2 days prior to procedure as requested.     I will route this recommendation to the requesting party via Epic fax function and remove from pre-op pool.  Please call with questions.  Thomasene Ripple. Shonia Skilling NP-C     07/27/2023, 1:18 PM [facility] [address] Office [phone] Fax [phone]

## 2023-07-27 NOTE — Telephone Encounter (Signed)
Patient with diagnosis of afib diagnosed 01/2023 on Eliquis for anticoagulation.    Procedure: right carotid endarterectomy Date of procedure: 08/18/23  CHA2DS2-VASc Score = 6  This indicates a 9.7% annual risk of stroke. [name] score is based upon: CHF History: 1 HTN History: 1 Diabetes History: 0 Stroke History: 0 Vascular Disease History: 1 Age Score: 2 Gender Score: 1   Underwent DCCV 02/25/23. Underwent Tikosyn load 9/23-9/26/24. Sees Dr [name] again 12/4, prior note mentioned potential ablation if she lost weight.  CrCl 52mL/min using adjusted body weight Platelet count 341K  Per office protocol, patient can hold Eliquis for 2 days prior to procedure as requested.    **This guidance is not considered finalized until pre-operative APP has relayed final recommendations.**

## 2023-07-28 ENCOUNTER — Ambulatory Visit: Payer: Medicare Other | Admitting: Physical Therapy

## 2023-07-28 ENCOUNTER — Encounter: Payer: Self-pay | Admitting: Physical Therapy

## 2023-07-28 DIAGNOSIS — R2681 Unsteadiness on feet: Secondary | ICD-10-CM | POA: Diagnosis not present

## 2023-07-28 DIAGNOSIS — M6281 Muscle weakness (generalized): Secondary | ICD-10-CM | POA: Diagnosis not present

## 2023-07-28 DIAGNOSIS — R2689 Other abnormalities of gait and mobility: Secondary | ICD-10-CM | POA: Diagnosis not present

## 2023-07-28 DIAGNOSIS — I4819 Other persistent atrial fibrillation: Secondary | ICD-10-CM | POA: Diagnosis not present

## 2023-08-04 ENCOUNTER — Ambulatory Visit: Payer: Medicare Other

## 2023-08-04 DIAGNOSIS — R2681 Unsteadiness on feet: Secondary | ICD-10-CM

## 2023-08-04 DIAGNOSIS — M6281 Muscle weakness (generalized): Secondary | ICD-10-CM | POA: Diagnosis not present

## 2023-08-04 DIAGNOSIS — R2689 Other abnormalities of gait and mobility: Secondary | ICD-10-CM | POA: Diagnosis not present

## 2023-08-04 DIAGNOSIS — I4819 Other persistent atrial fibrillation: Secondary | ICD-10-CM | POA: Diagnosis not present

## 2023-08-04 NOTE — Therapy (Signed)
OUTPATIENT PHYSICAL THERAPY NEURO TREATMENT   Patient Name: Kristina [name] MRN: [id number] DOB:[birth year], 77 y.o., female Today's Date: 08/04/2023   PCP: Sigmund Hazel, MD REFERRING PROVIDER: Mealor, Roberts Gaudy, MD  END OF SESSION:  PT End of Session - 08/04/23 0929     Visit Number 7    Number of Visits 8    Date for PT Re-Evaluation 08/18/23    Authorization Type [employer]    PT Start Time 0930    PT Stop Time 1015    PT Time Calculation (min) 45 min    Equipment Utilized During Treatment Gait belt    Activity Tolerance Patient tolerated treatment well    Behavior During Therapy WFL for tasks assessed/performed                 Past Medical History:  Diagnosis Date   Allergic rhinitis    Anemia glaucoma   Anxiety    Atrial fibrillation (HCC)    Back pain    Carotid arterial disease (HCC)    Cataract    Mixed OU   DDD (degenerative disc disease), lumbar    Family history of adverse reaction to anesthesia    daughter has PONV   Heart murmur    ECHO 10/23/22   High blood pressure    High cholesterol    History of stomach ulcers    Hypertensive retinopathy    OU   Rheumatic fever    Rheumatic fever    Rheumatoid arteritis (HCC)    Past Surgical History:  Procedure Laterality Date   APPENDECTOMY  1955   CARDIOVERSION N/A 02/25/2023   Procedure: CARDIOVERSION;  Surgeon: Jodelle Red, MD;  Location: [facility] INVASIVE CV LAB;  Service: Cardiovascular;  Laterality: N/A;   CHOLECYSTECTOMY  2006   COLONOSCOPY  2023   DILATATION & CURETTAGE/HYSTEROSCOPY WITH MYOSURE N/A 04/15/2022   Procedure: DILATATION & CURETTAGE/HYSTEROSCOPY WITH MYOSURE;  Surgeon: Gerald Leitz, MD;  Location: 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   TONSILLECTOMY  1963   removed at 77 years old   Patient Active Problem List   Diagnosis Date Noted   Persistent atrial fibrillation (HCC) 03/09/2023   Hypercoagulable state due to persistent atrial fibrillation (HCC) 03/09/2023   Postmenopausal bleeding 04/15/2022   Endometrial polyp 04/15/2022   Prediabetes 10/27/2021   Anxiety 01/10/2021   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   Degenerative joint disease involving multiple joints 01/10/2021   Other allergy status, other than to drugs and biological substances 01/10/2021   Personal history of transient ischemic attack (TIA), and cerebral infarction without residual deficits 01/10/2021   Pure hypercholesterolemia 01/10/2021   Unspecified urinary incontinence 01/10/2021   Visual loss 01/10/2021   Vitamin D deficiency, unspecified 01/10/2021   Aortic stenosis 12/22/2019   Left carotid artery stenosis 09/27/2019   Carotid artery disease (HCC) 09/22/2019   Class 3 severe obesity with serious comorbidity and body mass index (BMI) of 40.0 to 44.9 in adult (HCC) 12/15/2018   Chronic back pain  11/21/2018   Hyperlipidemia 02/24/2017   Essential hypertension 02/24/2017    ONSET DATE: 06/28/23  REFERRING DIAG: I48.19 (ICD-10-CM) - Persistent atrial fibrillation (HCC)  THERAPY DIAG:  Muscle weakness (generalized)  Other abnormalities of gait and mobility  Unsteadiness on feet  Rationale for Evaluation and Treatment: Rehabilitation  SUBJECTIVE:                                                                                                                                                                                             SUBJECTIVE STATEMENT: Doing good. No  new issues. Working on walking endurance, seems a little better  Pt accompanied by: self  PERTINENT HISTORY: 77 y.o. female with a history of aortic stenosis, aortic atherosclerosis, carotid artery disease, HTN, HLD, OSA no longer on CPAP, and persistent atrial fibrillation, L TKR, R knee OA.Recent hospital admission for Tikosyn loading  PAIN:  Are you having pain? Yes: NPRS scale: 0/10 Pain location: low back. midline Pain description: ache, sore Aggravating factors: worse in AM Relieving factors: improves with activity  PRECAUTIONS: None  RED FLAGS: None   WEIGHT BEARING RESTRICTIONS: No  FALLS: Has patient fallen in last 6 months? Yes. Number of falls 1, tripped  LIVING ENVIRONMENT: Lives with: lives alone Lives in: House/apartment Stairs: Yes: Internal: flight, not required to go up steps; bilateral but cannot reach both and External: 1 steps; none Has following equipment at home: Walker - 4 wheeled  PLOF: Independent  PATIENT GOALS: improve endurance, return to [employer]  OBJECTIVE:    TODAY'S TREATMENT: 08/04/23 Activity Comments  Vitals: 97%, 77 bpm   LAQ 3x10 5#  Superset:  3 sets  standing hip ABD 5# 10x  standing hip EX 5# 10x    Alt stair taps 30x 5#, LUE support   Seated cable row 3x10 20#  Lateral step-ups 2x10 6" box, unilat UE support. Exertion with effort, HR to 100 bpm  Monster walk x 2 min Red loop around ankles  Sidestepping x 2 min Red loop around ankles  Gastroc stretch 1x60 sec      TODAY'S TREATMENT: 07/28/23 Activity Comments  Vitals at start of session 97% spO2, 83 bpm   Nustep L6 x 6 min UEs/LEs  97% spO2, 91 bpm   Superset: 2 sets  pulley rows 20# 10x  high knee march with 7# in 1 hand 1 min CGA for safety  96% spO2, 90 bpm   Superset:  2 sets  standing hip ABD 5# 10x  standing hip EX 5# 10x  Slight anterior lean on counter; knee bent with extension  97% spO2, 85 bpm  Superset: 2 sets  step ups 10 with 7# and  1 handrail support 10x  alt toe tap to cone 20x  Light UE support with toe taps required  97% spO2, 88 bpm   Superset:  2 sets  monster walks red TB along counter 1 min Walking backwards along counter 1 min 1 UE support on counter  97% spO2, 82 bpm     HOME EXERCISE PROGRAM: Access Code: [email or web address] URL: [email or web address] Date: 07/16/2023 Prepared by: [facility] - Outpatient  Rehab - [facility]  Exercises - Seated Heel Toe Raises  - 1 x daily - 7 x weekly - 3 sets - 10 reps - Seated Long Arc Quad  - 1 x daily - 7 x weekly - 3 sets - 10 reps - Seated Hip Adduction Squeeze with Ball  - 1 x daily - 7 x weekly - 3 sets - 10 reps - 2 sec hold - Sit to Stand with Arms Crossed  - 1 x daily - 5 x weekly - 2 sets - 10 reps - Standing March with Counter Support  - 1 x daily - 5 x weekly - 2 sets - 20 reps - Side Stepping with Counter Support  - 1 x daily - 5 x weekly - 2 sets - 10 reps - Mini Squat with Counter Support  - 1 x daily - 7 x weekly - 5 sets - 5 reps - Standing Hip Abduction with Ankle Weight  - 1 x daily - 5 x weekly - 2 sets - 10 reps     ............................  Note: Objective measures were completed at Evaluation unless otherwise noted.  VITALS: 95%, 74 bpm, 114/70 mmHg  DIAGNOSTIC FINDINGS: EKG during hospitalization  COGNITION: Overall cognitive status: Within functional limits for tasks assessed   SENSATION: WFL  COORDINATION: NL  EDEMA:  none  MUSCLE TONE: WNL    DTRs:  NT  POSTURE: No Significant postural limitations  LOWER EXTREMITY ROM:     WNL  LOWER EXTREMITY MMT:    Grossly 4/5  BED MOBILITY:  Indep  TRANSFERS: Assistive device utilized: None  Sit to stand: Complete Independence Stand to sit: Complete Independence Chair to chair: Complete Independence Floor:  NT   CURB:  Level of Assistance: Complete Independence Assistive device utilized: None Curb Comments: requires to stop and  position  STAIRS: Level of Assistance: Modified independence Stair Negotiation Technique: Step to Pattern with Bilateral Rails   GAIT: Gait pattern:  lateral trunk displacement in stance due to LBP Distance walked: 300 ft Assistive device utilized: None Level of assistance: Complete Independence Comments:   FUNCTIONAL TESTS:  5 times sit to stand: 25 sec 2 minute walk test: 300 ft Vitals pre-test: see above Vitals post-test: 98%, 79 bpm, 161/85 mmHg Rates 13/20 Borg RPE  M-CTSIB  Condition 1: Firm Surface, EO 30 Sec, Mild Sway  Condition 2: Firm Surface, EC 30 Sec, Mild and Moderate Sway  Condition 3: Foam Surface, EO 30 Sec, Mild Sway  Condition 4: Foam Surface, EC 20 Sec, Mild and Moderate Sway         GOALS: Goals reviewed with patient? Yes  SHORT TERM GOALS: Target date: 07/28/2023    Patient will be independent in HEP to improve functional outcomes Baseline: Goal status: MET  2.  Demo improved LE strength and reduced risk for falls per 15 sec 5xSTS Test Baseline: 25 sec Goal status: IN PROGRESS    LONG TERM GOALS: Target date: 08/18/2023    Reports  ability to resume Silver Sneakers program at local facility Baseline:  Goal status: IN PROGRESS  2.  Demo improved cardiovascular endurance and gait speed per distance of 450 ft during 2 MWT Baseline: 300 ft, 13/20 Borg RPE Goal status: IN PROGRESS  3.  Demo improved postural stability per mild sway x 30 sec condition 4 M-CTSIB Baseline: 20 sec mild-mod Goal status:IN PROGRESS    ASSESSMENT:  CLINICAL IMPRESSION: Patient arrived to session without complaints. Monitored vitals throughout session to ensure normal response to exercise. Addition to current POC details tolerating increased sets with current weight without issue and rating 5-6/10 RPE. Body weight lateral step-ups performed with exertion and rating effort 8/10 RPE and HR of 100 bpm.  Encouraged use of rollator when performing outdoor  ambulation to enable faster gait speed and stability for right knee, verbalizes understanding.  Re-assess and D/C at next session  OBJECTIVE IMPAIRMENTS: Abnormal gait, cardiopulmonary status limiting activity, decreased activity tolerance, decreased balance, decreased endurance, difficulty walking, and decreased strength.   ACTIVITY LIMITATIONS: carrying, lifting, standing, stairs, and locomotion level  PARTICIPATION LIMITATIONS: meal prep, cleaning, laundry, shopping, community activity, and yard work  PERSONAL FACTORS: Age, Past/current experiences, Time since onset of injury/illness/exacerbation, and 1-2 comorbidities: PMH  are also affecting patient's functional outcome.   REHAB POTENTIAL: Excellent  CLINICAL DECISION MAKING: Stable/uncomplicated  EVALUATION COMPLEXITY: Low  PLAN:  PT FREQUENCY: 1-2x/week  PT DURATION: 6 weeks  PLANNED INTERVENTIONS: Therapeutic exercises, Therapeutic activity, Neuromuscular re-education, Balance training, Gait training, Patient/Family education, Self Care, Joint mobilization, Stair training, Vestibular training, Canalith repositioning, DME instructions, Aquatic Therapy, Dry Needling, Electrical stimulation, Spinal mobilization, Moist heat, and Manual therapy  PLAN FOR NEXT SESSION: D/C assessment   10:19 AM, 08/04/23 M. Shary Decamp, PT, DPT Physical Therapist- [place] Office Number: [phone]

## 2023-08-11 ENCOUNTER — Ambulatory Visit: Payer: Medicare Other | Attending: Cardiovascular Disease

## 2023-08-11 DIAGNOSIS — R2689 Other abnormalities of gait and mobility: Secondary | ICD-10-CM | POA: Insufficient documentation

## 2023-08-11 DIAGNOSIS — M6281 Muscle weakness (generalized): Secondary | ICD-10-CM | POA: Insufficient documentation

## 2023-08-11 DIAGNOSIS — R2681 Unsteadiness on feet: Secondary | ICD-10-CM | POA: Diagnosis not present

## 2023-08-11 NOTE — Therapy (Signed)
OUTPATIENT PHYSICAL THERAPY NEURO TREATMENT and D/C Summary   Patient Name: Kristina [name] MRN: [id number] DOB:Oct 16, 1945, 77 y.o., female Today's Date: 08/11/2023   PCP: Sigmund Hazel, MD REFERRING PROVIDER: Mealor, Roberts Gaudy, MD  PHYSICAL THERAPY DISCHARGE SUMMARY  Visits from Start of Care: 8  Current functional level related to goals / functional outcomes: Demo improved functional activity tolerance, gait speed, LE strength and reduced risk for falls per outcome measures   Remaining deficits: Decreased gait endurance per   Education / Equipment: HEP   Patient agrees to discharge. Patient goals were met. Patient is being discharged due to being pleased with the current functional level.  END OF SESSION:  PT End of Session - 08/11/23 0933     Visit Number 8    Number of Visits 8    Date for PT Re-Evaluation 08/18/23    Authorization Type [employer]    PT Start Time 0932    PT Stop Time 1015    PT Time Calculation (min) 43 min    Equipment Utilized During Treatment Gait belt    Activity Tolerance Patient tolerated treatment well    Behavior During Therapy WFL for tasks assessed/performed                 Past Medical History:  Diagnosis Date   Allergic rhinitis    Anemia glaucoma   Anxiety    Atrial fibrillation (HCC)    Back pain    Carotid arterial disease (HCC)    Cataract    Mixed OU   DDD (degenerative disc disease), lumbar    Family history of adverse reaction to anesthesia    daughter has PONV   Heart murmur    ECHO 10/23/22   High blood pressure    High cholesterol    History of stomach ulcers    Hypertensive retinopathy    OU   Rheumatic fever    Rheumatic fever    Rheumatoid arteritis (HCC)    Past Surgical History:  Procedure Laterality Date   APPENDECTOMY  1955   CARDIOVERSION N/A 02/25/2023   Procedure: CARDIOVERSION;  Surgeon: Jodelle Red, MD;  Location: [facility] INVASIVE CV LAB;  Service:  Cardiovascular;  Laterality: N/A;   CHOLECYSTECTOMY  2006   COLONOSCOPY  2023   DILATATION & CURETTAGE/HYSTEROSCOPY WITH MYOSURE N/A 04/15/2022   Procedure: DILATATION & CURETTAGE/HYSTEROSCOPY WITH MYOSURE;  Surgeon: Gerald Leitz, MD;  Location: [facility] OR;  Service: Gynecology;  Laterality: N/A;   ENDARTERECTOMY Left 09/27/2019   Procedure: ENDARTERECTOMY CAROTID LEFT;  Surgeon: Nada Libman, MD;  Location: [facility] OR;  Service: Vascular;  Laterality: Left;   HERNIA REPAIR  2007   incisional hernia after cholecystecomy   HERNIA REPAIR  2015   incisional hernia   PATCH ANGIOPLASTY Left 09/27/2019   Procedure: Patch Angioplasty Using Livia Snellen;  Surgeon: Nada Libman, MD;  Location: [facility] OR;  Service: Vascular;  Laterality: Left;   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   TONSILLECTOMY  1963   removed at 77 years old   Patient Active Problem List   Diagnosis Date Noted   Persistent atrial fibrillation (HCC) 03/09/2023   Hypercoagulable state due to persistent atrial fibrillation (HCC) 03/09/2023   Postmenopausal bleeding 04/15/2022   Endometrial polyp 04/15/2022   Prediabetes 10/27/2021   Anxiety 01/10/2021   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   Degenerative joint disease involving multiple joints 01/10/2021   Other allergy status, other than to drugs and biological substances 01/10/2021   Personal history of  transient ischemic attack (TIA), and cerebral infarction without residual deficits 01/10/2021   Pure hypercholesterolemia 01/10/2021   Unspecified urinary incontinence 01/10/2021   Visual loss 01/10/2021   Vitamin D deficiency, unspecified 01/10/2021   Aortic stenosis 12/22/2019   Left carotid artery stenosis 09/27/2019   Carotid artery disease (HCC) 09/22/2019   Class 3 severe obesity with serious comorbidity and body mass index (BMI) of 40.0 to 44.9 in adult [facility]) 12/15/2018   Chronic back pain 11/21/2018   Hyperlipidemia 02/24/2017   Essential hypertension 02/24/2017    ONSET  DATE: 06/28/23  REFERRING DIAG: I48.19 (ICD-10-CM) - Persistent atrial fibrillation (HCC)  THERAPY DIAG:  Muscle weakness (generalized)  Other abnormalities of gait and mobility  Unsteadiness on feet  Rationale for Evaluation and Treatment: Rehabilitation  SUBJECTIVE:                                                                                                                                                                                             SUBJECTIVE STATEMENT: "It goes"  Pt accompanied by: self  PERTINENT HISTORY: 77 y.o. female with a history of aortic stenosis, aortic atherosclerosis, carotid artery disease, HTN, HLD, OSA no longer on CPAP, and persistent atrial fibrillation, L TKR, R knee OA.Recent hospital admission for Tikosyn loading  PAIN:  Are you having pain? Yes: NPRS scale: 0/10 Pain location: low back. midline Pain description: ache, sore Aggravating factors: worse in AM Relieving factors: improves with activity  PRECAUTIONS: None  RED FLAGS: None   WEIGHT BEARING RESTRICTIONS: No  FALLS: Has patient fallen in last 6 months? Yes. Number of falls 1, tripped  LIVING ENVIRONMENT: Lives with: lives alone Lives in: House/apartment Stairs: Yes: Internal: flight, not required to go up steps; bilateral but cannot reach both and External: 1 steps; none Has following equipment at home: Walker - 4 wheeled  PLOF: Independent  PATIENT GOALS: improve endurance, return to [employer]  OBJECTIVE:    TODAY'S TREATMENT: 08/11/23 Activity Comments  Vitals: 166/89 mmHg, 67 bpm, 97% (RUE)   340 ft. Vitals post: 97%, 94 bpm, 211/104 mmHg (automatic cuff after several errors)--RUE LUE: 173/79 mmHg 11/20 Borg RPE  M-CTSIB Condition 1: normal Condition 2: mild Condition 3: normal Condition 4: mild  5xSTS 10 sec             HOME EXERCISE PROGRAM: Access Code: [email or web address] URL: [email or web address] Date:  07/16/2023 Prepared by: [facility] - Outpatient  Rehab - [facility]  Exercises - Seated Heel Toe Raises  - 1 x daily - 7 x weekly - 3 sets - 10 reps - Seated Long Arc Quad  -  1 x daily - 7 x weekly - 3 sets - 10 reps - Seated Hip Adduction Squeeze with Ball  - 1 x daily - 7 x weekly - 3 sets - 10 reps - 2 sec hold - Sit to Stand with Arms Crossed  - 1 x daily - 5 x weekly - 2 sets - 10 reps - Standing March with Counter Support  - 1 x daily - 5 x weekly - 2 sets - 20 reps - Side Stepping with Counter Support  - 1 x daily - 5 x weekly - 2 sets - 10 reps - Mini Squat with Counter Support  - 1 x daily - 7 x weekly - 5 sets - 5 reps - Standing Hip Abduction with Ankle Weight  - 1 x daily - 5 x weekly - 2 sets - 10 reps     ............................  Note: Objective measures were completed at Evaluation unless otherwise noted.  VITALS: 95%, 74 bpm, 114/70 mmHg  DIAGNOSTIC FINDINGS: EKG during hospitalization  COGNITION: Overall cognitive status: Within functional limits for tasks assessed   SENSATION: WFL  COORDINATION: NL  EDEMA:  none  MUSCLE TONE: WNL    DTRs:  NT  POSTURE: No Significant postural limitations  LOWER EXTREMITY ROM:     WNL  LOWER EXTREMITY MMT:    Grossly 4/5  BED MOBILITY:  Indep  TRANSFERS: Assistive device utilized: None  Sit to stand: Complete Independence Stand to sit: Complete Independence Chair to chair: Complete Independence Floor:  NT   CURB:  Level of Assistance: Complete Independence Assistive device utilized: None Curb Comments: requires to stop and position  STAIRS: Level of Assistance: Modified independence Stair Negotiation Technique: Step to Pattern with Bilateral Rails   GAIT: Gait pattern:  lateral trunk displacement in stance due to LBP Distance walked: 300 ft Assistive device utilized: None Level of assistance: Complete Independence Comments:   FUNCTIONAL TESTS:  5 times sit to stand: 25 sec 2  minute walk test: 300 ft Vitals pre-test: see above Vitals post-test: 98%, 79 bpm, 161/85 mmHg Rates 13/20 Borg RPE  M-CTSIB  Condition 1: Firm Surface, EO 30 Sec, Mild Sway  Condition 2: Firm Surface, EC 30 Sec, Mild and Moderate Sway  Condition 3: Foam Surface, EO 30 Sec, Mild Sway  Condition 4: Foam Surface, EC 20 Sec, Mild and Moderate Sway         GOALS: Goals reviewed with patient? Yes  SHORT TERM GOALS: Target date: 07/28/2023    Patient will be independent in HEP to improve functional outcomes Baseline: Goal status: MET  2.  Demo improved LE strength and reduced risk for falls per 15 sec 5xSTS Test Baseline: 25 sec; (08/11/23) 10.91 sec Goal status:MET    LONG TERM GOALS: Target date: 08/18/2023    Reports ability to resume Silver Sneakers program at local facility Baseline:  Goal status: MET  2.  Demo improved cardiovascular endurance and gait speed per distance of 450 ft during 2 MWT Baseline: 300 ft, 13/20 Borg RPE; 340 ft, 11/20 RPE Goal status: NOT MET  3.  Demo improved postural stability per mild sway x 30 sec condition 4 M-CTSIB Baseline: 20 sec mild-mod Goal status: MET    ASSESSMENT:  CLINICAL IMPRESSION: Demo improved functional status and BLE strength as evidenced by 5xSTS from initial 25 sec to 10 sec and increased gait distance under .  Demo excellent HEP recall and performance without issue.  Pt has umpcoming surgical procedure and intends to return  to SilverSneakers class thereafter.  No further issues. D/C to HEP  OBJECTIVE IMPAIRMENTS: Abnormal gait, cardiopulmonary status limiting activity, decreased activity tolerance, decreased balance, decreased endurance, difficulty walking, and decreased strength.   ACTIVITY LIMITATIONS: carrying, lifting, standing, stairs, and locomotion level  PARTICIPATION LIMITATIONS: meal prep, cleaning, laundry, shopping, community activity, and yard work  PERSONAL FACTORS: Age, Past/current  experiences, Time since onset of injury/illness/exacerbation, and 1-2 comorbidities: PMH  are also affecting patient's functional outcome.   REHAB POTENTIAL: Excellent  CLINICAL DECISION MAKING: Stable/uncomplicated  EVALUATION COMPLEXITY: Low  PLAN:  PT FREQUENCY: 1-2x/week  PT DURATION: 6 weeks  PLANNED INTERVENTIONS: Therapeutic exercises, Therapeutic activity, Neuromuscular re-education, Balance training, Gait training, Patient/Family education, Self Care, Joint mobilization, Stair training, Vestibular training, Canalith repositioning, DME instructions, Aquatic Therapy, Dry Needling, Electrical stimulation, Spinal mobilization, Moist heat, and Manual therapy  PLAN FOR NEXT SESSION: D/C    9:33 AM, 08/11/23 M. Shary Decamp, PT, DPT Physical Therapist- [place] Office Number: [phone]

## 2023-08-11 NOTE — Pre-Procedure Instructions (Signed)
Surgical Instructions   Your procedure is scheduled on August 18, 2023. Report to [facility] Main Entrance "A" at 12:00 P.M., then check in with the Admitting office. Any questions or running late day of surgery: call [phone]  Questions prior to your surgery date: call [phone], Monday-Friday, 8am-4pm. If you experience any cold or flu symptoms such as cough, fever, chills, shortness of breath, etc. between now and your scheduled surgery, please notify us at the above number.     Remember:  Do not eat or drink after midnight the night before your surgery    Take these medicines the morning of surgery with A SIP OF WATER: atorvastatin (LIPITOR)  dofetilide (TIKOSYN)  dorzolamide-timolol (COSOPT) eye drops   STOP taking your apixaban (ELIQUIS) two days prior to surgery. Your last dose will be November 10th.   One week prior to surgery, STOP taking any Aspirin (unless otherwise instructed by your surgeon) Aleve, Naproxen, Ibuprofen, Motrin, Advil, Goody's, BC's, all herbal medications, fish oil, and non-prescription vitamins.                     Do NOT Smoke (Tobacco/Vaping) for 24 hours prior to your procedure.  If you use a CPAP at night, you may bring your mask/headgear for your overnight stay.   You will be asked to remove any contacts, glasses, piercing's, hearing aid's, dentures/partials prior to surgery. Please bring cases for these items if needed.    Patients discharged the day of surgery will not be allowed to drive home, and someone needs to stay with them for 24 hours.  SURGICAL WAITING ROOM VISITATION Patients may have no more than 2 support people in the waiting area - these visitors may rotate.   Pre-op nurse will coordinate an appropriate time for 1 ADULT support person, who may not rotate, to accompany patient in pre-op.  Children under the age of 70 must have an adult with them who is not the patient and must remain in the main waiting area with an  adult.  If the patient needs to stay at the hospital during part of their recovery, the visitor guidelines for inpatient rooms apply.  Please refer to the [facility] website for the visitor guidelines for any additional information.   If you received a COVID test during your pre-op visit  it is requested that you wear a mask when out in public, stay away from anyone that may not be feeling well and notify your surgeon if you develop symptoms. If you have been in contact with anyone that has tested positive in the last 10 days please notify you surgeon.      Pre-operative CHG Bathing Instructions   You can play a key role in reducing the risk of infection after surgery. Your skin needs to be as free of germs as possible. You can reduce the number of germs on your skin by washing with CHG (chlorhexidine gluconate) soap before surgery. CHG is an antiseptic soap that kills germs and continues to kill germs even after washing.   DO NOT use if you have an allergy to chlorhexidine/CHG or antibacterial soaps. If your skin becomes reddened or irritated, stop using the CHG and notify one of our RNs at [phone].              TAKE A SHOWER THE NIGHT BEFORE SURGERY AND THE DAY OF SURGERY    Please keep in mind the following:  DO NOT shave, including legs and underarms, 48  hours prior to surgery.   You may shave your face before/day of surgery.  Place clean sheets on your bed the night before surgery Use a clean washcloth (not used since being washed) for each shower. DO NOT sleep with pet's night before surgery.  CHG Shower Instructions:  Wash your face and private area with normal soap. If you choose to wash your hair, wash first with your normal shampoo.  After you use shampoo/soap, rinse your hair and body thoroughly to remove shampoo/soap residue.  Turn the water OFF and apply half the bottle of CHG soap to a CLEAN washcloth.  Apply CHG soap ONLY FROM YOUR NECK DOWN TO YOUR TOES (washing  for 3-5 minutes)  DO NOT use CHG soap on face, private areas, open wounds, or sores.  Pay special attention to the area where your surgery is being performed.  If you are having back surgery, having someone wash your back for you may be helpful. Wait 2 minutes after CHG soap is applied, then you may rinse off the CHG soap.  Pat dry with a clean towel  Put on clean pajamas    Additional instructions for the day of surgery: DO NOT APPLY any lotions, deodorants, cologne, or perfumes.   Do not wear jewelry or makeup Do not wear nail polish, gel polish, artificial nails, or any other type of covering on natural nails (fingers and toes) Do not bring valuables to the hospital. [facility] is not responsible for valuables/personal belongings. Put on clean/comfortable clothes.  Please brush your teeth.  Ask your nurse before applying any prescription medications to the skin.

## 2023-08-12 ENCOUNTER — Encounter (HOSPITAL_COMMUNITY): Payer: Self-pay

## 2023-08-12 ENCOUNTER — Other Ambulatory Visit: Payer: Self-pay

## 2023-08-12 ENCOUNTER — Encounter (HOSPITAL_COMMUNITY)
Admission: RE | Admit: 2023-08-12 | Discharge: 2023-08-12 | Disposition: A | Discharge status: Home / Self Care | Payer: Medicare Other | Source: Ambulatory Visit | Attending: Surgery | Admitting: Surgery

## 2023-08-12 VITALS — BP 111/58 | HR 66 | Temp 98.3°F | Resp 17 | Ht 62.0 in | Wt 232.3 lb

## 2023-08-12 DIAGNOSIS — D649 Anemia, unspecified: Secondary | ICD-10-CM | POA: Diagnosis not present

## 2023-08-12 DIAGNOSIS — I10 Essential (primary) hypertension: Secondary | ICD-10-CM | POA: Insufficient documentation

## 2023-08-12 DIAGNOSIS — Z87891 Personal history of nicotine dependence: Secondary | ICD-10-CM | POA: Insufficient documentation

## 2023-08-12 DIAGNOSIS — Z01818 Encounter for other preprocedural examination: Secondary | ICD-10-CM

## 2023-08-12 DIAGNOSIS — Z6841 Body Mass Index (BMI) 40.0 and over, adult: Secondary | ICD-10-CM | POA: Insufficient documentation

## 2023-08-12 DIAGNOSIS — I4891 Unspecified atrial fibrillation: Secondary | ICD-10-CM | POA: Insufficient documentation

## 2023-08-12 DIAGNOSIS — Z01812 Encounter for preprocedural laboratory examination: Secondary | ICD-10-CM | POA: Diagnosis not present

## 2023-08-12 DIAGNOSIS — M069 Rheumatoid arthritis, unspecified: Secondary | ICD-10-CM | POA: Insufficient documentation

## 2023-08-12 DIAGNOSIS — I080 Rheumatic disorders of both mitral and aortic valves: Secondary | ICD-10-CM | POA: Insufficient documentation

## 2023-08-12 DIAGNOSIS — H409 Unspecified glaucoma: Secondary | ICD-10-CM | POA: Insufficient documentation

## 2023-08-12 DIAGNOSIS — I6523 Occlusion and stenosis of bilateral carotid arteries: Secondary | ICD-10-CM | POA: Diagnosis not present

## 2023-08-12 DIAGNOSIS — E7800 Pure hypercholesterolemia, unspecified: Secondary | ICD-10-CM | POA: Diagnosis not present

## 2023-08-12 DIAGNOSIS — Z7901 Long term (current) use of anticoagulants: Secondary | ICD-10-CM | POA: Insufficient documentation

## 2023-08-12 DIAGNOSIS — G4733 Obstructive sleep apnea (adult) (pediatric): Secondary | ICD-10-CM | POA: Insufficient documentation

## 2023-08-12 HISTORY — DX: Peripheral vascular disease, unspecified: I73.9

## 2023-08-12 HISTORY — DX: Cardiac arrhythmia, unspecified: I49.9

## 2023-08-12 LAB — COMPREHENSIVE METABOLIC PANEL
ALT: 29 U/L (ref 0–44)
AST: 28 U/L (ref 15–41)
Albumin: 3.7 g/dL (ref 3.5–5.0)
Alkaline Phosphatase: 71 U/L (ref 38–126)
Anion gap: 12 (ref 5–15)
BUN: 16 mg/dL (ref 8–23)
CO2: 28 mmol/L (ref 22–32)
Calcium: 9.7 mg/dL (ref 8.9–10.3)
Chloride: 100 mmol/L (ref 98–111)
Creatinine, Ser: 0.64 mg/dL (ref 0.44–1.00)
GFR, Estimated: >60 mL/min (ref >60)
Glucose, Bld: 105 mg/dL — ABNORMAL HIGH (ref 70–99)
Potassium: 4.2 mmol/L (ref 3.5–5.1)
Sodium: 140 mmol/L (ref 135–145)
Total Bilirubin: 0.7 mg/dL (ref <1.2)
Total Protein: 7.1 g/dL (ref 6.5–8.1)

## 2023-08-12 LAB — URINALYSIS, ROUTINE W REFLEX MICROSCOPIC
Bacteria, UA: NONE SEEN
Bilirubin Urine: NEGATIVE
Glucose, UA: NEGATIVE mg/dL
Ketones, ur: NEGATIVE mg/dL
Leukocytes,Ua: NEGATIVE
Nitrite: NEGATIVE
Protein, ur: NEGATIVE mg/dL
RBC / HPF: >50 RBC/hpf (ref 0–5)
Specific Gravity, Urine: 1.015 (ref 1.005–1.030)
pH: 6 (ref 5.0–8.0)

## 2023-08-12 LAB — SURGICAL PCR SCREEN
MRSA, PCR: NEGATIVE
Staphylococcus aureus: NEGATIVE

## 2023-08-12 LAB — TYPE AND SCREEN
ABO/RH(D): O POS
Antibody Screen: NEGATIVE

## 2023-08-12 LAB — CBC
HCT: 38.8 % (ref 36.0–46.0)
Hemoglobin: 11.8 g/dL — ABNORMAL LOW (ref 12.0–15.0)
MCH: 20.3 pg — ABNORMAL LOW (ref 26.0–34.0)
MCHC: 30.4 g/dL (ref 30.0–36.0)
MCV: 66.7 fL — ABNORMAL LOW (ref 80.0–100.0)
Platelets: 295 10*3/uL (ref 150–400)
RBC: 5.82 MIL/uL — ABNORMAL HIGH (ref 3.87–5.11)
RDW: 16.4 % — ABNORMAL HIGH (ref 11.5–15.5)
WBC: 6.3 10*3/uL (ref 4.0–10.5)
nRBC: 0 % (ref 0.0–0.2)

## 2023-08-12 LAB — PROTIME-INR
INR: 1.2 (ref 0.8–1.2)
Prothrombin Time: 15.5 s — ABNORMAL HIGH (ref 11.4–15.2)

## 2023-08-12 LAB — APTT: aPTT: 33 s (ref 24–36)

## 2023-08-12 NOTE — Progress Notes (Addendum)
PCP - Dr. Sigmund Hazel Cardiologist - Dr. Nanetta Batty - Last office visit 07/08/2023 with [facility]. Lats office visit 06/21/2023 with Cardiology  PPM/ICD - Denies Device Orders - n/a Rep Notified - n/a  Chest x-ray - 02/28/2023 EKG - 07/08/2023 Stress Test - Per pt, many years ago ECHO - 10/23/2022 Cardiac Cath - Denies  Sleep Study - Newly diagnosed with OSA. Pt is picking up her CPAP on 11/18.  No DM  Last dose of GLP1 agonist- n/a GLP1 instructions: n/a  Blood Thinner Instructions: Pt instructed to hold Eliquis for 2 days. Last dose will be 11/10 Aspirin Instructions: n/a  NPO after midnight  COVID TEST- n/a   Anesthesia review: Yes. Cardiac hx (A.Fib, known heart murmur and HTN). Pt had a failed cardioversion in May 2024. In September (23rd-27th), pt underwent Tikosyn load with monitoring. She was discharged and had follow-up visit with [facility]. Pt is still in NSR.  Patient denies shortness of breath, fever, cough and chest pain at PAT appointment. Pt denies any respiratory illness/infection in the last two months.    All instructions explained to the patient, with a verbal understanding of the material. Patient agrees to go over the instructions while at home for a better understanding. Patient also instructed to self quarantine after being tested for COVID-19. The opportunity to ask questions was provided.
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
## 2023-08-13 NOTE — Anesthesia Preprocedure Evaluation (Signed)
Anesthesia Evaluation  Patient identified by MRN, date of birth, ID band Patient awake    Reviewed: Allergy & Precautions, NPO status , Patient's Chart, lab work & pertinent test results, reviewed documented beta blocker date and time   History of Anesthesia Complications (+) PONV and history of anesthetic complications  Airway Mallampati: III  TM Distance: >3 FB     Dental  (+) Edentulous Upper   Pulmonary neg sleep apnea, pneumonia, neg COPD, former smoker, neg PE   breath sounds clear to auscultation       Cardiovascular hypertension, (-) angina (-) CAD, (-) Past MI and (-) Cardiac Stents + Valvular Problems/Murmurs  Rhythm:Regular Rate:Normal     Neuro/Psych neg Seizures  Neuromuscular disease    GI/Hepatic ,GERD  ,,  Endo/Other    Renal/GU Renal disease     Musculoskeletal  (+) Arthritis ,    Abdominal   Peds  Hematology   Anesthesia Other Findings   Reproductive/Obstetrics                             Anesthesia Physical Anesthesia Plan  ASA: 2  Anesthesia Plan: General   Post-op Pain Management: Regional block*   Induction: Intravenous  PONV Risk Score and Plan: 4 or greater and TIVA, Dexamethasone and Ondansetron  Airway Management Planned: Oral ETT  Additional Equipment:   Intra-op Plan:   Post-operative Plan: Extubation in OR  Informed Consent: I have reviewed the patients History and Physical, chart, labs and discussed the procedure including the risks, benefits and alternatives for the proposed anesthesia with the patient or authorized representative who has indicated his/her understanding and acceptance.     Dental advisory given  Plan Discussed with:   Anesthesia Plan Comments: (PAT note written 08/13/2023 by Shonna Chock, PA-C.  )       Anesthesia Quick Evaluation

## 2023-08-18 ENCOUNTER — Other Ambulatory Visit: Payer: Self-pay

## 2023-08-18 ENCOUNTER — Inpatient Hospital Stay (HOSPITAL_COMMUNITY)
Admission: RE | Admit: 2023-08-18 | Discharge: 2023-08-19 | DRG: 038 | Disposition: A | Discharge status: Home / Self Care | Payer: Medicare Other | Attending: Surgery | Admitting: Surgery

## 2023-08-18 ENCOUNTER — Inpatient Hospital Stay (HOSPITAL_COMMUNITY): Payer: Medicare Other | Admitting: Anesthesiology

## 2023-08-18 ENCOUNTER — Inpatient Hospital Stay (HOSPITAL_COMMUNITY): Payer: Medicare Other | Admitting: Vascular Surgery

## 2023-08-18 ENCOUNTER — Encounter (HOSPITAL_COMMUNITY)
Admission: RE | Disposition: A | Discharge status: Home / Self Care | Payer: Self-pay | Source: Home / Self Care | Attending: Surgery

## 2023-08-18 ENCOUNTER — Encounter (HOSPITAL_COMMUNITY): Payer: Self-pay | Admitting: Surgery

## 2023-08-18 DIAGNOSIS — Z9889 Other specified postprocedural states: Principal | ICD-10-CM

## 2023-08-18 DIAGNOSIS — I4891 Unspecified atrial fibrillation: Secondary | ICD-10-CM | POA: Diagnosis present

## 2023-08-18 DIAGNOSIS — F419 Anxiety disorder, unspecified: Secondary | ICD-10-CM | POA: Diagnosis present

## 2023-08-18 DIAGNOSIS — I6521 Occlusion and stenosis of right carotid artery: Secondary | ICD-10-CM | POA: Diagnosis not present

## 2023-08-18 DIAGNOSIS — Z7901 Long term (current) use of anticoagulants: Secondary | ICD-10-CM

## 2023-08-18 DIAGNOSIS — Z79899 Other long term (current) drug therapy: Secondary | ICD-10-CM

## 2023-08-18 DIAGNOSIS — H35039 Hypertensive retinopathy, unspecified eye: Secondary | ICD-10-CM | POA: Diagnosis not present

## 2023-08-18 DIAGNOSIS — E7800 Pure hypercholesterolemia, unspecified: Secondary | ICD-10-CM | POA: Diagnosis not present

## 2023-08-18 DIAGNOSIS — I739 Peripheral vascular disease, unspecified: Secondary | ICD-10-CM | POA: Diagnosis not present

## 2023-08-18 DIAGNOSIS — I6523 Occlusion and stenosis of bilateral carotid arteries: Secondary | ICD-10-CM | POA: Diagnosis not present

## 2023-08-18 DIAGNOSIS — Z83511 Family history of glaucoma: Secondary | ICD-10-CM | POA: Diagnosis not present

## 2023-08-18 DIAGNOSIS — Z825 Family history of asthma and other chronic lower respiratory diseases: Secondary | ICD-10-CM

## 2023-08-18 DIAGNOSIS — I6529 Occlusion and stenosis of unspecified carotid artery: Secondary | ICD-10-CM | POA: Diagnosis present

## 2023-08-18 DIAGNOSIS — I10 Essential (primary) hypertension: Secondary | ICD-10-CM | POA: Diagnosis present

## 2023-08-18 DIAGNOSIS — E66813 Obesity, class 3: Secondary | ICD-10-CM | POA: Diagnosis present

## 2023-08-18 DIAGNOSIS — Z6841 Body Mass Index (BMI) 40.0 and over, adult: Secondary | ICD-10-CM | POA: Diagnosis not present

## 2023-08-18 DIAGNOSIS — Z888 Allergy status to other drugs, medicaments and biological substances status: Secondary | ICD-10-CM | POA: Diagnosis not present

## 2023-08-18 DIAGNOSIS — Z880 Allergy status to penicillin: Secondary | ICD-10-CM

## 2023-08-18 DIAGNOSIS — Z8711 Personal history of peptic ulcer disease: Secondary | ICD-10-CM

## 2023-08-18 DIAGNOSIS — Z87891 Personal history of nicotine dependence: Secondary | ICD-10-CM | POA: Diagnosis not present

## 2023-08-18 DIAGNOSIS — Z8249 Family history of ischemic heart disease and other diseases of the circulatory system: Secondary | ICD-10-CM

## 2023-08-18 DIAGNOSIS — Z841 Family history of disorders of kidney and ureter: Secondary | ICD-10-CM | POA: Diagnosis not present

## 2023-08-18 HISTORY — PX: ENDARTERECTOMY: SHX5162

## 2023-08-18 LAB — CBC
HCT: 34.3 % — ABNORMAL LOW (ref 36.0–46.0)
Hemoglobin: 10.5 g/dL — ABNORMAL LOW (ref 12.0–15.0)
MCH: 20 pg — ABNORMAL LOW (ref 26.0–34.0)
MCHC: 30.6 g/dL (ref 30.0–36.0)
MCV: 65.3 fL — ABNORMAL LOW (ref 80.0–100.0)
Platelets: 286 10*3/uL (ref 150–400)
RBC: 5.25 MIL/uL — ABNORMAL HIGH (ref 3.87–5.11)
RDW: 15.9 % — ABNORMAL HIGH (ref 11.5–15.5)
WBC: 7.3 10*3/uL (ref 4.0–10.5)
nRBC: 0 % (ref 0.0–0.2)

## 2023-08-18 LAB — GLUCOSE, CAPILLARY: Glucose-Capillary: 124 mg/dL — ABNORMAL HIGH (ref 70–99)

## 2023-08-18 LAB — CREATININE, SERUM
Creatinine, Ser: 0.55 mg/dL (ref 0.44–1.00)
GFR, Estimated: >60 mL/min (ref >60)

## 2023-08-18 SURGERY — ENDARTERECTOMY, CAROTID
Anesthesia: General | Laterality: Right

## 2023-08-18 MED ORDER — SODIUM CHLORIDE 0.9 % IV SOLN
0.0125 ug/kg/min | INTRAVENOUS | Status: DC
  Administered 2023-08-18: .05 ug/kg/min via INTRAVENOUS
  Filled 2023-08-18: qty 1000

## 2023-08-18 MED ORDER — HEPARIN 6000 UNIT IRRIGATION SOLUTION
Status: DC | PRN
  Administered 2023-08-18: 1

## 2023-08-18 MED ORDER — SODIUM CHLORIDE 0.9 % IV SOLN: INTRAVENOUS | Status: DC

## 2023-08-18 MED ORDER — ONDANSETRON HCL 4 MG/2ML IJ SOLN: 4.0000 mg | Freq: Once | INTRAMUSCULAR | Status: DC | PRN

## 2023-08-18 MED ORDER — DOFETILIDE 500 MCG PO CAPS
500.0000 ug | ORAL_CAPSULE | Freq: Two times a day (BID) | ORAL | Status: DC
  Administered 2023-08-18 – 2023-08-19 (×2): 500 ug via ORAL
  Filled 2023-08-18 (×2): qty 1

## 2023-08-18 MED ORDER — HEPARIN 6000 UNIT IRRIGATION SOLUTION
Status: AC
Start: 2023-08-18 — End: ?
  Filled 2023-08-18: qty 500

## 2023-08-18 MED ORDER — DEXAMETHASONE SODIUM PHOSPHATE 10 MG/ML IJ SOLN
INTRAMUSCULAR | Status: DC | PRN
  Administered 2023-08-18: 5 mg via INTRAVENOUS

## 2023-08-18 MED ORDER — PROPOFOL 10 MG/ML IV BOLUS
INTRAVENOUS | Status: DC | PRN
  Administered 2023-08-18: 40 mg via INTRAVENOUS
  Administered 2023-08-18: 130 mg via INTRAVENOUS

## 2023-08-18 MED ORDER — PROTAMINE SULFATE 10 MG/ML IV SOLN
INTRAVENOUS | Status: AC
Start: 2023-08-18 — End: ?
  Filled 2023-08-18: qty 5

## 2023-08-18 MED ORDER — PHENYLEPHRINE HCL-NACL 20-0.9 MG/250ML-% IV SOLN
INTRAVENOUS | Status: DC | PRN
  Administered 2023-08-18: 20 ug/min via INTRAVENOUS

## 2023-08-18 MED ORDER — VANCOMYCIN HCL 1500 MG/300ML IV SOLN
INTRAVENOUS | Status: AC
  Administered 2023-08-18: 1500 mg via INTRAVENOUS
  Filled 2023-08-18: qty 300

## 2023-08-18 MED ORDER — ACETAMINOPHEN 325 MG PO TABS: 325.0000 mg | ORAL_TABLET | ORAL | Status: DC | PRN

## 2023-08-18 MED ORDER — EPHEDRINE SULFATE-NACL 50-0.9 MG/10ML-% IV SOSY
PREFILLED_SYRINGE | INTRAVENOUS | Status: DC | PRN
  Administered 2023-08-18: 10 mg via INTRAVENOUS
  Administered 2023-08-18: 5 mg via INTRAVENOUS

## 2023-08-18 MED ORDER — CALCIUM CARBONATE ANTACID 500 MG PO CHEW
800.0000 mg | CHEWABLE_TABLET | Freq: Every day | ORAL | Status: DC
  Administered 2023-08-19: 800 mg via ORAL
  Filled 2023-08-18: qty 4

## 2023-08-18 MED ORDER — FENTANYL CITRATE (PF) 100 MCG/2ML IJ SOLN
25.0000 ug | INTRAMUSCULAR | Status: DC | PRN
  Administered 2023-08-18 (×2): 25 ug via INTRAVENOUS

## 2023-08-18 MED ORDER — CHLORHEXIDINE GLUCONATE CLOTH 2 % EX PADS
6.0000 | MEDICATED_PAD | Freq: Once | CUTANEOUS | Status: DC
Start: 2023-08-18 — End: 2023-08-18
  Administered 2023-08-18: 6 via TOPICAL

## 2023-08-18 MED ORDER — LIDOCAINE 2% (20 MG/ML) 5 ML SYRINGE
INTRAMUSCULAR | Status: AC
  Filled 2023-08-18: qty 5

## 2023-08-18 MED ORDER — FENTANYL CITRATE (PF) 250 MCG/5ML IJ SOLN
INTRAMUSCULAR | Status: DC | PRN
  Administered 2023-08-18: 100 ug via INTRAVENOUS
  Administered 2023-08-18: 25 ug via INTRAVENOUS
  Administered 2023-08-18: 50 ug via INTRAVENOUS

## 2023-08-18 MED ORDER — PROPOFOL 10 MG/ML IV BOLUS
INTRAVENOUS | Status: AC
  Filled 2023-08-18: qty 20

## 2023-08-18 MED ORDER — FERROUS SULFATE 325 (65 FE) MG PO TABS
325.0000 mg | ORAL_TABLET | Freq: Every day | ORAL | Status: DC
  Administered 2023-08-19: 325 mg via ORAL
  Filled 2023-08-18: qty 1

## 2023-08-18 MED ORDER — POTASSIUM CHLORIDE CRYS ER 20 MEQ PO TBCR: 20.0000 meq | EXTENDED_RELEASE_TABLET | Freq: Every day | ORAL | Status: DC | PRN

## 2023-08-18 MED ORDER — SENNOSIDES-DOCUSATE SODIUM 8.6-50 MG PO TABS: 1.0000 | ORAL_TABLET | Freq: Every evening | ORAL | Status: DC | PRN

## 2023-08-18 MED ORDER — LIDOCAINE HCL (PF) 1 % IJ SOLN
INTRAMUSCULAR | Status: AC
Start: 2023-08-18 — End: ?
  Filled 2023-08-18: qty 5

## 2023-08-18 MED ORDER — CALCIUM CARBONATE ANTACID 1000 MG PO CHEW: 1000.0000 mg | CHEWABLE_TABLET | Freq: Every morning | ORAL | Status: DC

## 2023-08-18 MED ORDER — OXYCODONE HCL 5 MG PO TABS
5.0000 mg | ORAL_TABLET | ORAL | Status: DC | PRN
  Administered 2023-08-18 – 2023-08-19 (×3): 10 mg via ORAL
  Filled 2023-08-18 (×3): qty 2

## 2023-08-18 MED ORDER — SODIUM CHLORIDE 0.9 % IV SOLN: 500.0000 mL | Freq: Once | INTRAVENOUS | Status: DC | PRN

## 2023-08-18 MED ORDER — HEMOSTATIC AGENTS (NO CHARGE) OPTIME
TOPICAL | Status: DC | PRN
  Administered 2023-08-18: 1 via TOPICAL

## 2023-08-18 MED ORDER — ACETAMINOPHEN 500 MG PO TABS
ORAL_TABLET | ORAL | Status: AC
  Administered 2023-08-18: 1000 mg via ORAL
  Filled 2023-08-18: qty 2

## 2023-08-18 MED ORDER — DOCUSATE SODIUM 100 MG PO CAPS
100.0000 mg | ORAL_CAPSULE | Freq: Every day | ORAL | Status: DC
  Administered 2023-08-19: 100 mg via ORAL
  Filled 2023-08-18: qty 1

## 2023-08-18 MED ORDER — PHENYLEPHRINE 80 MCG/ML (10ML) SYRINGE FOR IV PUSH (FOR BLOOD PRESSURE SUPPORT)
PREFILLED_SYRINGE | INTRAVENOUS | Status: DC | PRN
  Administered 2023-08-18 (×2): 80 ug via INTRAVENOUS

## 2023-08-18 MED ORDER — FENTANYL CITRATE (PF) 250 MCG/5ML IJ SOLN
INTRAMUSCULAR | Status: AC
  Filled 2023-08-18: qty 5

## 2023-08-18 MED ORDER — 0.9 % SODIUM CHLORIDE (POUR BTL) OPTIME
TOPICAL | Status: DC | PRN
  Administered 2023-08-18: 2000 mL

## 2023-08-18 MED ORDER — GLYCOPYRROLATE 0.2 MG/ML IJ SOLN
INTRAMUSCULAR | Status: DC | PRN
  Administered 2023-08-18 (×2): .1 mg via INTRAVENOUS

## 2023-08-18 MED ORDER — PROTAMINE SULFATE 10 MG/ML IV SOLN
INTRAVENOUS | Status: DC | PRN
  Administered 2023-08-18: 50 mg via INTRAVENOUS

## 2023-08-18 MED ORDER — PANTOPRAZOLE SODIUM 40 MG PO TBEC
40.0000 mg | DELAYED_RELEASE_TABLET | Freq: Every day | ORAL | Status: DC
  Administered 2023-08-18 – 2023-08-19 (×2): 40 mg via ORAL
  Filled 2023-08-18 (×2): qty 1

## 2023-08-18 MED ORDER — DEXAMETHASONE SODIUM PHOSPHATE 10 MG/ML IJ SOLN
INTRAMUSCULAR | Status: AC
  Filled 2023-08-18: qty 1

## 2023-08-18 MED ORDER — HYDROMORPHONE HCL 1 MG/ML IJ SOLN: 0.5000 mg | INTRAMUSCULAR | Status: DC | PRN

## 2023-08-18 MED ORDER — MAGNESIUM SULFATE 2 GM/50ML IV SOLN: 2.0000 g | Freq: Every day | INTRAVENOUS | Status: DC | PRN

## 2023-08-18 MED ORDER — HEPARIN SODIUM (PORCINE) 5000 UNIT/ML IJ SOLN
5000.0000 [IU] | Freq: Three times a day (TID) | INTRAMUSCULAR | Status: DC
Start: 2023-08-19 — End: 2023-08-19
  Administered 2023-08-19: 5000 [IU] via SUBCUTANEOUS

## 2023-08-18 MED ORDER — BISACODYL 5 MG PO TBEC: 5.0000 mg | DELAYED_RELEASE_TABLET | Freq: Every day | ORAL | Status: DC | PRN

## 2023-08-18 MED ORDER — ONDANSETRON HCL 4 MG/2ML IJ SOLN
INTRAMUSCULAR | Status: DC | PRN
  Administered 2023-08-18: 4 mg via INTRAVENOUS

## 2023-08-18 MED ORDER — OXYCODONE HCL 5 MG/5ML PO SOLN: 5.0000 mg | Freq: Once | ORAL | Status: DC | PRN

## 2023-08-18 MED ORDER — OXYCODONE HCL 5 MG PO TABS
5.0000 mg | ORAL_TABLET | Freq: Once | ORAL | Status: DC | PRN
Start: 2023-08-18 — End: 2023-08-18

## 2023-08-18 MED ORDER — HEPARIN SODIUM (PORCINE) 1000 UNIT/ML IJ SOLN
INTRAMUSCULAR | Status: DC | PRN
  Administered 2023-08-18: 10000 [IU] via INTRAVENOUS

## 2023-08-18 MED ORDER — ACETAMINOPHEN 650 MG RE SUPP: 325.0000 mg | RECTAL | Status: DC | PRN

## 2023-08-18 MED ORDER — ROCURONIUM BROMIDE 10 MG/ML (PF) SYRINGE
PREFILLED_SYRINGE | INTRAVENOUS | Status: AC
  Filled 2023-08-18: qty 10

## 2023-08-18 MED ORDER — ONDANSETRON HCL 4 MG/2ML IJ SOLN
INTRAMUSCULAR | Status: AC
  Filled 2023-08-18: qty 2

## 2023-08-18 MED ORDER — LACTATED RINGERS IV SOLN: INTRAVENOUS | Status: DC | PRN

## 2023-08-18 MED ORDER — SUGAMMADEX SODIUM 200 MG/2ML IV SOLN
INTRAVENOUS | Status: DC | PRN
  Administered 2023-08-18: 200 mg via INTRAVENOUS

## 2023-08-18 MED ORDER — ATORVASTATIN CALCIUM 10 MG PO TABS
20.0000 mg | ORAL_TABLET | Freq: Every day | ORAL | Status: DC
  Administered 2023-08-19: 20 mg via ORAL
  Filled 2023-08-18: qty 2

## 2023-08-18 MED ORDER — ASPIRIN 81 MG PO TBEC
81.0000 mg | DELAYED_RELEASE_TABLET | Freq: Every day | ORAL | Status: DC
  Administered 2023-08-19: 81 mg via ORAL
  Filled 2023-08-18: qty 1

## 2023-08-18 MED ORDER — HYDROCHLOROTHIAZIDE 25 MG PO TABS: 25.0000 mg | ORAL_TABLET | Freq: Every morning | ORAL | Status: DC

## 2023-08-18 MED ORDER — ROCURONIUM BROMIDE 10 MG/ML (PF) SYRINGE
PREFILLED_SYRINGE | INTRAVENOUS | Status: DC | PRN
  Administered 2023-08-18: 50 mg via INTRAVENOUS
  Administered 2023-08-18: 30 mg via INTRAVENOUS

## 2023-08-18 MED ORDER — METOPROLOL TARTRATE 5 MG/5ML IV SOLN: 2.0000 mg | INTRAVENOUS | Status: DC | PRN

## 2023-08-18 MED ORDER — VANCOMYCIN HCL 1500 MG/300ML IV SOLN: 1500.0000 mg | INTRAVENOUS | Status: AC

## 2023-08-18 MED ORDER — GUAIFENESIN-DM 100-10 MG/5ML PO SYRP: 15.0000 mL | ORAL_SOLUTION | ORAL | Status: DC | PRN

## 2023-08-18 MED ORDER — LIDOCAINE 2% (20 MG/ML) 5 ML SYRINGE
INTRAMUSCULAR | Status: DC | PRN
  Administered 2023-08-18: 60 mg via INTRAVENOUS

## 2023-08-18 MED ORDER — CHLORHEXIDINE GLUCONATE 0.12 % MT SOLN
OROMUCOSAL | Status: AC
  Administered 2023-08-18: 15 mL
  Filled 2023-08-18: qty 15

## 2023-08-18 MED ORDER — DORZOLAMIDE HCL-TIMOLOL MAL 2-0.5 % OP SOLN
1.0000 [drp] | Freq: Two times a day (BID) | OPHTHALMIC | Status: DC
  Administered 2023-08-18 – 2023-08-19 (×2): 1 [drp] via OPHTHALMIC
  Filled 2023-08-18: qty 10

## 2023-08-18 MED ORDER — ALUM & MAG HYDROXIDE-SIMETH 200-200-20 MG/5ML PO SUSP: 15.0000 mL | ORAL | Status: DC | PRN

## 2023-08-18 MED ORDER — ACETAMINOPHEN 500 MG PO TABS: 1000.0000 mg | ORAL_TABLET | Freq: Once | ORAL | Status: AC

## 2023-08-18 MED ORDER — LOSARTAN POTASSIUM 50 MG PO TABS
100.0000 mg | ORAL_TABLET | Freq: Every day | ORAL | Status: DC
  Administered 2023-08-19: 100 mg via ORAL
  Filled 2023-08-18: qty 2

## 2023-08-18 MED ORDER — VANCOMYCIN HCL IN DEXTROSE 1-5 GM/200ML-% IV SOLN
1000.0000 mg | Freq: Two times a day (BID) | INTRAVENOUS | Status: DC
Start: 2023-08-19 — End: 2023-08-19
  Administered 2023-08-19: 1000 mg via INTRAVENOUS
  Filled 2023-08-18: qty 200

## 2023-08-18 MED ORDER — MIDAZOLAM HCL 2 MG/2ML IJ SOLN
INTRAMUSCULAR | Status: AC
  Filled 2023-08-18: qty 2

## 2023-08-18 MED ORDER — FENTANYL CITRATE (PF) 100 MCG/2ML IJ SOLN
INTRAMUSCULAR | Status: AC
  Filled 2023-08-18: qty 2

## 2023-08-18 MED ORDER — ONDANSETRON HCL 4 MG/2ML IJ SOLN: 4.0000 mg | Freq: Four times a day (QID) | INTRAMUSCULAR | Status: DC | PRN

## 2023-08-18 MED ORDER — HYDRALAZINE HCL 20 MG/ML IJ SOLN: 5.0000 mg | INTRAMUSCULAR | Status: DC | PRN

## 2023-08-18 MED ORDER — LABETALOL HCL 5 MG/ML IV SOLN: 10.0000 mg | INTRAVENOUS | Status: DC | PRN

## 2023-08-18 SURGICAL SUPPLY — 44 items
BAG COUNTER SPONGE SURGICOUNT (BAG) ×1 IMPLANT
CANISTER SUCT 3000ML PPV (MISCELLANEOUS) ×1 IMPLANT
CATH ROBINSON RED A/P 18FR (CATHETERS) ×1 IMPLANT
CATH SUCT 10FR WHISTLE TIP (CATHETERS) ×1 IMPLANT
CLIP TI MEDIUM 6 (CLIP) ×1 IMPLANT
CLIP TI WIDE RED SMALL 6 (CLIP) ×1 IMPLANT
COVER PROBE W GEL 5X96 (DRAPES) IMPLANT
DERMABOND ADVANCED .7 DNX12 (GAUZE/BANDAGES/DRESSINGS) ×1 IMPLANT
DRAIN CHANNEL 15F RND FF W/TCR (WOUND CARE) IMPLANT
ELECT REM PT RETURN 9FT ADLT (ELECTROSURGICAL) ×1
ELECTRODE REM PT RTRN 9FT ADLT (ELECTROSURGICAL) ×1 IMPLANT
EVACUATOR SILICONE 100CC (DRAIN) IMPLANT
GLOVE SURG SS PI 7.5 STRL IVOR (GLOVE) ×3 IMPLANT
GOWN STRL REUS W/ TWL LRG LVL3 (GOWN DISPOSABLE) ×2 IMPLANT
GOWN STRL REUS W/ TWL XL LVL3 (GOWN DISPOSABLE) ×1 IMPLANT
GOWN STRL REUS W/TWL LRG LVL3 (GOWN DISPOSABLE) ×2
GOWN STRL REUS W/TWL XL LVL3 (GOWN DISPOSABLE) ×1
HEMOSTAT SNOW SURGICEL 2X4 (HEMOSTASIS) IMPLANT
INSERT FOGARTY SM (MISCELLANEOUS) IMPLANT
KIT BASIN OR (CUSTOM PROCEDURE TRAY) ×1 IMPLANT
KIT SHUNT ARGYLE CAROTID ART 6 (VASCULAR PRODUCTS) IMPLANT
KIT TURNOVER KIT B (KITS) ×1 IMPLANT
NDL HYPO 25GX1X1/2 BEV (NEEDLE) IMPLANT
NEEDLE HYPO 25GX1X1/2 BEV (NEEDLE) IMPLANT
NS IRRIG 1000ML POUR BTL (IV SOLUTION) ×3 IMPLANT
PACK CAROTID (CUSTOM PROCEDURE TRAY) ×1 IMPLANT
PAD ARMBOARD 7.5X6 YLW CONV (MISCELLANEOUS) ×2 IMPLANT
PATCH VASC XENOSURE 1X6 (Vascular Products) IMPLANT
POSITIONER HEAD DONUT 9IN (MISCELLANEOUS) ×1 IMPLANT
SET WALTER ACTIVATION W/DRAPE (SET/KITS/TRAYS/PACK) IMPLANT
SHUNT CAROTID BYPASS 10 (VASCULAR PRODUCTS) IMPLANT
SHUNT CAROTID BYPASS 12FRX15.5 (VASCULAR PRODUCTS) IMPLANT
SURGIFLO W/THROMBIN 8M KIT (HEMOSTASIS) IMPLANT
SUT ETHILON 3 0 PS 1 (SUTURE) IMPLANT
SUT PROLENE 6 0 BV (SUTURE) ×2 IMPLANT
SUT PROLENE 7 0 BV1 MDA (SUTURE) IMPLANT
SUT SILK 3 0 (SUTURE)
SUT SILK 3-0 18XBRD TIE 12 (SUTURE) IMPLANT
SUT VIC AB 3-0 SH 27 (SUTURE) ×2
SUT VIC AB 3-0 SH 27X BRD (SUTURE) ×2 IMPLANT
SUT VIC AB 3-0 X1 27 (SUTURE) ×1 IMPLANT
SYR CONTROL 10ML LL (SYRINGE) IMPLANT
TOWEL GREEN STERILE (TOWEL DISPOSABLE) ×1 IMPLANT
WATER STERILE IRR 1000ML POUR (IV SOLUTION) ×1 IMPLANT

## 2023-08-18 NOTE — H&P (Signed)
[facility] [place]   Patient name: Kristina [name]       MRN: [id number]        DOB: 12-19-1945            Sex: female     REASON FOR VISIT:      Follow up   HISOTRY OF PRESENT ILLNESS:      Kristina [name] is a 77 y.o. female who is status post left carotid endarterectomy on 09/27/2019 for symptomatic stenosis (left amaurosis).  She had an ultrasound in 2022 that suggested greater than 80% right-sided stenosis.  She was sent for CT angiogram that indicated only about 35% stenosis.  She remains asymptomatic.  Duplex in 2024 showed grade stenosis and so she was sent for a CT angiogram for further evaluation.    She is medically managed for hypertension with an ARB.  She takes a statin for hypercholesterolemia.  She is on aspirin.  She is a former smoker.   PAST MEDICAL HISTORY:        Past Medical History:  Diagnosis Date   Allergic rhinitis     Anemia glaucoma   Anxiety     Back pain     Cataract      Mixed OU   DDD (degenerative disc disease), lumbar     High blood pressure     High cholesterol     History of stomach ulcers     Hypertensive retinopathy      OU   Rheumatic fever     Rheumatic fever     Rheumatoid arteritis (HCC)              FAMILY HISTORY:         Family History  Problem Relation Age of Onset   Kidney disease Mother     Hypertension Mother     Glaucoma Mother     Heart failure Father     Emphysema Father     Asthma Father     Hypertension Father     Heart disease Father     COPD Brother     Allergic rhinitis Neg Hx     Angioedema Neg Hx     Atopy Neg Hx     Eczema Neg Hx     Immunodeficiency Neg Hx     Urticaria Neg Hx            SOCIAL HISTORY:    Social History         Tobacco Use   Smoking status: Former      Current packs/day: 0.00      Average packs/day: 1.5 packs/day for 30.0 years (45.0 ttl pk-yrs)      Types: Cigarettes      Start date: 52      Quit date: 2009       Years since quitting: 15.6   Smokeless tobacco: Never  Substance Use Topics   Alcohol use: Yes      Comment: social        ALLERGIES:    Allergies       Allergies  Allergen Reactions   Penicillins Anaphylaxis and Itching      Did it involve swelling of the face/tongue/throat, SOB, or low BP? Yes Did it involve sudden or severe rash/hives, skin peeling, or any reaction on the inside of your mouth or nose? No Did you need to seek medical attention at a hospital or doctor's office? Yes When did it last happen?  Childhood allergy If all above answers are "NO", may proceed with cephalosporin use.     Mite (D. Farinae)        Other reaction(s): sinus swelling          CURRENT MEDICATIONS:          Current Outpatient Medications  Medication Sig Dispense Refill   apixaban (ELIQUIS) 5 MG TABS tablet Take 1 tablet (5 mg total) by mouth 2 (two) times daily. Pt. Was given samples from her PCP enough to last one month 180 tablet 3   atorvastatin (LIPITOR) 20 MG tablet Take 20 mg by mouth every morning.       cyanocobalamin (VITAMIN B12) 1000 MCG tablet Take 1,000 mcg by mouth daily.       dorzolamide-timolol (COSOPT) 22.3-6.8 MG/ML ophthalmic solution Place 1 drop into both eyes 2 (two) times daily. 10 mL 4   ELDERBERRY PO Take 2 tablets by mouth daily. Airborne Immune gummy       escitalopram (LEXAPRO) 10 MG tablet Take 10 mg by mouth daily.       ferrous sulfate 325 (65 FE) MG EC tablet Take 325 mg by mouth daily.       furosemide (LASIX) 40 MG tablet Take 1 tablet (40 mg total) by mouth daily. 90 tablet 3   GLUCOSAMINE-CHONDROITIN PO Take 1,500 mg by mouth daily.       hydrochlorothiazide (HYDRODIURIL) 25 MG tablet Take 25 mg by mouth daily.        ibuprofen (ADVIL) 600 MG tablet Take 1 tablet (600 mg total) by mouth every 6 (six) hours as needed. 30 tablet 0   losartan (COZAAR) 100 MG tablet Take 100 mg by mouth daily.       metoprolol succinate (TOPROL-XL) 100 MG 24 hr tablet  Take 100 mg by mouth daily. Take with or immediately following a meal.       Multiple Vitamin (MULTIVITAMIN) capsule Take 1 capsule by mouth daily. Vita fusion       Multiple Vitamins-Minerals (AIRBORNE GUMMIES PO) Take 3 tablets by mouth daily. immune system       Vitamin D, Ergocalciferol, (DRISDOL) 1.25 MG (50000 UNIT) CAPS capsule Take 1 capsule (50,000 Units total) by mouth every 7 (seven) days. 4 capsule 0      No current facility-administered medications for this visit.        REVIEW OF SYSTEMS:    [X]  denotes positive finding, [ ]  denotes negative finding Cardiac   Comments:  Chest pain or chest pressure:      Shortness of breath upon exertion:      Short of breath when lying flat:      Irregular heart rhythm: x           Vascular      Pain in calf, thigh, or hip brought on by ambulation:      Pain in feet at night that wakes you up from your sleep:       Blood clot in your veins:      Leg swelling:              Pulmonary      Oxygen at home:      Productive cough:       Wheezing:              Neurologic      Sudden weakness in arms or legs:       Sudden numbness in arms or legs:  Sudden onset of difficulty speaking or slurred speech:      Temporary loss of vision in one eye:       Problems with dizziness:              Gastrointestinal      Blood in stool:       Vomited blood:              Genitourinary      Burning when urinating:       Blood in urine:             Psychiatric      Major depression:              Hematologic      Bleeding problems:      Problems with blood clotting too easily:             Skin      Rashes or ulcers:             Constitutional      Fever or chills:          PHYSICAL EXAM:        Vitals:    05/10/23 1358 05/10/23 1400  BP: 117/88 122/61  Pulse: 72    Resp: 20    Temp: 98.1 F (36.7 C)    SpO2: 96%    Weight: 234 lb (106.1 kg)    Height: 5\' 3"  (1.6 m)        GENERAL: The patient is a well-nourished  female, in no acute distress. The vital signs are documented above. CARDIAC: There is a regular rate and rhythm.  PULMONARY: Non-labored respirations MUSCULOSKELETAL: There are no major deformities or cyanosis. NEUROLOGIC: No focal weakness or paresthesias are detected. SKIN: There are no ulcers or rashes noted. PSYCHIATRIC: The patient has a normal affect.   STUDIES:    I have reviewed the following CT angiogram:  Aortic atherosclerosis. 2. Advanced calcified plaque at the right carotid bifurcation and proximal ICA bulb. Minimal diameter is only 1.3 cm. Compared to a more distal cervical ICA diameter of 4 mm, this indicates a 70% stenosis. Beyond that, the cervical ICA is patent to the skull base. 3. Wide patency of the left carotid bifurcation and proximal ICA bulb. Previous carotid endarterectomy. Mild narrowing of the distal ICA bulb, 3.5 mm in diameter, stenosis only 10%. 4. 30% stenosis of the both subclavian arteries proximal to the vertebral artery origins. 30% stenosis of the left vertebral artery origin. 5. No intracranial large vessel occlusion or proximal stenosis.   MEDICAL ISSUES:    Asymptomatic right carotid stenosis: CT scan shows at least a 70% stenosis on the right and duplex shows greater than 80%.  She is asymptomatic.  I have recommended that we proceed with right carotid endarterectomy.  We discussed the risk of stroke and nerve injury.  I would like to get this done within the next few months.  She is currently being evaluated for new onset atrial fibrillation.  She has had cardioversion in the past which lasted for 4 days.  She is scheduled to see cardiology tomorrow.  She would need to be off of her Eliquis prior to surgery and so I will let cardiology dictate the timing.       Charlena Cross, MD, FACS [facility] [phone] Pager [phone]

## 2023-08-18 NOTE — Progress Notes (Signed)
Patient brought to 4E from PACU. VSS. Telemetry box applied, CCMD notified. Patient oriented to room and staff. Call bell in reach.   Hannah Crill L Zavannah Deblois, RN  

## 2023-08-18 NOTE — Anesthesia Postprocedure Evaluation (Signed)
Anesthesia Post Note  Patient: Kristina [name]  Procedure(s) Performed: RIGHT CAROTID ENDARTERECTOMY WITH PATCH ANGIOPLASTY (Right)     Patient location during evaluation: PACU Anesthesia Type: General Level of consciousness: awake and alert Pain management: pain level controlled Vital Signs Assessment: post-procedure vital signs reviewed and stable Respiratory status: spontaneous breathing, nonlabored ventilation and respiratory function stable Cardiovascular status: stable and blood pressure returned to baseline Anesthetic complications: no   No notable events documented.  Last Vitals:  Vitals:   08/18/23 1558 08/18/23 1600  BP:  (!) 133/47  Pulse: (!) 54 (!) 52  Resp: 14 12  Temp: 36.7 C   SpO2: 95% 95%    Last Pain:  Vitals:   08/18/23 1545  TempSrc:   PainSc: 4                  Beryle Lathe

## 2023-08-18 NOTE — Anesthesia Procedure Notes (Signed)
Procedure Name: Intubation Date/Time: 08/18/2023 1:04 PM  Performed by: Georgianne Fick D, CRNAPre-anesthesia Checklist: Patient identified, Emergency Drugs available, Suction available and Patient being monitored Patient Re-evaluated:Patient Re-evaluated prior to induction Oxygen Delivery Method: Circle System Utilized Preoxygenation: Pre-oxygenation with 100% oxygen Induction Type: IV induction Ventilation: Mask ventilation without difficulty Laryngoscope Size: Mac and 3 Grade View: Grade I Tube type: Oral Tube size: 7.0 mm Number of attempts: 1 Airway Equipment and Method: Stylet Placement Confirmation: ETT inserted through vocal cords under direct vision, positive ETCO2 and breath sounds checked- equal and bilateral Secured at: 21 cm Tube secured with: Tape Dental Injury: Teeth and Oropharynx as per pre-operative assessment

## 2023-08-18 NOTE — Op Note (Signed)
Patient name: Kristina [name] MRN: [id number] DOB: 10-02-1946 Sex: female  08/18/2023 Pre-operative Diagnosis: Asymptomatic  right carotid stenosis Post-operative diagnosis:  Same Surgeon:  Durene Cal Assistants:  Clinton Gallant Procedure:    right carotid Endarterectomy with bovine pericardial patch angioplasty Anesthesia:  General Blood Loss:  100 cc Specimens: None  Findings:  80 %stenosis; Thrombus:  none  Indications: This is a 77 year old female with previous left-sided carotid endarterectomy who now has greater than 80% right-sided stenosis.  She is asymptomatic.  She comes in today for endarterectomy.  Procedure:  The patient was identified in the holding area and taken to [facility] OR ROOM 16  The patient was then placed supine on the table.   General endotrachial anesthesia was administered.  The patient was prepped and draped in the usual sterile fashion.  A time out was called and antibiotics were administered.  A PA was necessary to expedite the procedure and assist with technical details.  She help with exposure by providing suction and retraction.  She help with the anastomosis by following the suture.  She help with wound closure.  The incision was made along the anterior border of the right sternocleidomastoid muscle.  Cautery was used to dissect through the subcutaneous tissue.  The platysma muscle was divided with cautery.  The internal jugular vein was exposed along its anterior medial border.  The common facial vein was exposed and then divided between 2-0 silk ties and metal clips.  The common carotid artery was then circumferentially exposed and encircled with an umbilical tape.  The vagus nerve was identified and protected.  Next sharp dissection was used to expose the external carotid artery and the superior thyroid artery.  The were encircled with a blue vessel loop and a 2-0 silk tie respectively.  Finally, the internal carotid was carefully dissected free.  An umbilical  tape was placed around the internal carotid artery distal to the diseased segment.  The hypoglossal nerve was visualized throughout and protected.  The patient was given systemic heparinization.  A bovine carotid patch was selected and prepared on the back table.  A 10 french shunt was also prepared.  After blood pressure readings were appropriate and the heparin had been given time to circulate, the internal carotid artery was occluded with a baby Gregory clamp.  The external and common carotid arteries were then occluded with vascular clamps and the 2-0 tie tightened on the superior thyroid artery.  A #11 blade was used to make an arteriotomy in the common carotid artery.  This was extended with Potts scissors along the anterior and lateral border of the common and internal carotid artery.  Approximately 80% stenosis was identified.  There was no thrombus identified.  The 10 french shunt was not placed as there was excellent backbleeding.  A kleiner kuntz elevator was used to perform endarterectomy.  An eversion endarterectomy was performed in the external carotid artery.  A good distal endpoint was obtained in the internal carotid artery.  The specimen was removed and sent to pathology.  Heparinized saline was used to irrigate the endarterectomized field.  All potential embolic debris was removed.  Bovine pericardial patch angioplasty was then performed using a running 6-0 Prolene. The common internal and external carotid arteries were all appropriately flushed. The artery was again irrigated with heparin saline.  The anastomosis was then secured. The clamp was first released on the external carotid artery followed by the common carotid artery approximately 30 seconds later, bloodflow was reestablish  through the internal carotid artery.  Next, a hand-held  Doppler was used to evaluate the signals in the common, external, and internal  carotid arteries, all of which had appropriate signals. I then administered   50 mg protamine. The wound was then irrigated.  After hemostasis was achieved, the carotid sheath was reapproximated with 3-0 Vicryl. The  platysma muscle was reapproximated with running 3-0 Vicryl. The skin  was closed with 4-0 Vicryl. Dermabond was placed on the skin. The  patient was then successfully extubated. Her neurologic exam was  similar to his preprocedural exam. The patient was then taken to recovery room  in stable condition. There were no complications.     Disposition:  To PACU in stable condition.  Relevant Operative Details: Approximately 80% right bifurcation stenosis.  No shunt was necessary as there was excellent backbleeding.  Endarterectomy was performed and a bovine pericardial patch was placed.  The patient awoke from anesthesia neurologically intact  V. Durene Cal, M.D., [facility] [place] Office: [phone] Pager:  [phone]

## 2023-08-18 NOTE — Transfer of Care (Signed)
Immediate Anesthesia Transfer of Care Note  Patient: Kristina [name]  Procedure(s) Performed: RIGHT CAROTID ENDARTERECTOMY WITH PATCH ANGIOPLASTY (Right)  Patient Location: PACU  Anesthesia Type:General  Level of Consciousness: awake, alert , and patient cooperative  Airway & Oxygen Therapy: Patient Spontanous Breathing and Patient connected to face mask oxygen  Post-op Assessment: Report given to RN, Post -op Vital signs reviewed and stable, Patient moving all extremities, and Patient moving all extremities X 4  Post vital signs: Reviewed and stable  Last Vitals:  Vitals Value Taken Time  BP    Temp    Pulse 64 08/18/23 1511  Resp 12 08/18/23 1511  SpO2 99 % 08/18/23 1511  Vitals shown include unfiled device data.  Last Pain:  Vitals:   08/18/23 1153  TempSrc:   PainSc: 0-No pain         Complications: No notable events documented.

## 2023-08-19 ENCOUNTER — Encounter (HOSPITAL_COMMUNITY): Payer: Self-pay | Admitting: Surgery

## 2023-08-19 LAB — BASIC METABOLIC PANEL
Anion gap: 7 (ref 5–15)
BUN: 17 mg/dL (ref 8–23)
CO2: 23 mmol/L (ref 22–32)
Calcium: 8.4 mg/dL — ABNORMAL LOW (ref 8.9–10.3)
Chloride: 102 mmol/L (ref 98–111)
Creatinine, Ser: 0.88 mg/dL (ref 0.44–1.00)
GFR, Estimated: >60 mL/min (ref >60)
Glucose, Bld: 121 mg/dL — ABNORMAL HIGH (ref 70–99)
Potassium: 4 mmol/L (ref 3.5–5.1)
Sodium: 132 mmol/L — ABNORMAL LOW (ref 135–145)

## 2023-08-19 LAB — CBC
HCT: 32.4 % — ABNORMAL LOW (ref 36.0–46.0)
Hemoglobin: 9.9 g/dL — ABNORMAL LOW (ref 12.0–15.0)
MCH: 19.8 pg — ABNORMAL LOW (ref 26.0–34.0)
MCHC: 30.6 g/dL (ref 30.0–36.0)
MCV: 64.9 fL — ABNORMAL LOW (ref 80.0–100.0)
Platelets: 284 10*3/uL (ref 150–400)
RBC: 4.99 MIL/uL (ref 3.87–5.11)
RDW: 15.8 % — ABNORMAL HIGH (ref 11.5–15.5)
WBC: 9.2 10*3/uL (ref 4.0–10.5)
nRBC: 0 % (ref 0.0–0.2)

## 2023-08-19 LAB — LIPID PANEL
Cholesterol: 125 mg/dL (ref 0–200)
HDL: 59 mg/dL (ref >40)
LDL Cholesterol: 55 mg/dL (ref 0–99)
Total CHOL/HDL Ratio: 2.1 {ratio}
Triglycerides: 57 mg/dL (ref <150)
VLDL: 11 mg/dL (ref 0–40)

## 2023-08-19 LAB — MAGNESIUM: Magnesium: 2 mg/dL (ref 1.7–2.4)

## 2023-08-19 MED ORDER — ASPIRIN 81 MG PO TBEC: 81.0000 mg | DELAYED_RELEASE_TABLET | Freq: Every day | ORAL | 12 refills | Status: AC

## 2023-08-19 MED ORDER — OXYCODONE HCL 5 MG PO TABS: 5.0000 mg | ORAL_TABLET | Freq: Four times a day (QID) | ORAL | 0 refills | Status: DC | PRN

## 2023-08-19 NOTE — Discharge Instructions (Addendum)
[facility]  Discharge Instructions   Carotid Surgery  Please refer to the following instructions for your post-procedure care. Your surgeon or physician assistant will discuss any changes with you.  Activity  You are encouraged to walk as much as you can. You can slowly return to normal activities but must avoid strenuous activity and heavy lifting until your doctor tell you it's okay. Avoid activities such as vacuuming or swinging a golf club. You can drive after one week if you are comfortable and you are no longer taking prescription pain medications. It is normal to feel tired for serval weeks after your surgery. It is also normal to have difficulty with sleep habits, eating, and bowel movements after surgery. These will go away with time.  Bathing/Showering  Shower daily after you go home. Do not soak in a bathtub, hot tub, or swim until the incision heals completely.  Incision Care  Shower every day. Clean your incision with mild soap and water. Pat the area dry with a clean towel. You do not need a bandage unless otherwise instructed. Do not apply any ointments or creams to your incision. You may have skin glue on your incision. Do not peel it off. It will come off on its own in about one week. Your incision may feel thickened and raised for several weeks after your surgery. This is normal and the skin will soften over time.   For Men Only: It's okay to shave around the incision but do not shave the incision itself for 2 weeks. It is common to have numbness under your chin that could last for several months.  Diet  Resume your normal diet. There are no special food restrictions following this procedure. A low fat/low cholesterol diet is recommended for all patients with vascular disease. In order to heal from your surgery, it is CRITICAL to get adequate nutrition. Your body requires vitamins, minerals, and protein. Vegetables are the best source of  vitamins and minerals. Vegetables also provide the perfect balance of protein. Processed food has little nutritional value, so try to avoid this.  Medications  Resume taking all of your medications unless your doctor or physician assistant tells you not to. If your incision is causing pain, you may take over-the- counter pain relievers such as acetaminophen (Tylenol). If you were prescribed a stronger pain medication, please be aware these medications can cause nausea and constipation. Prevent nausea by taking the medication with a snack or meal. Avoid constipation by drinking plenty of fluids and eating foods with a high amount of fiber, such as fruits, vegetables, and grains.  Do not take Tylenol if you are taking prescription pain medications.  Follow Up  Our office will schedule a follow up appointment 2-3 weeks following discharge.  Please call us immediately for any of the following conditions   Increased pain, redness, drainage (pus) from your incision site.  Fever of 101 degrees or higher.  If you should develop stroke (slurred speech, difficulty swallowing, weakness on one side of your body, loss of vision) you should call 911 and go to the nearest emergency room.   Reduce your risk of vascular disease:   Stop smoking. If you would like help call [facility] at 1-800-QUIT-NOW (609)682-4328) or [place] at (581)712-9130.  Manage your cholesterol  Maintain a desired weight  Control your diabetes  Keep your blood pressure down   If you have any questions, please call the office at [phone]. -------------------------------------------------------  Information  on my medicine - ELIQUIS (apixaban)  This medication education was reviewed with me or my healthcare representative as part of my discharge preparation.    Why was Eliquis prescribed for you? Eliquis was prescribed for you to reduce the risk of a blood clot forming that can cause a stroke if you have a  medical condition called atrial fibrillation (a type of irregular heartbeat).  What do You need to know about Eliquis ? Take your Eliquis TWICE DAILY - one tablet in the morning and one tablet in the evening with or without food. If you have difficulty swallowing the tablet whole please discuss with your pharmacist how to take the medication safely.  Take Eliquis exactly as prescribed by your doctor and DO NOT stop taking Eliquis without talking to the doctor who prescribed the medication.  Stopping may increase your risk of developing a stroke.  Refill your prescription before you run out.  After discharge, you should have regular check-up appointments with your healthcare provider that is prescribing your Eliquis.  In the future your dose may need to be changed if your kidney function or weight changes by a significant amount or as you get older.  What do you do if you miss a dose? If you miss a dose, take it as soon as you remember on the same day and resume taking twice daily.  Do not take more than one dose of ELIQUIS at the same time to make up a missed dose.  Important Safety Information A possible side effect of Eliquis is bleeding. You should call your healthcare provider right away if you experience any of the following: ? Bleeding from an injury or your nose that does not stop. ? Unusual colored urine (red or dark brown) or unusual colored stools (red or black). ? Unusual bruising for unknown reasons. ? A serious fall or if you hit your head (even if there is no bleeding).  Some medicines may interact with Eliquis and might increase your risk of bleeding or clotting while on Eliquis. To help avoid this, consult your healthcare provider or pharmacist prior to using any new prescription or non-prescription medications, including herbals, vitamins, non-steroidal anti-inflammatory drugs (NSAIDs) and supplements.  This website has more information on Eliquis (apixaban):  http://www.eliquis.com/eliquis/home

## 2023-08-19 NOTE — Progress Notes (Addendum)
  Progress Note    08/19/2023 8:17 AM 1 Day Post-Op  Subjective:  no major concerns. Sitting up on side of bed eating breakfast. Assisted her to the bathroom with rolling walker   Vitals:   08/19/23 0327 08/19/23 0806  BP: (!) 119/51 139/65  Pulse: (!) 51   Resp: 17 15  Temp: 98 F (36.7 C) 97.8 F (36.6 C)  SpO2: 93%    Physical Exam: Cardiac:  regular Lungs:  non labored Incisions:  right neck incision is intact, mild swelling present, soft. No hematoma Extremities:  moving all extremities without deficits Neurologic: alert and oriented. Speech coherent. Tongue midline. Smile symmetric  CBC    Component Value Date/Time   WBC 9.2 08/19/2023 0345   RBC 4.99 08/19/2023 0345   HGB 9.9 (L) 08/19/2023 0345   HGB 12.0 02/18/2023 1020   HCT 32.4 (L) 08/19/2023 0345   HCT 40.3 02/18/2023 1020   PLT 284 08/19/2023 0345   PLT 294 02/18/2023 1020   MCV 64.9 (L) 08/19/2023 0345   MCV 67 (L) 02/18/2023 1020   MCH 19.8 (L) 08/19/2023 0345   MCHC 30.6 08/19/2023 0345   RDW 15.8 (H) 08/19/2023 0345   RDW 18.1 (H) 02/18/2023 1020   LYMPHSABS 1.2 01/27/2022 0937   EOSABS 0.3 01/27/2022 0937   BASOSABS 0.0 01/27/2022 0937    BMET    Component Value Date/Time   NA 132 (L) 08/19/2023 0345   NA 142 03/26/2023 1538   K 4.0 08/19/2023 0345   CL 102 08/19/2023 0345   CO2 23 08/19/2023 0345   GLUCOSE 121 (H) 08/19/2023 0345   BUN 17 08/19/2023 0345   BUN 23 03/26/2023 1538   CREATININE 0.88 08/19/2023 0345   CALCIUM 8.4 (L) 08/19/2023 0345   GFRNONAA >60 08/19/2023 0345   GFRAA >60 09/28/2019 0500    INR    Component Value Date/Time   INR 1.2 08/12/2023 1200     Intake/Output Summary (Last 24 hours) at 08/19/2023 0817 Last data filed at 08/19/2023 0751 Gross per 24 hour  Intake 1727.13 ml  Output --  Net 1727.13 ml     Assessment/Plan:  77 y.o. female is s/p Right CEA 1 Day Post-Op   Neurologically intact Right neck incision with mild swelling, soft, no  hematoma Tolerating diet and has ambulated without issues Hemodynamically stable She will resume her home Eliquis on 11/15 Stable for discharge home today She will follow up in in our office in 2-3 weeks for incision check   Graceann Congress, PA-C [facility] [phone] 08/19/2023 8:17 AM  I agree with the above.  She is neurologically intact.  Plan for discharge today.  Will restart Eliquis tomorrow   Durene Cal

## 2023-08-19 NOTE — TOC Transition Note (Addendum)
Transition of Care (TOC) - CM/SW Discharge Note Donn Pierini RN, BSN Transitions of Care Unit 4E- RN Case Manager See Treatment Team for direct phone #   Patient Details  Name: Kristina [name] MRN: [id number] Date of Birth: 1946-08-08  Transition of Care [facility]) CM/SW Contact:  Darrold Span, RN Phone Number: 08/19/2023, 10:51 AM   Clinical Narrative:    Pt stable for transition home today, family to transport home.   CM received notice from [facility] liaison that they had [facility] office referral for [facility] needs. Liaison spoke with pt this am and pt informed liaison that she did not feel she needed HH at this time. [facility] will not start services and will let [facility] know pt declined.   No further TOC needs noted, pt has RW at home per bedside RN   Final next level of care: Home/Self Care Barriers to Discharge: No Barriers Identified   Patient Goals and CMS Choice   Choice offered to / list presented to : Patient  Discharge Placement               Home          Discharge Plan and Services Additional resources added to the After Visit Summary for     Discharge Planning Services: CM Consult Post Acute Care Choice: Home Health          DME Arranged: N/A DME Agency: NA       HH Arranged: Patient Refused HH HH Agency: [facility] ([facility] office referral) Date HH Agency Contacted: 08/19/23 Time HH Agency Contacted: 0930 Representative spoke with at [facility] Agency: Clarise Cruz  Social Determinants of Health (SDOH) Interventions SDOH Screenings   Food Insecurity: No Food Insecurity (06/28/2023)  Housing: Low Risk  (06/28/2023)  Transportation Needs: No Transportation Needs (06/28/2023)  Utilities: Not At Risk (06/28/2023)  Depression (PHQ2-9): Medium Risk (05/27/2021)  Tobacco Use: Medium Risk (08/18/2023)     Readmission Risk Interventions    08/19/2023   10:51 AM  Readmission Risk Prevention Plan  Post Dischage Appt Complete  Medication  Screening Complete  Transportation Screening Complete

## 2023-08-19 NOTE — Progress Notes (Signed)
Discharge instructions given. Patient verbalized understanding and all questions were answered.  ?

## 2023-08-19 NOTE — Progress Notes (Signed)
A-line removed per order. No issues

## 2023-08-19 NOTE — Plan of Care (Signed)
  Problem: Clinical Measurements: Goal: Postoperative complications will be avoided or minimized Outcome: Progressing   

## 2023-08-19 NOTE — Progress Notes (Signed)
PHARMACIST LIPID MONITORING   Kristina [name] is a 77 y.o. female admitted on 08/18/2023 with PAD.  Pharmacy has been consulted to optimize lipid-lowering therapy with the indication of secondary prevention for clinical ASCVD.  Recent Labs:  Lipid Panel (last 6 months):   Lab Results  Component Value Date   CHOL 125 08/19/2023   TRIG 57 08/19/2023   HDL 59 08/19/2023   CHOLHDL 2.1 08/19/2023   VLDL 11 08/19/2023   LDLCALC 55 08/19/2023    Hepatic function panel (last 6 months):   Lab Results  Component Value Date   AST 28 08/12/2023   ALT 29 08/12/2023   ALKPHOS 71 08/12/2023   BILITOT 0.7 08/12/2023    SCr (since admission):   Serum creatinine: 0.88 mg/dL 82/95/62 1308 Estimated creatinine clearance: 60.9 mL/min  Current therapy and lipid therapy tolerance Current lipid-lowering therapy: Atorvastatin 20mg  qday Previous lipid-lowering therapies (if applicable):  Documented or reported allergies or intolerances to lipid-lowering therapies (if applicable):   Assessment:   Patient prefers no changes in lipid-lowering therapy at this time due to LDL at goal  Plan:    1.Statin intensity (high intensity recommended for all patients regardless of the LDL):  No statin changes. The patient is already on a high intensity statin.  2.Add ezetimibe (if any one of the following):   Not indicated at this time.  3.Refer to lipid clinic:   No  4.Follow-up with:  Primary care provider - Sigmund Hazel, MD  5.Follow-up labs after discharge:  No changes in lipid therapy, repeat a lipid panel in one year.       Ulyses Southward, PharmD, BCIDP, AAHIVP, CPP Infectious Disease Pharmacist 08/19/2023 8:50 AM

## 2023-08-20 ENCOUNTER — Encounter (HOSPITAL_COMMUNITY): Payer: Self-pay | Admitting: Vascular Surgery

## 2023-08-20 ENCOUNTER — Other Ambulatory Visit: Payer: Self-pay

## 2023-08-20 ENCOUNTER — Observation Stay (HOSPITAL_COMMUNITY)
Admission: EM | Admit: 2023-08-20 | Discharge: 2023-08-22 | Disposition: A | Discharge status: Home / Self Care | Payer: Medicare Other | Attending: Vascular Surgery | Admitting: Vascular Surgery

## 2023-08-20 DIAGNOSIS — X58XXXA Exposure to other specified factors, initial encounter: Secondary | ICD-10-CM | POA: Insufficient documentation

## 2023-08-20 DIAGNOSIS — Z96652 Presence of left artificial knee joint: Secondary | ICD-10-CM | POA: Diagnosis not present

## 2023-08-20 DIAGNOSIS — R6889 Other general symptoms and signs: Secondary | ICD-10-CM | POA: Diagnosis not present

## 2023-08-20 DIAGNOSIS — R1310 Dysphagia, unspecified: Secondary | ICD-10-CM | POA: Diagnosis not present

## 2023-08-20 DIAGNOSIS — I10 Essential (primary) hypertension: Secondary | ICD-10-CM | POA: Diagnosis not present

## 2023-08-20 DIAGNOSIS — G8918 Other acute postprocedural pain: Principal | ICD-10-CM | POA: Insufficient documentation

## 2023-08-20 DIAGNOSIS — I4819 Other persistent atrial fibrillation: Secondary | ICD-10-CM | POA: Diagnosis not present

## 2023-08-20 DIAGNOSIS — S1093XA Contusion of unspecified part of neck, initial encounter: Secondary | ICD-10-CM | POA: Diagnosis not present

## 2023-08-20 DIAGNOSIS — Z7901 Long term (current) use of anticoagulants: Secondary | ICD-10-CM | POA: Insufficient documentation

## 2023-08-20 DIAGNOSIS — R609 Edema, unspecified: Secondary | ICD-10-CM | POA: Diagnosis not present

## 2023-08-20 DIAGNOSIS — Z87891 Personal history of nicotine dependence: Secondary | ICD-10-CM | POA: Insufficient documentation

## 2023-08-20 DIAGNOSIS — Z79899 Other long term (current) drug therapy: Secondary | ICD-10-CM | POA: Insufficient documentation

## 2023-08-20 DIAGNOSIS — Z7982 Long term (current) use of aspirin: Secondary | ICD-10-CM | POA: Insufficient documentation

## 2023-08-20 LAB — CBC WITH DIFFERENTIAL/PLATELET
Abs Immature Granulocytes: 0.02 10*3/uL (ref 0.00–0.07)
Basophils Absolute: 0 10*3/uL (ref 0.0–0.1)
Basophils Relative: 0 %
Eosinophils Absolute: 0.2 10*3/uL (ref 0.0–0.5)
Eosinophils Relative: 3 %
HCT: 33.4 % — ABNORMAL LOW (ref 36.0–46.0)
Hemoglobin: 10 g/dL — ABNORMAL LOW (ref 12.0–15.0)
Immature Granulocytes: 0 %
Lymphocytes Relative: 14 %
Lymphs Abs: 1 10*3/uL (ref 0.7–4.0)
MCH: 20 pg — ABNORMAL LOW (ref 26.0–34.0)
MCHC: 29.9 g/dL — ABNORMAL LOW (ref 30.0–36.0)
MCV: 66.7 fL — ABNORMAL LOW (ref 80.0–100.0)
Monocytes Absolute: 1.1 10*3/uL — ABNORMAL HIGH (ref 0.1–1.0)
Monocytes Relative: 15 %
Neutro Abs: 5.1 10*3/uL (ref 1.7–7.7)
Neutrophils Relative %: 68 %
Platelets: 231 10*3/uL (ref 150–400)
RBC: 5.01 MIL/uL (ref 3.87–5.11)
RDW: 16.2 % — ABNORMAL HIGH (ref 11.5–15.5)
WBC: 7.5 10*3/uL (ref 4.0–10.5)
nRBC: 0 % (ref 0.0–0.2)

## 2023-08-20 LAB — COMPREHENSIVE METABOLIC PANEL
ALT: 26 U/L (ref 0–44)
AST: 22 U/L (ref 15–41)
Albumin: 3.4 g/dL — ABNORMAL LOW (ref 3.5–5.0)
Alkaline Phosphatase: 54 U/L (ref 38–126)
Anion gap: 7 (ref 5–15)
BUN: 13 mg/dL (ref 8–23)
CO2: 28 mmol/L (ref 22–32)
Calcium: 9 mg/dL (ref 8.9–10.3)
Chloride: 105 mmol/L (ref 98–111)
Creatinine, Ser: 0.84 mg/dL (ref 0.44–1.00)
GFR, Estimated: >60 mL/min (ref >60)
Glucose, Bld: 100 mg/dL — ABNORMAL HIGH (ref 70–99)
Potassium: 4.2 mmol/L (ref 3.5–5.1)
Sodium: 140 mmol/L (ref 135–145)
Total Bilirubin: 0.9 mg/dL (ref <1.2)
Total Protein: 6.6 g/dL (ref 6.5–8.1)

## 2023-08-20 LAB — BASIC METABOLIC PANEL
Anion gap: 7 (ref 5–15)
BUN: 16 mg/dL (ref 8–23)
CO2: 26 mmol/L (ref 22–32)
Calcium: 8.9 mg/dL (ref 8.9–10.3)
Chloride: 104 mmol/L (ref 98–111)
Creatinine, Ser: 0.61 mg/dL (ref 0.44–1.00)
GFR, Estimated: >60 mL/min (ref >60)
Glucose, Bld: 100 mg/dL — ABNORMAL HIGH (ref 70–99)
Potassium: 4.3 mmol/L (ref 3.5–5.1)
Sodium: 137 mmol/L (ref 135–145)

## 2023-08-20 LAB — PROTIME-INR
INR: 1.1 (ref 0.8–1.2)
INR: 1.2 (ref 0.8–1.2)
Prothrombin Time: 14.5 s (ref 11.4–15.2)
Prothrombin Time: 14.9 s (ref 11.4–15.2)

## 2023-08-20 LAB — CBC
HCT: 34.6 % — ABNORMAL LOW (ref 36.0–46.0)
Hemoglobin: 10.4 g/dL — ABNORMAL LOW (ref 12.0–15.0)
MCH: 20 pg — ABNORMAL LOW (ref 26.0–34.0)
MCHC: 30.1 g/dL (ref 30.0–36.0)
MCV: 66.7 fL — ABNORMAL LOW (ref 80.0–100.0)
Platelets: 274 10*3/uL (ref 150–400)
RBC: 5.19 MIL/uL — ABNORMAL HIGH (ref 3.87–5.11)
RDW: 16.1 % — ABNORMAL HIGH (ref 11.5–15.5)
WBC: 7.3 10*3/uL (ref 4.0–10.5)
nRBC: 0 % (ref 0.0–0.2)

## 2023-08-20 MED ORDER — LABETALOL HCL 5 MG/ML IV SOLN: 10.0000 mg | INTRAVENOUS | Status: DC | PRN

## 2023-08-20 MED ORDER — SODIUM CHLORIDE 0.9 % IV SOLN: INTRAVENOUS | Status: AC

## 2023-08-20 MED ORDER — PANTOPRAZOLE SODIUM 40 MG PO TBEC
40.0000 mg | DELAYED_RELEASE_TABLET | Freq: Every day | ORAL | Status: DC
  Administered 2023-08-20 – 2023-08-22 (×3): 40 mg via ORAL
  Filled 2023-08-20 (×3): qty 1

## 2023-08-20 MED ORDER — POLYETHYLENE GLYCOL 3350 17 G PO PACK: 17.0000 g | PACK | Freq: Every day | ORAL | Status: DC | PRN

## 2023-08-20 MED ORDER — HYDROMORPHONE HCL 1 MG/ML IJ SOLN
0.5000 mg | INTRAMUSCULAR | Status: DC | PRN
Start: 2023-08-20 — End: 2023-08-22
  Administered 2023-08-20 – 2023-08-21 (×3): 0.5 mg via INTRAVENOUS
  Filled 2023-08-20 (×2): qty 0.5
  Filled 2023-08-20: qty 1

## 2023-08-20 MED ORDER — BISACODYL 10 MG RE SUPP: 10.0000 mg | Freq: Every day | RECTAL | Status: DC | PRN

## 2023-08-20 MED ORDER — ACETAMINOPHEN 325 MG PO TABS
325.0000 mg | ORAL_TABLET | ORAL | Status: DC | PRN
  Administered 2023-08-21: 650 mg via ORAL
  Filled 2023-08-20: qty 2

## 2023-08-20 MED ORDER — LOSARTAN POTASSIUM 50 MG PO TABS
100.0000 mg | ORAL_TABLET | Freq: Every morning | ORAL | Status: DC
  Administered 2023-08-20 – 2023-08-22 (×3): 100 mg via ORAL
  Filled 2023-08-20 (×3): qty 2

## 2023-08-20 MED ORDER — ATORVASTATIN CALCIUM 10 MG PO TABS
20.0000 mg | ORAL_TABLET | Freq: Every morning | ORAL | Status: DC
  Administered 2023-08-20 – 2023-08-22 (×3): 20 mg via ORAL
  Filled 2023-08-20 (×3): qty 2

## 2023-08-20 MED ORDER — ASPIRIN 81 MG PO TBEC
81.0000 mg | DELAYED_RELEASE_TABLET | Freq: Every day | ORAL | Status: DC
  Administered 2023-08-20 – 2023-08-22 (×3): 81 mg via ORAL
  Filled 2023-08-20 (×3): qty 1

## 2023-08-20 MED ORDER — GUAIFENESIN-DM 100-10 MG/5ML PO SYRP: 15.0000 mL | ORAL_SOLUTION | ORAL | Status: DC | PRN

## 2023-08-20 MED ORDER — OXYCODONE HCL 5 MG PO TABS
5.0000 mg | ORAL_TABLET | ORAL | Status: DC | PRN
  Administered 2023-08-20: 5 mg via ORAL
  Administered 2023-08-21 (×2): 10 mg via ORAL
  Administered 2023-08-22: 5 mg via ORAL
  Administered 2023-08-22: 10 mg via ORAL
  Filled 2023-08-20 (×5): qty 2

## 2023-08-20 MED ORDER — FERROUS SULFATE 325 (65 FE) MG PO TABS
325.0000 mg | ORAL_TABLET | Freq: Every morning | ORAL | Status: DC
  Administered 2023-08-20 – 2023-08-21 (×2): 325 mg via ORAL
  Filled 2023-08-20 (×3): qty 1

## 2023-08-20 MED ORDER — POTASSIUM CHLORIDE CRYS ER 20 MEQ PO TBCR
20.0000 meq | EXTENDED_RELEASE_TABLET | Freq: Once | ORAL | Status: AC
  Administered 2023-08-20: 20 meq via ORAL
  Filled 2023-08-20: qty 1

## 2023-08-20 MED ORDER — DOFETILIDE 500 MCG PO CAPS
500.0000 ug | ORAL_CAPSULE | Freq: Two times a day (BID) | ORAL | Status: DC
  Administered 2023-08-20 – 2023-08-22 (×5): 500 ug via ORAL
  Filled 2023-08-20 (×5): qty 1

## 2023-08-20 MED ORDER — METOPROLOL TARTRATE 5 MG/5ML IV SOLN: 2.0000 mg | INTRAVENOUS | Status: DC | PRN

## 2023-08-20 MED ORDER — ALUM & MAG HYDROXIDE-SIMETH 200-200-20 MG/5ML PO SUSP: 15.0000 mL | ORAL | Status: DC | PRN

## 2023-08-20 MED ORDER — HYDRALAZINE HCL 20 MG/ML IJ SOLN: 5.0000 mg | INTRAMUSCULAR | Status: DC | PRN

## 2023-08-20 MED ORDER — ACETAMINOPHEN 650 MG RE SUPP: 325.0000 mg | RECTAL | Status: DC | PRN

## 2023-08-20 MED ORDER — ONDANSETRON HCL 4 MG/2ML IJ SOLN
4.0000 mg | Freq: Four times a day (QID) | INTRAMUSCULAR | Status: DC | PRN
  Administered 2023-08-20: 4 mg via INTRAVENOUS
  Filled 2023-08-20: qty 2

## 2023-08-20 NOTE — H&P (Signed)
H+P  History of Present Illness: This is a 77 y.o. female underwent right carotid endarterectomy with bovine pericardial patch angioplasty for asymptomatic right carotid stenosis 2 days ago.  She was discharged yesterday with mild hematoma.  She states that she took her Eliquis last night and overnight started having difficulty swallowing.  She is not having difficulty clearing her secretions and no difficulty breathing at this time.  She does think the hematoma is larger since going home.  Past Medical History:  Diagnosis Date   Allergic rhinitis    Anemia glaucoma   Anxiety    Atrial fibrillation (HCC)    Back pain    Carotid arterial disease (HCC)    Cataract    Mixed OU   DDD (degenerative disc disease), lumbar    Dysrhythmia    A. Fib   Family history of adverse reaction to anesthesia    daughter has PONV   Heart murmur    ECHO 10/23/22   High blood pressure    High cholesterol    History of stomach ulcers    Hypertensive retinopathy    OU   Peripheral vascular disease (HCC)    Rheumatic fever    Rheumatic fever    Rheumatoid arteritis (HCC)     Past Surgical History:  Procedure Laterality Date   APPENDECTOMY  1955   CARDIOVERSION N/A 02/25/2023   Procedure: CARDIOVERSION;  Surgeon: Jodelle Red, MD;  Location: [facility] INVASIVE CV LAB;  Service: Cardiovascular;  Laterality: N/A;   CHOLECYSTECTOMY  2006   COLONOSCOPY  2023   DILATATION & CURETTAGE/HYSTEROSCOPY WITH MYOSURE N/A 04/15/2022   Procedure: DILATATION & CURETTAGE/HYSTEROSCOPY WITH MYOSURE;  Surgeon: Gerald Leitz, MD;  Location: [facility] OR;  Service: Gynecology;  Laterality: N/A;   ENDARTERECTOMY Left 09/27/2019   Procedure: ENDARTERECTOMY CAROTID LEFT;  Surgeon: Nada Libman, MD;  Location: [facility] OR;  Service: Vascular;  Laterality: Left;   ENDARTERECTOMY Right 08/18/2023   Procedure: RIGHT CAROTID ENDARTERECTOMY WITH PATCH ANGIOPLASTY;  Surgeon: Nada Libman, MD;  Location: [facility] OR;  Service: Vascular;   Laterality: Right;   HERNIA REPAIR  2007   incisional hernia after cholecystecomy   HERNIA REPAIR  2015   incisional hernia   PATCH ANGIOPLASTY Left 09/27/2019   Procedure: Patch Angioplasty Using Livia Snellen;  Surgeon: Nada Libman, MD;  Location: [facility] OR;  Service: Vascular;  Laterality: Left;   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   TONSILLECTOMY  1963   removed at 77 years old    Allergies  Allergen Reactions   Hydrochlorothiazide Other (See Comments)    Should not be used in patients on Tikosyn.    Penicillins Anaphylaxis and Itching    Did it involve swelling of the face/tongue/throat, SOB, or low BP? Yes Did it involve sudden or severe rash/hives, skin peeling, or any reaction on the inside of your mouth or nose? No Did you need to seek medical attention at a hospital or doctor's office? Yes When did it last happen?      Childhood allergy If all above answers are "NO", may proceed with cephalosporin use.    Mite (D. Farinae) Swelling    Other reaction(s): sinus swelling (dust mites)    Prior to Admission medications   Medication Sig Start Date End Date Taking? Authorizing Provider  apixaban (ELIQUIS) 5 MG TABS tablet Take 1 tablet (5 mg total) by mouth 2 (two) times daily. Pt. Was given samples from her PCP enough to last one month 02/18/23  Yes Jarome Lamas, Irving Burton  C, NP  atorvastatin (LIPITOR) 20 MG tablet Take 20 mg by mouth every morning. 12/12/21  Yes [provider]  cyanocobalamin (VITAMIN B12) 1000 MCG tablet Take 1,000 mcg by mouth in the morning.   Yes [provider]  dofetilide (TIKOSYN) 500 MCG capsule Take 1 capsule (500 mcg total) by mouth 2 (two) times daily. 07/08/23  Yes Fenton, Clint R, PA  dorzolamide-timolol (COSOPT) 22.3-6.8 MG/ML ophthalmic solution Place 1 drop into both eyes 2 (two) times daily. 10/17/19  Yes Rennis Chris, MD  ELDERBERRY PO Take 2 tablets by mouth in the morning. Airborne Immune gummy   Yes [provider]  ferrous  sulfate 325 (65 FE) MG EC tablet Take 325 mg by mouth in the morning.   Yes [provider]  GLUCOSAMINE-CHONDROITIN PO Take 1 tablet by mouth daily.   Yes [provider]  losartan (COZAAR) 100 MG tablet Take 100 mg by mouth in the morning.   Yes [provider]  Multiple Vitamin (MULTIVITAMIN) capsule Take 1 capsule by mouth daily. Vita fusion   Yes [provider]  Multiple Vitamins-Minerals (AIRBORNE GUMMIES PO) Take 3 tablets by mouth in the morning. immune system   Yes [provider]  oxyCODONE (OXY IR/ROXICODONE) 5 MG immediate release tablet Take 1 tablet (5 mg total) by mouth every 6 (six) hours as needed for moderate pain (pain score 4-6). 08/19/23  Yes Baglia, Corrina, PA-C  Vitamin D, Ergocalciferol, (DRISDOL) 1.25 MG (50000 UNIT) CAPS capsule Take 1 capsule (50,000 Units total) by mouth every 7 (seven) days. Patient taking differently: Take 50,000 Units by mouth every Monday. 12/31/21  Yes Quillian Quince D, MD  aspirin EC 81 MG tablet Take 1 tablet (81 mg total) by mouth daily at 6 (six) AM. Swallow whole. 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  Tobacco Use   Smoking status: Former    Current packs/day: 0.00    Average packs/day: 1.5 packs/day for 30.0 years (45.0 ttl pk-yrs)    Types: Cigarettes    Start date: 2    Quit date: 2009    Years since quitting: 15.8   Smokeless tobacco: Never   Tobacco comments:    Former smoker 06/28/23  Vaping Use   Vaping status: Never Used  Substance and Sexual Activity   Alcohol use: Yes    Alcohol/week: 2.0 - 3.0 standard drinks of alcohol    Types: 2 - 3 Glasses of wine per week    Drug use: Never   Sexual activity: Not Currently  Other Topics Concern   Not on file  Social History Narrative   Not on file   Social Determinants of Health   Financial Resource Strain: Not on file  Food Insecurity: No Food Insecurity (06/28/2023)   Hunger Vital Sign    Worried About Running Out of Food in the Last Year: Never true    Ran Out of Food in the Last Year: Never true  Transportation Needs: No Transportation Needs (06/28/2023)   PRAPARE - Administrator, Civil Service (Medical): No    Lack of Transportation (Non-Medical): No  Physical Activity: Not  on file  Stress: Not on file  Social Connections: Not on file  Intimate Partner Violence: Not At Risk (06/28/2023)   Humiliation, Afraid, Rape, and Kick questionnaire    Fear of Current or Ex-Partner: No    Emotionally Abused: No    Physically Abused: No    Sexually Abused: No     Family History  Problem Relation Age of Onset   Kidney disease Mother    Hypertension Mother    Glaucoma Mother    Heart failure Father    Emphysema Father    Asthma Father    Hypertension Father    Heart disease Father    COPD Brother    Allergic rhinitis Neg Hx    Angioedema Neg Hx    Atopy Neg Hx    Eczema Neg Hx    Immunodeficiency Neg Hx    Urticaria Neg Hx     ROS: Right neck hematoma, difficulty swallowing   Physical Examination  Vitals:   08/20/23 0620 08/20/23 0622  BP:  (!) 147/58  Pulse:  70  Resp:  19  Temp:  98.1 F (36.7 C)  SpO2: 97% 96%   Body mass index is 42.43 kg/m.  Awake alert and oriented Able to speak without shortness of breath Right neck is soft although there is mild hematoma does not cross the midline  CBC    Component Value Date/Time   WBC 7.5 08/20/2023 0634   RBC 5.01 08/20/2023 0634   HGB 10.0 (L) 08/20/2023 0634   HGB 12.0 02/18/2023 1020   HCT 33.4 (L) 08/20/2023 0634   HCT 40.3 02/18/2023 1020   PLT 231 08/20/2023 0634   PLT 294 02/18/2023 1020   MCV 66.7 (L)  08/20/2023 0634   MCV 67 (L) 02/18/2023 1020   MCH 20.0 (L) 08/20/2023 0634   MCHC 29.9 (L) 08/20/2023 0634   RDW 16.2 (H) 08/20/2023 0634   RDW 18.1 (H) 02/18/2023 1020   LYMPHSABS 1.0 08/20/2023 0634   LYMPHSABS 1.2 01/27/2022 0937   MONOABS 1.1 (H) 08/20/2023 0634   EOSABS 0.2 08/20/2023 0634   EOSABS 0.3 01/27/2022 0937   BASOSABS 0.0 08/20/2023 0634   BASOSABS 0.0 01/27/2022 0937    BMET    Component Value Date/Time   NA 137 08/20/2023 0634   NA 142 03/26/2023 1538   K 4.3 08/20/2023 0634   CL 104 08/20/2023 0634   CO2 26 08/20/2023 0634   GLUCOSE 100 (H) 08/20/2023 0634   BUN 16 08/20/2023 0634   BUN 23 03/26/2023 1538   CREATININE 0.61 08/20/2023 0634   CALCIUM 8.9 08/20/2023 0634   GFRNONAA >60 08/20/2023 0634   GFRAA >60 09/28/2019 0500    COAGS: Lab Results  Component Value Date   INR 1.1 08/20/2023   INR 1.2 08/12/2023   INR 1.4 (H) 06/23/2023     ASSESSMENT/PLAN: This is a 77 y.o. female here with hematoma status post right carotid endarterectomy.  Per reports there was small hematoma yesterday but she says this is worsened and she is feeling like she is struggling to swallow.  Hematoma is soft by my exam does not cross the midline.  We will hold Eliquis and admit her for observation and obtain swallow evaluation and reevaluate later today for possible hematoma evacuation if necessary.  Ella Golomb C. Randie Heinz, MD [facility] Office: [phone] Pager: [phone]

## 2023-08-20 NOTE — Plan of Care (Signed)
  Problem: Clinical Measurements: Goal: Postoperative complications will be avoided or minimized Outcome: Progressing   Problem: Respiratory: Goal: Ability to achieve and maintain a regular respiratory rate will improve Outcome: Progressing   Problem: Skin Integrity: Goal: Demonstration of wound healing without infection will improve Outcome: Progressing

## 2023-08-20 NOTE — ED Notes (Signed)
PT ambulated to bathroom without assistance.

## 2023-08-20 NOTE — ED Notes (Signed)
Pt ambulated to the bathroom without assistnace

## 2023-08-20 NOTE — Progress Notes (Signed)
Handoff report received from ED nurse, Julian Hy

## 2023-08-20 NOTE — ED Provider Notes (Signed)
77 year old female status post right carotid endarterectomy 2 days ago presented last night complaining of swelling and pain in the right anterior neck.  Patient was seen and evaluated by Dr. Bebe Shaggy. Dr. Pascal Lux is coming into see and evaluate patient Patient is complaining of some pain and feeling like there is something in her throat but no difficulty phonating, speaking, or breathing. Patient's blood pressure is 147/58, she is afebrile, rate is regular rate and rhythm with heart rate of 70 and oxygen saturations are 96% Patient does have some redness and swelling on the right anterior lateral neck area over surgical site.  No obvious drainage, mild erythema, Oropharynx reveals some right tonsillar abrasion otherwise appears to be normal Discussed plans with patient. We have discussed with her any need to call nurse or physician to bedside if she has any change in symptoms 8:41 AM Patient remains well.  She is talking with me and not having difficulty breathing.  She reports that Dr.Cain was in the room and plans to admit her.  I reviewed his note that states they will hold the Eliquis and admit her for observation obtain swallow evaluation and consider hematoma evacuation if necessary   Margarita Grizzle, MD 08/20/23 [phone]

## 2023-08-20 NOTE — ED Provider Notes (Signed)
EMERGENCY DEPARTMENT AT [facility] Provider Note   CSN: [id number] Arrival date & time: 08/20/23  4098     History  Chief Complaint  Patient presents with   Post-op Problem    Kristina [name] is a 77 y.o. female.  The history is provided by the patient.  Patient presents for postoperative swelling.  Patient underwent carotid endarterectomy on November 13.  She did well postoperatively and was discharged home.  She restarted her Eliquis yesterday.  She reports over the past several hours she has had increasing swelling and pain to the area.  She reports she is having difficulty swallowing and feels that she is having difficulty breathing.  However she is able to handle her secretions. No fevers or vomiting.     Home Medications Prior to Admission medications   Medication Sig Start Date End Date Taking? Authorizing Provider  apixaban (ELIQUIS) 5 MG TABS tablet Take 1 tablet (5 mg total) by mouth 2 (two) times daily. Pt. Was given samples from her PCP enough to last one month 02/18/23   Joylene Grapes, NP  aspirin EC 81 MG tablet Take 1 tablet (81 mg total) by mouth daily at 6 (six) AM. Swallow whole. 08/20/23   Baglia, Corrina, PA-C  atorvastatin (LIPITOR) 20 MG tablet Take 20 mg by mouth every morning. 12/12/21   [provider]  calcium elemental as carbonate (BARIATRIC TUMS ULTRA) 400 MG chewable tablet Chew 1,000 mg by mouth in the morning.    [provider]  cyanocobalamin (VITAMIN B12) 1000 MCG tablet Take 1,000 mcg by mouth in the morning.    [provider]  dofetilide (TIKOSYN) 500 MCG capsule Take 1 capsule (500 mcg total) by mouth 2 (two) times daily. 07/08/23   Fenton, Clint R, PA  dorzolamide-timolol (COSOPT) 22.3-6.8 MG/ML ophthalmic solution Place 1 drop into both eyes 2 (two) times daily. 10/17/19   Rennis Chris, MD  ELDERBERRY PO Take 2 tablets by mouth in the morning. Airborne Immune gummy    [provider]   ferrous sulfate 325 (65 FE) MG EC tablet Take 325 mg by mouth in the morning.    [provider]  GLUCOSAMINE-CHONDROITIN PO Take 1 tablet by mouth daily.    [provider]  losartan (COZAAR) 100 MG tablet Take 100 mg by mouth in the morning.    [provider]  Multiple Vitamin (MULTIVITAMIN) capsule Take 1 capsule by mouth daily. Vita fusion    [provider]  Multiple Vitamins-Minerals (AIRBORNE GUMMIES PO) Take 3 tablets by mouth in the morning. immune system    [provider]  oxyCODONE (OXY IR/ROXICODONE) 5 MG immediate release tablet Take 1 tablet (5 mg total) by mouth every 6 (six) hours as needed for moderate pain (pain score 4-6). 08/19/23   Baglia, Corrina, PA-C  Vitamin D, Ergocalciferol, (DRISDOL) 1.25 MG (50000 UNIT) CAPS capsule Take 1 capsule (50,000 Units total) by mouth every 7 (seven) days. Patient taking differently: Take 50,000 Units by mouth every Monday. 12/31/21   Quillian Quince D, MD      Allergies    Hydrochlorothiazide, Penicillins, and Mite (d. farinae)    Review of Systems   Review of Systems  Constitutional:  Negative for fever.  Gastrointestinal:  Negative for vomiting.    Physical Exam Updated Vital Signs BP (!) 147/58 (BP Location: Left Arm)   Pulse 70   Temp 98.1 F (36.7 C) (Oral)   Resp 19   Ht 1.575 m (  5\' 2" )   Wt 105.2 kg   SpO2 96%   BMI 42.43 kg/m  Physical Exam CONSTITUTIONAL: Elderly, anxious HEAD: Normocephalic/atraumatic EYES: EOMI ENMT: Mucous membranes moist Uvula midline, no angioedema.  No stridor, no drooling.  No dysphonia NECK: See photo below.  Incision to right side of her neck is erythematous and tender.  There is no pulsating mass. No crepitus.  No drainage CV: S1/S2 noted, no murmurs/rubs/gallops noted LUNGS: Lungs are clear to auscultation bilaterally, no apparent distress NEURO: Pt is awake/alert/appropriate, moves all extremitiesx4.  No facial droop.   PSYCH:  Anxious   ED Results / Procedures / Treatments   Labs (all labs ordered are listed, but only abnormal results are displayed) Labs Reviewed  CBC WITH DIFFERENTIAL/PLATELET  BASIC METABOLIC PANEL  PROTIME-INR    EKG None  Radiology No results found.  Procedures Procedures    Medications Ordered in ED Medications - No data to display  ED Course/ Medical Decision Making/ A&P Clinical Course as of 08/20/23 0703  Caleen Essex Aug 20, 2023  2536 Patient reports she had right carotid endarterectomy on November 13.  She did well initially, but notes over the past several hours she has had increasing pain and swelling.  Patient appears anxious but is in no other acute distress.  There is no stridor, no drooling and no dysphonia.  Will consult vascular surgery [DW]  [phone] Discussed with on-call vascular surgeon Dr. Randie Heinz, he will see the patient [DW]  [phone] Signed out to dr ray to f/u on vascular recs [DW]    Clinical Course User Index [DW] Zadie Rhine, MD                                 Medical Decision Making Amount and/or Complexity of Data Reviewed Labs: ordered.           Final Clinical Impression(s) / ED Diagnoses Final diagnoses:  Postoperative pain    Rx / DC Orders ED Discharge Orders     None         Zadie Rhine, MD 08/20/23 [phone]

## 2023-08-20 NOTE — Evaluation (Signed)
Clinical/Bedside Swallow Evaluation Patient Details  Name: Kristina [name] MRN: [id number] Date of Birth: 1946-07-09  Today's Date: 08/20/2023 Time: SLP Start Time (ACUTE ONLY): 0950 SLP Stop Time (ACUTE ONLY): 1005 SLP Time Calculation (min) (ACUTE ONLY): 15 min  Past Medical History:  Past Medical History:  Diagnosis Date   Allergic rhinitis    Anemia glaucoma   Anxiety    Atrial fibrillation (HCC)    Back pain    Carotid arterial disease (HCC)    Cataract    Mixed OU   DDD (degenerative disc disease), lumbar    Dysrhythmia    A. Fib   Family history of adverse reaction to anesthesia    daughter has PONV   Heart murmur    ECHO 10/23/22   High blood pressure    High cholesterol    History of stomach ulcers    Hypertensive retinopathy    OU   Peripheral vascular disease (HCC)    Rheumatic fever    Rheumatic fever    Rheumatoid arteritis (HCC)    Past Surgical History:  Past Surgical History:  Procedure Laterality Date   APPENDECTOMY  1955   CARDIOVERSION N/A 02/25/2023   Procedure: CARDIOVERSION;  Surgeon: Jodelle Red, MD;  Location: [facility] INVASIVE CV LAB;  Service: Cardiovascular;  Laterality: N/A;   CHOLECYSTECTOMY  2006   COLONOSCOPY  2023   DILATATION & CURETTAGE/HYSTEROSCOPY WITH MYOSURE N/A 04/15/2022   Procedure: DILATATION & CURETTAGE/HYSTEROSCOPY WITH MYOSURE;  Surgeon: Gerald Leitz, MD;  Location: [facility] OR;  Service: Gynecology;  Laterality: N/A;   ENDARTERECTOMY Left 09/27/2019   Procedure: ENDARTERECTOMY CAROTID LEFT;  Surgeon: Nada Libman, MD;  Location: [facility] OR;  Service: Vascular;  Laterality: Left;   ENDARTERECTOMY Right 08/18/2023   Procedure: RIGHT CAROTID ENDARTERECTOMY WITH PATCH ANGIOPLASTY;  Surgeon: Nada Libman, MD;  Location: [facility] OR;  Service: Vascular;  Laterality: Right;   HERNIA REPAIR  2007   incisional hernia after cholecystecomy   HERNIA REPAIR  2015   incisional hernia   PATCH ANGIOPLASTY Left 09/27/2019   Procedure:  Patch Angioplasty Using Livia Snellen;  Surgeon: Nada Libman, MD;  Location: [facility] OR;  Service: Vascular;  Laterality: Left;   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   TONSILLECTOMY  1963   removed at 77 years old   HPI:  Pt is a 77 y.o. female who presented to ED 11/15 with complaints of difficulty swallowing and enlarging R hematoma. Pt  underwent right carotid endarterectomy with bovine pericardial patch angioplasty for asymptomatic right carotid stenosis on 11/13. She was discharged 11/14 with mild hematoma. PMHx: CAD, HTN, afib, aortic stenosis, anxiety    Assessment / Plan / Recommendation  Clinical Impression  Pt seen for clinical swallowing evaluation. Pt demonstrated a functional oral swallow and pharyngeal swallow function appeared [facility] per clinical assessment; however, pt endorsed mild odynophagia and globus sensation with solids. Recommend a mech soft diet with thin liquids with standard aspiration precautions. Given pt's complaints of worsening hematoma and increased difficulty swallowing since recent d/c, SLP to f/u x1 for diet tolerance. SLP Visit Diagnosis: Dysphagia, unspecified (R13.10)    Aspiration Risk  Mild aspiration risk    Diet Recommendation Dysphagia 3 (Mech soft);Thin liquid    Liquid Administration via: Cup;Straw;Spoon Medication Administration: Whole meds with liquid (as tolerated) Supervision: Patient able to self feed Compensations: Slow rate;Small sips/bites;Follow solids with liquid Postural Changes: Seated upright at 90 degrees;Remain upright for at least 30 minutes after po intake    Other  Recommendations Oral  Care Recommendations: Oral care BID;Patient independent with oral care    Recommendations for follow up therapy are one component of a multi-disciplinary discharge planning process, led by the attending physician.  Recommendations may be updated based on patient status, additional functional criteria and insurance authorization.  Follow up  Recommendations No SLP follow up (none anticipated at this time)         Functional Status Assessment Patient has had a recent decline in their functional status and demonstrates the ability to make significant improvements in function in a reasonable and predictable amount of time.  Frequency and Duration min 1 x/week  1 week       Prognosis Prognosis for improved oropharyngeal function: Good      Swallow Study   General Date of Onset: 08/19/23 HPI: Pt is a 77 y.o. female who presented to ED 11/15 with complaints of difficulty swallowing and enlarging R hematoma. Pt  underwent right carotid endarterectomy with bovine pericardial patch angioplasty for asymptomatic right carotid stenosis on 11/13. She was discharged 11/14 with mild hematoma. PMHx: CAD, HTN, afib, aortic stenosis. Type of Study: Bedside Swallow Evaluation Previous Swallow Assessment: none Diet Prior to this Study: NPO Temperature Spikes Noted: No Respiratory Status: Nasal cannula (3L/min) History of Recent Intubation: Yes Total duration of intubation (days):  (for procedure on 11/13) Behavior/Cognition: Alert;Cooperative;Pleasant mood Oral Cavity Assessment: Within Functional Limits Oral Care Completed by SLP: No Oral Cavity - Dentition: Dentures, top;Dentures, bottom Vision: Functional for self-feeding Self-Feeding Abilities: Able to feed self Patient Positioning: Upright in bed Baseline Vocal Quality: Normal Volitional Cough: Strong Volitional Swallow: Able to elicit    Oral/Motor/Sensory Function Overall Oral Motor/Sensory Function: Within functional limits   Ice Chips Ice chips: Not tested   Thin Liquid Thin Liquid: Within functional limits Presentation: Straw    Nectar Thick Nectar Thick Liquid: Not tested   Honey Thick Honey Thick Liquid: Not tested   Puree Puree: Within functional limits Presentation: Self Fed   Solid     Solid: Within functional limits Presentation: Self Fed Other Comments:  endorses mild odynophagia and globus sensation; alleviated with liquid wash     Clyde Canterbury, M.S., CCC-SLP Speech-Language Pathologist Secure Chat Preferred  O: [phone]  Kristina [name] 08/20/2023,10:26 AM

## 2023-08-20 NOTE — ED Triage Notes (Signed)
Pt from home. Had surgery on rt cairted artery wed. Now difficulty swallowing, breathing and increased swelling at surgery site.

## 2023-08-20 NOTE — Progress Notes (Signed)
   08/20/23 2052  Vitals  Temp 98 F (36.7 C)  Temp Source Oral  BP 100/86  MAP (mmHg) 92  BP Location Right Arm  BP Method Automatic  Patient Position (if appropriate) Lying  Pulse Rate 87  Pulse Rate Source Monitor  ECG Heart Rate 84  Resp 18  Level of Consciousness  Level of Consciousness Alert  MEWS COLOR  MEWS Score Color Green  Oxygen Therapy  SpO2 96 %  O2 Device Room Air  MEWS Score  MEWS Temp 0  MEWS Systolic 1  MEWS Pulse 0  MEWS RR 0  MEWS LOC 0  MEWS Score 1   Pt admitted to [facility]. Pt oriented to unit. Pt denies pain, difficulty breathing and difficulty swallowing. Pt stated breathing and swallowing are better than before. CHG bath given. Call light by pt.

## 2023-08-20 NOTE — ED Notes (Signed)
PT sitting in a chair next to stretcher, for comfort reasons at this time.

## 2023-08-20 NOTE — ED Notes (Signed)
ED TO INPATIENT HANDOFF REPORT  ED Nurse Name and Phone #: Dot Lanes, Paramedic   S Name/Age/Gender Kristina [name] 77 y.o. female Room/Bed: 034C/034C  Code Status   Code Status: Full Code  Home/SNF/Other Home Patient oriented to: self, place, time, and situation Is this baseline? Yes   Triage Complete: Triage complete  Chief Complaint Hematoma of neck [S10.93XA]  Triage Note Pt from home. Had surgery on rt cairted artery wed. Now difficulty swallowing, breathing and increased swelling at surgery site.   Allergies Allergies  Allergen Reactions   Hydrochlorothiazide Other (See Comments)    Should not be used in patients on Tikosyn.    Penicillins Anaphylaxis and Itching    Did it involve swelling of the face/tongue/throat, SOB, or low BP? Yes Did it involve sudden or severe rash/hives, skin peeling, or any reaction on the inside of your mouth or nose? No Did you need to seek medical attention at a hospital or doctor's office? Yes When did it last happen?      Childhood allergy If all above answers are "NO", may proceed with cephalosporin use.    Mite (D. Farinae) Swelling    Other reaction(s): sinus swelling (dust mites)    Level of Care/Admitting Diagnosis ED Disposition     ED Disposition  Admit   Condition  --   Comment  Hospital Area: [facility] [[id number]]  Level of Care: Progressive [102]  Admit to Progressive based on following criteria: CARDIOVASCULAR & THORACIC of moderate stability with acute coronary syndrome symptoms/low risk myocardial infarction/hypertensive urgency/arrhythmias/heart failure potentially compromising stability and stable post cardiovascular intervention patients.  May place patient in observation at [facility] or [facility] if equivalent level of care is available:: Yes  Covid Evaluation: Asymptomatic - no recent exposure (last 10 days) testing not required  Diagnosis: Hematoma of neck [725321]  Admitting Physician:  Maeola Harman [[id number]]  Attending Physician: Maeola Harman [[id number]]  Bed request comments: 4E          B Medical/Surgery History Past Medical History:  Diagnosis Date   Allergic rhinitis    Anemia glaucoma   Anxiety    Atrial fibrillation (HCC)    Back pain    Carotid arterial disease (HCC)    Cataract    Mixed OU   DDD (degenerative disc disease), lumbar    Dysrhythmia    A. Fib   Family history of adverse reaction to anesthesia    daughter has PONV   Heart murmur    ECHO 10/23/22   High blood pressure    High cholesterol    History of stomach ulcers    Hypertensive retinopathy    OU   Peripheral vascular disease (HCC)    Rheumatic fever    Rheumatic fever    Rheumatoid arteritis (HCC)    Past Surgical History:  Procedure Laterality Date   APPENDECTOMY  1955   CARDIOVERSION N/A 02/25/2023   Procedure: CARDIOVERSION;  Surgeon: Jodelle Red, MD;  Location: [facility] INVASIVE CV LAB;  Service: Cardiovascular;  Laterality: N/A;   CHOLECYSTECTOMY  2006   COLONOSCOPY  2023   DILATATION & CURETTAGE/HYSTEROSCOPY WITH MYOSURE N/A 04/15/2022   Procedure: DILATATION & CURETTAGE/HYSTEROSCOPY WITH MYOSURE;  Surgeon: Gerald Leitz, MD;  Location: [facility] OR;  Service: Gynecology;  Laterality: N/A;   ENDARTERECTOMY Left 09/27/2019   Procedure: ENDARTERECTOMY CAROTID LEFT;  Surgeon: Nada Libman, MD;  Location: [facility] OR;  Service: Vascular;  Laterality: Left;   ENDARTERECTOMY Right 08/18/2023   Procedure:  RIGHT CAROTID ENDARTERECTOMY WITH PATCH ANGIOPLASTY;  Surgeon: Nada Libman, MD;  Location: [facility] OR;  Service: Vascular;  Laterality: Right;   HERNIA REPAIR  2007   incisional hernia after cholecystecomy   HERNIA REPAIR  2015   incisional hernia   PATCH ANGIOPLASTY Left 09/27/2019   Procedure: Patch Angioplasty Using Livia Snellen;  Surgeon: Nada Libman, MD;  Location: [facility] OR;  Service: Vascular;  Laterality: Left;   REPLACEMENT TOTAL KNEE Left  12/02/2015   TONSILLECTOMY  1963   removed at 77 years old     A IV Location/Drains/Wounds Patient Lines/Drains/Airways Status     Active Line/Drains/Airways     Name Placement date Placement time Site Days   Peripheral IV 08/20/23 20 G Left Antecubital 08/20/23  0659  Antecubital  less than 1            Intake/Output Last 24 hours No intake or output data in the 24 hours ending 08/20/23 1124  Labs/Imaging Results for orders placed or performed during the hospital encounter of 08/20/23 (from the past 48 hour(s))  CBC with Differential     Status: Abnormal   Collection Time: 08/20/23  6:34 AM  Result Value Ref Range   WBC 7.5 4.0 - 10.5 K/uL   RBC 5.01 3.87 - 5.11 MIL/uL   Hemoglobin 10.0 (L) 12.0 - 15.0 g/dL   HCT 78.2 (L) 95.6 - 21.3 %   MCV 66.7 (L) 80.0 - 100.0 fL   MCH 20.0 (L) 26.0 - 34.0 pg   MCHC 29.9 (L) 30.0 - 36.0 g/dL   RDW 08.6 (H) 57.8 - 46.9 %   Platelets 231 150 - 400 K/uL    Comment: REPEATED TO VERIFY   nRBC 0.0 0.0 - 0.2 %   Neutrophils Relative % 68 %   Neutro Abs 5.1 1.7 - 7.7 K/uL   Lymphocytes Relative 14 %   Lymphs Abs 1.0 0.7 - 4.0 K/uL   Monocytes Relative 15 %   Monocytes Absolute 1.1 (H) 0.1 - 1.0 K/uL   Eosinophils Relative 3 %   Eosinophils Absolute 0.2 0.0 - 0.5 K/uL   Basophils Relative 0 %   Basophils Absolute 0.0 0.0 - 0.1 K/uL   Immature Granulocytes 0 %   Abs Immature Granulocytes 0.02 0.00 - 0.07 K/uL    Comment: Performed at [facility], [address] [place], [place] [address]  Basic metabolic panel     Status: Abnormal   Collection Time: 08/20/23  6:34 AM  Result Value Ref Range   Sodium 137 135 - 145 mmol/L   Potassium 4.3 3.5 - 5.1 mmol/L   Chloride 104 98 - 111 mmol/L   CO2 26 22 - 32 mmol/L   Glucose, Bld 100 (H) 70 - 99 mg/dL    Comment: Glucose reference range applies only to samples taken after fasting for at least 8 hours.   BUN 16 8 - 23 mg/dL   Creatinine, Ser 8.41 0.44 - 1.00 mg/dL   Calcium 8.9  8.9 - 32.4 mg/dL   GFR, Estimated >40 >10 mL/min    Comment: (NOTE) Calculated using the CKD-EPI Creatinine Equation (2021)    Anion gap 7 5 - 15    Comment: Performed at [facility], [address], [place], [place] [address]  Protime-INR     Status: None   Collection Time: 08/20/23  6:34 AM  Result Value Ref Range   Prothrombin Time 14.5 11.4 - 15.2 seconds   INR 1.1 0.8 -  1.2    Comment: (NOTE) INR goal varies based on device and disease states. Performed at [facility], [address] [place], [place] [address]    No results found.  Pending Labs Unresulted Labs (From admission, onward)     Start     Ordered   08/20/23 1016  CBC  Once,   R        08/20/23 1015   08/20/23 1016  Comprehensive metabolic panel  Once,   R        08/20/23 1015   08/20/23 1016  Protime-INR  Once,   R        08/20/23 1015            Vitals/Pain Today's Vitals   08/20/23 0815 08/20/23 1030 08/20/23 1045 08/20/23 1107  BP: (!) 162/61 (!) 152/63 (!) 123/46   Pulse: 62 73 63   Resp: 16 14 12    Temp:  (!) 97.5 F (36.4 C)    TempSrc:      SpO2: 100% 100% 99%   Weight:      Height:      PainSc:    0-No pain    Isolation Precautions No active isolations  Medications Medications  ondansetron (ZOFRAN) injection 4 mg (has no administration in time range)  alum & mag hydroxide-simeth (MAALOX/MYLANTA) 200-200-20 MG/5ML suspension 15-30 mL (has no administration in time range)  pantoprazole (PROTONIX) EC tablet 40 mg (40 mg Oral Given 08/20/23 1105)  labetalol (NORMODYNE) injection 10 mg (has no administration in time range)  hydrALAZINE (APRESOLINE) injection 5 mg (has no administration in time range)  metoprolol tartrate (LOPRESSOR) injection 2-5 mg (has no administration in time range)  guaiFENesin-dextromethorphan (ROBITUSSIN DM) 100-10 MG/5ML syrup 15 mL (has no administration in time range)  acetaminophen (TYLENOL) tablet 325-650 mg (has no administration in time  range)    Or  acetaminophen (TYLENOL) suppository 325-650 mg (has no administration in time range)  oxyCODONE (Oxy IR/ROXICODONE) immediate release tablet 5-10 mg (has no administration in time range)  HYDROmorphone (DILAUDID) injection 0.5 mg (has no administration in time range)  polyethylene glycol (MIRALAX / GLYCOLAX) packet 17 g (has no administration in time range)  bisacodyl (DULCOLAX) suppository 10 mg (has no administration in time range)  0.9 %  sodium chloride infusion ( Intravenous New Bag/Given 08/20/23 1119)  aspirin EC tablet 81 mg (81 mg Oral Given 08/20/23 1015)  atorvastatin (LIPITOR) tablet 20 mg (20 mg Oral Given 08/20/23 1016)  dofetilide (TIKOSYN) capsule 500 mcg (500 mcg Oral Given 08/20/23 1025)  ferrous sulfate tablet 325 mg (325 mg Oral Given 08/20/23 1016)  losartan (COZAAR) tablet 100 mg (100 mg Oral Given 08/20/23 1014)  potassium chloride SA (KLOR-CON M) CR tablet 20-40 mEq (20 mEq Oral Given 08/20/23 1105)    Mobility walks     Focused Assessments    R Recommendations: See Admitting Provider Note  Report given to:   Additional Notes:

## 2023-08-21 ENCOUNTER — Observation Stay (HOSPITAL_COMMUNITY): Payer: Medicare Other

## 2023-08-21 DIAGNOSIS — M47812 Spondylosis without myelopathy or radiculopathy, cervical region: Secondary | ICD-10-CM | POA: Diagnosis not present

## 2023-08-21 DIAGNOSIS — M19012 Primary osteoarthritis, left shoulder: Secondary | ICD-10-CM | POA: Diagnosis not present

## 2023-08-21 DIAGNOSIS — I700 Atherosclerosis of aorta: Secondary | ICD-10-CM | POA: Diagnosis not present

## 2023-08-21 DIAGNOSIS — M79602 Pain in left arm: Secondary | ICD-10-CM | POA: Diagnosis not present

## 2023-08-21 DIAGNOSIS — M4312 Spondylolisthesis, cervical region: Secondary | ICD-10-CM | POA: Diagnosis not present

## 2023-08-21 MED ORDER — APIXABAN 5 MG PO TABS
5.0000 mg | ORAL_TABLET | Freq: Two times a day (BID) | ORAL | Status: DC
Start: 2023-08-21 — End: 2023-08-22
  Administered 2023-08-21 – 2023-08-22 (×3): 5 mg via ORAL
  Filled 2023-08-21 (×3): qty 1

## 2023-08-21 NOTE — Progress Notes (Signed)
Speech Language Pathology Treatment: Dysphagia  Patient Details Name: Kristina [name] MRN: [id number] DOB: [birth year] Today's Date: 08/21/2023 Time: 4098-1191 SLP Time Calculation (min) (ACUTE ONLY): 9 min  Assessment / Plan / Recommendation Clinical Impression  Pt seen for skilled SLP session to address dysphagia goals. She reported a marked, subjective improvement in her pharyngeal swallow function compared to yesterday, though stated she plans to continue with soft foods for the next few days. Per chart review and pt report, PO intake has ranged from 75-100% of meals. She remains afebrile and on room air. WBC is WNL. Pt accepted trials of regular solids and consumed with no overt or subtle s/s of aspiration or report of globus sensation.   Recommend continue current diet as tolerated.  Diet is modified to mechanical soft per pt's preference. Okay for medical team to advance pt's diet to regular consistency at her request.   No further acute SLP needs identified at this time. SLP will sign off. Please re-consult if pt exhibits concerns for aspiration with PO intake.    HPI HPI: Pt is a 77 y.o. female who presented to ED 11/15 with complaints of difficulty swallowing and enlarging R hematoma. Pt  underwent right carotid endarterectomy with bovine pericardial patch angioplasty for asymptomatic right carotid stenosis on 11/13. She was discharged 11/14 with mild hematoma. PMHx: CAD, HTN, afib, aortic stenosis, anxiety      SLP Plan  All goals met;Discharge SLP treatment due to (comment) (goals met)      Recommendations for follow up therapy are one component of a multi-disciplinary discharge planning process, led by the attending physician.  Recommendations may be updated based on patient status, additional functional criteria and insurance authorization.    Recommendations  Diet recommendations: Dysphagia 3 (mechanical soft);Thin liquid (Mechanical soft diet per pt's preference) Liquids  provided via: Cup;Straw Medication Administration: Whole meds with liquid Supervision: Patient able to self feed Compensations: Slow rate;Small sips/bites;Follow solids with liquid Postural Changes and/or Swallow Maneuvers: Seated upright 90 degrees                  Oral care BID;Patient independent with oral care   None Dysphagia, unspecified (R13.10)     All goals met;Discharge SLP treatment due to (comment) (goals met)     Kristina [name]  08/21/2023, 11:19 AM

## 2023-08-21 NOTE — Care Management Obs Status (Signed)
MEDICARE OBSERVATION STATUS NOTIFICATION   Patient Details  Name: Kristina [name] MRN: [id number] Date of Birth: 1945/11/18   Medicare Observation Status Notification Given:  Yes    Ronny Bacon, RN 08/21/2023, 7:09 AM

## 2023-08-21 NOTE — Progress Notes (Addendum)
Progress Note    08/21/2023 7:59 AM * No surgery found *  Subjective:  feels that her breathing and swallowing are getting better. Tolerating softs and fluids. Complaining of a lot of left arm pain and neck pain. Feels like she can't move her left arm due to discomfort. This started yesterday. No prior issues with her arm   Vitals:   08/21/23 0030 08/21/23 0357  BP: (!) 131/52 (!) 125/49  Pulse: 73 79  Resp: 16 12  Temp: 99.3 F (37.4 C) 98.2 F (36.8 C)  SpO2: 90% 92%   Physical Exam: Cardiac:  regular Lungs:  non labored Incisions:  right neck incision intact, swelling present, soft Extremities: limitation to abduction of left arm due to pain. Unable to hold arm up against gravity. No swelling. No tenderness to palpation. Moving all extremities otherwise without deficits Neurologic: alert and oriented. Speech coherent. Tongue midline  CBC    Component Value Date/Time   WBC 7.3 08/20/2023 1630   RBC 5.19 (H) 08/20/2023 1630   HGB 10.4 (L) 08/20/2023 1630   HGB 12.0 02/18/2023 1020   HCT 34.6 (L) 08/20/2023 1630   HCT 40.3 02/18/2023 1020   PLT 274 08/20/2023 1630   PLT 294 02/18/2023 1020   MCV 66.7 (L) 08/20/2023 1630   MCV 67 (L) 02/18/2023 1020   MCH 20.0 (L) 08/20/2023 1630   MCHC 30.1 08/20/2023 1630   RDW 16.1 (H) 08/20/2023 1630   RDW 18.1 (H) 02/18/2023 1020   LYMPHSABS 1.0 08/20/2023 0634   LYMPHSABS 1.2 01/27/2022 0937   MONOABS 1.1 (H) 08/20/2023 0634   EOSABS 0.2 08/20/2023 0634   EOSABS 0.3 01/27/2022 0937   BASOSABS 0.0 08/20/2023 0634   BASOSABS 0.0 01/27/2022 0937    BMET    Component Value Date/Time   NA 140 08/20/2023 1630   NA 142 03/26/2023 1538   K 4.2 08/20/2023 1630   CL 105 08/20/2023 1630   CO2 28 08/20/2023 1630   GLUCOSE 100 (H) 08/20/2023 1630   BUN 13 08/20/2023 1630   BUN 23 03/26/2023 1538   CREATININE 0.84 08/20/2023 1630   CALCIUM 9.0 08/20/2023 1630   GFRNONAA >60 08/20/2023 1630   GFRAA >60 09/28/2019 0500     INR    Component Value Date/Time   INR 1.2 08/20/2023 1630     Intake/Output Summary (Last 24 hours) at 08/21/2023 0759 Last data filed at 08/21/2023 0600 Gross per 24 hour  Intake 1288.45 ml  Output --  Net 1288.45 ml     Assessment/Plan:  77 y.o. female is s/p right CEA who returned due to increased swelling of neck with difficulty swallowing and breathing  Breathing and swallowing discomfort improving Right neck incision intact. Swelling present but soft Dysphagia 3 diet per SLP Hemodynamically stable Will restart Eliquis today and monitor  Having some LUE and neck pain. Will order xrays. Not sure why she is having this pain acutely Will as PT/OT to eval as well  Graceann Congress, PA-C [facility] [phone] 08/21/2023 7:59 AM  I have independently interviewed and examined patient and agree with PA assessment and plan above.  Overall progressing well from hematoma standpoint which remains soft in her right neck.  Restart Eliquis today and evaluate overnight possible for discharge tomorrow.  She is having left shoulder pain but x-ray is negative other than osteoarthritis.  Hopefully this is positional and will resolve with pain control.  Ishi Danser C. Randie Heinz, MD [facility] [place] Office: [phone]  Pager: [phone]

## 2023-08-22 NOTE — Discharge Instructions (Signed)
   [facility] [place]  Discharge Instructions   Carotid Surgery  Please refer to the following instructions for your post-procedure care. Your surgeon or physician assistant will discuss any changes with you.  Activity  You are encouraged to walk as much as you can. You can slowly return to normal activities but must avoid strenuous activity and heavy lifting until your doctor tell you it's okay. Avoid activities such as vacuuming or swinging a golf club. You can drive after one week if you are comfortable and you are no longer taking prescription pain medications. It is normal to feel tired for serval weeks after your surgery. It is also normal to have difficulty with sleep habits, eating, and bowel movements after surgery. These will go away with time.  Bathing/Showering  Shower daily after you go home. Do not soak in a bathtub, hot tub, or swim until the incision heals completely.  Incision Care  Shower every day. Clean your incision with mild soap and water. Pat the area dry with a clean towel. You do not need a bandage unless otherwise instructed. Do not apply any ointments or creams to your incision. You may have skin glue on your incision. Do not peel it off. It will come off on its own in about one week. Your incision may feel thickened and raised for several weeks after your surgery. This is normal and the skin will soften over time.   For Men Only: It's okay to shave around the incision but do not shave the incision itself for 2 weeks. It is common to have numbness under your chin that could last for several months.  Diet  Resume your normal diet. There are no special food restrictions following this procedure. A low fat/low cholesterol diet is recommended for all patients with vascular disease. In order to heal from your surgery, it is CRITICAL to get adequate nutrition. Your body requires vitamins, minerals, and protein. Vegetables are the best source of  vitamins and minerals. Vegetables also provide the perfect balance of protein. Processed food has little nutritional value, so try to avoid this.  Medications  Resume taking all of your medications unless your doctor or physician assistant tells you not to. If your incision is causing pain, you may take over-the- counter pain relievers such as acetaminophen (Tylenol). If you were prescribed a stronger pain medication, please be aware these medications can cause nausea and constipation. Prevent nausea by taking the medication with a snack or meal. Avoid constipation by drinking plenty of fluids and eating foods with a high amount of fiber, such as fruits, vegetables, and grains.   Do not take Tylenol if you are taking prescription pain medications.  Follow Up  Our office will schedule a follow up appointment 2-3 weeks following discharge.  Please call us immediately for any of the following conditions  . Increased pain, redness, drainage (pus) from your incision site. . Fever of 101 degrees or higher. . If you should develop stroke (slurred speech, difficulty swallowing, weakness on one side of your body, loss of vision) you should call 911 and go to the nearest emergency room. .  Reduce your risk of vascular disease:  . Stop smoking. If you would like help call [facility] at 1-800-QUIT-NOW (1-800-784-8669) or [place] at 336-586-4000. . Manage your cholesterol . Maintain a desired weight . Control your diabetes . Keep your blood pressure down .  If you have any questions, please call the office at [phone]. 

## 2023-08-22 NOTE — Progress Notes (Signed)
OT Cancellation Note  Patient Details Name: Kristina [name] MRN: [id number] DOB: 03-22-1946   Cancelled Treatment:    Reason Eval/Treat Not Completed: OT screened, no needs identified, will sign off Per PT, pt moving well and no formal OT eval needed at this time.  Lorre Munroe 08/22/2023, 9:17 AM

## 2023-08-22 NOTE — Evaluation (Signed)
Physical Therapy Brief Evaluation and Discharge Note Patient Details Name: Kristina [name] MRN: [id number] DOB: September 27, 1946 Today's Date: 08/22/2023   History of Present Illness  77 y.o. female admitted 11/15 s/p right CEA who returned due to increased swelling of neck with hematoma with difficulty swallowing and breathing.  Treated medically and plan is to go home today.PMH: afib  Clinical Impression  Pt admitted with above diagnosis.  Pt currently without functional limitations and is Modif I to I with all mobility. Doesn't need skilled PT. Will sign off.   PT Assessment All further PT needs can be met in the next venue of care  Assistance Needed at Discharge  PRN    Equipment Recommendations None recommended by PT  Recommendations for Other Services       Precautions/Restrictions Precautions Precautions: None Restrictions Weight Bearing Restrictions: No        Mobility  Bed Mobility Rolling: Independent Supine/Sidelying to sit: Independent      Transfers Overall transfer level: Independent                      Ambulation/Gait Ambulation/Gait assistance: Modified independent (Device/Increase time) Gait Distance (Feet): 150 Feet Assistive device: Rolling walker (2 wheels) Gait Pattern/deviations: WFL(Within Functional Limits) Gait Speed: Pace WFL General Gait Details: Pt did well with progression of ambulation.  No LOB with challenges.  Home Activity Instructions    Stairs            Modified Rankin (Stroke Patients Only)        Balance Overall balance assessment: Needs assistance Sitting-balance support: No upper extremity supported, Feet supported Sitting balance-Leahy Scale: Fair     Standing balance support: Bilateral upper extremity supported, During functional activity Standing balance-Leahy Scale: Poor Standing balance comment: relies on UE support          Pertinent Vitals/Pain PT - Brief Vital Signs All Vital Signs  Stable: Yes (84-100 bpm) Pain Assessment Pain Assessment: No/denies pain     Home Living Family/patient expects to be discharged to:: Private residence Living Arrangements: Alone;Non-relatives/Friends (Best friend here and has neighbors) Available Help at Discharge: Friend(s);Available PRN/intermittently Home Environment: Stairs to enter  [employer] of Steps: 1 Home Equipment: Rollator (4 wheels)        Prior Function Level of Independence: Independent Comments: used rollator when needed at home and was independent with B/D, cooking and cleaning    UE/LE Assessment   UE ROM/Strength/Tone/Coordination: WFL    LE ROM/Strength/Tone/Coordination: Generalized weakness      Communication   Communication Communication: No apparent difficulties     Cognition Overall Cognitive Status: Appears within functional limits for tasks assessed/performed       General Comments General comments (skin integrity, edema, etc.): 84bpm    Exercises     Assessment/Plan    PT Problem List Decreased activity tolerance;Decreased balance;Decreased mobility       PT Visit Diagnosis Muscle weakness (generalized) (M62.81)    No Skilled PT     Co-evaluation                AMPAC 6 Clicks Help needed turning from your back to your side while in a flat bed without using bedrails?: None Help needed moving from lying on your back to sitting on the side of a flat bed without using bedrails?: None Help needed moving to and from a bed to a chair (including a wheelchair)?: None Help needed standing up from a chair using your arms (e.g., wheelchair or bedside  chair)?: None Help needed to walk in hospital room?: None Help needed climbing 3-5 steps with a railing? : None 6 Click Score: 24      End of Session Equipment Utilized During Treatment: Gait belt Activity Tolerance: Patient tolerated treatment well Patient left: in chair;with call bell/phone within reach Nurse Communication:  Mobility status PT Visit Diagnosis: Muscle weakness (generalized) (M62.81)     Time: 5409-8119 PT Time Calculation (min) (ACUTE ONLY): 18 min  Charges:   PT Evaluation $PT Eval Low Complexity: 1 Low      Tauren Delbuono M,PT Acute Rehab Services [phone]   Bevelyn Buckles  08/22/2023, 9:50 AM

## 2023-08-22 NOTE — Progress Notes (Addendum)
Progress Note    08/22/2023 8:09 AM * No surgery found *  Subjective:  feeling better this morning. Not having any difficulty swallowing or breathing. Says she is tolerating soft diet and plans to continue this at home. Left neck/shoulder better. Feels likely just positional. Feels good about going home today   Vitals:   08/22/23 0405 08/22/23 0801  BP: (!) 153/54 (!) 143/60  Pulse: 71 72  Resp: 12 18  Temp: 98.7 F (37.1 C) 98.4 F (36.9 C)  SpO2: 97%    Physical Exam: Cardiac:  regular Lungs:  non labored Incisions:  right neck incision is clean, dry and intact. Ecchymosis present. Neck with swelling but soft, remains stable Extremities:  moving extremities without deficits. A little discomfort with full ROM of left upper extremity Abdomen:  soft Neurologic: alert and oriented. Speech coherent  CBC    Component Value Date/Time   WBC 7.3 08/20/2023 1630   RBC 5.19 (H) 08/20/2023 1630   HGB 10.4 (L) 08/20/2023 1630   HGB 12.0 02/18/2023 1020   HCT 34.6 (L) 08/20/2023 1630   HCT 40.3 02/18/2023 1020   PLT 274 08/20/2023 1630   PLT 294 02/18/2023 1020   MCV 66.7 (L) 08/20/2023 1630   MCV 67 (L) 02/18/2023 1020   MCH 20.0 (L) 08/20/2023 1630   MCHC 30.1 08/20/2023 1630   RDW 16.1 (H) 08/20/2023 1630   RDW 18.1 (H) 02/18/2023 1020   LYMPHSABS 1.0 08/20/2023 0634   LYMPHSABS 1.2 01/27/2022 0937   MONOABS 1.1 (H) 08/20/2023 0634   EOSABS 0.2 08/20/2023 0634   EOSABS 0.3 01/27/2022 0937   BASOSABS 0.0 08/20/2023 0634   BASOSABS 0.0 01/27/2022 0937    BMET    Component Value Date/Time   NA 140 08/20/2023 1630   NA 142 03/26/2023 1538   K 4.2 08/20/2023 1630   CL 105 08/20/2023 1630   CO2 28 08/20/2023 1630   GLUCOSE 100 (H) 08/20/2023 1630   BUN 13 08/20/2023 1630   BUN 23 03/26/2023 1538   CREATININE 0.84 08/20/2023 1630   CALCIUM 9.0 08/20/2023 1630   GFRNONAA >60 08/20/2023 1630   GFRAA >60 09/28/2019 0500    INR    Component Value Date/Time    INR 1.2 08/20/2023 1630     Intake/Output Summary (Last 24 hours) at 08/22/2023 0809 Last data filed at 08/21/2023 1848 Gross per 24 hour  Intake 240 ml  Output --  Net 240 ml     Assessment/Plan:  77 y.o. female is s/p R CEA who returned due to increased swelling of neck with difficulty swallowing and breathing   Resolution of trouble swallowing and breathing Neck incision intact, ecchymosis present as expected. Swelling present but all soft. No expansion Eliquis started yesterday. No evidence of any bleeding issues Soft diet. Patient is okay with this and feels very comfortable going home on softs Left shoulder and neck pain improving. Suspect this was just positional. Has OA She will continue Eliquis, Aspirin and Statin She is stable for discharge home today She already has follow up arranged on 09/06/23   Graceann Congress, PA-C [facility] [phone] 08/22/2023 8:09 AM  I have independently interviewed and examined patient and agree with PA assessment and plan above.  Eliquis was restarted there is no evidence of ongoing hematoma she is swallowing okay and shoulder pain has resolved.  Okay for discharge today and will follow-up as previously scheduled.  Ryla Cauthon C. Randie Heinz, MD [facility] Office:  [phone] Pager: [phone]

## 2023-08-22 NOTE — Discharge Summary (Signed)
Carotid Discharge Summary     Kristina [name] [birth year] 77 y.o. female  [id number]  Admission Date: 08/18/2023  Discharge Date: 08/19/2023  Physician: Venida Jarvis, MD  Admission Diagnosis: Status post carotid endarterectomy [Z98.890] Carotid stenosis, asymptomatic [facility]   Hospital Course:  The patient was admitted to the hospital and taken to the operating room on 08/18/2023 and underwent Right carotid endarterectomy.  The pt tolerated the procedure well and was transported to the PACU in good condition.  By POD 1, the pt neuro status remained intact. She did have some mild neck swelling post operatively but this remained soft. No difficulty swallowing, talking or breathing. Tolerating diet. Ambulating without issues. Hemodynamically stable.   She remained stable for discharge home. She was given instructions about resuming her Eliquis POD#2. PDMP was reviewed and post operative pain medication was sent to her pharmacy. Prescription for Aspirin 81 mg also sent to her pharmacy. She will follow up in 2-3 weeks for incision check.    Recent Labs    08/20/23 0634 08/20/23 1630  NA 137 140  K 4.3 4.2  CL 104 105  CO2 26 28  GLUCOSE 100* 100*  BUN 16 13  CALCIUM 8.9 9.0   Recent Labs    08/20/23 0634 08/20/23 1630  WBC 7.5 7.3  HGB 10.0* 10.4*  HCT 33.4* 34.6*  PLT 231 274   Recent Labs    08/20/23 0634 08/20/23 1630  INR 1.1 1.2     Discharge Instructions     CAROTID Sugery: Call MD for difficulty swallowing or speaking; weakness in arms or legs that is a new symtom; severe headache.  If you have increased swelling in the neck and/or  are having difficulty breathing, CALL 911   Complete by: As directed    Call MD for:  redness, tenderness, or signs of infection (pain, swelling, bleeding, redness, odor or green/yellow discharge around incision site)   Complete by: As directed    Call MD for:  severe or increased pain, loss or decreased feeling   in affected limb(s)   Complete by: As directed    Call MD for:  temperature >100.5   Complete by: As directed    Discharge instructions   Complete by: As directed    You may restart your Eliquis on 08/20/23   Discharge wound care:   Complete by: As directed    Keep incision dry for 24 hours. You can then wash with mild soap and water, pat dry. Do not soak in bathtub   Driving Restrictions   Complete by: As directed    No driving for 2 weeks or while taking narcotic pain medication   Increase activity slowly   Complete by: As directed    Walk with assistance use walker or cane as needed   Lifting restrictions   Complete by: As directed    No lifting for 4 weeks   Resume previous diet   Complete by: As directed        Discharge Diagnosis:  Status post carotid endarterectomy [Z98.890] Carotid stenosis, asymptomatic [I65.29]  Secondary Diagnosis: Patient Active Problem List   Diagnosis Date Noted   Hematoma of neck 08/20/2023   Status post carotid endarterectomy 08/18/2023   Carotid stenosis, asymptomatic 08/18/2023   Persistent atrial fibrillation (HCC) 03/09/2023   Hypercoagulable state due to persistent atrial fibrillation (HCC) 03/09/2023   Postmenopausal bleeding 04/15/2022   Endometrial polyp 04/15/2022   Prediabetes 10/27/2021   Anxiety 01/10/2021   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  Degenerative joint disease involving multiple joints 01/10/2021   Other allergy status, other than to drugs and biological substances 01/10/2021   Personal history of transient ischemic attack (TIA), and cerebral infarction without residual deficits 01/10/2021   Pure hypercholesterolemia 01/10/2021   Unspecified urinary incontinence 01/10/2021   Visual loss 01/10/2021   Vitamin D deficiency, unspecified 01/10/2021   Aortic stenosis 12/22/2019   Left carotid artery stenosis 09/27/2019   Carotid artery disease (HCC) 09/22/2019   Class 3 severe obesity with serious comorbidity and  body mass index (BMI) of 40.0 to 44.9 in adult [facility]) 12/15/2018   Chronic back pain 11/21/2018   Hyperlipidemia 02/24/2017   Essential hypertension 02/24/2017   Past Medical History:  Diagnosis Date   Allergic rhinitis    Anemia glaucoma   Anxiety    Atrial fibrillation (HCC)    Back pain    Carotid arterial disease (HCC)    Cataract    Mixed OU   DDD (degenerative disc disease), lumbar    Dysrhythmia    A. Fib   Family history of adverse reaction to anesthesia    daughter has PONV   Heart murmur    ECHO 10/23/22   High blood pressure    High cholesterol    History of stomach ulcers    Hypertensive retinopathy    OU   Peripheral vascular disease (HCC)    Rheumatic fever    Rheumatic fever    Rheumatoid arteritis (HCC)     Allergies as of 08/19/2023       Reactions   Hydrochlorothiazide Other (See Comments)   Should not be used in patients on Tikosyn.    Penicillins Anaphylaxis, Itching   Did it involve swelling of the face/tongue/throat, SOB, or low BP? Yes Did it involve sudden or severe rash/hives, skin peeling, or any reaction on the inside of your mouth or nose? No Did you need to seek medical attention at a hospital or doctor's office? Yes When did it last happen?      Childhood allergy If all above answers are "NO", may proceed with cephalosporin use.   Mite (d. Farinae) Swelling   Other reaction(s): sinus swelling (dust mites)        Medication List     STOP taking these medications    hydrochlorothiazide 25 MG tablet Commonly known as: HYDRODIURIL       TAKE these medications    AIRBORNE GUMMIES PO Take 3 tablets by mouth in the morning. immune system   apixaban 5 MG Tabs tablet Commonly known as: Eliquis Take 1 tablet (5 mg total) by mouth 2 (two) times daily. Pt. Was given samples from her PCP enough to last one month   aspirin EC 81 MG tablet Take 1 tablet (81 mg total) by mouth daily at 6 (six) AM. Swallow whole.   atorvastatin 20 MG  tablet Commonly known as: LIPITOR Take 20 mg by mouth every morning.   cyanocobalamin 1000 MCG tablet Commonly known as: VITAMIN B12 Take 1,000 mcg by mouth in the morning.   dofetilide 500 MCG capsule Commonly known as: TIKOSYN Take 1 capsule (500 mcg total) by mouth 2 (two) times daily.   dorzolamide-timolol 2-0.5 % ophthalmic solution Commonly known as: COSOPT Place 1 drop into both eyes 2 (two) times daily.   ELDERBERRY PO Take 2 tablets by mouth in the morning. Airborne Immune gummy   ferrous sulfate 325 (65 FE) MG EC tablet Take 325 mg by mouth in the morning.   GLUCOSAMINE-CHONDROITIN  PO Take 1 tablet by mouth daily.   losartan 100 MG tablet Commonly known as: COZAAR Take 100 mg by mouth in the morning.   multivitamin capsule Take 1 capsule by mouth daily. Vita fusion   oxyCODONE 5 MG immediate release tablet Commonly known as: Oxy IR/ROXICODONE Take 1 tablet (5 mg total) by mouth every 6 (six) hours as needed for moderate pain (pain score 4-6).   Vitamin D (Ergocalciferol) 1.25 MG (50000 UNIT) Caps capsule Commonly known as: DRISDOL Take 1 capsule (50,000 Units total) by mouth every 7 (seven) days. What changed: when to take this               Discharge Care Instructions  (From admission, onward)           Start     Ordered   08/19/23 0000  Discharge wound care:       Comments: Keep incision dry for 24 hours. You can then wash with mild soap and water, pat dry. Do not soak in bathtub   08/19/23 0826             Discharge Instructions:   [facility] Discharge Instructions Carotid Endarterectomy (CEA)  Please refer to the following instructions for your post-procedure care. Your surgeon or physician assistant will discuss any changes with you.  Activity  You are encouraged to walk as much as you can. You can slowly return to normal activities but must avoid strenuous activity and heavy lifting until  your doctor tell you it's OK. Avoid activities such as vacuuming or swinging a golf club. You can drive after one week if you are comfortable and you are no longer taking prescription pain medications. It is normal to feel tired for serval weeks after your surgery. It is also normal to have difficulty with sleep habits, eating, and bowel movements after surgery. These will go away with time.  Bathing/Showering  You may shower after you come home. Do not soak in a bathtub, hot tub, or swim until the incision heals completely.  Incision Care  Shower every day. Clean your incision with mild soap and water. Pat the area dry with a clean towel. You do not need a bandage unless otherwise instructed. Do not apply any ointments or creams to your incision. You may have skin glue on your incision. Do not peel it off. It will come off on its own in about one week. Your incision may feel thickened and raised for several weeks after your surgery. This is normal and the skin will soften over time. For Men Only: It's OK to shave around the incision but do not shave the incision itself for 2 weeks. It is common to have numbness under your chin that could last for several months.  Diet  Resume your normal diet. There are no special food restrictions following this procedure. A low fat/low cholesterol diet is recommended for all patients with vascular disease. In order to heal from your surgery, it is CRITICAL to get adequate nutrition. Your body requires vitamins, minerals, and protein. Vegetables are the best source of vitamins and minerals. Vegetables also provide the perfect balance of protein. Processed food has little nutritional value, so try to avoid this.  Medications  Resume taking all of your medications unless your doctor or physician assistant tells you not to.  If your incision is causing pain, you may take over-the- counter pain relievers such as acetaminophen (Tylenol). If you were prescribed a  stronger  pain medication, please be aware these medications can cause nausea and constipation.  Prevent nausea by taking the medication with a snack or meal. Avoid constipation by drinking plenty of fluids and eating foods with a high amount of fiber, such as fruits, vegetables, and grains. Do not take Tylenol if you are taking prescription pain medications.  Follow Up  Our office will schedule a follow up appointment 2-3 weeks following discharge.  Please call us immediately for any of the following conditions  Increased pain, redness, drainage (pus) from your incision site. Fever of 101 degrees or higher. If you should develop stroke (slurred speech, difficulty swallowing, weakness on one side of your body, loss of vision) you should call 911 and go to the nearest emergency room.  Reduce your risk of vascular disease:  Stop smoking. If you would like help call [facility] at 1-800-QUIT-NOW (573-691-6693) or [place] at (209) 861-4119. Manage your cholesterol Maintain a desired weight Control your diabetes Keep your blood pressure down  If you have any questions, please call the office at [phone].  Prescriptions given: Oxycodone 5 mg #12 No Refill  Disposition: Home  Patient's condition: is Good  Follow up: 1. Dr. Myra Gianotti  in 2 -3 weeks.   Amadu Schlageter PA-C [facility] [phone]   --- For [facility] Registry use ---   Modified Rankin score at D/C (0-6): 0  IV medication needed for:  1. Hypertension: No 2. Hypotension: No  Post-op Complications: No  1. Post-op CVA or TIA: No  If yes: Event classification (right eye, left eye, right cortical, left cortical, verterobasilar, other): n/a  If yes: Timing of event (intra-op, <6 hrs post-op, >=6 hrs post-op, unknown): n/a  2. CN injury: No  If yes: CN not injuried   3. Myocardial infarction: No  If yes: Dx by (EKG or clinical, Troponin): n/a  4.  CHF: No  5.  Dysrhythmia (new): No  6.  Wound infection: No  7. Reperfusion symptoms: No  8. Return to OR: No  If yes: return to OR for (bleeding, neurologic, other CEA incision, other): n/a  Discharge medications: Statin use:  Yes ASA use:  Yes   Beta blocker use:  No ACE-Inhibitor use:  No  ARB use:  Yes CCB use: No P2Y12 Antagonist use: No, [ ]  Plavix, [ ]  Plasugrel, [ ]  Ticlopinine, [ ]  Ticagrelor, [ ]  Other, [ ]  No for medical reason, [ ]  Non-compliant, [ ]  Not-indicated Anti-coagulant use:  Yes, [ ]  Warfarin, [ X] Rivaroxaban, [ ]  Dabigatran,

## 2023-08-23 LAB — POCT ACTIVATED CLOTTING TIME
Activated Clotting Time: 279 s
Activated Clotting Time: 245 s

## 2023-08-23 NOTE — Discharge Summary (Signed)
Carotid Discharge Summary     Kristina [name] 15-Oct-1945 77 y.o. female  [id number]  Admission Date: 08/20/2023  Discharge Date: 08/22/2023  Physician: Lemar Livings MD  Admission Diagnosis: Postoperative pain [G89.18] Hematoma of neck [S10.93XA]  Hospital Course:  [name] was re admitted s/p right carotid endarterectomy on 08/18/2023 by Dr. Myra Gianotti with increased neck swelling, increased pain, and difficulty swallowing. She was discharged with a very small, soft hematoma but returned with increased concern of expansion. We recommended that she be admitted for overnight observation. She had restarted her home Eliquis as advised. We placed this on hold for her admission. SLP was consulted for evaluation of her swallowing. Their recommendation was for dysphagia 3 diet ( Mechanical soft) with thin liquids. She was able to tolerate soft diet well. Her symptoms improved overnight. Her incision in her neck remained intact with soft hematoma present. She did wake with increased left neck and left shoulder as well as arm pain and weakness. Unsure of the cause of her acute pain we did obtain an x ray of her neck and shoulder. This showed Osteoarthritis but no acute findings. The remainder of her hospital stay consisted of increased PO intake of soft foods, restarting of her Eliquis without any further bleeding concerns, improvement of her neck and arm pain, increased mobilization. PT/OT was consulted for evaluation and she was cleared for discharge home. She remained stable for discharge home on 08/22/23. She will resume her home medications. She will continue Eliquis, Aspirin, and Statin. She has follow up arranged on 09/06/23.    Recent Labs    08/20/23 1630  NA 140  K 4.2  CL 105  CO2 28  GLUCOSE 100*  BUN 13  CALCIUM 9.0   Recent Labs    08/20/23 1630  WBC 7.3  HGB 10.4*  HCT 34.6*  PLT 274   Recent Labs    08/20/23 1630  INR 1.2     Discharge Instructions      CAROTID Sugery: Call MD for difficulty swallowing or speaking; weakness in arms or legs that is a new symtom; severe headache.  If you have increased swelling in the neck and/or  are having difficulty breathing, CALL 911   Complete by: As directed    Call MD for:  redness, tenderness, or signs of infection (pain, swelling, bleeding, redness, odor or green/yellow discharge around incision site)   Complete by: As directed    Call MD for:  severe or increased pain, loss or decreased feeling  in affected limb(s)   Complete by: As directed    Call MD for:  temperature >100.5   Complete by: As directed    Driving Restrictions   Complete by: As directed    No driving while taking narcotic pain medication   Increase activity slowly   Complete by: As directed    Walk with assistance use walker or cane as needed   Lifting restrictions   Complete by: As directed    No lifting for 4 weeks   May shower    Complete by: As directed    Resume previous diet   Complete by: As directed    may wash over wound with mild soap and water   Complete by: As directed        Discharge Diagnosis:  Postoperative pain [G89.18] Hematoma of neck [S10.93XA]  Secondary Diagnosis: Patient Active Problem List   Diagnosis Date Noted   Hematoma of neck 08/20/2023   Status post carotid endarterectomy 08/18/2023  Carotid stenosis, asymptomatic 08/18/2023   Persistent atrial fibrillation (HCC) 03/09/2023   Hypercoagulable state due to persistent atrial fibrillation (HCC) 03/09/2023   Postmenopausal bleeding 04/15/2022   Endometrial polyp 04/15/2022   Prediabetes 10/27/2021   Anxiety 01/10/2021   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   Degenerative joint disease involving multiple joints 01/10/2021   Other allergy status, other than to drugs and biological substances 01/10/2021   Personal history of transient ischemic attack (TIA), and cerebral infarction without residual deficits 01/10/2021   Pure  hypercholesterolemia 01/10/2021   Unspecified urinary incontinence 01/10/2021   Visual loss 01/10/2021   Vitamin D deficiency, unspecified 01/10/2021   Aortic stenosis 12/22/2019   Left carotid artery stenosis 09/27/2019   Carotid artery disease (HCC) 09/22/2019   Class 3 severe obesity with serious comorbidity and body mass index (BMI) of 40.0 to 44.9 in adult [facility]) 12/15/2018   Chronic back pain 11/21/2018   Hyperlipidemia 02/24/2017   Essential hypertension 02/24/2017   Past Medical History:  Diagnosis Date   Allergic rhinitis    Anemia glaucoma   Anxiety    Atrial fibrillation (HCC)    Back pain    Carotid arterial disease (HCC)    Cataract    Mixed OU   DDD (degenerative disc disease), lumbar    Dysrhythmia    A. Fib   Family history of adverse reaction to anesthesia    daughter has PONV   Heart murmur    ECHO 10/23/22   High blood pressure    High cholesterol    History of stomach ulcers    Hypertensive retinopathy    OU   Peripheral vascular disease (HCC)    Rheumatic fever    Rheumatic fever    Rheumatoid arteritis (HCC)     Allergies as of 08/22/2023       Reactions   Hydrochlorothiazide Other (See Comments)   Should not be used in patients on Tikosyn.    Penicillins Anaphylaxis, Itching   Did it involve swelling of the face/tongue/throat, SOB, or low BP? Yes Did it involve sudden or severe rash/hives, skin peeling, or any reaction on the inside of your mouth or nose? No Did you need to seek medical attention at a hospital or doctor's office? Yes When did it last happen?      Childhood allergy If all above answers are "NO", may proceed with cephalosporin use.   Mite (d. Farinae) Swelling   Other reaction(s): sinus swelling (dust mites)        Medication List     TAKE these medications    AIRBORNE GUMMIES PO Take 3 tablets by mouth in the morning. immune system   apixaban 5 MG Tabs tablet Commonly known as: Eliquis Take 1 tablet (5 mg total)  by mouth 2 (two) times daily. Pt. Was given samples from her PCP enough to last one month   aspirin EC 81 MG tablet Take 1 tablet (81 mg total) by mouth daily at 6 (six) AM. Swallow whole.   atorvastatin 20 MG tablet Commonly known as: LIPITOR Take 20 mg by mouth every morning.   cyanocobalamin 1000 MCG tablet Commonly known as: VITAMIN B12 Take 1,000 mcg by mouth in the morning.   dofetilide 500 MCG capsule Commonly known as: TIKOSYN Take 1 capsule (500 mcg total) by mouth 2 (two) times daily.   dorzolamide-timolol 2-0.5 % ophthalmic solution Commonly known as: COSOPT Place 1 drop into both eyes 2 (two) times daily.   ELDERBERRY PO Take 2 tablets by mouth  in the morning. Airborne Immune gummy   ferrous sulfate 325 (65 FE) MG EC tablet Take 325 mg by mouth in the morning.   GLUCOSAMINE-CHONDROITIN PO Take 1 tablet by mouth daily.   losartan 100 MG tablet Commonly known as: COZAAR Take 100 mg by mouth in the morning.   multivitamin capsule Take 1 capsule by mouth daily. Vita fusion   oxyCODONE 5 MG immediate release tablet Commonly known as: Oxy IR/ROXICODONE Take 1 tablet (5 mg total) by mouth every 6 (six) hours as needed for moderate pain (pain score 4-6).   Vitamin D (Ergocalciferol) 1.25 MG (50000 UNIT) Caps capsule Commonly known as: DRISDOL Take 1 capsule (50,000 Units total) by mouth every 7 (seven) days. What changed: when to take this         Discharge Instructions:  [facility] Discharge Instructions Carotid Endarterectomy (CEA)  Please refer to the following instructions for your post-procedure care. Your surgeon or physician assistant will discuss any changes with you.  Activity  You are encouraged to walk as much as you can. You can slowly return to normal activities but must avoid strenuous activity and heavy lifting until your doctor tell you it's OK. Avoid activities such as vacuuming or swinging a golf club.  You can drive after one week if you are comfortable and you are no longer taking prescription pain medications. It is normal to feel tired for serval weeks after your surgery. It is also normal to have difficulty with sleep habits, eating, and bowel movements after surgery. These will go away with time.  Bathing/Showering  You may shower after you come home. Do not soak in a bathtub, hot tub, or swim until the incision heals completely.  Incision Care  Shower every day. Clean your incision with mild soap and water. Pat the area dry with a clean towel. You do not need a bandage unless otherwise instructed. Do not apply any ointments or creams to your incision. You may have skin glue on your incision. Do not peel it off. It will come off on its own in about one week. Your incision may feel thickened and raised for several weeks after your surgery. This is normal and the skin will soften over time. For Men Only: It's OK to shave around the incision but do not shave the incision itself for 2 weeks. It is common to have numbness under your chin that could last for several months.  Diet  Resume your normal diet. There are no special food restrictions following this procedure. A low fat/low cholesterol diet is recommended for all patients with vascular disease. In order to heal from your surgery, it is CRITICAL to get adequate nutrition. Your body requires vitamins, minerals, and protein. Vegetables are the best source of vitamins and minerals. Vegetables also provide the perfect balance of protein. Processed food has little nutritional value, so try to avoid this.  Medications  Resume taking all of your medications unless your doctor or physician assistant tells you not to.  If your incision is causing pain, you may take over-the- counter pain relievers such as acetaminophen (Tylenol). If you were prescribed a stronger pain medication, please be aware these medications can cause nausea and constipation.   Prevent nausea by taking the medication with a snack or meal. Avoid constipation by drinking plenty of fluids and eating foods with a high amount of fiber, such as fruits, vegetables, and grains. Do not take Tylenol if you are taking prescription pain  medications.  Follow Up  Our office will schedule a follow up appointment 2-3 weeks following discharge.  Please call us immediately for any of the following conditions  Increased pain, redness, drainage (pus) from your incision site. Fever of 101 degrees or higher. If you should develop stroke (slurred speech, difficulty swallowing, weakness on one side of your body, loss of vision) you should call 911 and go to the nearest emergency room.  Reduce your risk of vascular disease:  Stop smoking. If you would like help call [facility] at 1-800-QUIT-NOW (2562042975) or [place] at (340) 744-4808. Manage your cholesterol Maintain a desired weight Control your diabetes Keep your blood pressure down  If you have any questions, please call the office at [phone].  Prescriptions given: None  Disposition: Home  Patient's condition: is Good  Follow up: 1. Dr. Myra Gianotti in 2-3 weeks.   Loella Hickle PA-C [facility] [phone]   --- For [facility] Registry use ---   Modified Rankin score at D/C (0-6): 0  IV medication needed for:  1. Hypertension: No 2. Hypotension: No  Post-op Complications: No  1. Post-op CVA or TIA: No  If yes: Event classification (right eye, left eye, right cortical, left cortical, verterobasilar, other): n/a  If yes: Timing of event (intra-op, <6 hrs post-op, >=6 hrs post-op, unknown): n/a  2. CN injury: No  If yes: CN not injuried   3. Myocardial infarction: No  If yes: Dx by (EKG or clinical, Troponin): n/a  4.  CHF: No  5.  Dysrhythmia (new): No  6. Wound infection: No  7. Reperfusion symptoms: No  8. Return to OR: No  If yes: return to OR for (bleeding,  neurologic, other CEA incision, other): n/a  Discharge medications: Statin use:  Yes ASA use:  Yes   Beta blocker use:  No ACE-Inhibitor use:  No  ARB use:  Yes CCB use: No P2Y12 Antagonist use: No, [ ]  Plavix, [ ]  Plasugrel, [ ]  Ticlopinine, [ ]  Ticagrelor, [ ]  Other, [ ]  No for medical reason, [ ]  Non-compliant, [ ]  Not-indicated Anti-coagulant use:  Yes, [ ]  Warfarin, Arly.Keller ] Rivaroxaban, [ ]  Dabigatran, [X] Apixaban

## 2023-08-24 ENCOUNTER — Telehealth: Payer: Self-pay | Admitting: Cardiovascular Disease

## 2023-08-24 ENCOUNTER — Telehealth: Payer: Self-pay

## 2023-08-24 NOTE — Telephone Encounter (Signed)
Pt called to ask about staying on baby Aspirin. Per APP, she could not take Plavix so she is to continue Aspirin. Pt also c/o BLE swelling and took a Lasix  this morning, that she had from a previous hospitalization/ I have advised her to let Dr. Allyson Sabal know, as he may want to increase this dosage or have her stay on it etc. All questions/concerns answered.

## 2023-08-24 NOTE — Telephone Encounter (Signed)
Pt c/o swelling: STAT is pt has developed SOB within 24 hours  How much weight have you gained and in what time span? Pt unsure   If swelling, where is the swelling located? Feet and legs   Are you currently taking a fluid pill? yes  Are you currently SOB? No  Do you have a log of your daily weights (if so, list)? No  Have you gained 3 pounds in a day or 5 pounds in a week? yes  Have you traveled recently? No

## 2023-08-24 NOTE — Telephone Encounter (Signed)
Spoke to patient who is having some edema BLE up to her thighs. The swelling started since she was discharged from a hospital admit. She stated she was given a large amount of fluids while there. She reported taking Lasix last night and this morning. She deny any other symptoms at this time. Lasix not seen on medication list but she stated she had them from when she was discharged from the hospital. She sated they have helped with the edema and her legs are not as hard and tight. Her BP have been: 11/18 143/53  72           178/75  73  11/19  155/59  73            151/66 67

## 2023-08-24 NOTE — Telephone Encounter (Signed)
Runell Gess, MD  Cv Div Nl [phone] minutes ago (4:19 PM)    Prob should be seen by an APP   Patient identification verified by 2 forms. Marilynn Rail, RN    Called and spoke to patient  Patient states:   -legs are hard and swollen   -takes 40mg  lasix daily  Patient denies:    -SOB/difficulty breathing  Patient scheduled for OV 11/22 Reviewed ED warning signs/precautions  Patient verbalized understanding, no questions at this time

## 2023-08-25 NOTE — Progress Notes (Signed)
Cardiology Office Note:  .   Date:  08/27/2023  ID:  Mathews Argyle, DOB [birth year], MRN [id number] PCP: Sigmund Hazel, MD  [facility] Providers Cardiologist:  Nanetta Batty, MD Electrophysiologist:  Maurice Small, MD }   History of Present Illness: Kristina [name] is a 77 y.o. female history of paroxysmal atrial fibrillation, aortic stenosis, carotid artery disease, hypertension, and hyperlipidemia. Was admitted by EP on 06/28/2023 for Tikosyn loading, The patient converted chemically and did not require cardioversion. The patient had significant post conversion pause of 9 seconds resulting in syncope and subsequent sinus bradycardia in the 40s. This resolved with holding her BB   She was recently admitted status post right carotid endarterectomy on 08/18/2023 by Dr. Myra Gianotti.  The patient was seen again on 11/15 2020 for for increased pain and swelling in her neck and was found to have a very small soft hematoma which was concerning for expansion.  She had restarted home Eliquis as advised.  She was having some trouble swallowing.  She was restarted on Eliquis without any evidence of bleeding and had improvement in neck pain swelling and increased mobilization of her neck on range of motion.  She is here for hospital follow-up.   She comes today with 3 main concerns.  She has an Apple watch which monitors her heart rate.  It does keep alarming atrial fib.  She is also keeping up with her blood pressure and is very labile, and she is having some headaches with blurry vision.  She cannot feel her heart racing at all.  The swelling on the right side of her neck has vastly improved, and lower extremity edema is almost eliminated.  ROS: As above otherwise negative.  Studies Reviewed: .    Echocardiogram 10/23/2022 1. Left ventricular ejection fraction, by estimation, is 60 to 65%. The  left ventricle has normal function. The left ventricle has no regional  wall motion  abnormalities. There is mild left ventricular hypertrophy.  Left ventricular diastolic parameters  are consistent with Grade I diastolic dysfunction (impaired relaxation).  Elevated left ventricular end-diastolic pressure.   2. Right ventricular systolic function is normal. The right ventricular  size is normal.   3. Left atrial size was moderately dilated.   4. The mitral valve is abnormal. Mild mitral valve regurgitation. No  evidence of mitral stenosis. Moderate mitral annular calcification.   5. Gradients have increased since TTE done 09/08/21 . The aortic valve is  tricuspid. There is moderate calcification of the aortic valve. There is  moderate thickening of the aortic valve. Aortic valve regurgitation is not  visualized. Moderate aortic  valve stenosis.   6. The inferior vena cava is normal in size with greater than 50%  respiratory variability, suggesting right atrial pressure of 3 mmHg.     Physical Exam:   VS:  BP 133/64   Pulse (!) 58   Ht 5\' 2"  (1.575 m)   Wt 235 lb (106.6 kg)   SpO2 96%   BMI 42.98 kg/m    Wt Readings from Last 3 Encounters:  08/27/23 235 lb (106.6 kg)  08/20/23 232 lb (105.2 kg)  08/18/23 232 lb (105.2 kg)    GEN: Well nourished, well developed in no acute distress NECK: No JVD; No carotid bruits.  Healing right carotid endarterectomy incision. CARDIAC: RRR, 2/6 harsh systolic murmur heard best at the right sternal border murmurs, rubs, gallops RESPIRATORY:  Clear to auscultation without rales, wheezing or rhonchi  ABDOMEN: Soft,  non-tender, non-distended EXTREMITIES:  No edema; No deformity   ASSESSMENT AND PLAN: .    Atrial fibrillation: On view of her Apple Watch she is having PACs only, and not atrial fibrillation.  She is predominantly in normal sinus rhythm.  Will not make any changes on her medications.  She will continue Tikosyn 500 mg twice daily as directed along with apixaban 5 mg twice daily.  She is also on aspirin which I would  discontinue but will leave until followed up with EP.  2.  Hypertension: I have reviewed her blood pressure recordings.  They are labile with the lowest at 104/58 and highest in 170s over 80s.  On average her blood pressures running around 149-152 systolic over 78-84.  Will continue to monitor this.  Can adjust and add another agent but would like to continue her on current regimen for now.  If necessary will increase her losartan to 150 mg daily.  3.  Blurred vision: She has not had her eyes examined in several months.  Since she is status post carotid endarterectomy would like to make sure she follows up with ophthalmologist as her vision may have changed with surgery.  4.  Carotid artery disease: Status post right carotid artery enterectomy incision is healing well.  She does have a follow-up with surgery coming up in the next couple of weeks.  5.  Hypercholesterolemia: Currently on atorvastatin 20 mg daily.  Lipids and LFTs should be completed by our office on next visit or by PCP if this is upcoming.  Goal of LDL less than 70.    Signed, Bettey Mare. Liborio Nixon, ANP, AACC

## 2023-08-27 ENCOUNTER — Encounter: Payer: Self-pay | Admitting: Adult Health

## 2023-08-27 ENCOUNTER — Ambulatory Visit: Payer: Medicare Other | Attending: Adult Health | Admitting: Adult Health

## 2023-08-27 VITALS — BP 133/64 | HR 58 | Ht 62.0 in | Wt 235.0 lb

## 2023-08-27 DIAGNOSIS — I10 Essential (primary) hypertension: Secondary | ICD-10-CM

## 2023-08-27 DIAGNOSIS — E7800 Pure hypercholesterolemia, unspecified: Secondary | ICD-10-CM | POA: Diagnosis not present

## 2023-08-27 DIAGNOSIS — I480 Paroxysmal atrial fibrillation: Secondary | ICD-10-CM

## 2023-08-27 DIAGNOSIS — I6523 Occlusion and stenosis of bilateral carotid arteries: Secondary | ICD-10-CM

## 2023-08-27 NOTE — Patient Instructions (Signed)
Medication Instructions:  No Changes *If you need a refill on your cardiac medications before your next appointment, please call your pharmacy*   Lab Work: No Labs If you have labs (blood work) drawn today and your tests are completely normal, you will receive your results only by: MyChart Message (if you have MyChart) OR A paper copy in the mail If you have any lab test that is abnormal or we need to change your treatment, we will call you to review the results.   Testing/Procedures: No Testing   Follow-Up: At [facility], you and your health needs are our priority.  As part of our continuing mission to provide you with exceptional heart care, we have created designated Provider Care Teams.  These Care Teams include your primary Cardiologist (physician) and Advanced Practice Providers (APPs -  Physician Assistants and Nurse Practitioners) who all work together to provide you with the care you need, when you need it.  We recommend signing up for the patient portal called "MyChart".  Sign up information is provided on this After Visit Summary.  MyChart is used to connect with patients for Virtual Visits (Telemedicine).  Patients are able to view lab/test results, encounter notes, upcoming appointments, etc.  Non-urgent messages can be sent to your provider as well.   To learn more about what you can do with MyChart, go to [email or web address].    Your next appointment:   Keep Scheduled Appointment  Provider:    [name], ANP

## 2023-08-30 ENCOUNTER — Ambulatory Visit: Payer: Self-pay | Admitting: Surgery

## 2023-08-30 ENCOUNTER — Telehealth: Payer: Self-pay | Admitting: *Deleted

## 2023-08-30 DIAGNOSIS — K4090 Unilateral inguinal hernia, without obstruction or gangrene, not specified as recurrent: Secondary | ICD-10-CM | POA: Diagnosis not present

## 2023-08-30 NOTE — H&P (Signed)
Subjective    Chief Complaint: New Consultation (Right inguinal hernia)       History of Present Illness: Kristina [name] is a 77 y.o. female who is seen today as an office consultation at the request of Dr. Hyacinth Meeker for evaluation of New Consultation (Right inguinal hernia) .   This is a 77 year old female with multiple medical issues including chronic atrial fibrillation on Eliquis and aspirin who presents with a palpable bulge in her right groin.  She noticed this about 3 months ago.  It has not enlarged since she first noticed it.  Minimal discomfort.  No obstructive symptoms.  She brought this to the attention of Dr. Hyacinth Meeker who obtained a CT scan.  This showed a right inguinal hernia containing only fat.  Scan showed some stranding around the hernia sac so there was a question of strangulation but the patient has had no symptoms in this area.  She has had a complicated last few months.  On 08/18/2023, she underwent right carotid endarterectomy by Dr. Myra Gianotti.  She was readmitted for a postoperative hematoma.  She did not require further surgery.  The swelling is starting to go down.  She has resumed her Eliquis.  She has minimal symptoms related to her right groin.   Patient is originally from [place].  Years ago she had a laparoscopic cholecystectomy.  She developed an incisional hernia at her epigastric port site.  She has had 2 repairs of this hernia.  She is not clear whether they use mesh to repair that hernia.     Review of Systems: A complete review of systems was obtained from the patient.  I have reviewed this information and discussed as appropriate with the patient.  See HPI as well for other ROS.   Review of Systems  Constitutional: Negative.   HENT:  Positive for congestion.   Eyes:  Positive for blurred vision.  Respiratory: Negative.    Cardiovascular: Negative.   Gastrointestinal: Negative.   Genitourinary: Negative.   Musculoskeletal: Negative.   Skin: Negative.    Neurological:  Positive for headaches.  Endo/Heme/Allergies:  Bruises/bleeds easily.  Psychiatric/Behavioral: Negative.          Medical History: Past Medical History      Past Medical History:  Diagnosis Date   Hypertension     Sleep apnea          Problem List     Patient Active Problem List  Diagnosis   Anxiety   Aortic stenosis   Beta thalassemia minor   Carotid artery disease (CMS-HCC)   Class 3 severe obesity with serious comorbidity and body mass index (BMI) of 40.0 to 44.9 in adult (CMS/HHS-HCC)   Degenerative joint disease involving multiple joints   Endometrial polyp   Hypercoagulable state due to persistent atrial fibrillation (CMS/HHS-HCC)   Essential hypertension   Hyperlipidemia   Carotid stenosis, asymptomatic   Persistent atrial fibrillation (CMS/HHS-HCC)   Personal history of transient ischemic attack (TIA), and cerebral infarction without residual deficits   Prediabetes   Pure hypercholesterolemia   Vitamin D deficiency, unspecified        Past Surgical History  History reviewed. No pertinent surgical history.      Allergies       Allergies  Allergen Reactions   Penicillins Anaphylaxis and Itching      Did it involve swelling of the face/tongue/throat, SOB, or low BP? Yes  Did it involve sudden or severe rash/hives, skin peeling, or any reaction on the inside of your  mouth or nose? No  Did you need to seek medical attention at a hospital or doctor's office? Yes  When did it last happen?      Childhood allergy  If all above answers are "NO", may proceed with cephalosporin use.        Medications Ordered Prior to Encounter        Current Outpatient Medications on File Prior to Visit  Medication Sig Dispense Refill   apixaban (ELIQUIS) 5 mg tablet Take 5 mg by mouth       aspirin 81 MG EC tablet Take 81 mg by mouth       atorvastatin (LIPITOR) 20 MG tablet Take 20 mg by mouth every morning       dofetilide (TIKOSYN) 500 MCG capsule Take  500 mcg by mouth       dorzolamide-timoloL (COSOPT) 22.3-6.8 mg/mL ophthalmic solution         ergocalciferol, vitamin D2, 1,250 mcg (50,000 unit) capsule Take 50,000 Units by mouth every 7 (seven) days       losartan (COZAAR) 100 MG tablet Take 100 mg by mouth once daily        No current facility-administered medications on file prior to visit.        Family History       Family History  Problem Relation Age of Onset   High blood pressure (Hypertension) Mother          Tobacco Use History  Social History        Tobacco Use  Smoking Status Former   Types: Cigarettes  Smokeless Tobacco Never        Social History  Social History         Socioeconomic History   Marital status: Single  Tobacco Use   Smoking status: Former      Types: Cigarettes   Smokeless tobacco: Never  Vaping Use   Vaping status: Never Used  Substance and Sexual Activity   Alcohol use: Yes   Drug use: Never    Social Drivers of Metallurgist Insecurity: No Food Insecurity (08/20/2023)    Received from [facility]    Hunger Vital Sign     Worried About Running Out of Food in the Last Year: Never true     Ran Out of Food in the Last Year: Never true  Transportation Needs: No Transportation Needs (08/20/2023)    Received from [facility]     Lack of Transportation (Medical): No     Lack of Transportation (Non-Medical): No        Objective:          Vitals:    08/30/23 1048 08/30/23 1052  BP: (!) 160/90    Pulse: 84    Temp: 36.8 C (98.2 F)    SpO2: 97%    Weight: (!) 108.4 kg (239 lb)    Height: 157.5 cm (5\' 2" )    PainSc:   0-No pain    Body mass index is 43.71 kg/m.   Physical Exam    Constitutional:  WDWN in NAD, conversant, no obvious deformities; lying in bed comfortably Eyes:  Pupils equal, round; sclera anicteric; moist conjunctiva; no lid lag HENT:  Oral mucosa moist; good dentition  Neck:  No masses palpated, trachea midline;  no thyromegaly Lungs:  CTA bilaterally; normal respiratory effort CV:  Regular rate and rhythm; no murmurs; extremities well-perfused with no edema Abd:  +  bowel sounds, soft, non-tender, no palpable organomegaly; palpable right inguinal hernia that is spontaneously reducible.  Healed epigastric incisions. Musc: Unsteady gait.  Ambulates with the assistance of a cane. no apparent clubbing or cyanosis in extremities Lymphatic:  No palpable cervical or axillary lymphadenopathy Skin:  Warm, dry; no sign of jaundice Psychiatric - alert and oriented x 4; calm mood and affect     Labs, Imaging and Diagnostic Testing: CLINICAL DATA:  Abdominal mass, right lower quadrant   EXAM: CT ABDOMEN AND PELVIS WITH CONTRAST   TECHNIQUE: Multidetector CT imaging of the abdomen and pelvis was performed using the standard protocol following bolus administration of intravenous contrast.   RADIATION DOSE REDUCTION: This exam was performed according to the departmental dose-optimization program which includes automated exposure control, adjustment of the mA and/or kV according to patient size and/or use of iterative reconstruction technique.   CONTRAST:  75mL OMNIPAQUE IOHEXOL 350 MG/ML SOLN   COMPARISON:  None Available.   FINDINGS: Lower chest: No acute abnormality   Hepatobiliary: No focal liver abnormality is seen. Status post cholecystectomy. No biliary dilatation.   Pancreas: No focal abnormality or ductal dilatation.   Spleen: No focal abnormality.  Normal size.   Adrenals/Urinary Tract: Areas of cortical thinning and scarring within the kidneys bilaterally. No stones or hydronephrosis. No suspicious renal or adrenal mass. Urinary bladder unremarkable.   Stomach/Bowel: Sigmoid diverticulosis. No active diverticulitis. Stomach and small bowel decompressed, unremarkable.   Vascular/Lymphatic: Aortoiliac atherosclerosis. No evidence of aneurysm or adenopathy.   Reproductive: Uterus and  adnexa unremarkable.  No mass.   Other: No free fluid or free air.   Musculoskeletal: No acute bony abnormality. There is a right inguinal hernia containing fat. There is stranding around fat within the hernia suggesting strangulation. No bowel involvement.   IMPRESSION: Right inguinal hernia containing fat with surrounding stranding suggesting strangulation. No bowel involvement.   Sigmoid diverticulosis.  No active diverticulitis.   Aortoiliac atherosclerosis.     Electronically Signed   By: Charlett Nose M.D.   On: 06/27/2023 12:15     Assessment and Plan:  Diagnoses and all orders for this visit:   Right inguinal hernia     Recommend cardiac clearance followed by right inguinal hernia repair with mesh.  The patient wants to wait a few weeks for surgery due to her recent carotid endarterectomy and the postoperative course.  I think this is reasonable as the patient has minimal symptoms.  No sign of incarceration or strangulation at this time.   Right inguinal hernia repair with mesh.  The surgical procedure has been discussed with the patient.  Potential risks, benefits, alternative treatments, and expected outcomes have been explained.  All of the patient's questions at this time have been answered.  The likelihood of reaching the patient's treatment goal is good.  The patient understands the proposed surgical procedure and wishes to proceed.       Kristina Klammer Delbert Harness, MD  08/30/2023 11:23 AM

## 2023-08-30 NOTE — Telephone Encounter (Signed)
   Pre-operative Risk Assessment    Patient Name: Kristina [name]  DOB: 1946-09-14 MRN: [id number]     Last OV: [name], NP 08/17/2023  Upcoming OV: Dr. [name] 09/08/2023 Added pre op to appt notes.   Upcoming OVBernadene Person, NP 10/07/2023  Request for Surgical Clearance    Procedure:   Right Inguinal Hernia Repair with mesh surgery.   Date of Surgery:  Clearance TBD                                 Surgeon:  Dr. Manus Rudd Surgeon's Group or Practice Name:  [facility] Phone number:  [phone] Fax number:  [phone]   Type of Clearance Requested:   - Medical  - Pharmacy:  Hold Aspirin and Apixaban (Eliquis) Not Indicated.    Type of Anesthesia:  General    Additional requests/questions:    Signed, Emmit Pomfret   08/30/2023, 1:27 PM

## 2023-09-01 NOTE — Telephone Encounter (Signed)
Preoperative team, patient has upcoming appointment with cardiology in a few days.  Pharmacy has provided recommendations for anticoagulation hold.  Please add preoperative cardiac evaluation to appointment notes.  Thank you for your help.  Thomasene Ripple. Roemello Speyer NP-C     09/01/2023, 12:27 PM [facility] [address] Office [phone] Fax [phone]

## 2023-09-01 NOTE — Telephone Encounter (Signed)
Patient with diagnosis of afib on Eliquis for anticoagulation.    Procedure: Right Inguinal Hernia Repair with mesh surgery.  Date of procedure: TBD   CHA2DS2-VASc Score = 6   This indicates a 9.7% annual risk of stroke. [name] score is based upon: CHF History: 1 HTN History: 1 Diabetes History: 0 Stroke History: 0 Vascular Disease History: 1 Age Score: 2 Gender Score: 1      CrCl 64 ml/min Platelet count 274  Per office protocol, patient can hold Eliquis for 2 days prior to procedure.    **This guidance is not considered finalized until pre-operative APP has relayed final recommendations.**

## 2023-09-06 ENCOUNTER — Ambulatory Visit (INDEPENDENT_AMBULATORY_CARE_PROVIDER_SITE_OTHER): Payer: Medicare Other | Admitting: Physician Assistant

## 2023-09-06 ENCOUNTER — Encounter: Payer: Self-pay | Admitting: Physician Assistant

## 2023-09-06 VITALS — BP 137/78 | HR 63 | Temp 98.2°F | Ht 62.0 in | Wt 233.6 lb

## 2023-09-06 DIAGNOSIS — I10 Essential (primary) hypertension: Secondary | ICD-10-CM | POA: Diagnosis not present

## 2023-09-06 DIAGNOSIS — I4819 Other persistent atrial fibrillation: Secondary | ICD-10-CM | POA: Diagnosis not present

## 2023-09-06 DIAGNOSIS — E7800 Pure hypercholesterolemia, unspecified: Secondary | ICD-10-CM | POA: Diagnosis not present

## 2023-09-06 DIAGNOSIS — Z9889 Other specified postprocedural states: Secondary | ICD-10-CM

## 2023-09-06 NOTE — Progress Notes (Signed)
POST OPERATIVE OFFICE NOTE    CC:  F/u for surgery  HPI:  This is a 77 y.o. female who is s/p right CEA on 08/18/2023 by Dr. Myra Gianotti for asymptomatic carotid artery stenosis.  She has hx of left CEA for symptomatic carotid artery stenosis on 09/27/2019 by Dr. Myra Gianotti.  She did develop a mild hematoma and was readmitted on 08/20/2023. She felt she was having some difficulty with swallowing.  Her hematoma was soft. Her Eliquis was held.  She was having some left shoulder pain but xray was negative other than OA.  Two days later, she was doing well without difficulty swallowing or breathing.  She was discharged home.   Pt returns today for follow up.  Pt states she is feeling better.  She states when she got the swelling in her neck, it was really scary.  Her lip is still a little crooked since surgery but is better.  She states that sometimes when she is eating, she has to pull her lip down.  She states she has had some mild headaches on the top over to the right of her head and some blurry vision but these have gotten better.  She has appt with her eye doctor in the near future.  She states that she is taking her asa/statin.  She states she needs a hernia repair but the surgeon wants to wait until she is fully recovered from her recent surgery.    She is on Eliquis for afib.  She has a 77 y/o pup named Public affairs consultant.  Allergies  Allergen Reactions   Hydrochlorothiazide Other (See Comments)    Should not be used in patients on Tikosyn.    Penicillins Anaphylaxis and Itching    Did it involve swelling of the face/tongue/throat, SOB, or low BP? Yes Did it involve sudden or severe rash/hives, skin peeling, or any reaction on the inside of your mouth or nose? No Did you need to seek medical attention at a hospital or doctor's office? Yes When did it last happen?      Childhood allergy If all above answers are "NO", may proceed with cephalosporin use.    Mite (D. Farinae) Swelling    Other  reaction(s): sinus swelling (dust mites)    Current Outpatient Medications  Medication Sig Dispense Refill   apixaban (ELIQUIS) 5 MG TABS tablet Take 1 tablet (5 mg total) by mouth 2 (two) times daily. Pt. Was given samples from her PCP enough to last one month 180 tablet 3   aspirin EC 81 MG tablet Take 1 tablet (81 mg total) by mouth daily at 6 (six) AM. Swallow whole. 30 tablet 12   atorvastatin (LIPITOR) 20 MG tablet Take 20 mg by mouth every morning.     cyanocobalamin (VITAMIN B12) 1000 MCG tablet Take 1,000 mcg by mouth in the morning.     dofetilide (TIKOSYN) 500 MCG capsule Take 1 capsule (500 mcg total) by mouth 2 (two) times daily. 60 capsule 3   dorzolamide-timolol (COSOPT) 22.3-6.8 MG/ML ophthalmic solution Place 1 drop into both eyes 2 (two) times daily. 10 mL 4   ELDERBERRY PO Take 2 tablets by mouth in the morning. Airborne Immune gummy     ferrous sulfate 325 (65 FE) MG EC tablet Take 325 mg by mouth in the morning.     GLUCOSAMINE-CHONDROITIN PO Take 1 tablet by mouth daily.     losartan (COZAAR) 100 MG tablet Take 100 mg by mouth in the morning.  Multiple Vitamin (MULTIVITAMIN) capsule Take 1 capsule by mouth daily. Vita fusion     Multiple Vitamins-Minerals (AIRBORNE GUMMIES PO) Take 3 tablets by mouth in the morning. immune system     oxyCODONE (OXY IR/ROXICODONE) 5 MG immediate release tablet Take 1 tablet (5 mg total) by mouth every 6 (six) hours as needed for moderate pain (pain score 4-6). 12 tablet 0   Vitamin D, Ergocalciferol, (DRISDOL) 1.25 MG (50000 UNIT) CAPS capsule Take 1 capsule (50,000 Units total) by mouth every 7 (seven) days. (Patient taking differently: Take 50,000 Units by mouth every Monday.) 4 capsule 0   No current facility-administered medications for this visit.     ROS:  See HPI  Physical Exam:  Today's Vitals   09/06/23 1307  BP: 137/78  Pulse: 63  Temp: 98.2 F (36.8 C)  TempSrc: Temporal  SpO2: 98%  Weight: 233 lb 9.6 oz (106 kg)   Height: 5\' 2"  (1.575 m)   Body mass index is 42.73 kg/m.   Incision:  healed nicely.  No hematoma or ecchymosis present.   Neuro:  moving all extremities equally.  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       Assessment/Plan:  This is a 77 y.o. female who is s/p:  right CEA on 08/18/2023 by Dr. Myra Gianotti for asymptomatic carotid artery stenosis.  She has hx of left CEA for symptomatic carotid artery stenosis on 09/27/2019 by Dr. Myra Gianotti.   She was readmitted on 08/20/2023 for hematoma.   -pt doing well today.  She states that she is slowly regaining her stamina.  She is neuro in tact.  She has been compliant with her asa and statin.   -she did have some mild headaches and blurry vision after surgery but this has also been improving.   -she has a mild marginal mandibular neuropraxia that is slowly improving.  Discussed that this should resolve over time.   -encouraged her to gradually increase her walking to increase her stamina.   -given her post op complication with hematoma, she will f/u with Dr. Myra Gianotti with carotid duplex in 9 months as he is not in the office today to see her.  She is in agreement with this plan as she thought she would be seeing him today as well.     Doreatha Massed, [facility] [phone]   Clinic MD:  Karin Lieu on call MD

## 2023-09-07 DIAGNOSIS — H3582 Retinal ischemia: Secondary | ICD-10-CM | POA: Diagnosis not present

## 2023-09-07 DIAGNOSIS — H35033 Hypertensive retinopathy, bilateral: Secondary | ICD-10-CM | POA: Diagnosis not present

## 2023-09-07 DIAGNOSIS — H40053 Ocular hypertension, bilateral: Secondary | ICD-10-CM | POA: Diagnosis not present

## 2023-09-07 DIAGNOSIS — H02834 Dermatochalasis of left upper eyelid: Secondary | ICD-10-CM | POA: Diagnosis not present

## 2023-09-07 DIAGNOSIS — H3581 Retinal edema: Secondary | ICD-10-CM | POA: Diagnosis not present

## 2023-09-07 DIAGNOSIS — H2513 Age-related nuclear cataract, bilateral: Secondary | ICD-10-CM | POA: Diagnosis not present

## 2023-09-07 DIAGNOSIS — H02831 Dermatochalasis of right upper eyelid: Secondary | ICD-10-CM | POA: Diagnosis not present

## 2023-09-07 DIAGNOSIS — E119 Type 2 diabetes mellitus without complications: Secondary | ICD-10-CM | POA: Diagnosis not present

## 2023-09-07 NOTE — Progress Notes (Unsigned)
Electrophysiology Office Note:    Date:  09/08/2023   ID:  Kristina [name], DOB 1945-10-24, MRN [id number]  PCP:  Sigmund Hazel, MD   [facility] Providers Cardiologist:  Nanetta Batty, MD Electrophysiologist:  Maurice Small, MD     Referring MD: Sigmund Hazel, MD   History of Present Illness:    Kristina [name] is a 77 y.o. female with a medical history significant for moderate aortic stenosis, carotid stenosis, obstructive sleep apnea, and persistent atrial fibrillation referred for management of atrial fibrillation.     She was diagnosed with atrial fibrillation in April 2024.  She presented with dyspnea on exertion, fatigue, and elevated heart rates.  The symptoms improved with heart rate control.  She underwent DC cardioversion on Feb 25, 2023 but returned to the ED 3 days later with recurrence of atrial fibrillation.  On ER follow-up, she was not symptomatic but was alerted by her watch.  She was referred to atrial fibrillation clinic where medical therapy with Tikosyn or amiodarone was suggested.  She was thought not to be a good candidate for ablation due to to the BMI of 42.5.  Sleep study has been ordered. She had a cardioversion but remained in sinus rhythm for just three days/  She underwent Tikosyn load in September 2024.  She reports that since then she has been doing very well and has remained free of atrial fibrillation       Today, she feels well.  No recent palpitations.  Energy levels are good.  EKGs/Labs/Other Studies Reviewed Today:    Echocardiogram:  TTE 10/23/2022 EF 60-65% Mild LVH. Grade I diastolic dysfunction. Moderately dilated LA. Moderate aortic stenosis.   Monitors:   Stress testing:    Advanced imaging:    Cardiac catherization    EKG:   EKG Interpretation Date/Time:  Wednesday September 08 2023 10:30:39 EST Ventricular Rate:  72 PR Interval:  206 QRS Duration:  88 QT Interval:  394 QTC Calculation: 431 R  Axis:   64  Text Interpretation: Normal sinus rhythm Normal ECG When compared with ECG of 19-Aug-2023 00:19, Premature atrial complexes are no longer Present Confirmed by York Pellant [phone]) on 09/08/2023 11:10:22 AM     Physical Exam:    VS:  BP (!) 170/82   Pulse 72   Ht 5\' 2"  (1.575 m)   Wt 234 lb (106.1 kg)   SpO2 96%   BMI 42.80 kg/m     Wt Readings from Last 3 Encounters:  09/08/23 234 lb (106.1 kg)  09/06/23 233 lb 9.6 oz (106 kg)  08/27/23 235 lb (106.6 kg)     GEN: Well nourished, well developed in no acute distress CARDIAC: iRRR, no murmurs, rubs, gallops RESPIRATORY:  Normal work of breathing MUSCULOSKELETAL: no edema    ASSESSMENT & PLAN:    Persistent atrial fibrillation ERAF after DCCV 02/25/23 Symptomatic with fatigue, shortness of breath --improved with rate control She had CHF with rapid rates Due to the presence of symptoms, I think rhythm control is appropriate At present, BMI poses increased risk with ablation She is doing well on Tikosyn  High risk medication Tolerating Tikosyn well I reviewed today's ECG and labs from August 20, 2023  CHFpEF AF with RVR in the setting of moderate AS and Grade I diastolic dysfunction  Obstructive sleep apnea Sleep study pending  Bilateral carotid stenosis Status post CEA, now planning for contralateral CEA  Moderate aortic stenosis Monitor    Signed, Maurice Small, MD  09/08/2023  11:11 AM    [facility]

## 2023-09-08 ENCOUNTER — Ambulatory Visit: Payer: Medicare Other | Attending: Cardiovascular Disease | Admitting: Cardiovascular Disease

## 2023-09-08 ENCOUNTER — Encounter: Payer: Self-pay | Admitting: Cardiovascular Disease

## 2023-09-08 VITALS — BP 170/82 | HR 72 | Ht 62.0 in | Wt 234.0 lb

## 2023-09-08 DIAGNOSIS — I480 Paroxysmal atrial fibrillation: Secondary | ICD-10-CM

## 2023-09-08 NOTE — Patient Instructions (Signed)
Medication Instructions:  Your physician recommends that you continue on your current medications as directed. Please refer to the Current Medication list given to you today. *If you need a refill on your cardiac medications before your next appointment, please call your pharmacy*   Follow-Up: At [facility], you and your health needs are our priority.  As part of our continuing mission to provide you with exceptional heart care, we have created designated Provider Care Teams.  These Care Teams include your primary Cardiologist (physician) and Advanced Practice Providers (APPs -  Physician Assistants and Nurse Practitioners) who all work together to provide you with the care you need, when you need it.  We recommend signing up for the patient portal called "MyChart".  Sign up information is provided on this After Visit Summary.  MyChart is used to connect with patients for Virtual Visits (Telemedicine).  Patients are able to view lab/test results, encounter notes, upcoming appointments, etc.  Non-urgent messages can be sent to your provider as well.   To learn more about what you can do with MyChart, go to [email or web address].    Your next appointment:   6 month(s)  Provider:   You will follow up in the Atrial Fibrillation Clinic located at [facility]. Your provider will be: Clint R. Fenton, PA-C or Lake Bells, PA-C

## 2023-09-09 ENCOUNTER — Telehealth: Payer: Self-pay | Admitting: Cardiovascular Disease

## 2023-09-09 ENCOUNTER — Other Ambulatory Visit: Payer: Self-pay

## 2023-09-09 DIAGNOSIS — I6523 Occlusion and stenosis of bilateral carotid arteries: Secondary | ICD-10-CM

## 2023-09-09 NOTE — Telephone Encounter (Signed)
I will update the surgeon office the pt has appt 10/07/23 with [name], NP. APPT notes have been updated to reflect need preop clearance.

## 2023-09-09 NOTE — Telephone Encounter (Signed)
Caller Tresa Endo) called to follow-up on status of patient's clearance.

## 2023-09-09 NOTE — Telephone Encounter (Signed)
I will update the surgeon office the pt has appt 10/07/23 with [name], NP. APPT notes have been updated to reflect need preop clearance.   Per preop APP today Edd Fabian, FNP pt to follow up on 10/07/23. Once the pt has been cleared we will fax the notes to the surgeon office.

## 2023-09-16 DIAGNOSIS — M1711 Unilateral primary osteoarthritis, right knee: Secondary | ICD-10-CM | POA: Diagnosis not present

## 2023-09-22 ENCOUNTER — Ambulatory Visit: Payer: Medicare Other | Admitting: Cardiovascular Disease

## 2023-09-23 DIAGNOSIS — M1711 Unilateral primary osteoarthritis, right knee: Secondary | ICD-10-CM | POA: Diagnosis not present

## 2023-09-23 DIAGNOSIS — U071 COVID-19: Secondary | ICD-10-CM | POA: Diagnosis not present

## 2023-09-28 DIAGNOSIS — K146 Glossodynia: Secondary | ICD-10-CM | POA: Diagnosis not present

## 2023-09-28 DIAGNOSIS — K469 Unspecified abdominal hernia without obstruction or gangrene: Secondary | ICD-10-CM | POA: Diagnosis not present

## 2023-09-30 DIAGNOSIS — M1711 Unilateral primary osteoarthritis, right knee: Secondary | ICD-10-CM | POA: Diagnosis not present

## 2023-10-05 ENCOUNTER — Other Ambulatory Visit (HOSPITAL_COMMUNITY): Payer: Self-pay | Admitting: Physician Assistant

## 2023-10-07 ENCOUNTER — Ambulatory Visit: Payer: Medicare Other | Attending: Nurse Practitioner | Admitting: Nurse Practitioner

## 2023-10-07 ENCOUNTER — Encounter: Payer: Self-pay | Admitting: Nurse Practitioner

## 2023-10-07 VITALS — BP 138/86 | HR 62 | Ht 62.0 in | Wt 243.4 lb

## 2023-10-07 DIAGNOSIS — E785 Hyperlipidemia, unspecified: Secondary | ICD-10-CM

## 2023-10-07 DIAGNOSIS — I350 Nonrheumatic aortic (valve) stenosis: Secondary | ICD-10-CM | POA: Diagnosis not present

## 2023-10-07 DIAGNOSIS — I6523 Occlusion and stenosis of bilateral carotid arteries: Secondary | ICD-10-CM | POA: Diagnosis not present

## 2023-10-07 DIAGNOSIS — R600 Localized edema: Secondary | ICD-10-CM

## 2023-10-07 DIAGNOSIS — I480 Paroxysmal atrial fibrillation: Secondary | ICD-10-CM | POA: Diagnosis not present

## 2023-10-07 DIAGNOSIS — G4733 Obstructive sleep apnea (adult) (pediatric): Secondary | ICD-10-CM

## 2023-10-07 DIAGNOSIS — I10 Essential (primary) hypertension: Secondary | ICD-10-CM

## 2023-10-07 MED ORDER — FUROSEMIDE 40 MG PO TABS: 40.0000 mg | ORAL_TABLET | Freq: Every day | ORAL | 3 refills | Status: DC

## 2023-10-07 NOTE — Patient Instructions (Addendum)
 Medication Instructions:  Start: Furosemide  (Lasix ) 40 mg once daily *If you need a refill on your cardiac medications before your next appointment, please call your pharmacy*  Lab Work: None  Follow-Up: At [facility], you and your health needs are our priority.  As part of our continuing mission to provide you with exceptional heart care, we have created designated Provider Care Teams.  These Care Teams include your primary Cardiologist (physician) and Advanced Practice Providers (APPs -  Physician Assistants and Nurse Practitioners) who all work together to provide you with the care you need, when you need it.  Your next appointment:   10/14/2023 at 8:50 am  Provider:   Damien Braver, NP    Other Instructions Please keep a log of your weights, daily  -Same time of day -Same amount of clothes -Same Scale -Right after using the restroom, if possible -If you gain 3 pounds overnight or 5 pounds in a week, call the office.

## 2023-10-07 NOTE — Progress Notes (Signed)
 Office Visit    Patient Name: Kristina [name] Date of Encounter: 10/07/2023  Primary Care Provider:  Cleotilde Planas, MD Primary Cardiologist:  Dorn Lesches, MD  Chief Complaint    78 year old female with a history of paroxysmal atrial fibrillation, aortic stenosis, carotid artery disease, hypertension, and hyperlipidemia who presents for follow-up related to atrial fibrillation and for preoperative cardiac evaluation.   Past Medical History    Past Medical History:  Diagnosis Date   Allergic rhinitis    Anemia glaucoma   Anxiety    Atrial fibrillation (HCC)    Back pain    Carotid arterial disease (HCC)    Cataract    Mixed OU   DDD (degenerative disc disease), lumbar    Dysrhythmia    A. Fib   Family history of adverse reaction to anesthesia    daughter has PONV   Heart murmur    ECHO 10/23/22   High blood pressure    High cholesterol    History of stomach ulcers    Hypertensive retinopathy    OU   Peripheral vascular disease (HCC)    Rheumatic fever    Rheumatic fever    Rheumatoid arteritis (HCC)    Past Surgical History:  Procedure Laterality Date   APPENDECTOMY  1955   CARDIOVERSION N/A 02/25/2023   Procedure: CARDIOVERSION;  Surgeon: Lonni Slain, MD;  Location: [facility] INVASIVE CV LAB;  Service: Cardiovascular;  Laterality: N/A;   CHOLECYSTECTOMY  2006   COLONOSCOPY  2023   DILATATION & CURETTAGE/HYSTEROSCOPY WITH MYOSURE N/A 04/15/2022   Procedure: DILATATION & CURETTAGE/HYSTEROSCOPY WITH MYOSURE;  Surgeon: Rosalva Sawyer, MD;  Location: [facility] OR;  Service: Gynecology;  Laterality: N/A;   ENDARTERECTOMY Left 09/27/2019   Procedure: ENDARTERECTOMY CAROTID LEFT;  Surgeon: Serene Gaile ORN, MD;  Location: [facility] OR;  Service: Vascular;  Laterality: Left;   ENDARTERECTOMY Right 08/18/2023   Procedure: RIGHT CAROTID ENDARTERECTOMY WITH PATCH ANGIOPLASTY;  Surgeon: Serene Gaile ORN, MD;  Location: [facility] OR;  Service: Vascular;  Laterality: Right;   HERNIA REPAIR   2007   incisional hernia after cholecystecomy   HERNIA REPAIR  2015   incisional hernia   PATCH ANGIOPLASTY Left 09/27/2019   Procedure: Patch Angioplasty Using George;  Surgeon: Serene Gaile ORN, MD;  Location: [facility] OR;  Service: Vascular;  Laterality: Left;   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   TONSILLECTOMY  1963   removed at 78 years old    Allergies  Allergies  Allergen Reactions   Hydrochlorothiazide  Other (See Comments)    Should not be used in patients on Tikosyn .    Penicillins Anaphylaxis and Itching    Did it involve swelling of the face/tongue/throat, SOB, or low BP? Yes Did it involve sudden or severe rash/hives, skin peeling, or any reaction on the inside of your mouth or nose? No Did you need to seek medical attention at a hospital or doctor's office? Yes When did it last happen?      Childhood allergy If all above answers are "NO", may proceed with cephalosporin use.    Mite (D. Farinae) Swelling    Other reaction(s): sinus swelling (dust mites)     Labs/Other Studies Reviewed    The following studies were reviewed today:  Cardiac Studies & Procedures      ECHOCARDIOGRAM  ECHOCARDIOGRAM COMPLETE 10/23/2022  Narrative ECHOCARDIOGRAM REPORT    Patient Name:   Kristina [name] Date of Exam: 10/23/2022 Medical Rec #:  [id number]         Height:  63.0 in Accession #:    [id number]        Weight:       235.2 lb Date of Birth:  12-05-1945          BSA:          2.072 m Patient Age:    76 years          BP:           138/84 mmHg Patient Gender: F                 HR:           66 bpm. Exam Location:  [address]  Procedure: 2D Echo, Cardiac Doppler and Color Doppler  Indications:    I35.0 AS  History:        Patient has prior history of Echocardiogram examinations, most recent 09/08/2021. AS; Risk Factors:Hypertension, Dyslipidemia and Former Smoker.  Sonographer:    [name] Bohr RDCS Referring Phys: [phone] JONATHAN J  BERRY  IMPRESSIONS   1. Left ventricular ejection fraction, by estimation, is 60 to 65%. The left ventricle has normal function. The left ventricle has no regional wall motion abnormalities. There is mild left ventricular hypertrophy. Left ventricular diastolic parameters are consistent with Grade I diastolic dysfunction (impaired relaxation). Elevated left ventricular end-diastolic pressure. 2. Right ventricular systolic function is normal. The right ventricular size is normal. 3. Left atrial size was moderately dilated. 4. The mitral valve is abnormal. Mild mitral valve regurgitation. No evidence of mitral stenosis. Moderate mitral annular calcification. 5. Gradients have increased since TTE done 09/08/21 . The aortic valve is tricuspid. There is moderate calcification of the aortic valve. There is moderate thickening of the aortic valve. Aortic valve regurgitation is not visualized. Moderate aortic valve stenosis. 6. The inferior vena cava is normal in size with greater than 50% respiratory variability, suggesting right atrial pressure of 3 mmHg.  FINDINGS Left Ventricle: Left ventricular ejection fraction, by estimation, is 60 to 65%. The left ventricle has normal function. The left ventricle has no regional wall motion abnormalities. The left ventricular internal cavity size was normal in size. There is mild left ventricular hypertrophy. Left ventricular diastolic parameters are consistent with Grade I diastolic dysfunction (impaired relaxation). Elevated left ventricular end-diastolic pressure.  Right Ventricle: The right ventricular size is normal. No increase in right ventricular wall thickness. Right ventricular systolic function is normal.  Left Atrium: Left atrial size was moderately dilated.  Right Atrium: Right atrial size was normal in size.  Pericardium: There is no evidence of pericardial effusion.  Mitral Valve: The mitral valve is abnormal. There is mild thickening of the  mitral valve leaflet(s). There is mild calcification of the mitral valve leaflet(s). Moderate mitral annular calcification. Mild mitral valve regurgitation. No evidence of mitral valve stenosis.  Tricuspid Valve: The tricuspid valve is normal in structure. Tricuspid valve regurgitation is mild . No evidence of tricuspid stenosis.  Aortic Valve: Gradients have increased since TTE done 09/08/21. The aortic valve is tricuspid. There is moderate calcification of the aortic valve. There is moderate thickening of the aortic valve. Aortic valve regurgitation is not visualized. Moderate aortic stenosis is present. Aortic valve mean gradient measures 19.0 mmHg. Aortic valve peak gradient measures 35.1 mmHg. Aortic valve area, by VTI measures 1.22 cm.  Pulmonic Valve: The pulmonic valve was normal in structure. Pulmonic valve regurgitation is not visualized. No evidence of pulmonic stenosis.  Aorta: The aortic root is normal in size and structure.  Venous: The inferior vena cava is normal in size with greater than 50% respiratory variability, suggesting right atrial pressure of 3 mmHg.  IAS/Shunts: No atrial level shunt detected by color flow Doppler.   LEFT VENTRICLE PLAX 2D LVIDd:         5.30 cm   Diastology LVIDs:         3.50 cm   LV e' medial:    7.29 cm/s LV PW:         1.30 cm   LV E/e' medial:  16.3 LV IVS:        1.40 cm   LV e' lateral:   6.74 cm/s LVOT diam:     2.00 cm   LV E/e' lateral: 17.7 LV SV:         96 LV SV Index:   46 LVOT Area:     3.14 cm   RIGHT VENTRICLE             IVC RV S prime:     15.60 cm/s  IVC diam: 1.10 cm TAPSE (M-mode): 2.3 cm RVSP:           28.6 mmHg  LEFT ATRIUM             Index        RIGHT ATRIUM           Index LA diam:        4.30 cm 2.08 cm/m   RA Pressure: 3.00 mmHg LA Vol (A2C):   93.8 ml 45.28 ml/m  RA Area:     12.10 cm LA Vol (A4C):   71.8 ml 34.66 ml/m  RA Volume:   29.10 ml  14.05 ml/m LA Biplane Vol: 88.5 ml 42.72 ml/m AORTIC  VALVE AV Area (Vmax):    1.24 cm AV Area (Vmean):   1.19 cm AV Area (VTI):     1.22 cm AV Vmax:           296.33 cm/s AV Vmean:          202.333 cm/s AV VTI:            0.785 m AV Peak Grad:      35.1 mmHg AV Mean Grad:      19.0 mmHg LVOT Vmax:         117.00 cm/s LVOT Vmean:        76.600 cm/s LVOT VTI:          0.306 m LVOT/AV VTI ratio: 0.39  AORTA Ao Root diam: 3.00 cm Ao Asc diam:  3.30 cm  MITRAL VALVE                TRICUSPID VALVE MV Area (PHT): 2.94 cm     TR Peak grad:   25.6 mmHg MV Decel Time: 258 msec     TR Vmax:        253.00 cm/s MV E velocity: 119.00 cm/s  Estimated RAP:  3.00 mmHg MV A velocity: 112.00 cm/s  RVSP:           28.6 mmHg MV E/A ratio:  1.06 SHUNTS Systemic VTI:  0.31 m Systemic Diam: 2.00 cm  Maude Emmer MD Electronically signed by Maude Emmer MD Signature Date/Time: 10/23/2022/11:03:55 AM    Final            Recent Labs: 08/19/2023: Magnesium  2.0 08/20/2023: ALT 26; BUN 13; Creatinine, Ser 0.84; Hemoglobin 10.4; Platelets 274; Potassium 4.2; Sodium 140  Recent Lipid Panel    Component  Value Date/Time   CHOL 125 08/19/2023 0345   CHOL 169 09/23/2021 0858   TRIG 57 08/19/2023 0345   HDL 59 08/19/2023 0345   HDL 66 09/23/2021 0858   CHOLHDL 2.1 08/19/2023 0345   VLDL 11 08/19/2023 0345   LDLCALC 55 08/19/2023 0345   LDLCALC 85 09/23/2021 0858    History of Present Illness    78 year old female with the above past medical history including paroxysmal atrial fibrillation, aortic stenosis, carotid artery disease, hypertension, and hyperlipidemia.   Echocardiogram in 09/2021 showed normal LV systolic function, G1 DD, mild aortic stenosis.  Carotid Dopplers at the time revealed moderate R ICA and high-grade LICA stenosis.  She was referred to vascular surgery.  Repeat echocardiogram in 10/2022 showed EF 60 to 65%, normal LV function, no RWMA, mild LVH, G1 DD, normal RV systolic function, mild mitral valve regurgitation, moderate  aortic stenosis, mean gradient 19 mmHg, increased from prior.  Repeat echocardiogram was recommended in 1 year.  Most recent carotid ultrasound in 10/2022 showed 80 to 99% R ICA stenosis, 1 to 39% LICA stenosis.  She was asymptomatic.  Follow-up CTA of the head and neck was recommended in 6 months.  She saw her PCP in 01/2023 who identified new atrial fibrillation with RVR. She was started on Eliquis  and metoprolol . She underwent successful DCCV on 02/25/2023 with restoration of sinus rhythm.  Unfortunately, days later she had  early recurrence of atrial fibrillation, which prompted ED visit.  She was referred to the A-fib clinic/EP and ultimately underwent Tikosyn  load in 06/2023.  She underwent carotid endarterectomy in 08/2023.  She was readmitted in 08/2023 in the setting of hematoma to her R CEA surgical site.  She was last seen in the office on 09/08/2023 by EP and was doing well.   She presents today for follow-up and for preoperative cardiac evaluation for right inguinal hernia repair with mesh with Dr. Donnice Lima of [facility] surgery with request to hold Eliquis  prior to procedure. Since her last visit she has been stable overall from a cardiac standpoint.  She notes that she had COVID over Christmas.  She is gradually improving.  Over the past 2 days she has noticed increased bilateral lower extremity edema, right greater than left, weight gain.  She was previously on Lasix , however, this was discontinued. She denies chest pain, palpitations, dyspnea, PND, orthopnea.  Home Medications    Current Outpatient Medications  Medication Sig Dispense Refill   apixaban  (ELIQUIS ) 5 MG TABS tablet Take 1 tablet (5 mg total) by mouth 2 (two) times daily. Pt. Was given samples from her PCP enough to last one month 180 tablet 3   aspirin  EC 81 MG tablet Take 1 tablet (81 mg total) by mouth daily at 6 (six) AM. Swallow whole. 30 tablet 12   atorvastatin  (LIPITOR) 20 MG tablet Take 20 mg by mouth every  morning.     cyanocobalamin  (VITAMIN B12) 1000 MCG tablet Take 1,000 mcg by mouth in the morning.     dorzolamide -timolol  (COSOPT ) 22.3-6.8 MG/ML ophthalmic solution Place 1 drop into both eyes 2 (two) times daily. 10 mL 4   ELDERBERRY PO Take 2 tablets by mouth in the morning. Airborne Immune gummy     ferrous sulfate  325 (65 FE) MG EC tablet Take 325 mg by mouth in the morning.     furosemide  (LASIX ) 40 MG tablet Take 1 tablet (40 mg total) by mouth daily. 90 tablet 3   GLUCOSAMINE-CHONDROITIN PO Take 1 tablet  by mouth daily.     losartan  (COZAAR ) 100 MG tablet Take 100 mg by mouth in the morning.     Multiple Vitamin (MULTIVITAMIN) capsule Take 1 capsule by mouth daily. Vita fusion     Multiple Vitamins-Minerals (AIRBORNE GUMMIES PO) Take 3 tablets by mouth in the morning. immune system     oxyCODONE  (OXY IR/ROXICODONE ) 5 MG immediate release tablet Take 1 tablet (5 mg total) by mouth every 6 (six) hours as needed for moderate pain (pain score 4-6). 12 tablet 0   Vitamin D , Ergocalciferol , (DRISDOL ) 1.25 MG (50000 UNIT) CAPS capsule Take 1 capsule (50,000 Units total) by mouth every 7 (seven) days. (Patient taking differently: Take 50,000 Units by mouth every Monday.) 4 capsule 0   dofetilide  (TIKOSYN ) 500 MCG capsule TAKE ONE (1) CAPSULE BY MOUTH TWICE DAILY 60 capsule 6   No current facility-administered medications for this visit.     Review of Systems    She denies chest pain, palpitations, dyspnea, pnd, orthopnea, n, v, dizziness, syncope, edema, weight gain, or early satiety. All other systems reviewed and are otherwise negative except as noted above.   Physical Exam    VS:  BP 138/86 (BP Location: Right Arm, Patient Position: Sitting, Cuff Size: Large)   Pulse 62   Ht 5' 2 (1.575 m)   Wt 243 lb 6.4 oz (110.4 kg)   SpO2 95%   BMI 44.52 kg/m   GEN: Well nourished, well developed, in no acute distress. HEENT: normal. Neck: Supple, no JVD, carotid bruits, or masses. Cardiac:  RRR, 3/6 murmur, no rubs, or gallops. No clubbing, cyanosis, nonpitting bilateral lower extremity edema.  Radials/DP/PT 2+ and equal bilaterally.  Respiratory:  Respirations regular and unlabored, clear to auscultation bilaterally. GI: Soft, nontender, nondistended, BS + x 4. MS: no deformity or atrophy. Skin: warm and dry, no rash. Neuro:  Strength and sensation are intact. Psych: Normal affect.  Accessory Clinical Findings    ECG personally reviewed by me today - EKG Interpretation Date/Time:  Thursday October 07 2023 10:24:19 EST Ventricular Rate:  62 PR Interval:  208 QRS Duration:  88 QT Interval:  442 QTC Calculation: 448 R Axis:   28  Text Interpretation: Sinus rhythm with Premature atrial complexes When compared with ECG of 08-Sep-2023 10:30, Premature atrial complexes are now Present Confirmed by Daneen Perkins ([id number]) on 10/07/2023 10:27:56 AM  - no acute changes.   Lab Results  Component Value Date   WBC 7.3 08/20/2023   HGB 10.4 (L) 08/20/2023   HCT 34.6 (L) 08/20/2023   MCV 66.7 (L) 08/20/2023   PLT 274 08/20/2023   Lab Results  Component Value Date   CREATININE 0.84 08/20/2023   BUN 13 08/20/2023   NA 140 08/20/2023   K 4.2 08/20/2023   CL 105 08/20/2023   CO2 28 08/20/2023   Lab Results  Component Value Date   ALT 26 08/20/2023   AST 22 08/20/2023   ALKPHOS 54 08/20/2023   BILITOT 0.9 08/20/2023   Lab Results  Component Value Date   CHOL 125 08/19/2023   HDL 59 08/19/2023   LDLCALC 55 08/19/2023   TRIG 57 08/19/2023   CHOLHDL 2.1 08/19/2023    Lab Results  Component Value Date   HGBA1C 5.6 01/27/2022    Assessment & Plan    1. Persistent atrial fibrillation: S/p DCCV on 02/25/2023 with early recurrence of atrial fibrillation.  Following her cardioversion she noted increased shortness of breath, bilateral lower extremity edema, improved with  Lasix .  S/p Tikosyn  load per EP.  EKG today shows sinus rhythm with PACs, Qtc stable. Continue metoprolol ,  Tikosyn , Eliquis .    2. Aortic stenosis/bilateral lower extremity edema: Most recent echo in 10/2022 showed EF 60 to 65%, normal LV function, no RWMA, mild LVH, G1 DD, normal RV systolic function, mild mitral valve regurgitation, moderate aortic stenosis, mean gradient 19 mmHg, increased from prior. Will repeat echocardiogram for routine monitoring.  She does note a 2 to 3-day history of increased bilateral lower extremity edema, right greater than left, weight gain.  She denies dyspnea, PND, orthopnea.  Will resume Lasix  40 mg daily.  Will have her follow-up in 1 week to reassess symptoms, check lab (BMET).    3. Carotid artery disease: Most recent carotid ultrasound in 10/2022 showed 80 to 99% R ICA stenosis, 1 to 39% LICA stenosis.  CT of the head and neck in July 2024 showed closer to 70% stenosis.  S/p R CEA in 08/2023.  Following with vascular surgery.  Continue ASA, Lipitor.    4. Hypertension: BP well controlled. Continue current antihypertensive regimen.    5. Hyperlipidemia: LDL was 70 in 10/2022.  Continue Lipitor.   6. OSA: Adherent to CPAP.   7. Preoperative cardiac exam: According to the Revised Cardiac Risk Index (RCRI), her Perioperative Risk of Major Cardiac Event is (%): 0.4.  She is able to complete greater than 4 METS at baseline.  Presents today with new lower extremity edema ,will resume Lasix  as above with plans for close follow-up.  If LE edema improves with Lasix , will finalize cardiac clearance at follow-up.  Per office protocol, she may hold Eliquis  for 2 days prior to procedure.  Please resume Eliquis  as soon as possible postprocedure, at the discretion of the surgeon. From a cardiac standpoint, she may hold aspirin  for 5 to 7 days prior to procedure; however, given she is s/p recent R CEA, final recommendations for holding aspirin  prior to surgery should come from vascular surgery.   8. Disposition: Follow-up in 1 week.       Damien JAYSON Braver, NP 10/07/2023, 12:24 PM

## 2023-10-14 ENCOUNTER — Ambulatory Visit: Payer: Medicare Other | Attending: Nurse Practitioner | Admitting: Nurse Practitioner

## 2023-10-14 ENCOUNTER — Encounter: Payer: Self-pay | Admitting: Nurse Practitioner

## 2023-10-14 VITALS — BP 162/82 | HR 77 | Ht 62.0 in | Wt 235.0 lb

## 2023-10-14 DIAGNOSIS — R600 Localized edema: Secondary | ICD-10-CM

## 2023-10-14 DIAGNOSIS — Z01810 Encounter for preprocedural cardiovascular examination: Secondary | ICD-10-CM

## 2023-10-14 DIAGNOSIS — I6523 Occlusion and stenosis of bilateral carotid arteries: Secondary | ICD-10-CM | POA: Diagnosis not present

## 2023-10-14 DIAGNOSIS — E785 Hyperlipidemia, unspecified: Secondary | ICD-10-CM | POA: Diagnosis not present

## 2023-10-14 DIAGNOSIS — G4733 Obstructive sleep apnea (adult) (pediatric): Secondary | ICD-10-CM

## 2023-10-14 DIAGNOSIS — I350 Nonrheumatic aortic (valve) stenosis: Secondary | ICD-10-CM | POA: Diagnosis not present

## 2023-10-14 DIAGNOSIS — I4819 Other persistent atrial fibrillation: Secondary | ICD-10-CM

## 2023-10-14 DIAGNOSIS — I10 Essential (primary) hypertension: Secondary | ICD-10-CM | POA: Diagnosis not present

## 2023-10-14 LAB — BASIC METABOLIC PANEL
BUN/Creatinine Ratio: 31 — ABNORMAL HIGH (ref 12–28)
BUN: 19 mg/dL (ref 8–27)
CO2: 28 mmol/L (ref 20–29)
Calcium: 9.5 mg/dL (ref 8.7–10.3)
Chloride: 99 mmol/L (ref 96–106)
Creatinine, Ser: 0.61 mg/dL (ref 0.57–1.00)
Glucose: 88 mg/dL (ref 70–99)
Potassium: 4.8 mmol/L (ref 3.5–5.2)
Sodium: 141 mmol/L (ref 134–144)
eGFR: 92 mL/min/{1.73_m2} (ref >59)

## 2023-10-14 NOTE — Progress Notes (Signed)
 Office Visit    Patient Name: Kristina [name] Date of Encounter: 10/14/2023  Primary Care Provider:  Cleotilde Planas, MD Primary Cardiologist:  Dorn Lesches, MD  Chief Complaint    78 year old female with a history of paroxysmal atrial fibrillation, aortic stenosis, carotid artery disease, hypertension, and hyperlipidemia who presents for follow-up related to atrial fibrillation and for preoperative cardiac evaluation.   Past Medical History    Past Medical History:  Diagnosis Date   Allergic rhinitis    Anemia glaucoma   Anxiety    Atrial fibrillation (HCC)    Back pain    Carotid arterial disease (HCC)    Cataract    Mixed OU   DDD (degenerative disc disease), lumbar    Dysrhythmia    A. Fib   Family history of adverse reaction to anesthesia    daughter has PONV   Heart murmur    ECHO 10/23/22   High blood pressure    High cholesterol    History of stomach ulcers    Hypertensive retinopathy    OU   Peripheral vascular disease (HCC)    Rheumatic fever    Rheumatic fever    Rheumatoid arteritis (HCC)    Past Surgical History:  Procedure Laterality Date   APPENDECTOMY  1955   CARDIOVERSION N/A 02/25/2023   Procedure: CARDIOVERSION;  Surgeon: Lonni Slain, MD;  Location: [facility] INVASIVE CV LAB;  Service: Cardiovascular;  Laterality: N/A;   CHOLECYSTECTOMY  2006   COLONOSCOPY  2023   DILATATION & CURETTAGE/HYSTEROSCOPY WITH MYOSURE N/A 04/15/2022   Procedure: DILATATION & CURETTAGE/HYSTEROSCOPY WITH MYOSURE;  Surgeon: Rosalva Sawyer, MD;  Location: [facility] OR;  Service: Gynecology;  Laterality: N/A;   ENDARTERECTOMY Left 09/27/2019   Procedure: ENDARTERECTOMY CAROTID LEFT;  Surgeon: Serene Gaile ORN, MD;  Location: [facility] OR;  Service: Vascular;  Laterality: Left;   ENDARTERECTOMY Right 08/18/2023   Procedure: RIGHT CAROTID ENDARTERECTOMY WITH PATCH ANGIOPLASTY;  Surgeon: Serene Gaile ORN, MD;  Location: [facility] OR;  Service: Vascular;  Laterality: Right;   HERNIA REPAIR   2007   incisional hernia after cholecystecomy   HERNIA REPAIR  2015   incisional hernia   PATCH ANGIOPLASTY Left 09/27/2019   Procedure: Patch Angioplasty Using George;  Surgeon: Serene Gaile ORN, MD;  Location: [facility] OR;  Service: Vascular;  Laterality: Left;   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   TONSILLECTOMY  1963   removed at 78 years old    Allergies  Allergies  Allergen Reactions   Hydrochlorothiazide  Other (See Comments)    Should not be used in patients on Tikosyn .    Penicillins Anaphylaxis and Itching    Did it involve swelling of the face/tongue/throat, SOB, or low BP? Yes Did it involve sudden or severe rash/hives, skin peeling, or any reaction on the inside of your mouth or nose? No Did you need to seek medical attention at a hospital or doctor's office? Yes When did it last happen?      Childhood allergy If all above answers are "NO", may proceed with cephalosporin use.    Mite (D. Farinae) Swelling    Other reaction(s): sinus swelling (dust mites)     Labs/Other Studies Reviewed    The following studies were reviewed today:  Cardiac Studies & Procedures      ECHOCARDIOGRAM  ECHOCARDIOGRAM COMPLETE 10/23/2022  Narrative ECHOCARDIOGRAM REPORT    Patient Name:   Kristina [name] Date of Exam: 10/23/2022 Medical Rec #:  [id number]         Height:  63.0 in Accession #:    [id number]        Weight:       235.2 lb Date of Birth:  1946/09/30          BSA:          2.072 m Patient Age:    76 years          BP:           138/84 mmHg Patient Gender: F                 HR:           66 bpm. Exam Location:  [address]  Procedure: 2D Echo, Cardiac Doppler and Color Doppler  Indications:    I35.0 AS  History:        Patient has prior history of Echocardiogram examinations, most recent 09/08/2021. AS; Risk Factors:Hypertension, Dyslipidemia and Former Smoker.  Sonographer:    [name] Bohr RDCS Referring Phys: [phone] JONATHAN J  BERRY  IMPRESSIONS   1. Left ventricular ejection fraction, by estimation, is 60 to 65%. The left ventricle has normal function. The left ventricle has no regional wall motion abnormalities. There is mild left ventricular hypertrophy. Left ventricular diastolic parameters are consistent with Grade I diastolic dysfunction (impaired relaxation). Elevated left ventricular end-diastolic pressure. 2. Right ventricular systolic function is normal. The right ventricular size is normal. 3. Left atrial size was moderately dilated. 4. The mitral valve is abnormal. Mild mitral valve regurgitation. No evidence of mitral stenosis. Moderate mitral annular calcification. 5. Gradients have increased since TTE done 09/08/21 . The aortic valve is tricuspid. There is moderate calcification of the aortic valve. There is moderate thickening of the aortic valve. Aortic valve regurgitation is not visualized. Moderate aortic valve stenosis. 6. The inferior vena cava is normal in size with greater than 50% respiratory variability, suggesting right atrial pressure of 3 mmHg.  FINDINGS Left Ventricle: Left ventricular ejection fraction, by estimation, is 60 to 65%. The left ventricle has normal function. The left ventricle has no regional wall motion abnormalities. The left ventricular internal cavity size was normal in size. There is mild left ventricular hypertrophy. Left ventricular diastolic parameters are consistent with Grade I diastolic dysfunction (impaired relaxation). Elevated left ventricular end-diastolic pressure.  Right Ventricle: The right ventricular size is normal. No increase in right ventricular wall thickness. Right ventricular systolic function is normal.  Left Atrium: Left atrial size was moderately dilated.  Right Atrium: Right atrial size was normal in size.  Pericardium: There is no evidence of pericardial effusion.  Mitral Valve: The mitral valve is abnormal. There is mild thickening of the  mitral valve leaflet(s). There is mild calcification of the mitral valve leaflet(s). Moderate mitral annular calcification. Mild mitral valve regurgitation. No evidence of mitral valve stenosis.  Tricuspid Valve: The tricuspid valve is normal in structure. Tricuspid valve regurgitation is mild . No evidence of tricuspid stenosis.  Aortic Valve: Gradients have increased since TTE done 09/08/21. The aortic valve is tricuspid. There is moderate calcification of the aortic valve. There is moderate thickening of the aortic valve. Aortic valve regurgitation is not visualized. Moderate aortic stenosis is present. Aortic valve mean gradient measures 19.0 mmHg. Aortic valve peak gradient measures 35.1 mmHg. Aortic valve area, by VTI measures 1.22 cm.  Pulmonic Valve: The pulmonic valve was normal in structure. Pulmonic valve regurgitation is not visualized. No evidence of pulmonic stenosis.  Aorta: The aortic root is normal in size and structure.  Venous: The inferior vena cava is normal in size with greater than 50% respiratory variability, suggesting right atrial pressure of 3 mmHg.  IAS/Shunts: No atrial level shunt detected by color flow Doppler.   LEFT VENTRICLE PLAX 2D LVIDd:         5.30 cm   Diastology LVIDs:         3.50 cm   LV e' medial:    7.29 cm/s LV PW:         1.30 cm   LV E/e' medial:  16.3 LV IVS:        1.40 cm   LV e' lateral:   6.74 cm/s LVOT diam:     2.00 cm   LV E/e' lateral: 17.7 LV SV:         96 LV SV Index:   46 LVOT Area:     3.14 cm   RIGHT VENTRICLE             IVC RV S prime:     15.60 cm/s  IVC diam: 1.10 cm TAPSE (M-mode): 2.3 cm RVSP:           28.6 mmHg  LEFT ATRIUM             Index        RIGHT ATRIUM           Index LA diam:        4.30 cm 2.08 cm/m   RA Pressure: 3.00 mmHg LA Vol (A2C):   93.8 ml 45.28 ml/m  RA Area:     12.10 cm LA Vol (A4C):   71.8 ml 34.66 ml/m  RA Volume:   29.10 ml  14.05 ml/m LA Biplane Vol: 88.5 ml 42.72 ml/m AORTIC  VALVE AV Area (Vmax):    1.24 cm AV Area (Vmean):   1.19 cm AV Area (VTI):     1.22 cm AV Vmax:           296.33 cm/s AV Vmean:          202.333 cm/s AV VTI:            0.785 m AV Peak Grad:      35.1 mmHg AV Mean Grad:      19.0 mmHg LVOT Vmax:         117.00 cm/s LVOT Vmean:        76.600 cm/s LVOT VTI:          0.306 m LVOT/AV VTI ratio: 0.39  AORTA Ao Root diam: 3.00 cm Ao Asc diam:  3.30 cm  MITRAL VALVE                TRICUSPID VALVE MV Area (PHT): 2.94 cm     TR Peak grad:   25.6 mmHg MV Decel Time: 258 msec     TR Vmax:        253.00 cm/s MV E velocity: 119.00 cm/s  Estimated RAP:  3.00 mmHg MV A velocity: 112.00 cm/s  RVSP:           28.6 mmHg MV E/A ratio:  1.06 SHUNTS Systemic VTI:  0.31 m Systemic Diam: 2.00 cm  Maude Emmer MD Electronically signed by Maude Emmer MD Signature Date/Time: 10/23/2022/11:03:55 AM    Final            Recent Labs: 08/19/2023: Magnesium  2.0 08/20/2023: ALT 26; BUN 13; Creatinine, Ser 0.84; Hemoglobin 10.4; Platelets 274; Potassium 4.2; Sodium 140  Recent Lipid Panel    Component  Value Date/Time   CHOL 125 08/19/2023 0345   CHOL 169 09/23/2021 0858   TRIG 57 08/19/2023 0345   HDL 59 08/19/2023 0345   HDL 66 09/23/2021 0858   CHOLHDL 2.1 08/19/2023 0345   VLDL 11 08/19/2023 0345   LDLCALC 55 08/19/2023 0345   LDLCALC 85 09/23/2021 0858    History of Present Illness    78 year old female with the above past medical history including paroxysmal atrial fibrillation, aortic stenosis, carotid artery disease, hypertension, and hyperlipidemia.   Echocardiogram in 09/2021 showed normal LV systolic function, G1 DD, mild aortic stenosis.  Carotid Dopplers at the time revealed moderate R ICA and high-grade LICA stenosis.  She was referred to vascular surgery.  Repeat echocardiogram in 10/2022 showed EF 60 to 65%, normal LV function, no RWMA, mild LVH, G1 DD, normal RV systolic function, mild mitral valve regurgitation, moderate  aortic stenosis, mean gradient 19 mmHg, increased from prior.  Repeat echocardiogram was recommended in 1 year.  Most recent carotid ultrasound in 10/2022 showed 80 to 99% R ICA stenosis, 1 to 39% LICA stenosis.  She was asymptomatic.  Follow-up CTA of the head and neck was recommended in 6 months.  She saw her PCP in 01/2023 who identified new atrial fibrillation with RVR. She was started on Eliquis  and metoprolol . She underwent successful DCCV on 02/25/2023 with restoration of sinus rhythm.  Unfortunately, days later she had  early recurrence of atrial fibrillation, which prompted ED visit.  She was referred to the A-fib clinic/EP and ultimately underwent Tikosyn  load in 06/2023.  She underwent carotid endarterectomy in 08/2023.  She was readmitted in 08/2023 in the setting of hematoma to her R CEA surgical site.  She was last seen in the office on 10/07/2023 and noted increased bilateral lower extremity edema.  Lasix  was resumed.   She presents today for follow-up and for preoperative cardiac evaluation for right inguinal hernia repair with mesh with Dr. Donnice Lima of [facility] surgery with request to hold Aspirin  and Eliquis  prior to procedure. Since her last visit she has done well from a cardiac standpoint.  Her lower extremity edema has improved dramatically with resumption of oral Lasix , weight is down.  Her BP is elevated in office today, she notes she just took her blood pressure medication prior to this visit.  She denies chest pain, dyspnea, PND, orthopnea, weight gain.  Overall, she reports feeling well.  Home Medications    Current Outpatient Medications  Medication Sig Dispense Refill   apixaban  (ELIQUIS ) 5 MG TABS tablet Take 1 tablet (5 mg total) by mouth 2 (two) times daily. Pt. Was given samples from her PCP enough to last one month 180 tablet 3   aspirin  EC 81 MG tablet Take 1 tablet (81 mg total) by mouth daily at 6 (six) AM. Swallow whole. 30 tablet 12   atorvastatin  (LIPITOR) 20  MG tablet Take 20 mg by mouth every morning.     cyanocobalamin  (VITAMIN B12) 1000 MCG tablet Take 1,000 mcg by mouth in the morning.     dofetilide  (TIKOSYN ) 500 MCG capsule TAKE ONE (1) CAPSULE BY MOUTH TWICE DAILY 60 capsule 6   dorzolamide -timolol  (COSOPT ) 22.3-6.8 MG/ML ophthalmic solution Place 1 drop into both eyes 2 (two) times daily. 10 mL 4   ELDERBERRY PO Take 2 tablets by mouth in the morning. Airborne Immune gummy     ferrous sulfate  325 (65 FE) MG EC tablet Take 325 mg by mouth in the morning.  furosemide  (LASIX ) 40 MG tablet Take 1 tablet (40 mg total) by mouth daily. 90 tablet 3   GLUCOSAMINE-CHONDROITIN PO Take 1 tablet by mouth daily.     losartan  (COZAAR ) 100 MG tablet Take 100 mg by mouth in the morning.     Multiple Vitamin (MULTIVITAMIN) capsule Take 1 capsule by mouth daily. Vita fusion     Multiple Vitamins-Minerals (AIRBORNE GUMMIES PO) Take 3 tablets by mouth in the morning. immune system     Vitamin D , Ergocalciferol , (DRISDOL ) 1.25 MG (50000 UNIT) CAPS capsule Take 1 capsule (50,000 Units total) by mouth every 7 (seven) days. (Patient taking differently: Take 50,000 Units by mouth every Monday.) 4 capsule 0   oxyCODONE  (OXY IR/ROXICODONE ) 5 MG immediate release tablet Take 1 tablet (5 mg total) by mouth every 6 (six) hours as needed for moderate pain (pain score 4-6). 12 tablet 0   No current facility-administered medications for this visit.     Review of Systems    She denies chest pain, palpitations, dyspnea, pnd, orthopnea, n, v, dizziness, syncope, edema, weight gain, or early satiety. All other systems reviewed and are otherwise negative except as noted above.   Physical Exam    VS:  BP (!) 162/82   Pulse 77   Ht 5' 2 (1.575 m)   Wt 235 lb (106.6 kg)   SpO2 98%   BMI 42.98 kg/m   GEN: Well nourished, well developed, in no acute distress. HEENT: normal. Neck: Supple, no JVD, carotid bruits, or masses. Cardiac: RRR, no murmurs, rubs, or gallops. No  clubbing, cyanosis, mild nonpitting bilateral lower extremity edema, right greater than left.  Radials/DP/PT 2+ and equal bilaterally.  Respiratory:  Respirations regular and unlabored, clear to auscultation bilaterally. GI: Soft, nontender, nondistended, BS + x 4. MS: no deformity or atrophy. Skin: warm and dry, no rash. Neuro:  Strength and sensation are intact. Psych: Normal affect.  Accessory Clinical Findings    ECG personally reviewed by me today -    - no EKG in office today (pt declined). EKG on 10/07/2023 showed sinus rhythm with PACs, stable QTc.   Lab Results  Component Value Date   WBC 7.3 08/20/2023   HGB 10.4 (L) 08/20/2023   HCT 34.6 (L) 08/20/2023   MCV 66.7 (L) 08/20/2023   PLT 274 08/20/2023   Lab Results  Component Value Date   CREATININE 0.84 08/20/2023   BUN 13 08/20/2023   NA 140 08/20/2023   K 4.2 08/20/2023   CL 105 08/20/2023   CO2 28 08/20/2023   Lab Results  Component Value Date   ALT 26 08/20/2023   AST 22 08/20/2023   ALKPHOS 54 08/20/2023   BILITOT 0.9 08/20/2023   Lab Results  Component Value Date   CHOL 125 08/19/2023   HDL 59 08/19/2023   LDLCALC 55 08/19/2023   TRIG 57 08/19/2023   CHOLHDL 2.1 08/19/2023    Lab Results  Component Value Date   HGBA1C 5.6 01/27/2022    Assessment & Plan    1. Persistent atrial fibrillation: S/p DCCV on 02/25/2023 with early recurrence of atrial fibrillation.  Following her cardioversion she noted increased shortness of breath, bilateral lower extremity edema, improved with Lasix .  S/p Tikosyn  load per EP.  Maintaining sinus rhythm on exam.  EKG 1 week ago with stable QT.  She has follow-up planned with the A-fib clinic in 03/2024.  Continue metoprolol , Tikosyn , Eliquis .    2. Aortic stenosis/bilateral lower extremity edema: Most recent echo in  10/2022 showed EF 60 to 65%, normal LV function, no RWMA, mild LVH, G1 DD, normal RV systolic function, mild mitral valve regurgitation, moderate aortic stenosis,  mean gradient 19 mmHg, increased from prior. Will repeat echocardiogram for routine monitoring.  Recent increase in lower extremity edema, dramatically improved with resumption of oral Lasix .  She denies dyspnea, PND, orthopnea.  Will repeat BMET today.  Continue Lasix .  3. Carotid artery disease: Most recent carotid ultrasound in 10/2022 showed 80 to 99% R ICA stenosis, 1 to 39% LICA stenosis.  CT of the head and neck in July 2024 showed closer to 70% stenosis.  S/p R CEA in 08/2023.  Following with vascular surgery.  Continue ASA, Lipitor.    4. Hypertension: BP elevated in office today, has been generally well-controlled.  She will continue to monitor BP and report BP consistent greater than 140/90.  For now, continue current antihypertensive regimen.    5. Hyperlipidemia: LDL was 70 in 10/2022.  Continue Lipitor.   6. OSA: Adherent to CPAP.   7. Preoperative cardiac exam: According to the Revised Cardiac Risk Index (RCRI), her Perioperative Risk of Major Cardiac Event is (%): 0.4.  She is able to complete greater than 4 METS at baseline.  Therefore, based on ACC/AHA guidelines, patient would be at acceptable risk for the planned procedure without further cardiovascular testing.  Repeat echo pending for routine monitoring of aortic stenosis, this should not preclude surgery.  Per office protocol, she may hold Eliquis  for 2 days prior to procedure.  Please resume Eliquis  as soon as possible postprocedure, at the discretion of the surgeon. From a cardiac standpoint, she may hold aspirin  for 5 to 7 days prior to procedure; however, given she is s/p recent R CEA, final recommendations for holding aspirin  prior to surgery should come from vascular surgery. I will route this recommendation to the requesting party via Epic fax function.   8. Disposition: Follow-up in 3-4 months with general cardiology, follow-up per recall with A-fib clinic in 03/2024.  HYPERTENSION CONTROL Vitals:   10/14/23 0858 10/14/23  0919  BP: (!) 204/81 (!) 162/82    [name] blood pressure is elevated above target today.  In order to address the patient's elevated BP: Blood pressure will be monitored at home to determine if medication changes need to be made.; Follow up with general cardiology has been recommended.      Damien JAYSON Braver, NP 10/14/2023, 9:28 AM

## 2023-10-14 NOTE — Patient Instructions (Addendum)
 Medication Instructions:  Your physician recommends that you continue on your current medications as directed. Please refer to the Current Medication list given to you today.  *If you need a refill on your cardiac medications before your next appointment, please call your pharmacy*   Lab Work: BMET today   Testing/Procedures: Your physician has requested that you have an echocardiogram. Echocardiography is a painless test that uses sound waves to create images of your heart. It provides your doctor with information about the size and shape of your heart and how well your heart's chambers and valves are working. This procedure takes approximately one hour. There are no restrictions for this procedure. Please do NOT wear cologne, perfume, aftershave, or lotions (deodorant is allowed). Please arrive 15 minutes prior to your appointment time.  Please note: We ask at that you not bring children with you during ultrasound (echo/ vascular) testing. Due to room size and safety concerns, children are not allowed in the ultrasound rooms during exams. Our front office staff cannot provide observation of children in our lobby area while testing is being conducted. An adult accompanying a patient to their appointment will only be allowed in the ultrasound room at the discretion of the ultrasound technician under special circumstances. We apologize for any inconvenience.     Follow-Up: At [facility], you and your health needs are our priority.  As part of our continuing mission to provide you with exceptional heart care, we have created designated Provider Care Teams.  These Care Teams include your primary Cardiologist (physician) and Advanced Practice Providers (APPs -  Physician Assistants and Nurse Practitioners) who all work together to provide you with the care you need, when you need it.  We recommend signing up for the patient portal called MyChart.  Sign up information is provided on  this After Visit Summary.  MyChart is used to connect with patients for Virtual Visits (Telemedicine).  Patients are able to view lab/test results, encounter notes, upcoming appointments, etc.  Non-urgent messages can be sent to your provider as well.   To learn more about what you can do with MyChart, go to [email or web address].    Your next appointment:   3-4 month(s)  Provider:   Damien Braver, NP        Other Instructions Report blood pressure consistently greater than 140/90

## 2023-10-22 DIAGNOSIS — K4090 Unilateral inguinal hernia, without obstruction or gangrene, not specified as recurrent: Secondary | ICD-10-CM | POA: Diagnosis not present

## 2023-11-04 ENCOUNTER — Ambulatory Visit (HOSPITAL_COMMUNITY): Payer: Medicare Other | Attending: Nurse Practitioner

## 2023-11-04 DIAGNOSIS — I350 Nonrheumatic aortic (valve) stenosis: Secondary | ICD-10-CM | POA: Diagnosis not present

## 2023-11-04 LAB — ECHOCARDIOGRAM COMPLETE
AR max vel: 1.2 cm2
AV Area VTI: 1.26 cm2
AV Area mean vel: 1.12 cm2
AV Mean grad: 19 mm[Hg]
AV Peak grad: 31.2 mm[Hg]
Ao pk vel: 2.8 m/s
Area-P 1/2: 2.26 cm2
MV M vel: 5.06 m/s
MV Peak grad: 102.4 mm[Hg]
S' Lateral: 3.79 cm

## 2023-11-10 ENCOUNTER — Encounter (HOSPITAL_COMMUNITY): Payer: Self-pay

## 2023-11-10 NOTE — Progress Notes (Signed)
 Surgical Instructions   Your procedure is scheduled on Wednesday, November 17, 2023. Report to [facility] Main Entrance A at 8:30 A.M., then check in with the Admitting office. Any questions or running late day of surgery: call [phone]  Questions prior to your surgery date: call [phone], Monday-Friday, 8am-4pm. If you experience any cold or flu symptoms such as cough, fever, chills, shortness of breath, etc. between now and your scheduled surgery, please notify us  at the above number.     Remember:  Do not eat after midnight the night before your surgery   You may drink clear liquids until 7:30 the morning of your surgery.   Clear liquids allowed are: Water, Non-Citrus Juices (without pulp), Carbonated Beverages, Clear Tea (no milk, honey, etc.), Black Coffee Only (NO MILK, CREAM OR POWDERED CREAMER of any kind), and Gatorade.    Patient Instructions  The night before surgery:  No food after midnight. ONLY clear liquids after midnight  The day of surgery (if you do NOT have diabetes):  Drink ONE (1) Pre-Surgery Clear Ensure by 7:30 the morning of surgery. Drink in one sitting. Do not sip.  This drink was given to you during your hospital pre-op appointment visit.  Nothing else to drink after completing the Pre-Surgery Clear Ensure.         If you have questions, please contact your surgeon's office.   Take these medicines the morning of surgery with A SIP OF WATER  atorvastatin  (LIPITOR)  dofetilide  (TIKOSYN )    Per your physician, stop taking your apixaban  (ELIQUIS ) 3 day's prior to your surgery. Your last dose of apixaban  (ELIQUIS ) should be taken on 11-14-23.  Per your physician, stop taking your Aspirin  7 day's prior to your surgery.  Your last dose of Aspirin  should be taken on 11-09-23.       One week prior to surgery, STOP taking any Aspirin  (unless otherwise instructed by your surgeon) Aleve, Naproxen, Ibuprofen , Motrin , Advil , Goody's, BC's, all herbal  medications, fish oil, and non-prescription vitamins.                     Do NOT Smoke (Tobacco/Vaping) for 24 hours prior to your procedure.  If you use a CPAP at night, you may bring your mask/headgear for your overnight stay.   You will be asked to remove any contacts, glasses, piercing's, hearing aid's, dentures/partials prior to surgery. Please bring cases for these items if needed.    Patients discharged the day of surgery will not be allowed to drive home, and someone needs to stay with them for 24 hours.  SURGICAL WAITING ROOM VISITATION Patients may have no more than 2 support people in the waiting area - these visitors may rotate.   Pre-op nurse will coordinate an appropriate time for 1 ADULT support person, who may not rotate, to accompany patient in pre-op.  Children under the age of 74 must have an adult with them who is not the patient and must remain in the main waiting area with an adult.  If the patient needs to stay at the hospital during part of their recovery, the visitor guidelines for inpatient rooms apply.  Please refer to the [facility] website for the visitor guidelines for any additional information.   If you received a COVID test during your pre-op visit  it is requested that you wear a mask when out in public, stay away from anyone that may not be feeling well and notify your surgeon if you  develop symptoms. If you have been in contact with anyone that has tested positive in the last 10 days please notify you surgeon.      Pre-operative CHG Bathing Instructions   You can play a key role in reducing the risk of infection after surgery. Your skin needs to be as free of germs as possible. You can reduce the number of germs on your skin by washing with CHG (chlorhexidine  gluconate) soap before surgery. CHG is an antiseptic soap that kills germs and continues to kill germs even after washing.   DO NOT use if you have an allergy to chlorhexidine /CHG or  antibacterial soaps. If your skin becomes reddened or irritated, stop using the CHG and notify one of our RNs at [phone].              TAKE A SHOWER THE NIGHT BEFORE SURGERY AND THE DAY OF SURGERY    Please keep in mind the following:  DO NOT shave, including legs and underarms, 48 hours prior to surgery.   You may shave your face before/day of surgery.  Place clean sheets on your bed the night before surgery Use a clean washcloth (not used since being washed) for each shower. DO NOT sleep with pet's night before surgery.  CHG Shower Instructions:  Wash your face and private area with normal soap. If you choose to wash your hair, wash first with your normal shampoo.  After you use shampoo/soap, rinse your hair and body thoroughly to remove shampoo/soap residue.  Turn the water OFF and apply half the bottle of CHG soap to a CLEAN washcloth.  Apply CHG soap ONLY FROM YOUR NECK DOWN TO YOUR TOES (washing for 3-5 minutes)  DO NOT use CHG soap on face, private areas, open wounds, or sores.  Pay special attention to the area where your surgery is being performed.  If you are having back surgery, having someone wash your back for you may be helpful. Wait 2 minutes after CHG soap is applied, then you may rinse off the CHG soap.  Pat dry with a clean towel  Put on clean pajamas    Additional instructions for the day of surgery: DO NOT APPLY any lotions, deodorants, cologne, or perfumes.   Do not wear jewelry or makeup Do not wear nail polish, gel polish, artificial nails, or any other type of covering on natural nails (fingers and toes) Do not bring valuables to the hospital. [facility] is not responsible for valuables/personal belongings. Put on clean/comfortable clothes.  Please brush your teeth.  Ask your nurse before applying any prescription medications to the skin.

## 2023-11-11 ENCOUNTER — Encounter (HOSPITAL_COMMUNITY)
Admission: RE | Admit: 2023-11-11 | Discharge: 2023-11-11 | Disposition: A | Discharge status: Home / Self Care | Payer: Medicare Other | Source: Ambulatory Visit | Attending: Surgery | Admitting: Surgery

## 2023-11-11 ENCOUNTER — Encounter (HOSPITAL_COMMUNITY): Payer: Self-pay

## 2023-11-11 ENCOUNTER — Other Ambulatory Visit: Payer: Self-pay

## 2023-11-11 VITALS — BP 156/59 | HR 61 | Temp 98.4°F | Resp 20 | Ht 62.0 in | Wt 237.1 lb

## 2023-11-11 DIAGNOSIS — M069 Rheumatoid arthritis, unspecified: Secondary | ICD-10-CM | POA: Diagnosis not present

## 2023-11-11 DIAGNOSIS — I491 Atrial premature depolarization: Secondary | ICD-10-CM | POA: Insufficient documentation

## 2023-11-11 DIAGNOSIS — I10 Essential (primary) hypertension: Secondary | ICD-10-CM | POA: Insufficient documentation

## 2023-11-11 DIAGNOSIS — I6521 Occlusion and stenosis of right carotid artery: Secondary | ICD-10-CM | POA: Diagnosis not present

## 2023-11-11 DIAGNOSIS — I700 Atherosclerosis of aorta: Secondary | ICD-10-CM | POA: Insufficient documentation

## 2023-11-11 DIAGNOSIS — K4090 Unilateral inguinal hernia, without obstruction or gangrene, not specified as recurrent: Secondary | ICD-10-CM | POA: Diagnosis not present

## 2023-11-11 DIAGNOSIS — E7800 Pure hypercholesterolemia, unspecified: Secondary | ICD-10-CM | POA: Insufficient documentation

## 2023-11-11 DIAGNOSIS — I4891 Unspecified atrial fibrillation: Secondary | ICD-10-CM | POA: Insufficient documentation

## 2023-11-11 DIAGNOSIS — I350 Nonrheumatic aortic (valve) stenosis: Secondary | ICD-10-CM | POA: Diagnosis not present

## 2023-11-11 DIAGNOSIS — Z01818 Encounter for other preprocedural examination: Secondary | ICD-10-CM

## 2023-11-11 DIAGNOSIS — I6502 Occlusion and stenosis of left vertebral artery: Secondary | ICD-10-CM | POA: Insufficient documentation

## 2023-11-11 DIAGNOSIS — G4733 Obstructive sleep apnea (adult) (pediatric): Secondary | ICD-10-CM | POA: Diagnosis not present

## 2023-11-11 DIAGNOSIS — Z01812 Encounter for preprocedural laboratory examination: Secondary | ICD-10-CM | POA: Diagnosis not present

## 2023-11-11 DIAGNOSIS — K5730 Diverticulosis of large intestine without perforation or abscess without bleeding: Secondary | ICD-10-CM | POA: Insufficient documentation

## 2023-11-11 HISTORY — DX: Sleep apnea, unspecified: G47.30

## 2023-11-11 LAB — BASIC METABOLIC PANEL
Anion gap: 10 (ref 5–15)
BUN: 11 mg/dL (ref 8–23)
CO2: 30 mmol/L (ref 22–32)
Calcium: 9.5 mg/dL (ref 8.9–10.3)
Chloride: 101 mmol/L (ref 98–111)
Creatinine, Ser: 0.59 mg/dL (ref 0.44–1.00)
GFR, Estimated: >60 mL/min (ref >60)
Glucose, Bld: 98 mg/dL (ref 70–99)
Potassium: 4.7 mmol/L (ref 3.5–5.1)
Sodium: 141 mmol/L (ref 135–145)

## 2023-11-11 LAB — CBC
HCT: 38.3 % (ref 36.0–46.0)
Hemoglobin: 11.4 g/dL — ABNORMAL LOW (ref 12.0–15.0)
MCH: 19.9 pg — ABNORMAL LOW (ref 26.0–34.0)
MCHC: 29.8 g/dL — ABNORMAL LOW (ref 30.0–36.0)
MCV: 67 fL — ABNORMAL LOW (ref 80.0–100.0)
Platelets: 283 10*3/uL (ref 150–400)
RBC: 5.72 MIL/uL — ABNORMAL HIGH (ref 3.87–5.11)
RDW: 17.9 % — ABNORMAL HIGH (ref 11.5–15.5)
WBC: 4.8 10*3/uL (ref 4.0–10.5)
nRBC: 0 % (ref 0.0–0.2)

## 2023-11-11 NOTE — Progress Notes (Signed)
 PCP - [facility] Cleotilde Planas, MD  Cardiologist -    Dorn Lesches, MD   PPM/ICD - denies Device Orders - n/a Rep Notified - n/a  Chest x-ray - 02-28-23 (1 view) EKG - 10-07-23 Stress Test - 20 +years ago ECHO - 11-04-23 Cardiac Cath -   Sleep Study - per patient Aug. Or Sept. 2024 CPAP - uses nightly  DM -denies  Blood Thinner Instructions:Elqiuis hold 2 days prior to procedure last dose 11-14-23 Aspirin  Instructions: last dose 11-11-23  ERAS Protcol - clear liquids until 7:30 PRE-SURGERY Ensure   COVID TEST- n/a   Anesthesia review: yes cardiac history  Patient denies shortness of breath, fever, cough and chest pain at PAT appointment   All instructions explained to the patient, with a verbal understanding of the material. Patient agrees to go over the instructions while at home for a better understanding. Patient also instructed to self quarantine after being tested for COVID-19. The opportunity to ask questions was provided.

## 2023-11-12 NOTE — Anesthesia Preprocedure Evaluation (Addendum)
Anesthesia Evaluation  Patient identified by MRN, date of birth, ID band Patient awake    Reviewed: Allergy & Precautions, NPO status , Patient's Chart, lab work & pertinent test results  History of Anesthesia Complications Negative for: history of anesthetic complications  Airway Mallampati: III  TM Distance: >3 FB Neck ROM: Full    Dental  (+) Dental Advisory Given, Partial Lower, Upper Dentures   Pulmonary sleep apnea , former smoker   Pulmonary exam normal        Cardiovascular hypertension, Pt. on medications + Peripheral Vascular Disease  Normal cardiovascular exam+ dysrhythmias Atrial Fibrillation + Valvular Problems/Murmurs AS    '24 Carotid US - 80-99% right ICAS, 1-39% left ICAS  '24 TTE - EF 60 to 65%. There is mild left ventricular hypertrophy. Grade I diastolic dysfunction (impaired relaxation). Left atrial size was moderately dilated. Mild mitral valve regurgitation. Moderate aortic valve stenosis.      Neuro/Psych  PSYCHIATRIC DISORDERS Anxiety     negative neurological ROS     GI/Hepatic negative GI ROS, Neg liver ROS,,,  Endo/Other    Class 3 obesity  Renal/GU negative Renal ROS     Musculoskeletal  (+) Arthritis , Rheumatoid disorders,    Abdominal  (+) + obese  Peds  Hematology  (+) Blood dyscrasia, anemia  On eliquis    Anesthesia Other Findings   Reproductive/Obstetrics                             Anesthesia Physical Anesthesia Plan  ASA: 3  Anesthesia Plan: General   Post-op Pain Management: Regional block* and Tylenol PO (pre-op)*   Induction: Intravenous  PONV Risk Score and Plan: 3 and Treatment may vary due to age or medical condition, Ondansetron, Midazolam and Dexamethasone  Airway Management Planned: Oral ETT  Additional Equipment:   Intra-op Plan:   Post-operative Plan: Extubation in OR  Informed Consent: I have reviewed the patients  History and Physical, chart, labs and discussed the procedure including the risks, benefits and alternatives for the proposed anesthesia with the patient or authorized representative who has indicated his/her understanding and acceptance.     Dental advisory given  Plan Discussed with: CRNA and Anesthesiologist  Anesthesia Plan Comments: (PAT note written 11/12/2023 by Shonna Chock, PA-C.  )       Anesthesia Quick Evaluation
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
## 2023-11-15 ENCOUNTER — Other Ambulatory Visit: Payer: Self-pay

## 2023-11-15 MED ORDER — DOFETILIDE 500 MCG PO CAPS: 500.0000 ug | ORAL_CAPSULE | Freq: Every day | ORAL | 3 refills | Status: DC

## 2023-11-16 ENCOUNTER — Telehealth: Payer: Self-pay

## 2023-11-16 ENCOUNTER — Other Ambulatory Visit: Payer: Self-pay

## 2023-11-16 MED ORDER — DOFETILIDE 500 MCG PO CAPS: 500.0000 ug | ORAL_CAPSULE | Freq: Two times a day (BID) | ORAL | 3 refills | Status: DC

## 2023-11-16 NOTE — Telephone Encounter (Signed)
Spoke with pt. Pt was notified of ehco results. Pt will continue current medications and f/u as planned.

## 2023-11-17 ENCOUNTER — Other Ambulatory Visit: Payer: Self-pay

## 2023-11-17 ENCOUNTER — Encounter (HOSPITAL_COMMUNITY)
Admission: RE | Disposition: A | Discharge status: Home / Self Care | Payer: Self-pay | Source: Home / Self Care | Attending: Surgery

## 2023-11-17 ENCOUNTER — Encounter (HOSPITAL_COMMUNITY): Payer: Self-pay | Admitting: Surgery

## 2023-11-17 ENCOUNTER — Ambulatory Visit (HOSPITAL_COMMUNITY): Payer: Medicare Other | Admitting: Anesthesiology

## 2023-11-17 ENCOUNTER — Ambulatory Visit (HOSPITAL_COMMUNITY): Payer: Medicare Other | Admitting: Vascular Surgery

## 2023-11-17 ENCOUNTER — Observation Stay (HOSPITAL_COMMUNITY)
Admission: RE | Admit: 2023-11-17 | Discharge: 2023-11-18 | Disposition: A | Discharge status: Home / Self Care | Payer: Medicare Other | Attending: Surgery | Admitting: Surgery

## 2023-11-17 DIAGNOSIS — I4819 Other persistent atrial fibrillation: Secondary | ICD-10-CM | POA: Insufficient documentation

## 2023-11-17 DIAGNOSIS — I10 Essential (primary) hypertension: Secondary | ICD-10-CM

## 2023-11-17 DIAGNOSIS — E785 Hyperlipidemia, unspecified: Secondary | ICD-10-CM

## 2023-11-17 DIAGNOSIS — Z8673 Personal history of transient ischemic attack (TIA), and cerebral infarction without residual deficits: Secondary | ICD-10-CM | POA: Insufficient documentation

## 2023-11-17 DIAGNOSIS — Z7901 Long term (current) use of anticoagulants: Secondary | ICD-10-CM | POA: Diagnosis not present

## 2023-11-17 DIAGNOSIS — K4090 Unilateral inguinal hernia, without obstruction or gangrene, not specified as recurrent: Secondary | ICD-10-CM | POA: Diagnosis not present

## 2023-11-17 DIAGNOSIS — G8918 Other acute postprocedural pain: Secondary | ICD-10-CM | POA: Diagnosis not present

## 2023-11-17 DIAGNOSIS — Z87891 Personal history of nicotine dependence: Secondary | ICD-10-CM | POA: Diagnosis not present

## 2023-11-17 DIAGNOSIS — Z79899 Other long term (current) drug therapy: Secondary | ICD-10-CM | POA: Insufficient documentation

## 2023-11-17 DIAGNOSIS — Z7982 Long term (current) use of aspirin: Secondary | ICD-10-CM | POA: Insufficient documentation

## 2023-11-17 DIAGNOSIS — I4891 Unspecified atrial fibrillation: Secondary | ICD-10-CM | POA: Diagnosis not present

## 2023-11-17 HISTORY — PX: INGUINAL HERNIA REPAIR: SHX194

## 2023-11-17 HISTORY — PX: INSERTION OF MESH: SHX5868

## 2023-11-17 SURGERY — REPAIR, HERNIA, INGUINAL, ADULT
Anesthesia: General | Site: Groin | Laterality: Right

## 2023-11-17 MED ORDER — CHLORHEXIDINE GLUCONATE CLOTH 2 % EX PADS: 6.0000 | MEDICATED_PAD | Freq: Once | CUTANEOUS | Status: DC

## 2023-11-17 MED ORDER — HYDROMORPHONE HCL 1 MG/ML IJ SOLN
0.2500 mg | INTRAMUSCULAR | Status: DC | PRN
  Administered 2023-11-17 (×2): 0.5 mg via INTRAVENOUS

## 2023-11-17 MED ORDER — DEXAMETHASONE SODIUM PHOSPHATE 10 MG/ML IJ SOLN
INTRAMUSCULAR | Status: AC
  Filled 2023-11-17: qty 1

## 2023-11-17 MED ORDER — LIDOCAINE 2% (20 MG/ML) 5 ML SYRINGE
INTRAMUSCULAR | Status: AC
  Filled 2023-11-17: qty 5

## 2023-11-17 MED ORDER — ONDANSETRON HCL 4 MG/2ML IJ SOLN
INTRAMUSCULAR | Status: DC | PRN
  Administered 2023-11-17: 4 mg via INTRAVENOUS

## 2023-11-17 MED ORDER — FENTANYL CITRATE (PF) 250 MCG/5ML IJ SOLN
INTRAMUSCULAR | Status: AC
  Filled 2023-11-17: qty 5

## 2023-11-17 MED ORDER — EPHEDRINE SULFATE-NACL 50-0.9 MG/10ML-% IV SOSY
PREFILLED_SYRINGE | INTRAVENOUS | Status: DC | PRN
  Administered 2023-11-17 (×3): 5 mg via INTRAVENOUS

## 2023-11-17 MED ORDER — LIDOCAINE 2% (20 MG/ML) 5 ML SYRINGE
INTRAMUSCULAR | Status: DC | PRN
  Administered 2023-11-17: 40 mg via INTRAVENOUS

## 2023-11-17 MED ORDER — 0.9 % SODIUM CHLORIDE (POUR BTL) OPTIME
TOPICAL | Status: DC | PRN
  Administered 2023-11-17: 1000 mL

## 2023-11-17 MED ORDER — ROPIVACAINE HCL 5 MG/ML IJ SOLN
INTRAMUSCULAR | Status: DC | PRN
  Administered 2023-11-17: 30 mL via PERINEURAL

## 2023-11-17 MED ORDER — CHLORHEXIDINE GLUCONATE 0.12 % MT SOLN
15.0000 mL | Freq: Once | OROMUCOSAL | Status: AC
  Administered 2023-11-17: 15 mL via OROMUCOSAL
  Filled 2023-11-17: qty 15

## 2023-11-17 MED ORDER — ONDANSETRON HCL 4 MG/2ML IJ SOLN
INTRAMUSCULAR | Status: AC
  Filled 2023-11-17: qty 2

## 2023-11-17 MED ORDER — ACETAMINOPHEN 500 MG PO TABS
1000.0000 mg | ORAL_TABLET | Freq: Four times a day (QID) | ORAL | Status: DC
  Administered 2023-11-17 – 2023-11-18 (×3): 1000 mg via ORAL
  Filled 2023-11-17 (×3): qty 2

## 2023-11-17 MED ORDER — ROCURONIUM BROMIDE 10 MG/ML (PF) SYRINGE
PREFILLED_SYRINGE | INTRAVENOUS | Status: AC
  Filled 2023-11-17: qty 10

## 2023-11-17 MED ORDER — METHOCARBAMOL 500 MG PO TABS
ORAL_TABLET | ORAL | Status: AC
  Filled 2023-11-17: qty 1

## 2023-11-17 MED ORDER — ORAL CARE MOUTH RINSE: 15.0000 mL | Freq: Once | OROMUCOSAL | Status: AC

## 2023-11-17 MED ORDER — OXYCODONE HCL 5 MG PO TABS
ORAL_TABLET | ORAL | Status: AC
  Filled 2023-11-17: qty 1

## 2023-11-17 MED ORDER — FENTANYL CITRATE (PF) 100 MCG/2ML IJ SOLN
INTRAMUSCULAR | Status: AC
Start: 2023-11-17 — End: 2023-11-17
  Administered 2023-11-17: 50 ug via INTRAVENOUS
  Filled 2023-11-17: qty 2

## 2023-11-17 MED ORDER — OXYCODONE HCL 5 MG/5ML PO SOLN: 5.0000 mg | Freq: Once | ORAL | Status: AC | PRN

## 2023-11-17 MED ORDER — DOCUSATE SODIUM 100 MG PO CAPS
100.0000 mg | ORAL_CAPSULE | Freq: Two times a day (BID) | ORAL | Status: DC
  Administered 2023-11-17 – 2023-11-18 (×2): 100 mg via ORAL
  Filled 2023-11-17 (×2): qty 1

## 2023-11-17 MED ORDER — LACTATED RINGERS IV SOLN: INTRAVENOUS | Status: DC | PRN

## 2023-11-17 MED ORDER — ONDANSETRON 4 MG PO TBDP: 4.0000 mg | ORAL_TABLET | Freq: Four times a day (QID) | ORAL | Status: DC | PRN

## 2023-11-17 MED ORDER — LOSARTAN POTASSIUM 50 MG PO TABS
100.0000 mg | ORAL_TABLET | Freq: Every morning | ORAL | Status: DC
  Administered 2023-11-18: 100 mg via ORAL
  Filled 2023-11-17: qty 2

## 2023-11-17 MED ORDER — PROPOFOL 10 MG/ML IV BOLUS
INTRAVENOUS | Status: DC | PRN
  Administered 2023-11-17: 120 mg via INTRAVENOUS

## 2023-11-17 MED ORDER — SUGAMMADEX SODIUM 200 MG/2ML IV SOLN
INTRAVENOUS | Status: DC | PRN
  Administered 2023-11-17: 200 mg via INTRAVENOUS

## 2023-11-17 MED ORDER — SODIUM CHLORIDE 0.9 % IV SOLN: 12.5000 mg | INTRAVENOUS | Status: DC | PRN

## 2023-11-17 MED ORDER — AMISULPRIDE (ANTIEMETIC) 5 MG/2ML IV SOLN: 10.0000 mg | Freq: Once | INTRAVENOUS | Status: DC | PRN

## 2023-11-17 MED ORDER — PHENYLEPHRINE 80 MCG/ML (10ML) SYRINGE FOR IV PUSH (FOR BLOOD PRESSURE SUPPORT)
PREFILLED_SYRINGE | INTRAVENOUS | Status: DC | PRN
  Administered 2023-11-17 (×2): 80 ug via INTRAVENOUS

## 2023-11-17 MED ORDER — DIPHENHYDRAMINE HCL 12.5 MG/5ML PO ELIX: 12.5000 mg | ORAL_SOLUTION | Freq: Four times a day (QID) | ORAL | Status: DC | PRN

## 2023-11-17 MED ORDER — ROCURONIUM BROMIDE 10 MG/ML (PF) SYRINGE
PREFILLED_SYRINGE | INTRAVENOUS | Status: DC | PRN
  Administered 2023-11-17: 50 mg via INTRAVENOUS

## 2023-11-17 MED ORDER — OXYCODONE HCL 5 MG PO TABS
5.0000 mg | ORAL_TABLET | Freq: Once | ORAL | Status: AC | PRN
  Administered 2023-11-17: 5 mg via ORAL

## 2023-11-17 MED ORDER — PROPOFOL 10 MG/ML IV BOLUS
INTRAVENOUS | Status: AC
  Filled 2023-11-17: qty 20

## 2023-11-17 MED ORDER — OXYCODONE HCL 5 MG PO TABS
5.0000 mg | ORAL_TABLET | ORAL | Status: DC | PRN
  Administered 2023-11-17 – 2023-11-18 (×3): 5 mg via ORAL
  Filled 2023-11-17 (×3): qty 1

## 2023-11-17 MED ORDER — BUPIVACAINE-EPINEPHRINE (PF) 0.25% -1:200000 IJ SOLN
INTRAMUSCULAR | Status: AC
  Filled 2023-11-17: qty 30

## 2023-11-17 MED ORDER — HYDROMORPHONE HCL 1 MG/ML IJ SOLN
INTRAMUSCULAR | Status: AC
  Filled 2023-11-17: qty 1

## 2023-11-17 MED ORDER — DORZOLAMIDE HCL-TIMOLOL MAL 2-0.5 % OP SOLN
1.0000 [drp] | Freq: Two times a day (BID) | OPHTHALMIC | Status: DC
  Administered 2023-11-17 – 2023-11-18 (×2): 1 [drp] via OPHTHALMIC
  Filled 2023-11-17: qty 10

## 2023-11-17 MED ORDER — FENTANYL CITRATE (PF) 250 MCG/5ML IJ SOLN
INTRAMUSCULAR | Status: DC | PRN
  Administered 2023-11-17: 50 ug via INTRAVENOUS
  Administered 2023-11-17 (×2): 25 ug via INTRAVENOUS
  Administered 2023-11-17: 50 ug via INTRAVENOUS

## 2023-11-17 MED ORDER — ONDANSETRON HCL 4 MG/2ML IJ SOLN: 4.0000 mg | Freq: Four times a day (QID) | INTRAMUSCULAR | Status: DC | PRN

## 2023-11-17 MED ORDER — VANCOMYCIN HCL 1500 MG/300ML IV SOLN
1500.0000 mg | INTRAVENOUS | Status: AC
Start: 2023-11-17 — End: 2023-11-18
  Administered 2023-11-17: 1500 mg via INTRAVENOUS
  Filled 2023-11-17: qty 300

## 2023-11-17 MED ORDER — MIDAZOLAM HCL 2 MG/2ML IJ SOLN: 2.0000 mg | Freq: Once | INTRAMUSCULAR | Status: AC

## 2023-11-17 MED ORDER — METHOCARBAMOL 500 MG PO TABS
500.0000 mg | ORAL_TABLET | Freq: Four times a day (QID) | ORAL | Status: DC | PRN
  Administered 2023-11-17: 500 mg via ORAL

## 2023-11-17 MED ORDER — DOFETILIDE 500 MCG PO CAPS
500.0000 ug | ORAL_CAPSULE | Freq: Two times a day (BID) | ORAL | Status: DC
  Administered 2023-11-17 – 2023-11-18 (×2): 500 ug via ORAL
  Filled 2023-11-17 (×2): qty 1

## 2023-11-17 MED ORDER — DEXAMETHASONE SODIUM PHOSPHATE 10 MG/ML IJ SOLN
INTRAMUSCULAR | Status: DC | PRN
  Administered 2023-11-17: 10 mg via INTRAVENOUS

## 2023-11-17 MED ORDER — FUROSEMIDE 40 MG PO TABS
40.0000 mg | ORAL_TABLET | Freq: Every day | ORAL | Status: DC
Start: 2023-11-17 — End: 2023-11-18
  Administered 2023-11-17 – 2023-11-18 (×2): 40 mg via ORAL
  Filled 2023-11-17 (×2): qty 1

## 2023-11-17 MED ORDER — ATORVASTATIN CALCIUM 10 MG PO TABS
20.0000 mg | ORAL_TABLET | Freq: Every morning | ORAL | Status: DC
Start: 2023-11-18 — End: 2023-11-18
  Administered 2023-11-18: 20 mg via ORAL
  Filled 2023-11-17: qty 2

## 2023-11-17 MED ORDER — ACETAMINOPHEN 500 MG PO TABS
1000.0000 mg | ORAL_TABLET | ORAL | Status: AC
  Administered 2023-11-17: 1000 mg via ORAL
  Filled 2023-11-17: qty 2

## 2023-11-17 MED ORDER — FENTANYL CITRATE (PF) 100 MCG/2ML IJ SOLN: 50.0000 ug | Freq: Once | INTRAMUSCULAR | Status: AC

## 2023-11-17 MED ORDER — TRAMADOL HCL 50 MG PO TABS: 50.0000 mg | ORAL_TABLET | Freq: Four times a day (QID) | ORAL | Status: DC | PRN

## 2023-11-17 MED ORDER — MORPHINE SULFATE (PF) 4 MG/ML IV SOLN: 4.0000 mg | INTRAVENOUS | Status: DC | PRN

## 2023-11-17 MED ORDER — DIPHENHYDRAMINE HCL 50 MG/ML IJ SOLN: 12.5000 mg | Freq: Four times a day (QID) | INTRAMUSCULAR | Status: DC | PRN

## 2023-11-17 MED ORDER — EPHEDRINE 5 MG/ML INJ
INTRAVENOUS | Status: AC
  Filled 2023-11-17: qty 5

## 2023-11-17 MED ORDER — MIDAZOLAM HCL 2 MG/2ML IJ SOLN
INTRAMUSCULAR | Status: AC
  Administered 2023-11-17: 2 mg via INTRAVENOUS
  Filled 2023-11-17: qty 2

## 2023-11-17 SURGICAL SUPPLY — 35 items
BENZOIN TINCTURE PRP APPL 2/3 (GAUZE/BANDAGES/DRESSINGS) ×1 IMPLANT
BLADE CLIPPER SURG (BLADE) IMPLANT
CHLORAPREP W/TINT 26 (MISCELLANEOUS) ×1 IMPLANT
COVER SURGICAL LIGHT HANDLE (MISCELLANEOUS) ×1 IMPLANT
DRAPE LAPAROTOMY TRNSV 102X78 (DRAPES) IMPLANT
DRSG TEGADERM 4X4.5 CHG (GAUZE/BANDAGES/DRESSINGS) IMPLANT
DRSG TEGADERM 4X4.75 (GAUZE/BANDAGES/DRESSINGS) ×1 IMPLANT
ELECT CAUTERY BLADE 6.4 (BLADE) IMPLANT
ELECT REM PT RETURN 9FT ADLT (ELECTROSURGICAL) ×1
ELECTRODE REM PT RTRN 9FT ADLT (ELECTROSURGICAL) ×1 IMPLANT
GAUZE 4X4 16PLY ~~LOC~~+RFID DBL (SPONGE) ×1 IMPLANT
GAUZE SPONGE 4X4 12PLY STRL (GAUZE/BANDAGES/DRESSINGS) ×1 IMPLANT
GLOVE BIO SURGEON STRL SZ7 (GLOVE) ×1 IMPLANT
GLOVE BIOGEL PI IND STRL 7.5 (GLOVE) ×1 IMPLANT
GOWN STRL REUS W/ TWL LRG LVL3 (GOWN DISPOSABLE) ×2 IMPLANT
KIT BASIN OR (CUSTOM PROCEDURE TRAY) ×1 IMPLANT
KIT TURNOVER KIT B (KITS) ×1 IMPLANT
MESH ULTRAPRO 3X6 7.6X15CM (Mesh General) IMPLANT
NDL HYPO 25GX1X1/2 BEV (NEEDLE) ×1 IMPLANT
NEEDLE HYPO 25GX1X1/2 BEV (NEEDLE) ×1
NS IRRIG 1000ML POUR BTL (IV SOLUTION) ×1 IMPLANT
PACK GENERAL/GYN (CUSTOM PROCEDURE TRAY) ×1 IMPLANT
PAD ARMBOARD 7.5X6 YLW CONV (MISCELLANEOUS) ×1 IMPLANT
SPONGE INTESTINAL PEANUT (DISPOSABLE) ×1 IMPLANT
STRIP CLOSURE SKIN 1/2X4 (GAUZE/BANDAGES/DRESSINGS) ×1 IMPLANT
SUT MNCRL AB 4-0 PS2 18 (SUTURE) ×1 IMPLANT
SUT PDS AB 0 CT 36 (SUTURE) IMPLANT
SUT SILK 2 0 SH (SUTURE) IMPLANT
SUT SILK 3-0 18XBRD TIE 12 (SUTURE) IMPLANT
SUT VIC AB 0 CT2 27 (SUTURE) ×1 IMPLANT
SUT VIC AB 2-0 SH 27X BRD (SUTURE) ×1 IMPLANT
SUT VIC AB 3-0 SH 27XBRD (SUTURE) ×1 IMPLANT
SYR CONTROL 10ML LL (SYRINGE) ×1 IMPLANT
TOWEL GREEN STERILE (TOWEL DISPOSABLE) ×1 IMPLANT
TOWEL GREEN STERILE FF (TOWEL DISPOSABLE) ×1 IMPLANT

## 2023-11-17 NOTE — H&P (Signed)
Subjective    Chief Complaint: New Consultation (Right inguinal hernia)       History of Present Illness: Kristina [name] is a 78 y.o. female who is seen today as an office consultation at the request of Dr. Hyacinth Meeker for evaluation of New Consultation (Right inguinal hernia) .   This is a 78 year old female with multiple medical issues including chronic atrial fibrillation on Eliquis and aspirin who presents with a palpable bulge in her right groin.  She noticed this about 3 months ago.  It has not enlarged since she first noticed it.  Minimal discomfort.  No obstructive symptoms.  She brought this to the attention of Dr. Hyacinth Meeker who obtained a CT scan.  This showed a right inguinal hernia containing only fat.  Scan showed some stranding around the hernia sac so there was a question of strangulation but the patient has had no symptoms in this area.  She has had a complicated last few months.  On 08/18/2023, she underwent right carotid endarterectomy by Dr. Myra Gianotti.  She was readmitted for a postoperative hematoma.  She did not require further surgery.  The swelling is starting to go down.  She has resumed her Eliquis.  She has minimal symptoms related to her right groin.   Patient is originally from [place].  Years ago she had a laparoscopic cholecystectomy.  She developed an incisional hernia at her epigastric port site.  She has had 2 repairs of this hernia.  She is not clear whether they use mesh to repair that hernia.     Review of Systems: A complete review of systems was obtained from the patient.  I have reviewed this information and discussed as appropriate with the patient.  See HPI as well for other ROS.   Review of Systems  Constitutional: Negative.   HENT:  Positive for congestion.   Eyes:  Positive for blurred vision.  Respiratory: Negative.    Cardiovascular: Negative.   Gastrointestinal: Negative.   Genitourinary: Negative.   Musculoskeletal: Negative.   Skin: Negative.    Neurological:  Positive for headaches.  Endo/Heme/Allergies:  Bruises/bleeds easily.  Psychiatric/Behavioral: Negative.          Medical History: Past Medical History         Past Medical History:  Diagnosis Date   Hypertension     Sleep apnea          Problem List       Patient Active Problem List  Diagnosis   Anxiety   Aortic stenosis   Beta thalassemia minor   Carotid artery disease (CMS-HCC)   Class 3 severe obesity with serious comorbidity and body mass index (BMI) of 40.0 to 44.9 in adult (CMS/HHS-HCC)   Degenerative joint disease involving multiple joints   Endometrial polyp   Hypercoagulable state due to persistent atrial fibrillation (CMS/HHS-HCC)   Essential hypertension   Hyperlipidemia   Carotid stenosis, asymptomatic   Persistent atrial fibrillation (CMS/HHS-HCC)   Personal history of transient ischemic attack (TIA), and cerebral infarction without residual deficits   Prediabetes   Pure hypercholesterolemia   Vitamin D deficiency, unspecified        Past Surgical History  History reviewed. No pertinent surgical history.      Allergies           Allergies  Allergen Reactions   Penicillins Anaphylaxis and Itching      Did it involve swelling of the face/tongue/throat, SOB, or low BP? Yes  Did it involve sudden or severe rash/hives, skin  peeling, or any reaction on the inside of your mouth or nose? No  Did you need to seek medical attention at a hospital or doctor's office? Yes  When did it last happen?      Childhood allergy  If all above answers are "NO", may proceed with cephalosporin use.        Medications Ordered Prior to Encounter             Current Outpatient Medications on File Prior to Visit  Medication Sig Dispense Refill   apixaban (ELIQUIS) 5 mg tablet Take 5 mg by mouth       aspirin 81 MG EC tablet Take 81 mg by mouth       atorvastatin (LIPITOR) 20 MG tablet Take 20 mg by mouth every morning       dofetilide (TIKOSYN) 500 MCG  capsule Take 500 mcg by mouth       dorzolamide-timoloL (COSOPT) 22.3-6.8 mg/mL ophthalmic solution         ergocalciferol, vitamin D2, 1,250 mcg (50,000 unit) capsule Take 50,000 Units by mouth every 7 (seven) days       losartan (COZAAR) 100 MG tablet Take 100 mg by mouth once daily        No current facility-administered medications on file prior to visit.        Family History           Family History  Problem Relation Age of Onset   High blood pressure (Hypertension) Mother          Tobacco Use History  Social History           Tobacco Use  Smoking Status Former   Types: Cigarettes  Smokeless Tobacco Never        Social History  Social History             Socioeconomic History   Marital status: Single  Tobacco Use   Smoking status: Former      Types: Cigarettes   Smokeless tobacco: Never  Vaping Use   Vaping status: Never Used  Substance and Sexual Activity   Alcohol use: Yes   Drug use: Never    Social Drivers of Hydrologist Insecurity: No Food Insecurity (08/20/2023)    Received from [facility]    Hunger Vital Sign     Worried About Running Out of Food in the Last Year: Never true     Ran Out of Food in the Last Year: Never true  Transportation Needs: No Transportation Needs (08/20/2023)    Received from [facility] - Transportation     Lack of Transportation (Medical): No     Lack of Transportation (Non-Medical): No        Objective:             Vitals:    08/30/23 1048 08/30/23 1052  BP: (!) 160/90    Pulse: 84    Temp: 36.8 C (98.2 F)    SpO2: 97%    Weight: (!) 108.4 kg (239 lb)    Height: 157.5 cm (5\' 2" )    PainSc:   0-No pain    Body mass index is 43.71 kg/m.   Physical Exam    Constitutional:  WDWN in NAD, conversant, no obvious deformities; lying in bed comfortably Eyes:  Pupils equal, round; sclera anicteric; moist conjunctiva; no lid lag HENT:  Oral mucosa moist; good dentition  Neck:  No  masses palpated, trachea midline; no thyromegaly Lungs:  CTA bilaterally; normal respiratory effort CV:  Regular rate and rhythm; no murmurs; extremities well-perfused with no edema Abd:  +bowel sounds, soft, non-tender, no palpable organomegaly; palpable right inguinal hernia that is spontaneously reducible.  Healed epigastric incisions. Musc: Unsteady gait.  Ambulates with the assistance of a cane. no apparent clubbing or cyanosis in extremities Lymphatic:  No palpable cervical or axillary lymphadenopathy Skin:  Warm, dry; no sign of jaundice Psychiatric - alert and oriented x 4; calm mood and affect     Labs, Imaging and Diagnostic Testing: CLINICAL DATA:  Abdominal mass, right lower quadrant   EXAM: CT ABDOMEN AND PELVIS WITH CONTRAST   TECHNIQUE: Multidetector CT imaging of the abdomen and pelvis was performed using the standard protocol following bolus administration of intravenous contrast.   RADIATION DOSE REDUCTION: This exam was performed according to the departmental dose-optimization program which includes automated exposure control, adjustment of the mA and/or kV according to patient size and/or use of iterative reconstruction technique.   CONTRAST:  75mL OMNIPAQUE IOHEXOL 350 MG/ML SOLN   COMPARISON:  None Available.   FINDINGS: Lower chest: No acute abnormality   Hepatobiliary: No focal liver abnormality is seen. Status post cholecystectomy. No biliary dilatation.   Pancreas: No focal abnormality or ductal dilatation.   Spleen: No focal abnormality.  Normal size.   Adrenals/Urinary Tract: Areas of cortical thinning and scarring within the kidneys bilaterally. No stones or hydronephrosis. No suspicious renal or adrenal mass. Urinary bladder unremarkable.   Stomach/Bowel: Sigmoid diverticulosis. No active diverticulitis. Stomach and small bowel decompressed, unremarkable.   Vascular/Lymphatic: Aortoiliac atherosclerosis. No evidence of aneurysm or  adenopathy.   Reproductive: Uterus and adnexa unremarkable.  No mass.   Other: No free fluid or free air.   Musculoskeletal: No acute bony abnormality. There is a right inguinal hernia containing fat. There is stranding around fat within the hernia suggesting strangulation. No bowel involvement.   IMPRESSION: Right inguinal hernia containing fat with surrounding stranding suggesting strangulation. No bowel involvement.   Sigmoid diverticulosis.  No active diverticulitis.   Aortoiliac atherosclerosis.     Electronically Signed   By: Charlett Nose M.D.   On: 06/27/2023 12:15     Assessment and Plan:  Diagnoses and all orders for this visit:   Right inguinal hernia     Recommend cardiac clearance followed by right inguinal hernia repair with mesh.  The patient wants to wait a few weeks for surgery due to her recent carotid endarterectomy and the postoperative course.  I think this is reasonable as the patient has minimal symptoms.  No sign of incarceration or strangulation at this time.   Right inguinal hernia repair with mesh.  The surgical procedure has been discussed with the patient.  Potential risks, benefits, alternative treatments, and expected outcomes have been explained.  All of the patient's questions at this time have been answered.  The likelihood of reaching the patient's treatment goal is good.  The patient understands the proposed surgical procedure and wishes to proceed.    [name]. Corliss Skains, MD, [facility]  General Surgery   11/17/2023 8:28 AM

## 2023-11-17 NOTE — Op Note (Signed)
Right inguinal hernia repair with mesh (open) Procedure Note  Indications: This is a 78 year old female with multiple medical issues including chronic atrial fibrillation on Eliquis and aspirin who presents with a palpable bulge in her right groin. She noticed this about 3 months ago. It has not enlarged since she first noticed it. Minimal discomfort. No obstructive symptoms. She brought this to the attention of Dr. Hyacinth Meeker who obtained a CT scan. This showed a right inguinal hernia containing only fat. Scan showed some stranding around the hernia sac so there was a question of strangulation but the patient has had no symptoms in this area.   Pre-operative Diagnosis: right reducible inguinal hernia Post-operative Diagnosis: same  Surgeon: Wynona Luna   Assistants: None  Anesthesia: General LMA anesthesia and TAP block  ASA Class: 2  Procedure Details  The patient was seen again in the Holding Room. The risks, benefits, complications, treatment options, and expected outcomes were discussed with the patient. The possibilities of reaction to medication, pulmonary aspiration, perforation of viscus, bleeding, recurrent infection, the need for additional procedures, and development of a complication requiring transfusion or further operation were discussed with the patient and/or family. The likelihood of success in repairing the hernia and returning the patient to their previous functional status is good.  There was concurrence with the proposed plan, and informed consent was obtained. The site of surgery was properly noted/marked. The patient was taken to the Operating Room, identified as Kristina [name], and the procedure verified as right inguinal hernia repair. A Time Out was held and the above information confirmed.  The patient was placed in the supine position and underwent induction of anesthesia. The lower abdomen and groin was prepped with Chloraprep and draped in the standard fashion,  and 0.25% Marcaine with epinephrine was used to anesthetize the skin over the mid-portion of the right inguinal canal. An oblique incision was made. Dissection was carried down through the subcutaneous tissue with cautery to the external oblique fascia.  We opened the external oblique fascia along the direction of its fibers to the pubic tubercle. The floor of the inguinal canal was inspected and revealed a 3 cm direct defect.  The round ligament was ligated and divided.  The direct hernia was completely reduced.  The direct defect was closed with a running 0 PDS.  Ultrapro mesh was cut into an oval shape which was inserted and deployed across the floor of the inguinal canal. The mesh was tucked underneath the external oblique fascia laterally. The mesh was secured to the pubic tubercle with 0 Vicryl.  Multiple additional stay sutures were used to attach the mesh to the shelving edge inferiorly and the internal oblique fascia superiorly. The external oblique fascia was reapproximated with 2-0 Vicryl.  3-0 Vicryl was used to close the subcutaneous tissues and 4-0 Monocryl was used to close the skin in subcuticular fashion.  Benzoin and steri-strips were used to seal the incision.  A clean dressing was applied.  The patient was then extubated and brought to the recovery room in stable condition.  All sponge, instrument, and needle counts were correct prior to closure and at the conclusion of the case.   Estimated Blood Loss: Minimal                 Complications: None; patient tolerated the procedure well.         Disposition: PACU - hemodynamically stable.         Condition: stable  [name]. [name],  MD, [facility]  General Surgery   11/17/2023 11:28 AM

## 2023-11-17 NOTE — Transfer of Care (Signed)
Immediate Anesthesia Transfer of Care Note  Patient: Kristina [name]  Procedure(s) Performed: RIGHT INGUINAL HERNIA REPAIR (Right: Groin) INSERTION OF MESH (Right: Groin)  Patient Location: PACU  Anesthesia Type:General  Level of Consciousness: awake, alert , oriented, and patient cooperative  Airway & Oxygen Therapy: Patient Spontanous Breathing and Patient connected to nasal cannula oxygen  Post-op Assessment: Report given to RN and Post -op Vital signs reviewed and stable  Post vital signs: Reviewed and stable  Last Vitals:  Vitals Value Taken Time  BP 124/54 11/17/23 1130  Temp 36.5 C 11/17/23 1130  Pulse 46 11/17/23 1136  Resp 18 11/17/23 1135  SpO2 100 % 11/17/23 1136  Vitals shown include unfiled device data.  Last Pain:  Vitals:   11/17/23 1130  TempSrc:   PainSc: 6       Patients Stated Pain Goal: 3 (11/17/23 1130)  Complications: No notable events documented.

## 2023-11-17 NOTE — Anesthesia Procedure Notes (Signed)
Procedure Name: Intubation Date/Time: 11/17/2023 10:26 AM  Performed by: Ammie Dalton, CRNAPre-anesthesia Checklist: Patient identified, Emergency Drugs available, Suction available and Patient being monitored Patient Re-evaluated:Patient Re-evaluated prior to induction Oxygen Delivery Method: Circle System Utilized Preoxygenation: Pre-oxygenation with 100% oxygen Induction Type: IV induction Ventilation: Mask ventilation without difficulty Laryngoscope Size: Mac and 3 Grade View: Grade I Tube type: Oral Number of attempts: 1 Airway Equipment and Method: Stylet and Oral airway Placement Confirmation: ETT inserted through vocal cords under direct vision, positive ETCO2 and breath sounds checked- equal and bilateral Secured at: 21 cm Tube secured with: Tape Dental Injury: Teeth and Oropharynx as per pre-operative assessment

## 2023-11-17 NOTE — Anesthesia Postprocedure Evaluation (Signed)
Anesthesia Post Note  Patient: Kristina [name]  Procedure(s) Performed: RIGHT INGUINAL HERNIA REPAIR (Right: Groin) INSERTION OF MESH (Right: Groin)     Patient location during evaluation: PACU Anesthesia Type: General Level of consciousness: awake and alert Pain management: pain level controlled Vital Signs Assessment: post-procedure vital signs reviewed and stable Respiratory status: spontaneous breathing, nonlabored ventilation and respiratory function stable Cardiovascular status: blood pressure returned to baseline and stable Postop Assessment: no apparent nausea or vomiting Anesthetic complications: no   No notable events documented.  Last Vitals:  Vitals:   11/17/23 1215 11/17/23 1230  BP: 138/67 (!) 153/59  Pulse: (!) 48 (!) 49  Resp: 15 11  Temp:    SpO2: 100% 99%    Last Pain:  Vitals:   11/17/23 1315  TempSrc:   PainSc: 7                  Lowella Curb

## 2023-11-17 NOTE — Anesthesia Procedure Notes (Signed)
Anesthesia Regional Block: TAP block   Pre-Anesthetic Checklist: , timeout performed,  Correct Patient, Correct Site, Correct Laterality,  Correct Procedure, Correct Position, site marked,  Risks and benefits discussed,  Surgical consent,  Pre-op evaluation,  At surgeon's request and post-op pain management  Laterality: Right  Prep: chloraprep       Needles:  Injection technique: Single-shot  Needle Type: Stimiplex     Needle Length: 9cm  Needle Gauge: 21     Additional Needles:   Procedures:,,,, ultrasound used (permanent image in chart),,    Narrative:  Start time: 11/17/2023 9:52 AM End time: 11/17/2023 9:57 AM Injection made incrementally with aspirations every 5 mL.  Performed by: Personally  Anesthesiologist: Lowella Curb, MD

## 2023-11-18 ENCOUNTER — Encounter (HOSPITAL_COMMUNITY): Payer: Self-pay | Admitting: Surgery

## 2023-11-18 DIAGNOSIS — Z79899 Other long term (current) drug therapy: Secondary | ICD-10-CM | POA: Diagnosis not present

## 2023-11-18 DIAGNOSIS — Z8673 Personal history of transient ischemic attack (TIA), and cerebral infarction without residual deficits: Secondary | ICD-10-CM | POA: Diagnosis not present

## 2023-11-18 DIAGNOSIS — Z7982 Long term (current) use of aspirin: Secondary | ICD-10-CM | POA: Diagnosis not present

## 2023-11-18 DIAGNOSIS — I10 Essential (primary) hypertension: Secondary | ICD-10-CM | POA: Diagnosis not present

## 2023-11-18 DIAGNOSIS — Z7901 Long term (current) use of anticoagulants: Secondary | ICD-10-CM | POA: Diagnosis not present

## 2023-11-18 DIAGNOSIS — I4819 Other persistent atrial fibrillation: Secondary | ICD-10-CM | POA: Diagnosis not present

## 2023-11-18 DIAGNOSIS — Z87891 Personal history of nicotine dependence: Secondary | ICD-10-CM | POA: Diagnosis not present

## 2023-11-18 DIAGNOSIS — K4090 Unilateral inguinal hernia, without obstruction or gangrene, not specified as recurrent: Secondary | ICD-10-CM | POA: Diagnosis not present

## 2023-11-18 NOTE — Progress Notes (Signed)
Discharge instructions (including medications) discussed with and copy provided to patient/caregiver

## 2023-11-18 NOTE — Discharge Instructions (Signed)
[facility], [place]  INGUINAL HERNIA REPAIR: POST OP INSTRUCTIONS  Always review your discharge instruction sheet given to you by the facility where your surgery was performed. IF YOU HAVE DISABILITY OR FAMILY LEAVE FORMS, YOU MUST BRING THEM TO THE OFFICE FOR PROCESSING.   DO NOT GIVE THEM TO YOUR DOCTOR.  A  prescription for pain medication may be given to you upon discharge.  Take your pain medication as prescribed, if needed.  If narcotic pain medicine is not needed, then you may take acetaminophen (Tylenol) or ibuprofen (Advil) as needed. Take your usually prescribed medications unless otherwise directed. If you need a refill on your pain medication, please contact your pharmacy.  They will contact our office to request authorization. Prescriptions will not be filled after 5 pm or on week-ends. You should follow a light diet the first 24 hours after arrival home, such as soup and crackers, etc.  Be sure to include lots of fluids daily.  Resume your normal diet the day after surgery. Most patients will experience some swelling and bruising in the groin.  Ice packs and reclining will help.  Swelling and bruising can take several days to resolve.  It is common to experience some constipation if taking pain medication after surgery.  Increasing fluid intake and taking a stool softener (such as Colace) will usually help or prevent this problem from occurring.  A mild laxative (Milk of Magnesia or Miralax) should be taken according to package directions if there are no bowel movements after 48 hours. Unless discharge instructions indicate otherwise, you may remove your bandages 24-48 hours after surgery, and you may shower at that time.  You will have steri-strips (small skin tapes) in place directly over the incision.  These strips should be left on the skin for 7-10 days. ACTIVITIES:  You may resume regular (light) daily activities beginning the next day--such as daily self-care, walking,  climbing stairs--gradually increasing activities as tolerated.  You may have sexual intercourse when it is comfortable.  Refrain from any heavy lifting or straining until approved by your doctor. You may drive when you are no longer taking prescription pain medication, you can comfortably wear a seatbelt, and you can safely maneuver your car and apply brakes. RETURN TO WORK:  2-3 weeks with light duty - no lifting over 15 lbs. You should see your doctor in the office for a follow-up appointment approximately 2-3 weeks after your surgery.  Make sure that you call for this appointment within a day or two after you arrive home to insure a convenient appointment time. OTHER INSTRUCTIONS:  __________________________________________________________________________________________________________________________________________________________________________________________  WHEN TO CALL YOUR DOCTOR: Fever over 101.0 Inability to urinate Nausea and/or vomiting Extreme swelling or bruising Continued bleeding from incision. Increased pain, redness, or drainage from the incision  The clinic staff is available to answer your questions during regular business hours.  Please don't hesitate to call and ask to speak to one of the nurses for clinical concerns.  If you have a medical emergency, go to the nearest emergency room or call 911.  A surgeon from [facility] is always on call at the hospital   [address], [place], [place]  [address] ?  [address] [address], [place], [place]   [address] [phone]    FAX [phone] Web site: [email or web address]

## 2023-11-18 NOTE — Plan of Care (Signed)

## 2023-11-18 NOTE — Discharge Summary (Signed)
Physician Discharge Summary  Patient ID: Kristina [name] MRN: [id number] DOB/AGE: May 06, 1946 78 y.o.  Admit date: 11/17/2023 Discharge date: 11/18/2023  Admission Diagnoses:  Right inguinal hernia  Discharge Diagnoses: same Principal Problem:   Right inguinal hernia   Discharged Condition: good  Hospital Course: Right inguinal hernia repair with mesh 2/12.  She was kept overnight for observation due to her medical comorbidities.  There were no issues overnight.  Tolerating diet.  Pain controlled.     Treatments: surgery: RIH repair with mesh  Discharge Exam: Blood pressure (!) 142/88, pulse 64, temperature 98 F (36.7 C), temperature source Oral, resp. rate 16, height 5\' 2"  (1.575 m), weight 107 kg, SpO2 97%. Right groin - dressing dry; no hematoma; moderately tender  Disposition: Discharge disposition: 01-Home or Self Care       Discharge Instructions     Call MD for:  persistant nausea and vomiting   Complete by: As directed    Call MD for:  redness, tenderness, or signs of infection (pain, swelling, redness, odor or green/yellow discharge around incision site)   Complete by: As directed    Call MD for:  severe uncontrolled pain   Complete by: As directed    Call MD for:  temperature >100.4   Complete by: As directed    Diet general   Complete by: As directed    Discharge instructions   Complete by: As directed    Pain medicine sent to pharmacy separately - oxycodone 5 mg q6 PRN   Driving Restrictions   Complete by: As directed    Do not drive while taking pain medications   Increase activity slowly   Complete by: As directed    May shower / Bathe   Complete by: As directed       Allergies as of 11/18/2023       Reactions   Hydrochlorothiazide Other (See Comments)   Should not be used in patients on Tikosyn.    Penicillins Anaphylaxis, Itching   Did it involve swelling of the face/tongue/throat, SOB, or low BP? Yes Did it involve sudden or severe  rash/hives, skin peeling, or any reaction on the inside of your mouth or nose? No Did you need to seek medical attention at a hospital or doctor's office? Yes When did it last happen?      Childhood allergy If all above answers are "NO", may proceed with cephalosporin use.   Mite (d. Farinae) Swelling   Other reaction(s): sinus swelling (dust mites)        Medication List     STOP taking these medications    oxyCODONE 5 MG immediate release tablet Commonly known as: Oxy IR/ROXICODONE       TAKE these medications    AIRBORNE GUMMIES PO Take 3 tablets by mouth in the morning.   apixaban 5 MG Tabs tablet Commonly known as: Eliquis Take 1 tablet (5 mg total) by mouth 2 (two) times daily. Pt. Was given samples from her PCP enough to last one month   aspirin EC 81 MG tablet Take 1 tablet (81 mg total) by mouth daily at 6 (six) AM. Swallow whole.   atorvastatin 20 MG tablet Commonly known as: LIPITOR Take 20 mg by mouth every morning.   cyanocobalamin 1000 MCG tablet Commonly known as: VITAMIN B12 Take 1,000 mcg by mouth in the morning.   dofetilide 500 MCG capsule Commonly known as: TIKOSYN Take 1 capsule (500 mcg total) by mouth 2 (two) times daily.   dorzolamide-timolol  2-0.5 % ophthalmic solution Commonly known as: COSOPT Place 1 drop into both eyes 2 (two) times daily.   ELDERBERRY PO Take 2 tablets by mouth in the morning. Airborne Immune gummy   ferrous sulfate 325 (65 FE) MG EC tablet Take 325 mg by mouth every Monday.   furosemide 40 MG tablet Commonly known as: LASIX Take 1 tablet (40 mg total) by mouth daily.   GLUCOSAMINE-CHONDROITIN PO Take 1 tablet by mouth daily.   losartan 100 MG tablet Commonly known as: COZAAR Take 100 mg by mouth in the morning.   multivitamin capsule Take 1 capsule by mouth daily. Vita fusion   Vitamin D (Ergocalciferol) 1.25 MG (50000 UNIT) Caps capsule Commonly known as: DRISDOL Take 1 capsule (50,000 Units total)  by mouth every 7 (seven) days. What changed: when to take this        Follow-up Information     Sigmund Hazel, MD Follow up.   Specialty: Family Medicine Contact information: [address] [place] [address] [phone]                 Signed: Wynona Luna 11/18/2023, 9:26 AM

## 2023-11-18 NOTE — Care Management Obs Status (Signed)
MEDICARE OBSERVATION STATUS NOTIFICATION   Patient Details  Name: Kristina [name] MRN: [id number] Date of Birth: 1946/08/17   Medicare Observation Status Notification Given:  Yes    Epifanio Lesches, RN 11/18/2023, 10:14 AM

## 2023-11-23 DIAGNOSIS — Z09 Encounter for follow-up examination after completed treatment for conditions other than malignant neoplasm: Secondary | ICD-10-CM | POA: Diagnosis not present

## 2023-11-23 DIAGNOSIS — Z8719 Personal history of other diseases of the digestive system: Secondary | ICD-10-CM | POA: Diagnosis not present

## 2023-11-30 ENCOUNTER — Other Ambulatory Visit: Payer: Self-pay | Admitting: Family Medicine

## 2023-11-30 DIAGNOSIS — Z1231 Encounter for screening mammogram for malignant neoplasm of breast: Secondary | ICD-10-CM

## 2023-12-07 DIAGNOSIS — G4733 Obstructive sleep apnea (adult) (pediatric): Secondary | ICD-10-CM | POA: Diagnosis not present

## 2023-12-07 DIAGNOSIS — I10 Essential (primary) hypertension: Secondary | ICD-10-CM | POA: Diagnosis not present

## 2023-12-08 ENCOUNTER — Ambulatory Visit: Payer: Medicare Other

## 2023-12-13 DIAGNOSIS — R319 Hematuria, unspecified: Secondary | ICD-10-CM | POA: Diagnosis not present

## 2023-12-13 DIAGNOSIS — Z7901 Long term (current) use of anticoagulants: Secondary | ICD-10-CM | POA: Diagnosis not present

## 2023-12-14 ENCOUNTER — Ambulatory Visit

## 2023-12-23 ENCOUNTER — Ambulatory Visit
Admission: RE | Admit: 2023-12-23 | Discharge: 2023-12-23 | Disposition: A | Discharge status: Home / Self Care | Source: Ambulatory Visit | Attending: Family Medicine | Admitting: Family Medicine

## 2023-12-23 DIAGNOSIS — Z1231 Encounter for screening mammogram for malignant neoplasm of breast: Secondary | ICD-10-CM

## 2023-12-28 DIAGNOSIS — H02831 Dermatochalasis of right upper eyelid: Secondary | ICD-10-CM | POA: Diagnosis not present

## 2023-12-28 DIAGNOSIS — H02834 Dermatochalasis of left upper eyelid: Secondary | ICD-10-CM | POA: Diagnosis not present

## 2023-12-28 DIAGNOSIS — H40053 Ocular hypertension, bilateral: Secondary | ICD-10-CM | POA: Diagnosis not present

## 2023-12-28 DIAGNOSIS — H2513 Age-related nuclear cataract, bilateral: Secondary | ICD-10-CM | POA: Diagnosis not present

## 2024-01-03 DIAGNOSIS — R319 Hematuria, unspecified: Secondary | ICD-10-CM | POA: Diagnosis not present

## 2024-01-06 DIAGNOSIS — H2512 Age-related nuclear cataract, left eye: Secondary | ICD-10-CM | POA: Diagnosis not present

## 2024-01-11 DIAGNOSIS — H2511 Age-related nuclear cataract, right eye: Secondary | ICD-10-CM | POA: Diagnosis not present

## 2024-01-12 ENCOUNTER — Ambulatory Visit: Payer: Medicare Other | Attending: Nurse Practitioner | Admitting: Nurse Practitioner

## 2024-01-12 ENCOUNTER — Encounter: Payer: Self-pay | Admitting: Nurse Practitioner

## 2024-01-12 VITALS — BP 138/72 | HR 66 | Ht 63.0 in | Wt 239.6 lb

## 2024-01-12 DIAGNOSIS — I6523 Occlusion and stenosis of bilateral carotid arteries: Secondary | ICD-10-CM | POA: Diagnosis not present

## 2024-01-12 DIAGNOSIS — R600 Localized edema: Secondary | ICD-10-CM | POA: Diagnosis not present

## 2024-01-12 DIAGNOSIS — I350 Nonrheumatic aortic (valve) stenosis: Secondary | ICD-10-CM

## 2024-01-12 DIAGNOSIS — E785 Hyperlipidemia, unspecified: Secondary | ICD-10-CM

## 2024-01-12 DIAGNOSIS — G4733 Obstructive sleep apnea (adult) (pediatric): Secondary | ICD-10-CM | POA: Diagnosis not present

## 2024-01-12 DIAGNOSIS — I10 Essential (primary) hypertension: Secondary | ICD-10-CM

## 2024-01-12 DIAGNOSIS — I480 Paroxysmal atrial fibrillation: Secondary | ICD-10-CM | POA: Diagnosis not present

## 2024-01-12 NOTE — Patient Instructions (Signed)
 Medication Instructions:  Your physician recommends that you continue on your current medications as directed. Please refer to the Current Medication list given to you today.  *If you need a refill on your cardiac medications before your next appointment, please call your pharmacy*  Lab Work: NONE ordered at this time of appointment    Testing/Procedures: NONE ordered at this time of appointment   Follow-Up: At [facility], you and your health needs are our priority.  As part of our continuing mission to provide you with exceptional heart care, our providers are all part of one team.  This team includes your primary Cardiologist (physician) and Advanced Practice Providers or APPs (Physician Assistants and Nurse Practitioners) who all work together to provide you with the care you need, when you need it.  Your next appointment:   6 month(s)  Provider:   [name] NP    We recommend signing up for the patient portal called "MyChart".  Sign up information is provided on this After Visit Summary.  MyChart is used to connect with patients for Virtual Visits (Telemedicine).  Patients are able to view lab/test results, encounter notes, upcoming appointments, etc.  Non-urgent messages can be sent to your provider as well.   To learn more about what you can do with MyChart, go to [email or web address].   Other Instructions           1st Floor: - Lobby - Registration  - Pharmacy  - Lab - Cafe  2nd Floor: - PV Lab - Diagnostic Testing (echo, CT, nuclear med)  3rd Floor: - Vacant  4th Floor: - TCTS (cardiothoracic surgery) - AFib Clinic - Structural Heart Clinic - Vascular Surgery  - Vascular Ultrasound  5th Floor: - HeartCare Cardiology (general and EP) - Clinical Pharmacy for coumadin, hypertension, lipid, weight-loss medications, and med management appointments    Valet parking services will be available as well.

## 2024-01-12 NOTE — Progress Notes (Unsigned)
 Office Visit    Patient Name: Kristina [name] Date of Encounter: 01/12/2024  Primary Care Provider:  Sigmund Hazel, MD Primary Cardiologist:  Nanetta Batty, MD  Chief Complaint    78 year old female with a history of paroxysmal atrial fibrillation, aortic stenosis, carotid artery disease, hypertension, and hyperlipidemia who presents for follow-up related to atrial fibrillation.    Past Medical History    Past Medical History:  Diagnosis Date   Allergic rhinitis    Anemia glaucoma   Anxiety    Atrial fibrillation (HCC)    Back pain    Carotid arterial disease (HCC)    Cataract    Mixed OU   DDD (degenerative disc disease), lumbar    Dysrhythmia    A. Fib   Family history of adverse reaction to anesthesia    daughter has PONV   Heart murmur    ECHO 10/23/22   High blood pressure    High cholesterol    History of stomach ulcers    Hypertensive retinopathy    OU   Peripheral vascular disease (HCC)    Rheumatic fever    Rheumatoid arteritis (HCC)    Sleep apnea    Past Surgical History:  Procedure Laterality Date   APPENDECTOMY  1955   CARDIOVERSION N/A 02/25/2023   Procedure: CARDIOVERSION;  Surgeon: Jodelle Red, MD;  Location: [facility] INVASIVE CV LAB;  Service: Cardiovascular;  Laterality: N/A;   CHOLECYSTECTOMY  2006   COLONOSCOPY  2023   DILATATION & CURETTAGE/HYSTEROSCOPY WITH MYOSURE N/A 04/15/2022   Procedure: DILATATION & CURETTAGE/HYSTEROSCOPY WITH MYOSURE;  Surgeon: Gerald Leitz, MD;  Location: [facility] OR;  Service: Gynecology;  Laterality: N/A;   ENDARTERECTOMY Left 09/27/2019   Procedure: ENDARTERECTOMY CAROTID LEFT;  Surgeon: Nada Libman, MD;  Location: [facility] OR;  Service: Vascular;  Laterality: Left;   ENDARTERECTOMY Right 08/18/2023   Procedure: RIGHT CAROTID ENDARTERECTOMY WITH PATCH ANGIOPLASTY;  Surgeon: Nada Libman, MD;  Location: [facility] OR;  Service: Vascular;  Laterality: Right;   HERNIA REPAIR  2007   incisional hernia after cholecystecomy    HERNIA REPAIR  2015   incisional hernia   INGUINAL HERNIA REPAIR Right 11/17/2023   Procedure: RIGHT INGUINAL HERNIA REPAIR;  Surgeon: Manus Rudd, MD;  Location: [facility] OR;  Service: General;  Laterality: Right;  GEN AND TAP BLOCK   INSERTION OF MESH Right 11/17/2023   Procedure: INSERTION OF MESH;  Surgeon: Manus Rudd, MD;  Location: [facility] OR;  Service: General;  Laterality: Right;   PATCH ANGIOPLASTY Left 09/27/2019   Procedure: Patch Angioplasty Using Livia Snellen;  Surgeon: Nada Libman, MD;  Location: [facility] OR;  Service: Vascular;  Laterality: Left;   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   TONSILLECTOMY  1963   removed at 78 years old    Allergies  Allergies  Allergen Reactions   Hydrochlorothiazide Other (See Comments)    Should not be used in patients on Tikosyn.    Penicillins Anaphylaxis and Itching    Did it involve swelling of the face/tongue/throat, SOB, or low BP? Yes Did it involve sudden or severe rash/hives, skin peeling, or any reaction on the inside of your mouth or nose? No Did you need to seek medical attention at a hospital or doctor's office? Yes When did it last happen?      Childhood allergy If all above answers are "NO", may proceed with cephalosporin use.    Mite (D. Farinae) Swelling    Other reaction(s): sinus swelling (dust mites)  Labs/Other Studies Reviewed    The following studies were reviewed today:  Cardiac Studies & Procedures   ______________________________________________________________________________________________     ECHOCARDIOGRAM  ECHOCARDIOGRAM COMPLETE 11/04/2023  Narrative ECHOCARDIOGRAM REPORT    Patient Name:   Kristina [name] Date of Exam: 11/04/2023 Medical Rec #:  [id number]         Height:       62.0 in Accession #:    [id number]        Weight:       235.0 lb Date of Birth:  April 12, 1946          BSA:          2.047 m Patient Age:    77 years          BP:           162/82 mmHg Patient Gender: F                  HR:           58 bpm. Exam Location:  [address]  Procedure: 2D Echo, 3D Echo, Cardiac Doppler, Color Doppler and Strain Analysis  Indications:    Aortic stenosis I35.0  History:        Patient has prior history of Echocardiogram examinations, most recent 10/23/2022. Arrythmias:Atrial Fibrillation; Risk Factors:Hypertension. Rheumatic Fever.  Sonographer:    Eulah Pont RDCS Referring Phys: [phone] Jenniferann Stuckert C Etai Copado   Sonographer Comments: Global longitudinal strain was attempted. IMPRESSIONS   1. Left ventricular ejection fraction, by estimation, is 60 to 65%. The left ventricle has normal function. The left ventricle has no regional wall motion abnormalities. Left ventricular diastolic parameters are consistent with Grade I diastolic dysfunction (impaired relaxation). GLS -15.8%. 2. Right ventricular systolic function is normal. The right ventricular size is normal. There is normal pulmonary artery systolic pressure. 3. Left atrial size was moderately dilated. 4. The mitral valve is normal in structure. Trivial mitral valve regurgitation. No evidence of mitral stenosis. 5. The aortic valve is severely calcified. Aortic valve regurgitation is not visualized. Mild to moderate aortic valve stenosis. Aortic valve area, by VTI measures 1.26 cm. Aortic valve mean gradient measures 19.0 mmHg. SVi of 98ml/m2. 6. The inferior vena cava is normal in size with greater than 50% respiratory variability, suggesting right atrial pressure of 3 mmHg.  FINDINGS Left Ventricle: Left ventricular ejection fraction, by estimation, is 60 to 65%. The left ventricle has normal function. The left ventricle has no regional wall motion abnormalities. The left ventricular internal cavity size was normal in size. There is no left ventricular hypertrophy. Left ventricular diastolic parameters are consistent with Grade I diastolic dysfunction (impaired relaxation).  Right Ventricle: The right ventricular size is  normal. No increase in right ventricular wall thickness. Right ventricular systolic function is normal. There is normal pulmonary artery systolic pressure. The tricuspid regurgitant velocity is 2.37 m/s, and with an assumed right atrial pressure of 3 mmHg, the estimated right ventricular systolic pressure is 25.5 mmHg.  Left Atrium: Left atrial size was moderately dilated.  Right Atrium: Right atrial size was normal in size.  Pericardium: There is no evidence of pericardial effusion.  Mitral Valve: The mitral valve is normal in structure. Trivial mitral valve regurgitation. No evidence of mitral valve stenosis.  Tricuspid Valve: The tricuspid valve is normal in structure. Tricuspid valve regurgitation is not demonstrated. No evidence of tricuspid stenosis.  Aortic Valve: The aortic valve is calcified. There is moderate aortic valve annular calcification. Aortic valve regurgitation  is not visualized. Mild to moderate aortic stenosis is present. Aortic valve mean gradient measures 19.0 mmHg. Aortic valve peak gradient measures 31.2 mmHg. Aortic valve area, by VTI measures 1.26 cm.  Pulmonic Valve: The pulmonic valve was normal in structure. Pulmonic valve regurgitation is not visualized. No evidence of pulmonic stenosis.  Aorta: The aortic root is normal in size and structure.  Venous: The inferior vena cava is normal in size with greater than 50% respiratory variability, suggesting right atrial pressure of 3 mmHg.  IAS/Shunts: No atrial level shunt detected by color flow Doppler.   LEFT VENTRICLE PLAX 2D LVIDd:         5.75 cm   Diastology LVIDs:         3.79 cm   LV e' medial:    6.68 cm/s LV PW:         0.97 cm   LV E/e' medial:  14.4 LV IVS:        0.97 cm   LV e' lateral:   10.40 cm/s LVOT diam:     2.10 cm   LV E/e' lateral: 9.2 LV SV:         91 LV SV Index:   44 LVOT Area:     3.46 cm  3D Volume EF: 3D EF:        58 % LV EDV:       160 ml LV ESV:       68 ml LV SV:         92 ml  RIGHT VENTRICLE RV S prime:     12.90 cm/s TAPSE (M-mode): 2.5 cm  LEFT ATRIUM             Index        RIGHT ATRIUM           Index LA diam:        5.57 cm 2.72 cm/m   RA Area:     15.50 cm LA Vol (A2C):   96.4 ml 47.09 ml/m  RA Volume:   35.70 ml  17.44 ml/m LA Vol (A4C):   93.1 ml 45.47 ml/m LA Biplane Vol: 95.6 ml 46.70 ml/m AORTIC VALVE AV Area (Vmax):    1.20 cm AV Area (Vmean):   1.12 cm AV Area (VTI):     1.26 cm AV Vmax:           279.50 cm/s AV Vmean:          201.250 cm/s AV VTI:            0.718 m AV Peak Grad:      31.2 mmHg AV Mean Grad:      19.0 mmHg LVOT Vmax:         96.70 cm/s LVOT Vmean:        65.100 cm/s LVOT VTI:          0.262 m LVOT/AV VTI ratio: 0.37  AORTA Ao Root diam: 3.31 cm Ao Asc diam:  3.59 cm  MITRAL VALVE                TRICUSPID VALVE MV Area (PHT): 2.26 cm     TR Peak grad:   22.5 mmHg MV Decel Time: 335 msec     TR Vmax:        237.00 cm/s MR Peak grad: 102.4 mmHg MR Vmax:      506.00 cm/s   SHUNTS MV E velocity: 96.20 cm/s   Systemic VTI:  0.26 m  MV A velocity: 118.00 cm/s  Systemic Diam: 2.10 cm MV E/A ratio:  0.82  Aditya Sabharwal Electronically signed by Dorthula Nettles Signature Date/Time: 11/04/2023/11:45:21 AM    Final          ______________________________________________________________________________________________     Recent Labs: 08/19/2023: Magnesium 2.0 08/20/2023: ALT 26 11/11/2023: BUN 11; Creatinine, Ser 0.59; Hemoglobin 11.4; Platelets 283; Potassium 4.7; Sodium 141  Recent Lipid Panel    Component Value Date/Time   CHOL 125 08/19/2023 0345   CHOL 169 09/23/2021 0858   TRIG 57 08/19/2023 0345   HDL 59 08/19/2023 0345   HDL 66 09/23/2021 0858   CHOLHDL 2.1 08/19/2023 0345   VLDL 11 08/19/2023 0345   LDLCALC 55 08/19/2023 0345   LDLCALC 85 09/23/2021 0858    History of Present Illness    78 year old female with the above past medical history including paroxysmal atrial  fibrillation, aortic stenosis, carotid artery disease, hypertension, and hyperlipidemia.   Echocardiogram in 09/2021 showed normal LV systolic function, G1 DD, mild aortic stenosis.  Carotid Dopplers at the time revealed moderate R ICA and high-grade LICA stenosis.  She was referred to vascular surgery.  Repeat echocardiogram in 10/2022 showed EF 60 to 65%, normal LV function, no RWMA, mild LVH, G1 DD, normal RV systolic function, mild mitral valve regurgitation, moderate aortic stenosis, mean gradient 19 mmHg, increased from prior.  Repeat echocardiogram was recommended in 1 year.  Most recent carotid ultrasound in 10/2022 showed 80 to 99% R ICA stenosis, 1 to 39% LICA stenosis.  She was asymptomatic.  Follow-up CTA of the head and neck was recommended in 6 months.  She saw her PCP in 01/2023 who identified new atrial fibrillation with RVR. She was started on Eliquis and metoprolol. She underwent successful DCCV on 02/25/2023 with restoration of sinus rhythm.  Unfortunately, days later she had  early recurrence of atrial fibrillation, which prompted ED visit.  She was referred to the A-fib clinic/EP and ultimately underwent Tikosyn load in 06/2023.  She underwent carotid endarterectomy in 08/2023.  She was readmitted in 08/2023 in the setting of hematoma to her R CEA surgical site.  She was last seen in the office on 10/14/2023 and was stable overall from a cardiac standpoint.  Her BP was elevated.  Ongoing monitoring of BP was advised.  She was cleared for inguinal hernia repair.  Repeat echocardiogram in 10/2023 showed EF 60 to 65%, normal LV function, G1 DD, normal RV systolic function, mild to moderate aortic valve stenosis, mean gradient 19 mmHg.   She presents today for follow-up.  Since her last visit she has done well from a cardiac standpoint. She is recovering well from her hernia surgery.   She is exercising regularly, walking on an indoor track at her gym, without any complaints. She recently underwent  toric lens replacement of her L eye and has her right eye scheduled for next week.  She denies chest pain, palpitations, dizziness, dyspnea, edema, PND, orthopnea, weight gain. She is looking forward to taking her granddaughter to [place] this summer. Overall, she reports feeling well.   Home Medications    Current Outpatient Medications  Medication Sig Dispense Refill   apixaban (ELIQUIS) 5 MG TABS tablet Take 1 tablet (5 mg total) by mouth 2 (two) times daily. Pt. Was given samples from her PCP enough to last one month 180 tablet 3   aspirin EC 81 MG tablet Take 1 tablet (81 mg total) by mouth daily at 6 (six) AM. Swallow whole.  30 tablet 12   atorvastatin (LIPITOR) 20 MG tablet Take 20 mg by mouth every morning.     cyanocobalamin (VITAMIN B12) 1000 MCG tablet Take 1,000 mcg by mouth in the morning.     dofetilide (TIKOSYN) 500 MCG capsule Take 1 capsule (500 mcg total) by mouth 2 (two) times daily. 180 capsule 3   dorzolamide-timolol (COSOPT) 22.3-6.8 MG/ML ophthalmic solution Place 1 drop into both eyes 2 (two) times daily. 10 mL 4   ELDERBERRY PO Take 2 tablets by mouth in the morning. Airborne Immune gummy     ferrous sulfate 325 (65 FE) MG EC tablet Take 325 mg by mouth every Monday.     GLUCOSAMINE-CHONDROITIN PO Take 1 tablet by mouth daily.     losartan (COZAAR) 100 MG tablet Take 100 mg by mouth in the morning.     Multiple Vitamin (MULTIVITAMIN) capsule Take 1 capsule by mouth daily. Vita fusion     Multiple Vitamins-Minerals (AIRBORNE GUMMIES PO) Take 3 tablets by mouth in the morning.     Vitamin D, Ergocalciferol, (DRISDOL) 1.25 MG (50000 UNIT) CAPS capsule Take 1 capsule (50,000 Units total) by mouth every 7 (seven) days. (Patient taking differently: Take 50,000 Units by mouth every Monday.) 4 capsule 0   furosemide (LASIX) 40 MG tablet Take 1 tablet (40 mg total) by mouth daily. 90 tablet 3   No current facility-administered medications for this visit.     Review of  Systems    She denies chest pain, palpitations, dyspnea, pnd, orthopnea, n, v, dizziness, syncope, edema, weight gain, or early satiety. All other systems reviewed and are otherwise negative except as noted above.   Physical Exam    VS:  BP 138/72   Pulse 66   Ht 5\' 3"  (1.6 m)   Wt 239 lb 9.6 oz (108.7 kg)   SpO2 97%   BMI 42.44 kg/m  GEN: Well nourished, well developed, in no acute distress. HEENT: normal. Neck: Supple, no JVD, carotid bruits, or masses. Cardiac: RRR, no murmurs, rubs, or gallops. No clubbing, cyanosis, edema.  Radials/DP/PT 2+ and equal bilaterally.  Respiratory:  Respirations regular and unlabored, clear to auscultation bilaterally. GI: Soft, nontender, nondistended, BS + x 4. MS: no deformity or atrophy. Skin: warm and dry, no rash. Neuro:  Strength and sensation are intact. Psych: Normal affect.  Accessory Clinical Findings    ECG personally reviewed by me today - EKG Interpretation Date/Time:  Wednesday January 12 2024 09:54:55 EDT Ventricular Rate:  62 PR Interval:  202 QRS Duration:  92 QT Interval:  438 QTC Calculation: 444 R Axis:   66  Text Interpretation: Normal sinus rhythm Normal ECG When compared with ECG of 07-Oct-2023 10:24, Premature atrial complexes are no longer Present Confirmed by [name] ([id number]) on 01/12/2024 9:56:21 AM  - no acute changes.   Lab Results  Component Value Date   WBC 4.8 11/11/2023   HGB 11.4 (L) 11/11/2023   HCT 38.3 11/11/2023   MCV 67.0 (L) 11/11/2023   PLT 283 11/11/2023   Lab Results  Component Value Date   CREATININE 0.59 11/11/2023   BUN 11 11/11/2023   NA 141 11/11/2023   K 4.7 11/11/2023   CL 101 11/11/2023   CO2 30 11/11/2023   Lab Results  Component Value Date   ALT 26 08/20/2023   AST 22 08/20/2023   ALKPHOS 54 08/20/2023   BILITOT 0.9 08/20/2023   Lab Results  Component Value Date   CHOL 125 08/19/2023  HDL 59 08/19/2023   LDLCALC 55 08/19/2023   TRIG 57 08/19/2023   CHOLHDL 2.1  08/19/2023    Lab Results  Component Value Date   HGBA1C 5.6 01/27/2022    Assessment & Plan    1. Persistent atrial fibrillation: S/p DCCV on 02/25/2023 with early recurrence of atrial fibrillation.  Following her cardioversion she noted increased shortness of breath, bilateral lower extremity edema, improved with Lasix.  S/p Tikosyn load per EP.  Maintaining sinus rhythm, QT is stable. She has follow-up planned with the A-fib clinic in 03/2024.  Continue metoprolol, Tikosyn, Eliquis.    2. Aortic stenosis/bilateral lower extremity edema: Most recent echocardiogram in 10/2023 showed EF 60 to 65%, normal LV function, G1 DD, normal RV systolic function, mild to moderate aortic valve stenosis, mean gradient 19 mmHg, stable compared to prior.  Plan for repeat echo 08/2025.  Euvolemic, compensated on exam.  Continue Lasix.   3. Carotid artery disease: Most recent carotid ultrasound in 10/2022 showed 80 to 99% R ICA stenosis, 1 to 39% LICA stenosis.  CT of the head and neck in July 2024 showed closer to 70% stenosis.  S/p R CEA in 08/2023.  Following with vascular surgery.  Continue ASA, Lipitor.    4. Hypertension: BP elevated in office today, improved with recheck, has been generally well-controlled.  She will continue to monitor BP and report BP consistent greater than 140/90.  For now, continue current antihypertensive regimen.    5. Hyperlipidemia: LDL was 55 in 08/2023.  Continue Lipitor.   6. OSA: Adherent to CPAP.   7. Disposition: Follow-up in 6 months.      Joylene Grapes, NP 01/12/2024, 10:22 AM

## 2024-01-13 ENCOUNTER — Encounter: Payer: Self-pay | Admitting: Nurse Practitioner

## 2024-01-20 DIAGNOSIS — H2511 Age-related nuclear cataract, right eye: Secondary | ICD-10-CM | POA: Diagnosis not present

## 2024-01-26 DIAGNOSIS — R350 Frequency of micturition: Secondary | ICD-10-CM | POA: Diagnosis not present

## 2024-01-26 DIAGNOSIS — R310 Gross hematuria: Secondary | ICD-10-CM | POA: Diagnosis not present

## 2024-02-18 DIAGNOSIS — K5730 Diverticulosis of large intestine without perforation or abscess without bleeding: Secondary | ICD-10-CM | POA: Diagnosis not present

## 2024-02-18 DIAGNOSIS — Z9049 Acquired absence of other specified parts of digestive tract: Secondary | ICD-10-CM | POA: Diagnosis not present

## 2024-02-18 DIAGNOSIS — R310 Gross hematuria: Secondary | ICD-10-CM | POA: Diagnosis not present

## 2024-03-01 DIAGNOSIS — M65311 Trigger thumb, right thumb: Secondary | ICD-10-CM | POA: Diagnosis not present

## 2024-03-01 DIAGNOSIS — M65341 Trigger finger, right ring finger: Secondary | ICD-10-CM | POA: Diagnosis not present

## 2024-03-10 DIAGNOSIS — R310 Gross hematuria: Secondary | ICD-10-CM | POA: Diagnosis not present

## 2024-03-28 DIAGNOSIS — M19012 Primary osteoarthritis, left shoulder: Secondary | ICD-10-CM | POA: Diagnosis not present

## 2024-04-03 DIAGNOSIS — I4819 Other persistent atrial fibrillation: Secondary | ICD-10-CM | POA: Diagnosis not present

## 2024-04-03 DIAGNOSIS — M1711 Unilateral primary osteoarthritis, right knee: Secondary | ICD-10-CM | POA: Diagnosis not present

## 2024-04-03 DIAGNOSIS — I10 Essential (primary) hypertension: Secondary | ICD-10-CM | POA: Diagnosis not present

## 2024-04-03 DIAGNOSIS — M179 Osteoarthritis of knee, unspecified: Secondary | ICD-10-CM | POA: Diagnosis not present

## 2024-05-03 DIAGNOSIS — I10 Essential (primary) hypertension: Secondary | ICD-10-CM | POA: Diagnosis not present

## 2024-05-03 DIAGNOSIS — Z8679 Personal history of other diseases of the circulatory system: Secondary | ICD-10-CM | POA: Diagnosis not present

## 2024-05-03 DIAGNOSIS — G4733 Obstructive sleep apnea (adult) (pediatric): Secondary | ICD-10-CM | POA: Diagnosis not present

## 2024-05-03 DIAGNOSIS — E7800 Pure hypercholesterolemia, unspecified: Secondary | ICD-10-CM | POA: Diagnosis not present

## 2024-05-03 DIAGNOSIS — E559 Vitamin D deficiency, unspecified: Secondary | ICD-10-CM | POA: Diagnosis not present

## 2024-05-03 DIAGNOSIS — I4819 Other persistent atrial fibrillation: Secondary | ICD-10-CM | POA: Diagnosis not present

## 2024-05-04 DIAGNOSIS — I10 Essential (primary) hypertension: Secondary | ICD-10-CM | POA: Diagnosis not present

## 2024-05-04 DIAGNOSIS — I4819 Other persistent atrial fibrillation: Secondary | ICD-10-CM | POA: Diagnosis not present

## 2024-05-04 DIAGNOSIS — M1711 Unilateral primary osteoarthritis, right knee: Secondary | ICD-10-CM | POA: Diagnosis not present

## 2024-05-04 DIAGNOSIS — M179 Osteoarthritis of knee, unspecified: Secondary | ICD-10-CM | POA: Diagnosis not present

## 2024-05-15 DIAGNOSIS — I350 Nonrheumatic aortic (valve) stenosis: Secondary | ICD-10-CM | POA: Diagnosis not present

## 2024-05-15 DIAGNOSIS — R600 Localized edema: Secondary | ICD-10-CM | POA: Diagnosis not present

## 2024-05-15 DIAGNOSIS — I5189 Other ill-defined heart diseases: Secondary | ICD-10-CM | POA: Diagnosis not present

## 2024-05-18 DIAGNOSIS — M1711 Unilateral primary osteoarthritis, right knee: Secondary | ICD-10-CM | POA: Diagnosis not present

## 2024-05-25 DIAGNOSIS — M1711 Unilateral primary osteoarthritis, right knee: Secondary | ICD-10-CM | POA: Diagnosis not present

## 2024-06-01 DIAGNOSIS — M1711 Unilateral primary osteoarthritis, right knee: Secondary | ICD-10-CM | POA: Diagnosis not present

## 2024-06-04 DIAGNOSIS — I10 Essential (primary) hypertension: Secondary | ICD-10-CM | POA: Diagnosis not present

## 2024-06-04 DIAGNOSIS — M179 Osteoarthritis of knee, unspecified: Secondary | ICD-10-CM | POA: Diagnosis not present

## 2024-06-04 DIAGNOSIS — I4819 Other persistent atrial fibrillation: Secondary | ICD-10-CM | POA: Diagnosis not present

## 2024-06-04 DIAGNOSIS — M1711 Unilateral primary osteoarthritis, right knee: Secondary | ICD-10-CM | POA: Diagnosis not present

## 2024-06-06 ENCOUNTER — Other Ambulatory Visit: Payer: Self-pay | Admitting: Cardiovascular Disease

## 2024-06-07 DIAGNOSIS — G4733 Obstructive sleep apnea (adult) (pediatric): Secondary | ICD-10-CM | POA: Diagnosis not present

## 2024-06-07 DIAGNOSIS — I4819 Other persistent atrial fibrillation: Secondary | ICD-10-CM | POA: Diagnosis not present

## 2024-06-07 DIAGNOSIS — L814 Other melanin hyperpigmentation: Secondary | ICD-10-CM | POA: Diagnosis not present

## 2024-06-07 DIAGNOSIS — L578 Other skin changes due to chronic exposure to nonionizing radiation: Secondary | ICD-10-CM | POA: Diagnosis not present

## 2024-06-07 DIAGNOSIS — L821 Other seborrheic keratosis: Secondary | ICD-10-CM | POA: Diagnosis not present

## 2024-06-07 DIAGNOSIS — D229 Melanocytic nevi, unspecified: Secondary | ICD-10-CM | POA: Diagnosis not present

## 2024-06-07 DIAGNOSIS — Z9889 Other specified postprocedural states: Secondary | ICD-10-CM | POA: Diagnosis not present

## 2024-06-07 DIAGNOSIS — I10 Essential (primary) hypertension: Secondary | ICD-10-CM | POA: Diagnosis not present

## 2024-06-07 DIAGNOSIS — Z8673 Personal history of transient ischemic attack (TIA), and cerebral infarction without residual deficits: Secondary | ICD-10-CM | POA: Diagnosis not present

## 2024-06-19 DIAGNOSIS — E119 Type 2 diabetes mellitus without complications: Secondary | ICD-10-CM | POA: Diagnosis not present

## 2024-06-19 DIAGNOSIS — H40053 Ocular hypertension, bilateral: Secondary | ICD-10-CM | POA: Diagnosis not present

## 2024-06-19 DIAGNOSIS — H35033 Hypertensive retinopathy, bilateral: Secondary | ICD-10-CM | POA: Diagnosis not present

## 2024-06-21 ENCOUNTER — Other Ambulatory Visit: Payer: Self-pay

## 2024-06-21 DIAGNOSIS — I6523 Occlusion and stenosis of bilateral carotid arteries: Secondary | ICD-10-CM

## 2024-06-27 DIAGNOSIS — Z0000 Encounter for general adult medical examination without abnormal findings: Secondary | ICD-10-CM | POA: Diagnosis not present

## 2024-07-04 DIAGNOSIS — G4733 Obstructive sleep apnea (adult) (pediatric): Secondary | ICD-10-CM | POA: Diagnosis not present

## 2024-07-04 DIAGNOSIS — M1711 Unilateral primary osteoarthritis, right knee: Secondary | ICD-10-CM | POA: Diagnosis not present

## 2024-07-04 DIAGNOSIS — M179 Osteoarthritis of knee, unspecified: Secondary | ICD-10-CM | POA: Diagnosis not present

## 2024-07-04 DIAGNOSIS — I10 Essential (primary) hypertension: Secondary | ICD-10-CM | POA: Diagnosis not present

## 2024-07-04 DIAGNOSIS — I4819 Other persistent atrial fibrillation: Secondary | ICD-10-CM | POA: Diagnosis not present

## 2024-07-10 ENCOUNTER — Encounter: Payer: Self-pay | Admitting: Nurse Practitioner

## 2024-07-10 ENCOUNTER — Telehealth: Payer: Self-pay

## 2024-07-10 ENCOUNTER — Ambulatory Visit
Admission: RE | Admit: 2024-07-10 | Discharge: 2024-07-10 | Disposition: A | Discharge status: Home / Self Care | Source: Ambulatory Visit | Attending: Surgery | Admitting: Surgery

## 2024-07-10 ENCOUNTER — Ambulatory Visit: Admitting: Nurse Practitioner

## 2024-07-10 VITALS — BP 113/71 | HR 60 | Ht 63.0 in | Wt 238.4 lb

## 2024-07-10 DIAGNOSIS — R600 Localized edema: Secondary | ICD-10-CM | POA: Insufficient documentation

## 2024-07-10 DIAGNOSIS — I350 Nonrheumatic aortic (valve) stenosis: Secondary | ICD-10-CM | POA: Insufficient documentation

## 2024-07-10 DIAGNOSIS — I10 Essential (primary) hypertension: Secondary | ICD-10-CM

## 2024-07-10 DIAGNOSIS — G4733 Obstructive sleep apnea (adult) (pediatric): Secondary | ICD-10-CM | POA: Insufficient documentation

## 2024-07-10 DIAGNOSIS — E785 Hyperlipidemia, unspecified: Secondary | ICD-10-CM | POA: Insufficient documentation

## 2024-07-10 DIAGNOSIS — I480 Paroxysmal atrial fibrillation: Secondary | ICD-10-CM

## 2024-07-10 DIAGNOSIS — I6523 Occlusion and stenosis of bilateral carotid arteries: Secondary | ICD-10-CM | POA: Diagnosis not present

## 2024-07-10 MED ORDER — FUROSEMIDE 40 MG PO TABS: 40.0000 mg | ORAL_TABLET | Freq: Every day | ORAL | 3 refills | Status: AC

## 2024-07-10 NOTE — Patient Instructions (Addendum)
 Medication Instructions:  Your physician recommends that you continue on your current medications as directed. Please refer to the Current Medication list given to you today.  *If you need a refill on your cardiac medications before your next appointment, please call your pharmacy*  Lab Work: CBC, CMET, Fasting Lipid panel, TSH  Testing/Procedures: Your physician has requested that you have an echocardiogram January 2026. Echocardiography is a painless test that uses sound waves to create images of your heart. It provides your doctor with information about the size and shape of your heart and how well your heart's chambers and valves are working. This procedure takes approximately one hour. There are no restrictions for this procedure. Please do NOT wear cologne, perfume, aftershave, or lotions (deodorant is allowed). Please arrive 15 minutes prior to your appointment time.  Please note: We ask at that you not bring children with you during ultrasound (echo/ vascular) testing. Due to room size and safety concerns, children are not allowed in the ultrasound rooms during exams. Our front office staff cannot provide observation of children in our lobby area while testing is being conducted. An adult accompanying a patient to their appointment will only be allowed in the ultrasound room at the discretion of the ultrasound technician under special circumstances. We apologize for any inconvenience.   Follow-Up: At [facility], you and your health needs are our priority.  As part of our continuing mission to provide you with exceptional heart care, our providers are all part of one team.  This team includes your primary Cardiologist (physician) and Advanced Practice Providers or APPs (Physician Assistants and Nurse Practitioners) who all work together to provide you with the care you need, when you need it.  Your next appointment:   6 month(s) (Dr. Court or Damien Braver NP) 1 month [facility]     We recommend signing up for the patient portal called MyChart.  Sign up information is provided on this After Visit Summary.  MyChart is used to connect with patients for Virtual Visits (Telemedicine).  Patients are able to view lab/test results, encounter notes, upcoming appointments, etc.  Non-urgent messages can be sent to your provider as well.   To learn more about what you can do with MyChart, go to [email or web address].

## 2024-07-10 NOTE — Progress Notes (Signed)
 Office Visit    Patient Name: Kristina [name] Date of Encounter: 07/10/2024  Primary Care Provider:  Cleotilde Planas, MD Primary Cardiologist:  Dorn Lesches, MD  Chief Complaint    78 year old female with a history of paroxysmal atrial fibrillation, aortic stenosis, carotid artery disease, hypertension, and hyperlipidemia who presents for follow-up related to atrial fibrillation.   Past Medical History    Past Medical History:  Diagnosis Date   Allergic rhinitis    Anemia glaucoma   Anxiety    Atrial fibrillation (HCC)    Back pain    Carotid arterial disease    Cataract    Mixed OU   DDD (degenerative disc disease), lumbar    Dysrhythmia    A. Fib   Family history of adverse reaction to anesthesia    daughter has PONV   Heart murmur    ECHO 10/23/22   High blood pressure    High cholesterol    History of stomach ulcers    Hypertensive retinopathy    OU   Peripheral vascular disease    Rheumatic fever    Rheumatoid arteritis (HCC)    Sleep apnea    Past Surgical History:  Procedure Laterality Date   APPENDECTOMY  1955   CARDIOVERSION N/A 02/25/2023   Procedure: CARDIOVERSION;  Surgeon: Lonni Slain, MD;  Location: [facility] INVASIVE CV LAB;  Service: Cardiovascular;  Laterality: N/A;   CHOLECYSTECTOMY  2006   COLONOSCOPY  2023   DILATATION & CURETTAGE/HYSTEROSCOPY WITH MYOSURE N/A 04/15/2022   Procedure: DILATATION & CURETTAGE/HYSTEROSCOPY WITH MYOSURE;  Surgeon: Rosalva Sawyer, MD;  Location: [facility] OR;  Service: Gynecology;  Laterality: N/A;   ENDARTERECTOMY Left 09/27/2019   Procedure: ENDARTERECTOMY CAROTID LEFT;  Surgeon: Serene Gaile ORN, MD;  Location: [facility] OR;  Service: Vascular;  Laterality: Left;   ENDARTERECTOMY Right 08/18/2023   Procedure: RIGHT CAROTID ENDARTERECTOMY WITH PATCH ANGIOPLASTY;  Surgeon: Serene Gaile ORN, MD;  Location: [facility] OR;  Service: Vascular;  Laterality: Right;   HERNIA REPAIR  2007   incisional hernia after cholecystecomy   HERNIA  REPAIR  2015   incisional hernia   INGUINAL HERNIA REPAIR Right 11/17/2023   Procedure: RIGHT INGUINAL HERNIA REPAIR;  Surgeon: Belinda Cough, MD;  Location: [facility] OR;  Service: General;  Laterality: Right;  GEN AND TAP BLOCK   INSERTION OF MESH Right 11/17/2023   Procedure: INSERTION OF MESH;  Surgeon: Belinda Cough, MD;  Location: [facility] OR;  Service: General;  Laterality: Right;   PATCH ANGIOPLASTY Left 09/27/2019   Procedure: Patch Angioplasty Using George;  Surgeon: Serene Gaile ORN, MD;  Location: [facility] OR;  Service: Vascular;  Laterality: Left;   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   TONSILLECTOMY  1963   removed at 78 years old    Allergies  Allergies  Allergen Reactions   Hydrochlorothiazide  Other (See Comments)    Should not be used in patients on Tikosyn .    Penicillins Anaphylaxis and Itching    Did it involve swelling of the face/tongue/throat, SOB, or low BP? Yes Did it involve sudden or severe rash/hives, skin peeling, or any reaction on the inside of your mouth or nose? No Did you need to seek medical attention at a hospital or doctor's office? Yes When did it last happen?      Childhood allergy If all above answers are "NO", may proceed with cephalosporin use.    Mite (D. Farinae) Swelling    Other reaction(s): sinus swelling (dust mites)     Labs/Other Studies Reviewed  The following studies were reviewed today:  Cardiac Studies & Procedures   ______________________________________________________________________________________________     ECHOCARDIOGRAM  ECHOCARDIOGRAM COMPLETE 11/04/2023  Narrative ECHOCARDIOGRAM REPORT    Patient Name:   Kristina [name] Date of Exam: 11/04/2023 Medical Rec #:  [id number]         Height:       62.0 in Accession #:    [id number]        Weight:       235.0 lb Date of Birth:  1946/06/26          BSA:          2.047 m Patient Age:    77 years          BP:           162/82 mmHg Patient Gender: F                 HR:            58 bpm. Exam Location:  [address]  Procedure: 2D Echo, 3D Echo, Cardiac Doppler, Color Doppler and Strain Analysis  Indications:    Aortic stenosis I35.0  History:        Patient has prior history of Echocardiogram examinations, most recent 10/23/2022. Arrythmias:Atrial Fibrillation; Risk Factors:Hypertension. Rheumatic Fever.  Sonographer:    Lauraine Pilot RDCS Referring Phys: [phone] Dylyn [facility]aren C Lamiyah Schlotter   Sonographer Comments: Global longitudinal strain was attempted. IMPRESSIONS   1. Left ventricular ejection fraction, by estimation, is 60 to 65%. The left ventricle has normal function. The left ventricle has no regional wall motion abnormalities. Left ventricular diastolic parameters are consistent with Grade I diastolic dysfunction (impaired relaxation). GLS -15.8%. 2. Right ventricular systolic function is normal. The right ventricular size is normal. There is normal pulmonary artery systolic pressure. 3. Left atrial size was moderately dilated. 4. The mitral valve is normal in structure. Trivial mitral valve regurgitation. No evidence of mitral stenosis. 5. The aortic valve is severely calcified. Aortic valve regurgitation is not visualized. Mild to moderate aortic valve stenosis. Aortic valve area, by VTI measures 1.26 cm. Aortic valve mean gradient measures 19.0 mmHg. SVi of 44ml/m2. 6. The inferior vena cava is normal in size with greater than 50% respiratory variability, suggesting right atrial pressure of 3 mmHg.  FINDINGS Left Ventricle: Left ventricular ejection fraction, by estimation, is 60 to 65%. The left ventricle has normal function. The left ventricle has no regional wall motion abnormalities. The left ventricular internal cavity size was normal in size. There is no left ventricular hypertrophy. Left ventricular diastolic parameters are consistent with Grade I diastolic dysfunction (impaired relaxation).  Right Ventricle: The right ventricular size is normal. No  increase in right ventricular wall thickness. Right ventricular systolic function is normal. There is normal pulmonary artery systolic pressure. The tricuspid regurgitant velocity is 2.37 m/s, and with an assumed right atrial pressure of 3 mmHg, the estimated right ventricular systolic pressure is 25.5 mmHg.  Left Atrium: Left atrial size was moderately dilated.  Right Atrium: Right atrial size was normal in size.  Pericardium: There is no evidence of pericardial effusion.  Mitral Valve: The mitral valve is normal in structure. Trivial mitral valve regurgitation. No evidence of mitral valve stenosis.  Tricuspid Valve: The tricuspid valve is normal in structure. Tricuspid valve regurgitation is not demonstrated. No evidence of tricuspid stenosis.  Aortic Valve: The aortic valve is calcified. There is moderate aortic valve annular calcification. Aortic valve regurgitation is not visualized. Mild to moderate  aortic stenosis is present. Aortic valve mean gradient measures 19.0 mmHg. Aortic valve peak gradient measures 31.2 mmHg. Aortic valve area, by VTI measures 1.26 cm.  Pulmonic Valve: The pulmonic valve was normal in structure. Pulmonic valve regurgitation is not visualized. No evidence of pulmonic stenosis.  Aorta: The aortic root is normal in size and structure.  Venous: The inferior vena cava is normal in size with greater than 50% respiratory variability, suggesting right atrial pressure of 3 mmHg.  IAS/Shunts: No atrial level shunt detected by color flow Doppler.   LEFT VENTRICLE PLAX 2D LVIDd:         5.75 cm   Diastology LVIDs:         3.79 cm   LV e' medial:    6.68 cm/s LV PW:         0.97 cm   LV E/e' medial:  14.4 LV IVS:        0.97 cm   LV e' lateral:   10.40 cm/s LVOT diam:     2.10 cm   LV E/e' lateral: 9.2 LV SV:         91 LV SV Index:   44 LVOT Area:     3.46 cm  3D Volume EF: 3D EF:        58 % LV EDV:       160 ml LV ESV:       68 ml LV SV:        92  ml  RIGHT VENTRICLE RV S prime:     12.90 cm/s TAPSE (M-mode): 2.5 cm  LEFT ATRIUM             Index        RIGHT ATRIUM           Index LA diam:        5.57 cm 2.72 cm/m   RA Area:     15.50 cm LA Vol (A2C):   96.4 ml 47.09 ml/m  RA Volume:   35.70 ml  17.44 ml/m LA Vol (A4C):   93.1 ml 45.47 ml/m LA Biplane Vol: 95.6 ml 46.70 ml/m AORTIC VALVE AV Area (Vmax):    1.20 cm AV Area (Vmean):   1.12 cm AV Area (VTI):     1.26 cm AV Vmax:           279.50 cm/s AV Vmean:          201.250 cm/s AV VTI:            0.718 m AV Peak Grad:      31.2 mmHg AV Mean Grad:      19.0 mmHg LVOT Vmax:         96.70 cm/s LVOT Vmean:        65.100 cm/s LVOT VTI:          0.262 m LVOT/AV VTI ratio: 0.37  AORTA Ao Root diam: 3.31 cm Ao Asc diam:  3.59 cm  MITRAL VALVE                TRICUSPID VALVE MV Area (PHT): 2.26 cm     TR Peak grad:   22.5 mmHg MV Decel Time: 335 msec     TR Vmax:        237.00 cm/s MR Peak grad: 102.4 mmHg MR Vmax:      506.00 cm/s   SHUNTS MV E velocity: 96.20 cm/s   Systemic VTI:  0.26 m MV A velocity: 118.00 cm/s  Systemic Diam: 2.10 cm MV E/A ratio:  0.82  Aditya Sabharwal Electronically signed by Ria Commander Signature Date/Time: 11/04/2023/11:45:21 AM    Final          ______________________________________________________________________________________________     Recent Labs: 08/19/2023: Magnesium  2.0 08/20/2023: ALT 26 11/11/2023: BUN 11; Creatinine, Ser 0.59; Hemoglobin 11.4; Platelets 283; Potassium 4.7; Sodium 141  Recent Lipid Panel    Component Value Date/Time   CHOL 125 08/19/2023 0345   CHOL 169 09/23/2021 0858   TRIG 57 08/19/2023 0345   HDL 59 08/19/2023 0345   HDL 66 09/23/2021 0858   CHOLHDL 2.1 08/19/2023 0345   VLDL 11 08/19/2023 0345   LDLCALC 55 08/19/2023 0345   LDLCALC 85 09/23/2021 0858    History of Present Illness    78 year old female with the above past medical history including paroxysmal atrial  fibrillation, aortic stenosis, carotid artery disease, hypertension, and hyperlipidemia.   Echocardiogram in 09/2021 showed normal LV systolic function, G1 DD, mild aortic stenosis.  Carotid Dopplers at the time revealed moderate R ICA and high-grade LICA stenosis.  She was referred to vascular surgery.  Repeat echocardiogram in 10/2022 showed EF 60 to 65%, normal LV function, no RWMA, mild LVH, G1 DD, normal RV systolic function, mild mitral valve regurgitation, moderate aortic stenosis, mean gradient 19 mmHg, increased from prior.  Repeat echocardiogram was recommended in 1 year.  Most recent carotid ultrasound in 10/2022 showed 80 to 99% R ICA stenosis, 1 to 39% LICA stenosis.  She was asymptomatic.  Follow-up CTA of the head and neck was recommended in 6 months.  She saw her PCP in 01/2023 who identified new atrial fibrillation with RVR. She was started on Eliquis  and metoprolol . She underwent successful DCCV on 02/25/2023 with restoration of sinus rhythm.  Unfortunately, days later she had  early recurrence of atrial fibrillation, which prompted ED visit.  She was referred to the A-fib clinic/EP and ultimately underwent Tikosyn  load in 06/2023.  Metoprolol  was discontinued at the time.  She underwent carotid endarterectomy in 08/2023.  She was readmitted in 08/2023 in the setting of hematoma to her R CEA surgical site. Repeat echocardiogram in 10/2023 showed EF 60 to 65%, normal LV function, G1 DD, normal RV systolic function, mild to moderate aortic valve stenosis, mean gradient 19 mmHg. She was last seen in the office on 01/12/2024 and was stable from a cardiac standpoint.  She was exercising regularly.  She denied any breakthrough atrial fibrillation.  She presents today for follow-up.  Since her last visit she has done well from a cardiac standpoint.  She denies any chest pain, palpitations, dizziness, dyspnea, edema, PND, orthopnea, weight gain.  In the past she has had several notifications of atrial  fibrillation on her Apple watch.  She has been asymptomatic with this.  She is exercising regularly, up to 4 days a week, and tolerating this well.  She is pending repeat sleep study per PCP.  She has not been using her CPAP consistently. Overall, she reports feeling well.   Home Medications    Current Outpatient Medications  Medication Sig Dispense Refill   apixaban  (ELIQUIS ) 5 MG TABS tablet Take 1 tablet (5 mg total) by mouth 2 (two) times daily. Pt. Was given samples from her PCP enough to last one month 180 tablet 3   aspirin  EC 81 MG tablet Take 1 tablet (81 mg total) by mouth daily at 6 (six) AM. Swallow whole. 30 tablet 12   atorvastatin  (LIPITOR) 20 MG tablet  Take 20 mg by mouth every morning.     cyanocobalamin  (VITAMIN B12) 1000 MCG tablet Take 1,000 mcg by mouth in the morning.     dofetilide  (TIKOSYN ) 500 MCG capsule TAKE 1 CAPSULE BY MOUTH TWICE  DAILY 180 capsule 0   dorzolamide -timolol  (COSOPT ) 22.3-6.8 MG/ML ophthalmic solution Place 1 drop into both eyes 2 (two) times daily. 10 mL 4   ELDERBERRY PO Take 2 tablets by mouth in the morning. Airborne Immune gummy     ferrous sulfate  325 (65 FE) MG EC tablet Take 325 mg by mouth every Monday.     GLUCOSAMINE-CHONDROITIN PO Take 1 tablet by mouth daily.     losartan  (COZAAR ) 100 MG tablet Take 100 mg by mouth in the morning.     Multiple Vitamin (MULTIVITAMIN) capsule Take 1 capsule by mouth daily. Vita fusion     Multiple Vitamins-Minerals (AIRBORNE GUMMIES PO) Take 3 tablets by mouth in the morning.     Vitamin D , Ergocalciferol , (DRISDOL ) 1.25 MG (50000 UNIT) CAPS capsule Take 1 capsule (50,000 Units total) by mouth every 7 (seven) days. (Patient taking differently: Take 50,000 Units by mouth every Monday.) 4 capsule 0   furosemide  (LASIX ) 40 MG tablet Take 1 tablet (40 mg total) by mouth daily. 90 tablet 3   No current facility-administered medications for this visit.     Review of Systems    She denies chest pain,  palpitations, dyspnea, pnd, orthopnea, n, v, dizziness, syncope, edema, weight gain, or early satiety. All other systems reviewed and are otherwise negative except as noted above.   Physical Exam    VS:  BP 113/71   Pulse 60   Ht 5' 3 (1.6 m)   Wt 238 lb 6.4 oz (108.1 kg)   SpO2 96%   BMI 42.23 kg/m   GEN: Well nourished, well developed, in no acute distress. HEENT: normal. Neck: Supple, no JVD, carotid bruits, or masses. Cardiac: RRR, 2/6 murmur, no rubs, or gallops. No clubbing, cyanosis, edema.  Radials/DP/PT 2+ and equal bilaterally.  Respiratory:  Respirations regular and unlabored, clear to auscultation bilaterally. GI: Soft, nontender, nondistended, BS + x 4. MS: no deformity or atrophy. Skin: warm and dry, no rash. Neuro:  Strength and sensation are intact. Psych: Normal affect.  Accessory Clinical Findings    ECG personally reviewed by me today - EKG Interpretation Date/Time:  Monday July 10 2024 13:54:22 EDT Ventricular Rate:  60 PR Interval:  196 QRS Duration:  92 QT Interval:  444 QTC Calculation: 444 R Axis:   45  Text Interpretation: Sinus rhythm with Premature atrial complexes When compared with ECG of 12-Jan-2024 09:54, Premature atrial complexes are now Present Confirmed by Daneen Perkins ([id number]) on 07/10/2024 2:09:43 PM  - no acute changes.   Lab Results  Component Value Date   WBC 4.8 11/11/2023   HGB 11.4 (L) 11/11/2023   HCT 38.3 11/11/2023   MCV 67.0 (L) 11/11/2023   PLT 283 11/11/2023   Lab Results  Component Value Date   CREATININE 0.59 11/11/2023   BUN 11 11/11/2023   NA 141 11/11/2023   K 4.7 11/11/2023   CL 101 11/11/2023   CO2 30 11/11/2023   Lab Results  Component Value Date   ALT 26 08/20/2023   AST 22 08/20/2023   ALKPHOS 54 08/20/2023   BILITOT 0.9 08/20/2023   Lab Results  Component Value Date   CHOL 125 08/19/2023   HDL 59 08/19/2023   LDLCALC 55 08/19/2023   TRIG  57 08/19/2023   CHOLHDL 2.1 08/19/2023    Lab  Results  Component Value Date   HGBA1C 5.6 01/27/2022    Assessment & Plan    1.  Paroxysmal atrial fibrillation: S/p DCCV on 02/25/2023 with early recurrence of atrial fibrillation. S/p Tikosyn  load per EP.  Maintaining sinus rhythm, QT is stable.  She has had several notifications on her Apple Watch recently of atrial fibrillation.  EKG today shows sinus rhythm with frequent PACs.  She is asymptomatic.  Denies bleeding on Eliquis .  Will check labs including CBC, CMET, TSH and magnesium  today.  Recommend follow-up with A-fib clinic.  Reviewed ED precautions.  Continue Tikosyn , Eliquis .   2. Aortic stenosis/bilateral lower extremity edema: Most recent echocardiogram in 10/2023 showed EF 60 to 65%, normal LV function, G1 DD, normal RV systolic function, mild to moderate aortic valve stenosis, mean gradient 19 mmHg, stable compared to prior.  Euvolemic and well compensated on exam.  She does have a murmur on exam today.  Will plan for repeat echo 10/2024.  Continue Lasix .   3. Carotid artery disease:  S/p R CEA in 08/2023.  Pending repeat carotid ultrasound today.  Following with vascular surgery. Continue ASA, Lipitor.    4. Hypertension: BP well controlled. Continue current antihypertensive regimen.    5. Hyperlipidemia: LDL was 55 in 08/2023.  Will update fasting lipids, CMET.  Continue Lipitor.   6. OSA: She reports recent intermittent adherence to CPAP.  She is pending repeat sleep study per her PCP.   7. Disposition: Follow-up in a month with A-fib clinic.  Follow-up in 6 months with Dr. Court or Damien Braver, NP.       Damien JAYSON Braver, NP 07/10/2024, 2:48 PM

## 2024-07-10 NOTE — Telephone Encounter (Signed)
 Pt was seen in office and will need an echo January 2026. Order placed, please arrange.

## 2024-07-17 ENCOUNTER — Ambulatory Visit: Attending: Surgery | Admitting: Surgery

## 2024-07-20 DIAGNOSIS — I480 Paroxysmal atrial fibrillation: Secondary | ICD-10-CM | POA: Diagnosis not present

## 2024-07-20 LAB — CBC

## 2024-07-21 ENCOUNTER — Ambulatory Visit: Payer: Self-pay | Admitting: Nurse Practitioner

## 2024-07-21 LAB — COMPREHENSIVE METABOLIC PANEL WITH GFR
ALT: 24 IU/L (ref 0–32)
AST: 23 IU/L (ref 0–40)
Albumin: 4.1 g/dL (ref 3.8–4.8)
Alkaline Phosphatase: 75 IU/L (ref 49–135)
BUN/Creatinine Ratio: 25 (ref 12–28)
BUN: 18 mg/dL (ref 8–27)
Bilirubin Total: 0.6 mg/dL (ref 0.0–1.2)
CO2: 24 mmol/L (ref 20–29)
Calcium: 9.3 mg/dL (ref 8.7–10.3)
Chloride: 103 mmol/L (ref 96–106)
Creatinine, Ser: 0.72 mg/dL (ref 0.57–1.00)
Globulin, Total: 2.5 g/dL (ref 1.5–4.5)
Glucose: 87 mg/dL (ref 70–99)
Potassium: 4.6 mmol/L (ref 3.5–5.2)
Sodium: 142 mmol/L (ref 134–144)
Total Protein: 6.6 g/dL (ref 6.0–8.5)
eGFR: 86 mL/min/1.73 (ref >59)

## 2024-07-21 LAB — CBC
Hematocrit: 40.3 % (ref 34.0–46.6)
Hemoglobin: 11.9 g/dL (ref 11.1–15.9)
MCH: 20.8 pg — ABNORMAL LOW (ref 26.6–33.0)
MCHC: 29.5 g/dL — ABNORMAL LOW (ref 31.5–35.7)
MCV: 70 fL — ABNORMAL LOW (ref 79–97)
Platelets: 258 x10E3/uL (ref 150–450)
RBC: 5.73 x10E6/uL — ABNORMAL HIGH (ref 3.77–5.28)
RDW: 16.6 % — ABNORMAL HIGH (ref 11.7–15.4)
WBC: 5.1 x10E3/uL (ref 3.4–10.8)

## 2024-07-21 LAB — LIPID PANEL
Chol/HDL Ratio: 2 ratio (ref 0.0–4.4)
Cholesterol, Total: 128 mg/dL (ref 100–199)
HDL: 64 mg/dL (ref >39)
LDL Chol Calc (NIH): 49 mg/dL (ref 0–99)
Triglycerides: 73 mg/dL (ref 0–149)
VLDL Cholesterol Cal: 15 mg/dL (ref 5–40)

## 2024-07-21 LAB — TSH: TSH: 2.39 u[IU]/mL (ref 0.450–4.500)

## 2024-07-24 ENCOUNTER — Ambulatory Visit: Admitting: Surgery

## 2024-07-24 ENCOUNTER — Encounter (HOSPITAL_COMMUNITY)

## 2024-08-10 ENCOUNTER — Ambulatory Visit (HOSPITAL_COMMUNITY): Admitting: Physician Assistant

## 2024-08-17 ENCOUNTER — Ambulatory Visit (HOSPITAL_COMMUNITY): Admitting: Physician Assistant

## 2024-08-21 ENCOUNTER — Encounter: Payer: Self-pay | Admitting: Surgery

## 2024-08-21 ENCOUNTER — Ambulatory Visit: Attending: Surgery | Admitting: Surgery

## 2024-08-21 VITALS — BP 142/83 | HR 63 | Temp 98.1°F | Ht 63.0 in | Wt 239.0 lb

## 2024-08-21 DIAGNOSIS — I6523 Occlusion and stenosis of bilateral carotid arteries: Secondary | ICD-10-CM | POA: Diagnosis not present

## 2024-08-21 NOTE — Progress Notes (Signed)
 [facility] [place]  Patient name: Kristina [name] MRN: [id number] DOB: [birth year] Sex: female   REASON FOR VISIT:    Follow up  HISOTRY OF PRESENT ILLNESS:    Kristina [name] is a 78 y.o. female who has undergone the following procedures:  09/27/2019 left carotid endarterectomy (symptomatic) 08/18/2023: Right carotid endarterectomy (asymptomatic)  She is back today for follow-up.  She has no complaints.  She had a slight marginal mandibular neuropraxia that has resolved.  She denies any numbness or weakness in either extremity.  She denies slurred speech.  She denies amaurosis fugax.   She is medically managed for hypertension with an ARB.  She takes a statin for hypercholesterolemia.  She is on aspirin .  She is a former smoker.   PAST MEDICAL HISTORY:   Past Medical History:  Diagnosis Date   Allergic rhinitis    Anemia glaucoma   Anxiety    Atrial fibrillation (HCC)    Back pain    Carotid arterial disease    Cataract    Mixed OU   DDD (degenerative disc disease), lumbar    Dysrhythmia    A. Fib   Family history of adverse reaction to anesthesia    daughter has PONV   Heart murmur    ECHO 10/23/22   High blood pressure    High cholesterol    History of stomach ulcers    Hypertensive retinopathy    OU   Peripheral vascular disease    Rheumatic fever    Rheumatoid arteritis (HCC)    Sleep apnea      FAMILY HISTORY:   Family History  Problem Relation Age of Onset   Kidney disease Mother    Hypertension Mother    Glaucoma Mother    Heart failure Father    Emphysema Father    Asthma Father    Hypertension Father    Heart disease Father    COPD Brother    Allergic rhinitis Neg Hx    Angioedema Neg Hx    Atopy Neg Hx    Eczema Neg Hx    Immunodeficiency Neg Hx    Urticaria Neg Hx    BRCA 1/2 Neg Hx    Breast cancer Neg Hx     SOCIAL HISTORY:   Social History   Tobacco Use    Smoking status: Former    Current packs/day: 0.00    Average packs/day: 1.5 packs/day for 30.0 years (45.0 ttl pk-yrs)    Types: Cigarettes    Start date: 63    Quit date: 2009    Years since quitting: 16.8   Smokeless tobacco: Never   Tobacco comments:    Former smoker 06/28/23  Substance Use Topics   Alcohol use: Yes    Alcohol/week: 2.0 - 3.0 standard drinks of alcohol    Types: 2 - 3 Glasses of wine per week     ALLERGIES:   Allergies  Allergen Reactions   Hydrochlorothiazide  Other (See Comments)    Should not be used in patients on Tikosyn .    Penicillins Anaphylaxis and Itching    Did it involve swelling of the face/tongue/throat, SOB, or low BP? Yes Did it involve sudden or severe rash/hives, skin peeling, or any reaction on the inside of your mouth or nose? No Did you need to seek medical attention at a hospital or doctor's office? Yes When did it last happen?      Childhood allergy If all above answers are "NO", may proceed  with cephalosporin use.    Mite (D. Farinae) Swelling    Other reaction(s): sinus swelling (dust mites)     CURRENT MEDICATIONS:   Current Outpatient Medications  Medication Sig Dispense Refill   apixaban  (ELIQUIS ) 5 MG TABS tablet Take 1 tablet (5 mg total) by mouth 2 (two) times daily. Pt. Was given samples from her PCP enough to last one month 180 tablet 3   aspirin  EC 81 MG tablet Take 1 tablet (81 mg total) by mouth daily at 6 (six) AM. Swallow whole. 30 tablet 12   atorvastatin  (LIPITOR) 20 MG tablet Take 20 mg by mouth every morning.     cyanocobalamin  (VITAMIN B12) 1000 MCG tablet Take 1,000 mcg by mouth in the morning.     dofetilide  (TIKOSYN ) 500 MCG capsule TAKE 1 CAPSULE BY MOUTH TWICE  DAILY 180 capsule 0   dorzolamide -timolol  (COSOPT ) 22.3-6.8 MG/ML ophthalmic solution Place 1 drop into both eyes 2 (two) times daily. 10 mL 4   ELDERBERRY PO Take 2 tablets by mouth in the morning. Airborne Immune gummy     ferrous sulfate  325  (65 FE) MG EC tablet Take 325 mg by mouth every Monday.     furosemide  (LASIX ) 40 MG tablet Take 1 tablet (40 mg total) by mouth daily. 90 tablet 3   GLUCOSAMINE-CHONDROITIN PO Take 1 tablet by mouth daily.     losartan  (COZAAR ) 100 MG tablet Take 100 mg by mouth in the morning.     Multiple Vitamin (MULTIVITAMIN) capsule Take 1 capsule by mouth daily. Vita fusion     Multiple Vitamins-Minerals (AIRBORNE GUMMIES PO) Take 3 tablets by mouth in the morning.     Vitamin D , Ergocalciferol , (DRISDOL ) 1.25 MG (50000 UNIT) CAPS capsule Take 1 capsule (50,000 Units total) by mouth every 7 (seven) days. (Patient taking differently: Take 50,000 Units by mouth every Monday.) 4 capsule 0   No current facility-administered medications for this visit.    REVIEW OF SYSTEMS:   [X]  denotes positive finding, [ ]  denotes negative finding Cardiac  Comments:  Chest pain or chest pressure:    Shortness of breath upon exertion:    Short of breath when lying flat:    Irregular heart rhythm:        Vascular    Pain in calf, thigh, or hip brought on by ambulation:    Pain in feet at night that wakes you up from your sleep:     Blood clot in your veins:    Leg swelling:         Pulmonary    Oxygen at home:    Productive cough:     Wheezing:         Neurologic    Sudden weakness in arms or legs:     Sudden numbness in arms or legs:     Sudden onset of difficulty speaking or slurred speech:    Temporary loss of vision in one eye:     Problems with dizziness:         Gastrointestinal    Blood in stool:     Vomited blood:         Genitourinary    Burning when urinating:     Blood in urine:        Psychiatric    Major depression:         Hematologic    Bleeding problems:    Problems with blood clotting too easily:        Skin  Rashes or ulcers:        Constitutional    Fever or chills:      PHYSICAL EXAM:   Vitals:   08/21/24 0912 08/21/24 0914  BP: (!) 173/92 (!) 142/83  Pulse: 63    Temp: 98.1 F (36.7 C)   SpO2: 92%   Weight: 239 lb (108.4 kg)   Height: 5' 3 (1.6 m)     GENERAL: The patient is a well-nourished female, in no acute distress. The vital signs are documented above. CARDIAC: There is a regular rate and rhythm. PULMONARY: Non-labored respirations MUSCULOSKELETAL: There are no major deformities or cyanosis. NEUROLOGIC: No focal weakness or paresthesias are detected. SKIN: There are no ulcers or rashes noted. PSYCHIATRIC: The patient has a normal affect.  STUDIES:   I have reviewed the following carotid studies:  Right Carotid: Patent CEA with no stenosis.   Left Carotid: Patent CEA with no stenosis.   Vertebrals:  Bilateral vertebral arteries demonstrate antegrade flow.  Subclavians: Normal flow hemodynamics were seen in bilateral subclavian               arteries.   MEDICAL ISSUES:   Carotid: Surveillance imaging shows no recurrent stenosis on either side.  She will return in 1 year for surveillance imaging    Malvina New, IV, MD, FACS [facility] [phone] Pager [phone]

## 2024-08-28 ENCOUNTER — Ambulatory Visit (HOSPITAL_COMMUNITY)
Admission: RE | Admit: 2024-08-28 | Discharge: 2024-08-28 | Disposition: A | Discharge status: Home / Self Care | Source: Ambulatory Visit | Attending: Physician Assistant | Admitting: Physician Assistant

## 2024-08-28 VITALS — BP 172/72 | HR 56 | Ht 63.0 in | Wt 237.0 lb

## 2024-08-28 DIAGNOSIS — I4819 Other persistent atrial fibrillation: Secondary | ICD-10-CM

## 2024-08-28 DIAGNOSIS — I4891 Unspecified atrial fibrillation: Secondary | ICD-10-CM | POA: Diagnosis not present

## 2024-08-28 DIAGNOSIS — Z79899 Other long term (current) drug therapy: Secondary | ICD-10-CM | POA: Diagnosis not present

## 2024-08-28 DIAGNOSIS — D6869 Other thrombophilia: Secondary | ICD-10-CM | POA: Diagnosis not present

## 2024-08-28 DIAGNOSIS — Z5181 Encounter for therapeutic drug level monitoring: Secondary | ICD-10-CM

## 2024-08-28 NOTE — Progress Notes (Signed)
 Primary Care Physician: Cleotilde Planas, MD Primary Cardiologist: Dr. Court Primary Electrophysiologist: Dr Nancey  Referring Physician: Damien Braver, NP   Kristina [name] is a 78 y.o. female with a history of aortic stenosis, aortic atherosclerosis, carotid artery disease, HTN, HLD, OSA no longer on CPAP, and persistent atrial fibrillation who presents for consultation in the [facility]. She underwent successful DCCV on 02/25/23 with ERAF several days later. Patient went to ED on 5/26 due to Afib with HR 101. Patient is on Eliquis  for stroke prevention. Patient is s/p dofetilide  loading 9/23-9/26/24. The patient converted chemically and did not require cardioversion. The patient had significant post conversion pause of 9 seconds resulting in syncope and subsequent sinus bradycardia in the 40s. This resolved with holding her BB.   Patient returns for follow up for atrial fibrillation and dofetilide  monitoring. She remains in SR today and feels well from a cardiac standpoint. She has been dealing with an URI for the past two weeks, finally feeling better. Her Apple watch has indicated some afib, personally reviewed and shows SR with PACs. No bleeding issues on anticoagulation.   Today, she  denies symptoms of palpitations, chest pain, shortness of breath, orthopnea, PND, lower extremity edema, dizziness, presyncope, syncope, bleeding, or neurologic sequela. The patient is tolerating medications without difficulties and is otherwise without complaint today.     Atrial Fibrillation Management history:  Previous antiarrhythmic drugs: None Previous cardioversions: 02/25/23 Previous ablations: None Anticoagulation history: Eliquis  5 mg BID   Past Medical History:  Diagnosis Date   Allergic rhinitis    Anemia glaucoma   Anxiety    Atrial fibrillation (HCC)    Back pain    Carotid arterial disease    Cataract    Mixed OU   DDD (degenerative disc disease), lumbar     Dysrhythmia    A. Fib   Family history of adverse reaction to anesthesia    daughter has PONV   Heart murmur    ECHO 10/23/22   High blood pressure    High cholesterol    History of stomach ulcers    Hypertensive retinopathy    OU   Peripheral vascular disease    Rheumatic fever    Rheumatoid arteritis (HCC)    Sleep apnea     Current Outpatient Medications  Medication Sig Dispense Refill   apixaban  (ELIQUIS ) 5 MG TABS tablet Take 1 tablet (5 mg total) by mouth 2 (two) times daily. Pt. Was given samples from her PCP enough to last one month 180 tablet 3   aspirin  EC 81 MG tablet Take 1 tablet (81 mg total) by mouth daily at 6 (six) AM. Swallow whole. 30 tablet 12   atorvastatin  (LIPITOR) 20 MG tablet Take 20 mg by mouth every morning.     cyanocobalamin  (VITAMIN B12) 1000 MCG tablet Take 1,000 mcg by mouth in the morning.     dofetilide  (TIKOSYN ) 500 MCG capsule TAKE 1 CAPSULE BY MOUTH TWICE  DAILY 180 capsule 0   dorzolamide -timolol  (COSOPT ) 22.3-6.8 MG/ML ophthalmic solution Place 1 drop into both eyes 2 (two) times daily. 10 mL 4   ELDERBERRY PO Take 2 tablets by mouth in the morning. Airborne Immune gummy     ferrous sulfate  325 (65 FE) MG EC tablet Take 325 mg by mouth every Monday.     furosemide  (LASIX ) 40 MG tablet Take 1 tablet (40 mg total) by mouth daily. 90 tablet 3   GLUCOSAMINE-CHONDROITIN PO Take 1 tablet by  mouth daily.     losartan  (COZAAR ) 100 MG tablet Take 100 mg by mouth in the morning.     Multiple Vitamin (MULTIVITAMIN) capsule Take 1 capsule by mouth daily. Vita fusion     Multiple Vitamins-Minerals (AIRBORNE GUMMIES PO) Take 3 tablets by mouth in the morning.     Vitamin D , Ergocalciferol , (DRISDOL ) 1.25 MG (50000 UNIT) CAPS capsule Take 1 capsule (50,000 Units total) by mouth every 7 (seven) days. 4 capsule 0   No current facility-administered medications for this encounter.    ROS- All systems are reviewed and negative except as per the HPI  above.  Physical Exam: Vitals:   08/28/24 1029  BP: (!) 172/72  Pulse: (!) 56  Weight: 107.5 kg  Height: 5' 3 (1.6 m)    GEN: Well nourished, well developed in no acute distress CARDIAC: Regular rate and rhythm with occasional ectopy, no rubs, gallops. 2/6 systolic murmur  RESPIRATORY:  Clear to auscultation without rales, wheezing or rhonchi  ABDOMEN: Soft, non-tender, non-distended EXTREMITIES:  No edema; No deformity    Wt Readings from Last 3 Encounters:  08/28/24 107.5 kg  08/21/24 108.4 kg  07/10/24 108.1 kg    EKG Interpretation Date/Time:  Monday August 28 2024 10:37:43 EST Ventricular Rate:  56 PR Interval:  212 QRS Duration:  90 QT Interval:  448 QTC Calculation: 432 R Axis:   50  Text Interpretation: Sinus bradycardia with 1st degree A-V block with Premature atrial complexes Otherwise normal ECG When compared with ECG of 10-Jul-2024 13:54, No significant change was found Confirmed by Hermena Swint ([id number]) on 08/28/2024 11:07:11 AM     Echo 11/04/23 demonstrated:  1. Left ventricular ejection fraction, by estimation, is 60 to 65%. The  left ventricle has normal function. The left ventricle has no regional  wall motion abnormalities. Left ventricular diastolic parameters are  consistent with Grade I diastolic dysfunction (impaired relaxation). GLS -15.8%.   2. Right ventricular systolic function is normal. The right ventricular  size is normal. There is normal pulmonary artery systolic pressure.   3. Left atrial size was moderately dilated.   4. The mitral valve is normal in structure. Trivial mitral valve  regurgitation. No evidence of mitral stenosis.   5. The aortic valve is severely calcified. Aortic valve regurgitation is  not visualized. Mild to moderate aortic valve stenosis. Aortic valve area,  by VTI measures 1.26 cm. Aortic valve mean gradient measures 19.0 mmHg.  SVi of 44ml/m2.   6. The inferior vena cava is normal in size with greater than 50%   respiratory variability, suggesting right atrial pressure of 3 mmHg.    Epic records are reviewed at length today.   CHA2DS2-VASc Score = 5  [name] score is based upon: CHF History: 0 HTN History: 1 Diabetes History: 0 Stroke History: 0 Vascular Disease History: 1 Age Score: 2 Gender Score: 1       ASSESSMENT AND PLAN: Persistent Atrial Fibrillation (ICD10:  I48.19) [name] CHA2DS2-VASc score is 5, indicating a 7.2% annual risk of stroke.   S/p dofetilide  admission 06/2023 Patient appears to be maintaining SR. Apple Watch strips reviewed and appear consistent with PACs. Patient feels well and is happy with present therapy.  Continue dofetilide  500 mcg BID Continue Eliquis  5 mg BID  Secondary Hypercoagulable State (ICD10:  D68.69) The patient is at significant risk for stroke/thromboembolism based upon her CHA2DS2-VASc Score of 5.  Continue Apixaban  (Eliquis ). No bleeding issues.   High Risk Medication Monitoring (ICD 10:  S20.100) Patient requires ongoing monitoring for anti-arrhythmic medication which has the potential to cause life threatening arrhythmias. QT interval on ECG acceptable for dofetilide  monitoring. Check magnesium  today. Recent cmet reviewed.   Obesity Body mass index is 41.98 kg/m.  Encouraged lifestyle modification  OSA  Encouraged nightly CPAP   Follow up in the AF clinic in 6 months.    Daril Kicks PA-C [facility] [address] [place], [place] [address] [phone] 08/28/2024 11:07 AM

## 2024-08-29 ENCOUNTER — Ambulatory Visit (HOSPITAL_COMMUNITY): Payer: Self-pay | Admitting: Physician Assistant

## 2024-08-29 LAB — MAGNESIUM: Magnesium: 2.2 mg/dL (ref 1.6–2.3)

## 2024-09-11 ENCOUNTER — Other Ambulatory Visit: Payer: Self-pay | Admitting: Cardiovascular Disease

## 2024-09-22 NOTE — Progress Notes (Signed)
 Kristina [name]                                          MRN: [id number]   09/22/2024   The [facility]BCI Quality Team Specialist reviewed this patient medical record for the purposes of chart review for care gap closure. The following were reviewed: chart review for care gap closure-kidney health evaluation for diabetes:eGFR  and uACR.    VBCI Quality Team

## 2024-10-16 ENCOUNTER — Other Ambulatory Visit: Payer: Self-pay | Admitting: Cardiology

## 2024-10-16 MED ORDER — APIXABAN 5 MG PO TABS: 5.0000 mg | ORAL_TABLET | Freq: Two times a day (BID) | ORAL | 3 refills | Status: AC

## 2024-10-17 ENCOUNTER — Ambulatory Visit (HOSPITAL_COMMUNITY)
Admission: RE | Admit: 2024-10-17 | Discharge: 2024-10-17 | Disposition: A | Discharge status: Home / Self Care | Source: Ambulatory Visit | Attending: Cardiology | Admitting: Cardiology

## 2024-10-17 DIAGNOSIS — I6523 Occlusion and stenosis of bilateral carotid arteries: Secondary | ICD-10-CM | POA: Insufficient documentation

## 2024-10-17 DIAGNOSIS — I350 Nonrheumatic aortic (valve) stenosis: Secondary | ICD-10-CM

## 2024-10-17 DIAGNOSIS — I480 Paroxysmal atrial fibrillation: Secondary | ICD-10-CM | POA: Insufficient documentation

## 2024-10-17 LAB — ECHOCARDIOGRAM COMPLETE
AR max vel: 0.97 cm2
AV Area VTI: 0.9 cm2
AV Area mean vel: 0.92 cm2
AV Mean grad: 24.2 mmHg
AV Peak grad: 40.4 mmHg
Ao pk vel: 3.18 m/s
Area-P 1/2: 4.79 cm2
MV VTI: 1.86 cm2
S' Lateral: 3.6 cm

## 2024-11-08 ENCOUNTER — Other Ambulatory Visit (HOSPITAL_BASED_OUTPATIENT_CLINIC_OR_DEPARTMENT_OTHER): Payer: Self-pay | Admitting: Orthopedic Surgery

## 2024-11-08 DIAGNOSIS — M19012 Primary osteoarthritis, left shoulder: Secondary | ICD-10-CM
